# Patient Record
Sex: Male | Born: 1950 | Race: White | Hispanic: No | Marital: Married | Smoking: Former smoker
Health system: Southern US, Community
[De-identification: ages and names within clinical notes are randomized; demographics above are authoritative.]

## PROBLEM LIST (undated history)

## (undated) DIAGNOSIS — R51 Headache: Secondary | ICD-10-CM

## (undated) DIAGNOSIS — M51369 Other intervertebral disc degeneration, lumbar region without mention of lumbar back pain or lower extremity pain: Secondary | ICD-10-CM

## (undated) DIAGNOSIS — M5136 Other intervertebral disc degeneration, lumbar region: Secondary | ICD-10-CM

## (undated) DIAGNOSIS — Z8619 Personal history of other infectious and parasitic diseases: Secondary | ICD-10-CM

## (undated) DIAGNOSIS — R350 Frequency of micturition: Secondary | ICD-10-CM

## (undated) DIAGNOSIS — M5030 Other cervical disc degeneration, unspecified cervical region: Secondary | ICD-10-CM

## (undated) DIAGNOSIS — J45909 Unspecified asthma, uncomplicated: Secondary | ICD-10-CM

## (undated) DIAGNOSIS — G4733 Obstructive sleep apnea (adult) (pediatric): Secondary | ICD-10-CM

## (undated) DIAGNOSIS — I10 Essential (primary) hypertension: Secondary | ICD-10-CM

## (undated) DIAGNOSIS — M1990 Unspecified osteoarthritis, unspecified site: Secondary | ICD-10-CM

## (undated) DIAGNOSIS — E162 Hypoglycemia, unspecified: Secondary | ICD-10-CM

## (undated) DIAGNOSIS — H9193 Unspecified hearing loss, bilateral: Secondary | ICD-10-CM

## (undated) DIAGNOSIS — T884XXA Failed or difficult intubation, initial encounter: Secondary | ICD-10-CM

## (undated) DIAGNOSIS — M48061 Spinal stenosis, lumbar region without neurogenic claudication: Secondary | ICD-10-CM

## (undated) DIAGNOSIS — E039 Hypothyroidism, unspecified: Secondary | ICD-10-CM

## (undated) HISTORY — PX: CERVICAL FUSION: SHX112

## (undated) HISTORY — PX: CYSTECTOMY: SUR359

## (undated) HISTORY — PX: VASECTOMY: SHX75

## (undated) HISTORY — PX: PENILE DEBRIDEMENT: SHX2200

## (undated) HISTORY — PX: BACK SURGERY: SHX140

## (undated) HISTORY — PX: SHOULDER ARTHROSCOPY: SHX128

## (undated) HISTORY — PX: LUMBAR FUSION: SHX111

## (undated) HISTORY — PX: KNEE ARTHROSCOPY: SHX127

---

## 1989-05-29 HISTORY — PX: UVULOPALATOPHARYNGOPLASTY (UPPP)/TONSILLECTOMY/SEPTOPLASTY: SHX6164

## 1989-05-29 HISTORY — PX: TONSILLECTOMY: SUR1361

## 1995-09-29 HISTORY — PX: INGUINAL HERNIA REPAIR: SUR1180

## 1998-02-04 ENCOUNTER — Other Ambulatory Visit: Admission: RE | Admit: 1998-02-04 | Discharge: 1998-02-04 | Payer: Self-pay | Admitting: Otolaryngology

## 1999-08-15 ENCOUNTER — Encounter: Payer: Self-pay | Admitting: Endocrinology

## 1999-08-15 ENCOUNTER — Ambulatory Visit (HOSPITAL_COMMUNITY): Admission: RE | Admit: 1999-08-15 | Discharge: 1999-08-15 | Payer: Self-pay | Admitting: Endocrinology

## 1999-09-05 ENCOUNTER — Ambulatory Visit (HOSPITAL_COMMUNITY): Admission: RE | Admit: 1999-09-05 | Discharge: 1999-09-05 | Payer: Self-pay | Admitting: Endocrinology

## 1999-09-05 ENCOUNTER — Encounter: Payer: Self-pay | Admitting: Endocrinology

## 2001-04-15 ENCOUNTER — Encounter: Payer: Self-pay | Admitting: Orthopaedic Surgery

## 2001-04-15 ENCOUNTER — Encounter: Admission: RE | Admit: 2001-04-15 | Discharge: 2001-04-15 | Payer: Self-pay | Admitting: Orthopaedic Surgery

## 2001-05-20 ENCOUNTER — Encounter: Payer: Self-pay | Admitting: Neurological Surgery

## 2001-05-24 ENCOUNTER — Encounter: Payer: Self-pay | Admitting: Neurological Surgery

## 2001-05-24 ENCOUNTER — Inpatient Hospital Stay (HOSPITAL_COMMUNITY): Admission: RE | Admit: 2001-05-24 | Discharge: 2001-05-25 | Payer: Self-pay | Admitting: Neurological Surgery

## 2001-06-08 ENCOUNTER — Encounter: Payer: Self-pay | Admitting: Neurological Surgery

## 2001-06-08 ENCOUNTER — Encounter: Admission: RE | Admit: 2001-06-08 | Discharge: 2001-06-08 | Payer: Self-pay | Admitting: Neurological Surgery

## 2005-02-14 ENCOUNTER — Emergency Department (HOSPITAL_COMMUNITY): Admission: EM | Admit: 2005-02-14 | Discharge: 2005-02-14 | Payer: Self-pay | Admitting: Family Medicine

## 2006-02-08 ENCOUNTER — Encounter: Admission: RE | Admit: 2006-02-08 | Discharge: 2006-02-08 | Payer: Self-pay | Admitting: Neurological Surgery

## 2006-02-09 ENCOUNTER — Encounter: Admission: RE | Admit: 2006-02-09 | Discharge: 2006-02-09 | Payer: Self-pay | Admitting: Neurological Surgery

## 2006-04-05 ENCOUNTER — Inpatient Hospital Stay (HOSPITAL_COMMUNITY): Admission: RE | Admit: 2006-04-05 | Discharge: 2006-04-06 | Payer: Self-pay | Admitting: Neurological Surgery

## 2006-08-30 ENCOUNTER — Encounter (INDEPENDENT_AMBULATORY_CARE_PROVIDER_SITE_OTHER): Payer: Self-pay | Admitting: Specialist

## 2006-08-30 ENCOUNTER — Ambulatory Visit (HOSPITAL_COMMUNITY): Admission: RE | Admit: 2006-08-30 | Discharge: 2006-08-30 | Payer: Self-pay | Admitting: *Deleted

## 2007-07-02 ENCOUNTER — Encounter: Admission: RE | Admit: 2007-07-02 | Discharge: 2007-07-02 | Payer: Self-pay | Admitting: Orthopaedic Surgery

## 2007-07-08 ENCOUNTER — Ambulatory Visit (HOSPITAL_BASED_OUTPATIENT_CLINIC_OR_DEPARTMENT_OTHER): Admission: RE | Admit: 2007-07-08 | Discharge: 2007-07-08 | Payer: Self-pay | Admitting: Orthopaedic Surgery

## 2009-06-05 ENCOUNTER — Encounter: Admission: RE | Admit: 2009-06-05 | Discharge: 2009-06-05 | Payer: Self-pay | Admitting: Neurological Surgery

## 2010-03-31 ENCOUNTER — Emergency Department (HOSPITAL_COMMUNITY): Admission: EM | Admit: 2010-03-31 | Discharge: 2010-03-31 | Payer: Self-pay | Admitting: Emergency Medicine

## 2010-10-09 ENCOUNTER — Emergency Department (HOSPITAL_COMMUNITY)
Admission: EM | Admit: 2010-10-09 | Discharge: 2010-10-09 | Payer: Self-pay | Source: Home / Self Care | Admitting: Family Medicine

## 2010-10-10 ENCOUNTER — Inpatient Hospital Stay (HOSPITAL_COMMUNITY)
Admission: AD | Admit: 2010-10-10 | Discharge: 2010-10-21 | Payer: Self-pay | Source: Home / Self Care | Attending: Urology | Admitting: Urology

## 2010-10-13 LAB — BASIC METABOLIC PANEL
BUN: 17 mg/dL (ref 6–23)
BUN: 15 mg/dL (ref 6–23)
CO2: 28 mEq/L (ref 19–32)
CO2: 28 mEq/L (ref 19–32)
Calcium: 8.6 mg/dL (ref 8.4–10.5)
Calcium: 8.1 mg/dL — ABNORMAL LOW (ref 8.4–10.5)
Chloride: 97 mEq/L (ref 96–112)
Chloride: 100 mEq/L (ref 96–112)
Creatinine, Ser: 1.55 mg/dL — ABNORMAL HIGH (ref 0.4–1.5)
Creatinine, Ser: 1.23 mg/dL (ref 0.4–1.5)
GFR calc Af Amer: 56 mL/min — ABNORMAL LOW (ref >60)
GFR calc Af Amer: >60 mL/min (ref >60)
GFR calc non Af Amer: 46 mL/min — ABNORMAL LOW (ref >60)
GFR calc non Af Amer: >60 mL/min (ref >60)
Glucose, Bld: 107 mg/dL — ABNORMAL HIGH (ref 70–99)
Glucose, Bld: 119 mg/dL — ABNORMAL HIGH (ref 70–99)
Potassium: 4.6 mEq/L (ref 3.5–5.1)
Potassium: 4.4 mEq/L (ref 3.5–5.1)
Sodium: 133 mEq/L — ABNORMAL LOW (ref 135–145)
Sodium: 135 mEq/L (ref 135–145)

## 2010-10-13 LAB — DIFFERENTIAL
Basophils Absolute: 0 10*3/uL (ref 0.0–0.1)
Basophils Absolute: 0 10*3/uL (ref 0.0–0.1)
Basophils Absolute: 0 10*3/uL (ref 0.0–0.1)
Basophils Relative: 0 % (ref 0–1)
Basophils Relative: 0 % (ref 0–1)
Basophils Relative: 0 % (ref 0–1)
Eosinophils Absolute: 0 10*3/uL (ref 0.0–0.7)
Eosinophils Absolute: 0 10*3/uL (ref 0.0–0.7)
Eosinophils Absolute: 0.1 10*3/uL (ref 0.0–0.7)
Eosinophils Relative: 0 % (ref 0–5)
Eosinophils Relative: 0 % (ref 0–5)
Eosinophils Relative: 1 % (ref 0–5)
Lymphocytes Relative: 4 % — ABNORMAL LOW (ref 12–46)
Lymphocytes Relative: 6 % — ABNORMAL LOW (ref 12–46)
Lymphocytes Relative: 5 % — ABNORMAL LOW (ref 12–46)
Lymphs Abs: 0.8 10*3/uL (ref 0.7–4.0)
Lymphs Abs: 1.1 10*3/uL (ref 0.7–4.0)
Lymphs Abs: 0.8 10*3/uL (ref 0.7–4.0)
Monocytes Absolute: 2.1 10*3/uL — ABNORMAL HIGH (ref 0.1–1.0)
Monocytes Absolute: 1.3 10*3/uL — ABNORMAL HIGH (ref 0.1–1.0)
Monocytes Absolute: 1.2 10*3/uL — ABNORMAL HIGH (ref 0.1–1.0)
Monocytes Relative: 10 % (ref 3–12)
Monocytes Relative: 8 % (ref 3–12)
Monocytes Relative: 7 % (ref 3–12)
Neutro Abs: 17.8 10*3/uL — ABNORMAL HIGH (ref 1.7–7.7)
Neutro Abs: 14.8 10*3/uL — ABNORMAL HIGH (ref 1.7–7.7)
Neutro Abs: 14.3 10*3/uL — ABNORMAL HIGH (ref 1.7–7.7)
Neutrophils Relative %: 86 % — ABNORMAL HIGH (ref 43–77)
Neutrophils Relative %: 86 % — ABNORMAL HIGH (ref 43–77)
Neutrophils Relative %: 87 % — ABNORMAL HIGH (ref 43–77)

## 2010-10-13 LAB — CBC
HCT: 44.4 % (ref 39.0–52.0)
HCT: 44 % (ref 39.0–52.0)
HCT: 41.7 % (ref 39.0–52.0)
Hemoglobin: 15.8 g/dL (ref 13.0–17.0)
Hemoglobin: 15.4 g/dL (ref 13.0–17.0)
Hemoglobin: 14.4 g/dL (ref 13.0–17.0)
MCH: 32.8 pg (ref 26.0–34.0)
MCH: 32.4 pg (ref 26.0–34.0)
MCH: 32.1 pg (ref 26.0–34.0)
MCHC: 35.6 g/dL (ref 30.0–36.0)
MCHC: 35 g/dL (ref 30.0–36.0)
MCHC: 34.5 g/dL (ref 30.0–36.0)
MCV: 92.1 fL (ref 78.0–100.0)
MCV: 92.4 fL (ref 78.0–100.0)
MCV: 93.1 fL (ref 78.0–100.0)
Platelets: 120 10*3/uL — ABNORMAL LOW (ref 150–400)
Platelets: 105 10*3/uL — ABNORMAL LOW (ref 150–400)
Platelets: 125 10*3/uL — ABNORMAL LOW (ref 150–400)
RBC: 4.82 MIL/uL (ref 4.22–5.81)
RBC: 4.76 MIL/uL (ref 4.22–5.81)
RBC: 4.48 MIL/uL (ref 4.22–5.81)
RDW: 13.5 % (ref 11.5–15.5)
RDW: 13.4 % (ref 11.5–15.5)
RDW: 13.4 % (ref 11.5–15.5)
WBC: 20.7 10*3/uL — ABNORMAL HIGH (ref 4.0–10.5)
WBC: 17.1 10*3/uL — ABNORMAL HIGH (ref 4.0–10.5)
WBC: 16.4 10*3/uL — ABNORMAL HIGH (ref 4.0–10.5)

## 2010-10-15 LAB — CBC
HCT: 42.1 % (ref 39.0–52.0)
HCT: 39 % (ref 39.0–52.0)
Hemoglobin: 14.7 g/dL (ref 13.0–17.0)
Hemoglobin: 13.6 g/dL (ref 13.0–17.0)
MCH: 32.2 pg (ref 26.0–34.0)
MCH: 32.3 pg (ref 26.0–34.0)
MCHC: 34.9 g/dL (ref 30.0–36.0)
MCHC: 34.9 g/dL (ref 30.0–36.0)
MCV: 92.1 fL (ref 78.0–100.0)
MCV: 92.6 fL (ref 78.0–100.0)
Platelets: 130 10*3/uL — ABNORMAL LOW (ref 150–400)
Platelets: 178 10*3/uL (ref 150–400)
RBC: 4.57 MIL/uL (ref 4.22–5.81)
RBC: 4.21 MIL/uL — ABNORMAL LOW (ref 4.22–5.81)
RDW: 13.4 % (ref 11.5–15.5)
RDW: 13.7 % (ref 11.5–15.5)
WBC: 14.2 10*3/uL — ABNORMAL HIGH (ref 4.0–10.5)
WBC: 10.2 10*3/uL (ref 4.0–10.5)

## 2010-10-15 LAB — DIFFERENTIAL
Basophils Absolute: 0 10*3/uL (ref 0.0–0.1)
Basophils Absolute: 0.1 10*3/uL (ref 0.0–0.1)
Basophils Relative: 0 % (ref 0–1)
Basophils Relative: 1 % (ref 0–1)
Eosinophils Absolute: 0.3 10*3/uL (ref 0.0–0.7)
Eosinophils Absolute: 0.2 10*3/uL (ref 0.0–0.7)
Eosinophils Relative: 2 % (ref 0–5)
Eosinophils Relative: 2 % (ref 0–5)
Lymphocytes Relative: 8 % — ABNORMAL LOW (ref 12–46)
Lymphocytes Relative: 12 % (ref 12–46)
Lymphs Abs: 1.1 10*3/uL (ref 0.7–4.0)
Lymphs Abs: 1.2 10*3/uL (ref 0.7–4.0)
Monocytes Absolute: 1.1 10*3/uL — ABNORMAL HIGH (ref 0.1–1.0)
Monocytes Absolute: 1 10*3/uL (ref 0.1–1.0)
Monocytes Relative: 8 % (ref 3–12)
Monocytes Relative: 10 % (ref 3–12)
Neutro Abs: 11.7 10*3/uL — ABNORMAL HIGH (ref 1.7–7.7)
Neutro Abs: 7.7 10*3/uL (ref 1.7–7.7)
Neutrophils Relative %: 82 % — ABNORMAL HIGH (ref 43–77)
Neutrophils Relative %: 75 % (ref 43–77)

## 2010-10-15 LAB — GRAM STAIN

## 2010-10-15 LAB — GLUCOSE, CAPILLARY: Glucose-Capillary: 91 mg/dL (ref 70–99)

## 2010-10-15 LAB — VANCOMYCIN, TROUGH: Vancomycin Tr: 6.8 ug/mL — ABNORMAL LOW (ref 10.0–20.0)

## 2010-10-19 NOTE — Op Note (Signed)
NAMENASEER, HEARN NO.:  [id number]  MEDICAL RECORD NO.:  [id number]          PATIENT TYPE:  INP  LOCATION:  1420                         FACILITY:  [facility]  PHYSICIAN:  Heloise Purpura, MD      DATE OF BIRTH:  1950/12/07  DATE OF PROCEDURE:  10/13/2010 DATE OF DISCHARGE:                              OPERATIVE REPORT   PREOPERATIVE DIAGNOSIS:  Penile abscess.  POSTOPERATIVE DIAGNOSIS:  Penile abscess.  PROCEDURE:  Incision and drainage of penile abscess.  SURGEON:  Heloise Purpura, MD  ANESTHESIA:  General.  COMPLICATIONS:  None.  ESTIMATED BLOOD LOSS:  Minimal.  FINDINGS:  The patient was noted to have a large amount of gross purulence around the base of the penis and extending along the left penile shaft.  SPECIMENS:  Anaerobic and aerobic wound cultures were obtained and sent for Gram stain and culture.  INDICATION:  Mr. Hofman is a 60 year old gentleman who presented with penile and scrotal cellulitis along with an area along the right base of the penis, which suggested the initial source of infection, possibly related to an ingrown hair and shaving.  No obvious abscess or fluctuant areas were initially identified and he was admitted to the hospital for IV antibiotic therapy and observation.  His exam did not seem to significantly improve over 48 hours and a CT scan of the pelvis and genitalia was performed, which demonstrated no obvious abscess.  The following day, he was examined and purulent material was able to be expressed from the right base penile wound and it was, therefore, recommended that he proceed to the operating room for incision and drainage.  The potential risks, complications, and alternative treatment options were discussed in detail and informed consent was obtained.  DESCRIPTION OF PROCEDURE:  The patient was taken to the operating room and a general anesthetic was administered.  He was given preoperative antibiotics,  placed in the supine position, and his genitalia was prepped and draped in usual sterile fashion.  He was noted to have severe penile edema with slight breakdown of the distal penile skin. The glans penis appeared normal.  He did have a small pustule at the right base of the penis and purulent material was able to be expressed from this area.  An incision was made over the fluctuant area across the base of penis with a total incision length of approximately 1-1/2 inches.  A copious amount of gross purulent material was then returned and a wound culture was obtained for aerobic and anaerobic bacteria. Double antibiotic irrigation solution was then used to wash out the wound and there was noted be a tract that extended somewhat along the left shaft of the penis and slightly up to the suprapubic region.  Once the wound was opened and up to the point where it was felt that adequate drainage had been performed, again additional antibiotic irrigation was utilized.  A quarter-inch Penrose drain was then placed into this wound and brought out through a separate stab incision in the right groin.  3- 0 chromic sutures were then used to place loose interrupted sutures to reapproximate the skin edges of  the wound and an iodoform dressing was placed on the wound and brought out through the main incision between a couple of the interrupted sutures.  A fluff scrotal dressing was placed. The patient tolerated the procedure well without complications.  He was able to be awakened and transferred to recovery unit in satisfactory condition.     Heloise Purpura, MD     LB/MEDQ  D:  10/13/2010  T:  10/13/2010  Job:  [id number]  Electronically Signed by Heloise Purpura MD on 10/19/2010 04:54:09 PM

## 2010-10-20 LAB — CBC
HCT: 43.5 % (ref 39.0–52.0)
HCT: 46.4 % (ref 39.0–52.0)
HCT: 43.4 % (ref 39.0–52.0)
Hemoglobin: 14.5 g/dL (ref 13.0–17.0)
Hemoglobin: 15.8 g/dL (ref 13.0–17.0)
Hemoglobin: 15.6 g/dL (ref 13.0–17.0)
MCH: 31.2 pg (ref 26.0–34.0)
MCH: 31.7 pg (ref 26.0–34.0)
MCH: 32.5 pg (ref 26.0–34.0)
MCHC: 33.3 g/dL (ref 30.0–36.0)
MCHC: 34.1 g/dL (ref 30.0–36.0)
MCHC: 35.9 g/dL (ref 30.0–36.0)
MCV: 93.5 fL (ref 78.0–100.0)
MCV: 93.2 fL (ref 78.0–100.0)
MCV: 90.4 fL (ref 78.0–100.0)
Platelets: 171 10*3/uL (ref 150–400)
Platelets: 204 10*3/uL (ref 150–400)
Platelets: 201 10*3/uL (ref 150–400)
RBC: 4.65 MIL/uL (ref 4.22–5.81)
RBC: 4.98 MIL/uL (ref 4.22–5.81)
RBC: 4.8 MIL/uL (ref 4.22–5.81)
RDW: 13.7 % (ref 11.5–15.5)
RDW: 13.6 % (ref 11.5–15.5)
RDW: 13.4 % (ref 11.5–15.5)
WBC: 11.4 10*3/uL — ABNORMAL HIGH (ref 4.0–10.5)
WBC: 9.2 10*3/uL (ref 4.0–10.5)
WBC: 8.9 10*3/uL (ref 4.0–10.5)

## 2010-10-20 LAB — DIFFERENTIAL
Basophils Absolute: 0.2 10*3/uL — ABNORMAL HIGH (ref 0.0–0.1)
Basophils Absolute: 0.2 10*3/uL — ABNORMAL HIGH (ref 0.0–0.1)
Basophils Absolute: 0.2 10*3/uL — ABNORMAL HIGH (ref 0.0–0.1)
Basophils Relative: 2 % — ABNORMAL HIGH (ref 0–1)
Basophils Relative: 2 % — ABNORMAL HIGH (ref 0–1)
Basophils Relative: 2 % — ABNORMAL HIGH (ref 0–1)
Eosinophils Absolute: 0.3 10*3/uL (ref 0.0–0.7)
Eosinophils Absolute: 0.3 10*3/uL (ref 0.0–0.7)
Eosinophils Absolute: 0.3 10*3/uL (ref 0.0–0.7)
Eosinophils Relative: 3 % (ref 0–5)
Eosinophils Relative: 3 % (ref 0–5)
Eosinophils Relative: 3 % (ref 0–5)
Lymphocytes Relative: 17 % (ref 12–46)
Lymphocytes Relative: 24 % (ref 12–46)
Lymphocytes Relative: 27 % (ref 12–46)
Lymphs Abs: 1.9 10*3/uL (ref 0.7–4.0)
Lymphs Abs: 2.2 10*3/uL (ref 0.7–4.0)
Lymphs Abs: 2.4 10*3/uL (ref 0.7–4.0)
Monocytes Absolute: 1.3 10*3/uL — ABNORMAL HIGH (ref 0.1–1.0)
Monocytes Absolute: 1 10*3/uL (ref 0.1–1.0)
Monocytes Absolute: 0.8 10*3/uL (ref 0.1–1.0)
Monocytes Relative: 11 % (ref 3–12)
Monocytes Relative: 11 % (ref 3–12)
Monocytes Relative: 9 % (ref 3–12)
Neutro Abs: 7.7 10*3/uL (ref 1.7–7.7)
Neutro Abs: 5.5 10*3/uL (ref 1.7–7.7)
Neutro Abs: 5.2 10*3/uL (ref 1.7–7.7)
Neutrophils Relative %: 67 % (ref 43–77)
Neutrophils Relative %: 60 % (ref 43–77)
Neutrophils Relative %: 59 % (ref 43–77)

## 2010-10-20 LAB — CREATININE, SERUM
Creatinine, Ser: 0.87 mg/dL (ref 0.4–1.5)
GFR calc Af Amer: >60 mL/min (ref >60)
GFR calc non Af Amer: >60 mL/min (ref >60)

## 2010-10-20 LAB — CULTURE, ROUTINE-ABSCESS

## 2010-10-20 LAB — VANCOMYCIN, TROUGH: Vancomycin Tr: 14.1 ug/mL (ref 10.0–20.0)

## 2010-10-20 LAB — GLUCOSE, CAPILLARY: Glucose-Capillary: 80 mg/dL (ref 70–99)

## 2010-10-20 LAB — PATHOLOGIST SMEAR REVIEW

## 2010-10-21 LAB — DIFFERENTIAL
Basophils Absolute: 0.1 10*3/uL (ref 0.0–0.1)
Basophils Relative: 1 % (ref 0–1)
Eosinophils Absolute: 0.2 10*3/uL (ref 0.0–0.7)
Eosinophils Relative: 2 % (ref 0–5)
Lymphocytes Relative: 26 % (ref 12–46)
Lymphs Abs: 2.1 10*3/uL (ref 0.7–4.0)
Monocytes Absolute: 0.8 10*3/uL (ref 0.1–1.0)
Monocytes Relative: 10 % (ref 3–12)
Neutro Abs: 4.8 10*3/uL (ref 1.7–7.7)
Neutrophils Relative %: 61 % (ref 43–77)

## 2010-10-21 LAB — CBC
HCT: 44.2 % (ref 39.0–52.0)
Hemoglobin: 15.1 g/dL (ref 13.0–17.0)
MCH: 31 pg (ref 26.0–34.0)
MCHC: 34.2 g/dL (ref 30.0–36.0)
MCV: 90.8 fL (ref 78.0–100.0)
Platelets: 202 10*3/uL (ref 150–400)
RBC: 4.87 MIL/uL (ref 4.22–5.81)
RDW: 13.3 % (ref 11.5–15.5)
WBC: 8 10*3/uL (ref 4.0–10.5)

## 2010-10-21 LAB — ANAEROBIC CULTURE

## 2010-10-21 LAB — CREATININE, SERUM
Creatinine, Ser: 0.85 mg/dL (ref 0.4–1.5)
GFR calc Af Amer: >60 mL/min (ref >60)
GFR calc non Af Amer: >60 mL/min (ref >60)

## 2010-10-21 LAB — GLUCOSE, CAPILLARY: Glucose-Capillary: 82 mg/dL (ref 70–99)

## 2010-11-01 NOTE — Op Note (Signed)
Larry [name], ACKLIN NO.:  [id number]  MEDICAL RECORD NO.:  [id number]          PATIENT TYPE:  INP  LOCATION:  1420                         FACILITY:  [facility]  PHYSICIAN:  Heloise Purpura, MD      DATE OF BIRTH:  03/20/1951  DATE OF PROCEDURE:  10/17/2010 DATE OF DISCHARGE:                              OPERATIVE REPORT   PREOPERATIVE DIAGNOSIS:  Penile abscess.  POSTOPERATIVE DIAGNOSIS:  Penile abscess.  PROCEDURE:  Irrigation and debridement of penile abscess.  SURGEON:  Heloise Purpura, MD  ANESTHESIA:  General.  COMPLICATIONS:  None.  ESTIMATED BLOOD LOSS:  Minimal.  INDICATIONS:  [name] is a gentleman who had been admitted to the hospital and found to have a penile abscess.  He underwent an initial incision and drainage procedure earlier this week and his wound culture subsequently returned positive for methicillin-resistant Staphylococcus aureus.  He has been on appropriate antibiotic therapy with vancomycin and has been making gradual progress, although it was felt that he would benefit from further irrigation and debridement of his abscess and wound.  The potential risks, complications, and alternative treatment options were discussed in detail and informed consent was obtained.  DESCRIPTION OF PROCEDURE:  The patient was taken to the operating room and a general anesthetic was administered.  He was given preoperative antibiotics, placed in the dorsal lithotomy position, and prepped and draped in the usual sterile fashion.  Preoperative time-out was performed.  The patient's wound located on the dorsal aspect toward the base of the penis was examined.  It was noted that he was fairly indurated over his right suprapubic region just above this area and also had significant edema around the distal penile shaft circumferentially with areas of eschar formation and underlying fibrinous tissue.  The original wound was therefore extended up  toward the right suprapubic region an additional inch and a half and no further pockets of purulent material were able to be identified.  There was noted to be extension of the initial abscess cavity down the left shaft of the penis and appeared that this did communicate with the fibrinous areas along the distal shaft.  The area toward the left distal shaft of the penis with eschar formation was examined and this eschar was carefully removed.  The underlying fibrinous material was excised down to healthy bleeding tissue.  Hemostasis was achieved and was noted this area again did communicate with the abscess cavity.  Other fibrinous areas along the distal shaft were debrided, although did not require nearly as extensive debridement as the left distal aspect of the penis. To facilitate cosmetic healing, this area of the distal shaft was reapproximated with loosely placed interrupted 3-0 chromic sutures.  An iodoform dressing was placed in between the sutures into this aspect of the wound and this was all performed following double antibiotic irrigation of this area.  The original wound toward the dorsal base of the penis was also heavily irrigated with double antibiotic irrigation with any fibrinous material removed.  This wound was then packed with a 4x4 gauze normal saline dressing.  The patient tolerated the procedure well and without complications.  He was able to be extubated and transferred to the recovery unit in satisfactory condition.     Heloise Purpura, MD     LB/MEDQ  D:  10/19/2010  T:  10/20/2010  Job:  [id number]  Electronically Signed by Heloise Purpura MD on 11/01/2010 06:26:12 AM

## 2010-11-01 NOTE — Discharge Summary (Signed)
NAMEMARCELUS, [name] NO.:  [id number]  MEDICAL RECORD NO.:  [id number]          PATIENT TYPE:  INP  LOCATION:  1420                         FACILITY:  [facility]  PHYSICIAN:  Heloise Purpura, MD      DATE OF BIRTH:  October 15, 1950  DATE OF ADMISSION:  10/10/2010 DATE OF DISCHARGE:  10/21/2010                              DISCHARGE SUMMARY   REASON FOR ADMISSION:  Penile cellulitis and abscess.  DISCHARGE DIAGNOSIS:  Penile cellulitis and abscess.  HISTORY:  Mr. Larry [name] is a 60 year old gentleman who presented to the office on October 10, 2010, with an edematous, erythematous, and painful penile shaft and suprapubic region.  He did have a wound on the dorsal right base of the penis which apparently occurred after shaving his genitalia.  No obvious fluctuant areas were identified that appeared to indicate an abscess or required drainage.  It was felt that he had a cellulitis at that time and was admitted on broad-spectrum IV antibiotic therapy and for observation.  His white blood count gradually improved, although he continued to have concerning physical exam findings. Approximately 2 days later, he did undergo a CT scan of the pelvis which did not demonstrate any clear abscess formation requiring drainage. However, on Monday, it was felt that his exam was mildly worse and it was recommended that he proceed to the operating room for incision, drainage and debridement of his penile wound.  At that time, he was found to have a clear abscess with purulent drainage from dorsal base of the penis and a small area was drained.  This area was initially reapproximated with a Penrose drain placed.  No additional abscess areas were clearly visible.  Over the course of the next few days, the patient's exam did not change markedly, although his clinical parameters including his white blood count and his fever curve continued to improve.  His stitches were therefore removed,  allowing the prior incision site to be opened and it was packed.  His exam mildly improved over the course of the next 48 hours and he was taken back to the operating room on October 17, 2010, for further irrigation and debridement.  In addition, the distal aspect of the penis appeared to be more edematous with some eschar formation.  No additional abscess pockets were identified.  However, clear communication was noted between the initial dorsal wound and the distal penile area.  Eschar and fibrinous material was excised with reapproximation of this area and drainage of this area as well.  The initial dorsal incision over the base of the penis was extended slightly up in the suprapubic region where he continued to have induration and erythema on the right side. No additional purulent material was identified but this did allow for improved packing of the wound.  He was continued on wet-to-dry dressing changes.  His culture result subsequently returned positive for MRSA sensitive to vancomycin which he had been on since hospital admission. Over the next few days, he continued to necessitate IV pain control.  He was subsequently transitioned from IV vancomycin oral trimethoprim/sulfamethoxazole.  He continued top undergo wet-to-dry dressing changes and  his clinical exam continued to improve.  He remained afebrile and by October 21, 2010, he was able to tolerate dressing changes with oral pain medication.  It was felt that he could proceed with outpatient therapy at this point.  I gave him the option of home health care versus having his wife perform dressing changes.  He preferred that his wife perform his dressing changes and therefore she underwent teaching and he was able to be discharged home.  DISPOSITION:  Home.  DISCHARGE MEDICATIONS:  He was instructed to resume his regular home medications.  In addition, he was given a prescription to take Percocet as needed for pain and also  instructed to take Bactrim DS 1 tablet p.o. b.i.d. for 1 week.  DISCHARGE INSTRUCTIONS:  He and his wife were instructed on local wound care and instructed to perform normal saline wet-to-dry dressing changes b.i.d..  He was instructed to call should he develop fever or signs of worsening infection which were explained to him in detail.  He was instructed to resume his regular diet and activity as tolerated, although recommended to refrain from any driving or strenuous activity/exercise.  FOLLOWUP:  He will follow up with me in approximately 1 week for further clinical evaluation and to assess his progress.     Heloise Purpura, MD     LB/MEDQ  D:  10/21/2010  T:  10/21/2010  Job:  [id number]  Electronically Signed by Heloise Purpura MD on 11/01/2010 06:26:06 AM

## 2010-11-26 ENCOUNTER — Other Ambulatory Visit: Payer: Self-pay | Admitting: Anesthesiology

## 2010-11-26 ENCOUNTER — Ambulatory Visit (HOSPITAL_COMMUNITY)
Admission: RE | Admit: 2010-11-26 | Discharge: 2010-11-26 | Disposition: A | Discharge status: Home / Self Care | Payer: BC Managed Care – PPO | Source: Ambulatory Visit | Attending: Urology | Admitting: Urology

## 2010-11-26 ENCOUNTER — Other Ambulatory Visit (HOSPITAL_COMMUNITY): Payer: Self-pay | Admitting: Urology

## 2010-11-26 ENCOUNTER — Encounter (HOSPITAL_COMMUNITY): Payer: BC Managed Care – PPO

## 2010-11-26 DIAGNOSIS — R05 Cough: Secondary | ICD-10-CM | POA: Insufficient documentation

## 2010-11-26 DIAGNOSIS — N4829 Other inflammatory disorders of penis: Secondary | ICD-10-CM | POA: Insufficient documentation

## 2010-11-26 DIAGNOSIS — R0989 Other specified symptoms and signs involving the circulatory and respiratory systems: Secondary | ICD-10-CM | POA: Insufficient documentation

## 2010-11-26 DIAGNOSIS — Z01818 Encounter for other preprocedural examination: Secondary | ICD-10-CM

## 2010-11-26 DIAGNOSIS — R059 Cough, unspecified: Secondary | ICD-10-CM | POA: Insufficient documentation

## 2010-11-26 DIAGNOSIS — Z01812 Encounter for preprocedural laboratory examination: Secondary | ICD-10-CM | POA: Insufficient documentation

## 2010-11-26 LAB — BASIC METABOLIC PANEL
BUN: 16 mg/dL (ref 6–23)
CO2: 29 mEq/L (ref 19–32)
Calcium: 9.1 mg/dL (ref 8.4–10.5)
Chloride: 103 mEq/L (ref 96–112)
Creatinine, Ser: 0.78 mg/dL (ref 0.4–1.5)
GFR calc Af Amer: >60 mL/min (ref >60)
GFR calc non Af Amer: >60 mL/min (ref >60)
Glucose, Bld: 88 mg/dL (ref 70–99)
Potassium: 3.6 mEq/L (ref 3.5–5.1)
Sodium: 140 mEq/L (ref 135–145)

## 2010-11-26 LAB — SURGICAL PCR SCREEN
MRSA, PCR: NEGATIVE
Staphylococcus aureus: NEGATIVE

## 2010-11-28 ENCOUNTER — Ambulatory Visit (HOSPITAL_COMMUNITY)
Admission: RE | Admit: 2010-11-28 | Discharge: 2010-11-28 | Disposition: A | Discharge status: Home / Self Care | Payer: BC Managed Care – PPO | Source: Ambulatory Visit | Attending: Urology | Admitting: Urology

## 2010-11-28 DIAGNOSIS — N4829 Other inflammatory disorders of penis: Secondary | ICD-10-CM | POA: Insufficient documentation

## 2010-11-28 DIAGNOSIS — E119 Type 2 diabetes mellitus without complications: Secondary | ICD-10-CM | POA: Insufficient documentation

## 2010-11-28 DIAGNOSIS — Z8614 Personal history of Methicillin resistant Staphylococcus aureus infection: Secondary | ICD-10-CM | POA: Insufficient documentation

## 2010-11-29 NOTE — Op Note (Signed)
  [name], Larry [name] NO.:  [id number]  MEDICAL RECORD NO.:  [id number]           PATIENT TYPE:  O  LOCATION:  DAYL                         FACILITY:  [facility]  PHYSICIAN:  Heloise Purpura, MD      DATE OF BIRTH:  Aug 21, 1951  DATE OF PROCEDURE:  11/28/2010 DATE OF DISCHARGE:                              OPERATIVE REPORT   PREOPERATIVE DIAGNOSIS:  Penile abscess.  POSTOPERATIVE DIAGNOSIS:  Penile abscess.  PROCEDURES: 1. Irrigation and debridement of penile wound and abscess. 2. Closure of penile wound.  SURGEON:  Heloise Purpura, M.D.  ANESTHESIA:  General.  COMPLICATIONS:  None.  INDICATION:  [name] is a 60 year old gentleman who was admitted to the hospital in January 2012 for a large penile abscess requiring incision and drainage.  This was subsequently positive for MRSA, and he underwent treatment with vancomycin and subsequently Bactrim.  He had undergone multiple irrigation and debridement procedures while hospitalized and follows up today after having received local wound care and antibiotic therapy for further debridement and the possibility for wound closure.  We discussed the potential risks and complications of this procedure and he gave his informed consent.  DESCRIPTION OF PROCEDURE:  The patient was taken to the operating room, and a general anesthetic was administered.  He was given preoperative antibiotics, placed in the supine position and prepped and draped in the usual sterile fashion.  Next, his wound was examined.  His right dorsal penile wound toward the base has completely healed at this time.  He does have a moderate amount of scar tissue in this particular area.  In the distal aspect of the penis, his other wound appears to be healing well.  He does have significant fibrinous tissue over the ventral right portion of the wound and the left lateral wound appeared clean with healthy granulation tissue.  The fibrinous tissue was  sharply debrided down to bleeding skin edges.  Hemostasis was achieved with electrocautery.  There was a skin bridge at the ventral aspect of the penis at approximately 6 o'clock.  This was incised and opened releasing some scar tissue.  The remainder of the wound was then irrigated with antibiotic irrigation.  Hemostasis was again ensured, and the skin edges were reapproximated with interrupted 3-0 chromic sutures.  A dressing was then applied and the procedure ended. He tolerated the procedure well without complications.  He was able to be awakened and transferred to recovery unit in satisfactory condition.     Heloise Purpura, MD     LB/MEDQ  D:  11/28/2010  T:  11/28/2010  Job:  [id number]  Electronically Signed by Heloise Purpura MD on 11/29/2010 04:54:09 AM

## 2011-02-10 NOTE — Op Note (Signed)
Larry [name], Larry [name] NO.:  [id number]   MEDICAL RECORD NO.:  [id number]          PATIENT TYPE:  AMB   LOCATION:  DSC                          FACILITY:  [facility]   PHYSICIAN:  Claude Manges. Whitfield, M.D.DATE OF BIRTH:  March 29, 1951   DATE OF PROCEDURE:  07/08/2007  DATE OF DISCHARGE:                               OPERATIVE REPORT   PREOPERATIVE DIAGNOSES:  1. Impingement, right shoulder.  2. 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  3. Partial rotator cuff tear.   POSTOPERATIVE DIAGNOSES:  1. Impingement, right shoulder.  2. 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  3. Partial rotator cuff tear.   PROCEDURES:  1. Arthroscopic debridement of rotator cuff tear.  2. Arthroscopic subacromial decompression.  3. Arthroscopic distal clavicle resection.   SURGEON:  Claude Manges. Cleophas Dunker, M.D.   ASSISTANT:  Richardean Canal, P.A.   ANESTHESIA:  General with interscalene block.   COMPLICATIONS:  None.   HISTORY:  A 60 year old gentleman who has been followed over a period of  months for problems referable to his right shoulder.  He has had a  course of excises and antiinflammatory medicines, but continues to have  pain to the point of compromise.  He has had an MRI scan that was  consistent with a high grade partial versus possible small full  thickness nonretracted insertional tear of the distal supraspinatus  tendon associated with moderate AC joint hypertrophy.  There was no  evidence of labral or biceps tear.  He wishes to proceed with  arthroscopic evaluation.   PROCEDURE:  With the patient comfortable on the operating room table and  under general orotracheal anesthesia, the patient was placed in a  semisitting position with a shoulder frame.  He did receive a  preoperative interscalene nerve block with excellent anesthesia to the  right upper extremity.   Examination under the anesthesia revealed no evidence of adhesive  capsulitis or  instability.   The right shoulder was then prepped with DuraPrep in the base of the  neck circumferentially above the elbow, sterile draping was performed.  Marking pen was used to outline the [facility] joint, the coracoid and the  acromion.  At a point a fingerbreadth inferior and medial to the  posterior angle of the acromion, a small stab wound was made and the  arthroscope easily placed into the shoulder joint.  There was no  effusion.  Diagnostic arthroscopy revealed some fraying of the anterior  glenoid labrum superior.  There was some mild chondromalacia of the  glenoid but not the humeral head.  There were no loose bodies.  There  was maybe a very minimal partial tear of the subscapularis tendon.  The  biceps tendon was intact.  There was a fairly extensive partial tearing  of the rotator cuff at the supraspinatus insertion on the humeral head.  A second portal was established anteriorly with a cannula and shaving of  the partial rotator cuff tear was performed.  I also debrided the  anterior and superior labrum.   The arthroscope was then placed to subacromial space posteriorly, the  cannula subacromial space anteriorly and  a third portal established in a  lateral subacromial space.  An arthroscopic subacromial decompression  was performed, removing moderate bursal tissue.  There was obvious  anterior overhang of the acromion and anterior, inferior and lateral  acromioplasty was performed with a 6-mm bur with a very nice  decompression.  There was obvious degenerative change and irregularity  of the distal clavicle with some synovitis.  Soft tissue was debrided  with the ArthroCare wand and distal clavicle resection was performed  with the 6-mm bur with a very nice resection.   The anterior and lateral wounds were then closed with interrupted 4-0  Ethilon.  A sterile bulky dressing was applied followed by a sling.   PLAN:  Vicodin for pain, office 1 week.      Claude Manges.  Cleophas Dunker, M.D.  Electronically Signed     PWW/MEDQ  D:  07/08/2007  T:  07/08/2007  Job:  [id number]

## 2011-02-13 NOTE — Op Note (Signed)
[place]  Patient:    ISAIC, SYLER Visit Number: [id number] MRN: [id number]          Service Type: SUR Location: [id number] Attending Physician:  Jonne Ply Proc. Date: 05/24/01 Adm. Date:  05/24/2001 Disc. 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  OPERATION PERFORMED:  Anterior cervical diskectomy and arthrodesis C5-6 and C6-7.  SURGEON:  Stefani Dama, M.D.  ASSISTANT:  Cristi Loron, M.D.  ANESTHESIA:  General endotracheal.  INDICATIONS FOR PROCEDURE:  The patient is a 60 year old individual who has had significant neck, shoulder and arm pain with weakness in the left arm.  He was found to have significant spondylitic disease on top of what appears to be an acute disk herniation at C5-6 on the left side.  He was found to be weak in the left arm and because his symptoms have been consistent with limited range of motion in the neck and weakness in the arm, he was advised regarding surgical decompression and stabilization via a 2-level anterior diskectomy and arthrodesis.  DESCRIPTION OF PROCEDURE:  The patient was brought to the operating room and placed on the table in supine position.  After smooth induction of general endotracheal anesthesia, the neck was shaved and prepped with DuraPrep and draped in a sterile fashion.  5 pounds of halter traction were used. Transverse incision was made on the left side of the neck.  This was carried down to through the platysma.  The plane between the sternocleidomastoid and the strap muscles was dissected bluntly until the prevertebral space was reached.  The first identifiable disk space was noted to be that of C5-6 on a localizing radiograph.  Dissection  was then carried into the longus colli muscle cephalad and inferiorly to allow placement of some Caspar self-retaining retractors.  A diskectomy was then performed at C5-6 by removing large ventral osteophyte and then opening the disk space and removing a marked amount of significantly degenerated disk material from within the C5-6 disk space.  As the disk space was opened further, the posterior longitudinal ligament region was identified.  Osteophytes were found from the superior margin of body of C6 and inferior margin of the body of C7.  Large uncinate spurs were also encountered.  A 2.3 mm dissecting tool on a high speed bur was used to drill down the osteophytes and opened up the lateral recesses first on the right side, then on the left.  Large uncinate spurs were removed separately. The common dural tube and the take off of the C6 nerve root on the right and then on the left were well decompressed.  Hemostasis from the epidural veins was obtained with bipolar cautery and some small pledgets of Gelfoam soaked in thrombin later irrigated away.  A 7 mm tricortical graft was placed with the tricortical surface facing dorsally. Attention was then turned to C6-7 where a similar procedure was carried out again doing a complete diskectomy and removing the large uncinate spurs both centrally and in the uncinate process region.  A 7 mm graft was then placed into this space in the same fashion.  Care was taken to maintain hemostasis in the epidural spaces  and this was obtained meticulously.  Then a 37 mm curved plate was secured to the anterior portions of C5, C6 and C7 with locking 4 x 14 mm screws.  A localizing radiograph identified good position of the hardware and the grafts.  The area was then copiously irrigated with antibiotic irrigating solution and then the platysma was closed with 3-0 Vicryl in interrupted fashion.  3-0 Vicryl was used in the subcuticular skin. The patient tolerated  the surgery well and was taken to the recovery room in stable condition. Attending Physician:  Jonne Ply DD:  05/24/01 TD:  05/25/01 Job: [id number]

## 2011-02-13 NOTE — Op Note (Signed)
NAMEZAYDRIAN, BATTA NO.:  [id number]   MEDICAL RECORD NO.:  [id number]          PATIENT TYPE:  AMB   LOCATION:  ENDO                         FACILITY:  [facility]   PHYSICIAN:  Georgiana Spinner, M.D.    DATE OF BIRTH:  1950-12-30   DATE OF PROCEDURE:  08/30/2006  DATE OF DISCHARGE:                               OPERATIVE REPORT   SURGEON:  Georgiana Spinner, M.D.   PROCEDURE:  Colonoscopy.   INDICATIONS:  Colon polyp.   ANESTHESIA:  Fentanyl 75 mcg, Versed 7.5 mg.   DESCRIPTION OF PROCEDURE:  With the patient mildly sedated in the left  lateral decubitus position, a rectal examination was performed, which  was unremarkable.  Subsequently, the Olympus videoscopic colonoscope was  inserted into the rectum and passed under direct vision to the cecum,  identified by the ileocecal valve and appendiceal orifice, both of which  were photographed.  From this point, the colonoscope was slowly  withdrawn, taking circumferential views of colonic mucosa, stopping at  approximately 20 cm from the anal verge, at which point a polyp was  seen, photographed and removed using hot biopsy forceps at a setting of  20/200 blended current.  We next pulled back all the way to the rectum,  which appeared normal on direct, and showed hemorrhoids on retroflex  view.  The endoscope was then straightened and withdrawn.  The patient's  vital signs and pulse oximetry remained stable.  The patient tolerated  the procedure well and without apparent complications.   FINDINGS:  Polyp at 20 cm; otherwise, an unremarkable examination, other  than internal hemorrhoids.   PLAN:  Await biopsy report.  The patient will call me for results and  follow up with me as an outpatient.           ______________________________  Georgiana Spinner, M.D.     GMO/MEDQ  D:  08/30/2006  T:  08/30/2006  Job:  [id number]

## 2011-02-13 NOTE — Op Note (Signed)
NAMEKELLIE, CHISOLM NO.:  [id number]   MEDICAL RECORD NO.:  [id number]          PATIENT TYPE:  INP   LOCATION:  3172                         FACILITY:  [facility]   PHYSICIAN:  Stefani Dama, M.D.  DATE OF BIRTH:  08-25-1951   DATE OF PROCEDURE:  04/05/2006  DATE OF DISCHARGE:                                 OPERATIVE REPORT   PREOPERATIVE DIAGNOSIS:  Herniated nucleus pulposus, L4-L5 with left lumbar  radiculopathy, chronic back pain and spondylosis.   POSTOPERATIVE DIAGNOSIS:  Herniated nucleus pulposus, L4-L5 with left lumbar  radiculopathy, chronic back pain and spondylosis.   PROCEDURES PERFORMED:  Bilateral diskectomy, posterior interbody arthrodesis  with PEEK bone spacer, local autograft and allograft, posterolateral  arthrodesis with local autograft and allograft, pedicle screw fixation L4-  L5.   SURGEON:  Dr. Barnett Abu   FIRST ASSISTANT:  Dr. Jeral Fruit.   ANESTHESIA:  General endotracheal.   INDICATIONS:  Humzah Harty is a 60 year old individual who has had  significant back and left lower extremity pain.  His chronic back pain has  been treated conservatively for about 2 years period of time.  Over the past  6 months he has been evaluated in our office and has undergone various  conservative measures note which have been successful.  After careful  consideration he was advised regarding surgical decompression arthrodesis at  the L4-L5 level.   PROCEDURE:  The patient was brought to the operating room supine on the  stretcher.  After smooth induction of general endotracheal anesthesia he was  turned prone.  The back was prepped with DuraPrep and draped in sterile  fashion.  Midline incision was created, carried down to the lumbodorsal  fascia.  The first identifiable spinous process was noted to be that of L4  on the radiograph.  Dissection was then carried down the interlaminar spaces  in a subperiosteal fashion at L4-L5.  Self-retaining  retractor was then  placed in the wound to expose the interlaminar space at L4 and L5 out to the  facette joint capsules.  The capsules were then opened and a laminotomy was  created removing inferior margin lamina of L4 out to and including the  entirety of the facette at L4-L5.  This inferior articular process being  identified, the yellow ligament was taken up and then a 2 to 3 mm Kerrison  punch were then used to remove the remnants of the yellow ligament and  expose common dural tube and what was noted to be rather bowed and tense L5  nerve root.  The L5 nerve root was then dissected free and retracted  medially and diskectomy was undertaken on the right side at the L4-L5 level.  Once this was accomplished a similar procedure was carried out on the left  side, thus allowing for complete diskectomy obtained both laterally and  medially in the disk space and across the midline.  Once diskectomy was  completed then the endplates were curettaged until bony bleeding was  encountered in the endplates.  The interspace was sized and a series of disk  shapers were used to enlarge this  opening into a 10 meter size.  An 11 mm  trial PLIF PEEK spacer was then placed first on the right side.  This fit  snugly and it was decided to use bilateral 25 mm long, 11 mm tall PEEK  spacers.  These were filled with combination of the autograft that had been  removed, some Osteocel material that was placed in a mixture and then packed  into the cage itself.  The cage on the right side was first placed and then  the interspace was packed and ultimately the cage on the left side was  placed.  Once the cages were placed, the fluoroscopy unit was brought into  the field and localization of the PEEK spacers was obtained and pedicle  entry sites were then chosen L4 and L5 superiorly.  The L4 holes were tapped  for 6.5 mm tap with a 45 mm screw being placed at L4, 45 mm x 6.5 mm screws  were placed in L5.  These  were then checked radiographically.  Each of the  holes were sounded and found to be intact without any cutout superiorly,  medially or inferiorly.  The fluoroscopy unit was then removed and 40 mm  precontoured rods were then placed between the pedicle screw heads and the  system was tightened down in a neutral construct.  The remainder of bone  graft was then packed into the lateral gutters underneath the pedicle screw  sites.  Then the wound was copiously irrigated with antibiotic irrigating  solution.  Hemostasis in the soft tissues was checked carefully and the  lumbodorsal fascia was closed with #1 Vicryl in interrupted fashion, 2-0  Vicryl was used in subcutaneous tissues, 3-0 Vicryl subcuticularly.  Dermabond was placed on the skin.  The patient tolerated procedure well adn  was returned to recovery room in stable condition.      Stefani Dama, M.D.  Electronically Signed     HJE/MEDQ  D:  04/05/2006  T:  04/05/2006  Job:  [id number]

## 2011-05-12 ENCOUNTER — Other Ambulatory Visit: Payer: Self-pay | Admitting: Neurological Surgery

## 2011-05-12 DIAGNOSIS — M542 Cervicalgia: Secondary | ICD-10-CM

## 2011-05-12 DIAGNOSIS — M5412 Radiculopathy, cervical region: Secondary | ICD-10-CM

## 2011-05-13 ENCOUNTER — Ambulatory Visit
Admission: RE | Admit: 2011-05-13 | Discharge: 2011-05-13 | Disposition: A | Discharge status: Home / Self Care | Payer: BC Managed Care – PPO | Source: Ambulatory Visit | Attending: Neurological Surgery | Admitting: Neurological Surgery

## 2011-05-13 DIAGNOSIS — M5412 Radiculopathy, cervical region: Secondary | ICD-10-CM

## 2011-05-13 DIAGNOSIS — M542 Cervicalgia: Secondary | ICD-10-CM

## 2011-07-09 LAB — BASIC METABOLIC PANEL
BUN: 9
CO2: 30
Calcium: 9.5
Chloride: 101
Creatinine, Ser: 0.72
GFR calc Af Amer: >60
GFR calc non Af Amer: >60
Glucose, Bld: 124 — ABNORMAL HIGH
Potassium: 4.3
Sodium: 139

## 2011-07-09 LAB — POCT HEMOGLOBIN-HEMACUE
Hemoglobin: 17.9 — ABNORMAL HIGH
Operator id: 123881

## 2013-03-08 ENCOUNTER — Other Ambulatory Visit: Payer: Self-pay | Admitting: Neurological Surgery

## 2013-03-08 DIAGNOSIS — M5416 Radiculopathy, lumbar region: Secondary | ICD-10-CM

## 2013-03-14 ENCOUNTER — Ambulatory Visit
Admission: RE | Admit: 2013-03-14 | Discharge: 2013-03-14 | Disposition: A | Discharge status: Home / Self Care | Payer: BC Managed Care – PPO | Source: Ambulatory Visit | Attending: Neurological Surgery | Admitting: Neurological Surgery

## 2013-03-14 DIAGNOSIS — M5416 Radiculopathy, lumbar region: Secondary | ICD-10-CM

## 2013-03-14 MED ORDER — GADOBENATE DIMEGLUMINE 529 MG/ML IV SOLN
16.0000 mL | Freq: Once | INTRAVENOUS | Status: AC | PRN
  Administered 2013-03-14: 16 mL via INTRAVENOUS

## 2013-03-17 ENCOUNTER — Other Ambulatory Visit: Payer: Self-pay | Admitting: Neurological Surgery

## 2013-03-18 ENCOUNTER — Other Ambulatory Visit: Payer: BC Managed Care – PPO

## 2013-03-29 ENCOUNTER — Encounter (HOSPITAL_COMMUNITY): Payer: Self-pay | Admitting: Pharmacy Technician

## 2013-04-03 ENCOUNTER — Encounter (HOSPITAL_COMMUNITY): Payer: Self-pay

## 2013-04-03 ENCOUNTER — Encounter (HOSPITAL_COMMUNITY)
Admission: RE | Admit: 2013-04-03 | Discharge: 2013-04-03 | Disposition: A | Discharge status: Home / Self Care | Payer: BC Managed Care – PPO | Source: Ambulatory Visit | Attending: Neurological Surgery | Admitting: Neurological Surgery

## 2013-04-03 DIAGNOSIS — Z01812 Encounter for preprocedural laboratory examination: Secondary | ICD-10-CM | POA: Insufficient documentation

## 2013-04-03 HISTORY — DX: Failed or difficult intubation, initial encounter: T88.4XXA

## 2013-04-03 HISTORY — DX: Unspecified osteoarthritis, unspecified site: M19.90

## 2013-04-03 HISTORY — DX: Hypothyroidism, unspecified: E03.9

## 2013-04-03 HISTORY — DX: Headache: R51

## 2013-04-03 LAB — BASIC METABOLIC PANEL
BUN: 14 mg/dL (ref 6–23)
CO2: 28 mEq/L (ref 19–32)
Calcium: 9.4 mg/dL (ref 8.4–10.5)
Chloride: 102 mEq/L (ref 96–112)
Creatinine, Ser: 0.75 mg/dL (ref 0.50–1.35)
GFR calc Af Amer: >90 mL/min (ref >90)
GFR calc non Af Amer: >90 mL/min (ref >90)
Glucose, Bld: 87 mg/dL (ref 70–99)
Potassium: 3.9 mEq/L (ref 3.5–5.1)
Sodium: 140 mEq/L (ref 135–145)

## 2013-04-03 LAB — CBC
HCT: 47.5 % (ref 39.0–52.0)
Hemoglobin: 17.4 g/dL — ABNORMAL HIGH (ref 13.0–17.0)
MCH: 32.5 pg (ref 26.0–34.0)
MCHC: 36.6 g/dL — ABNORMAL HIGH (ref 30.0–36.0)
MCV: 88.8 fL (ref 78.0–100.0)
Platelets: 169 10*3/uL (ref 150–400)
RBC: 5.35 MIL/uL (ref 4.22–5.81)
RDW: 13 % (ref 11.5–15.5)
WBC: 7.7 10*3/uL (ref 4.0–10.5)

## 2013-04-03 LAB — SURGICAL PCR SCREEN
MRSA, PCR: NEGATIVE
Staphylococcus aureus: NEGATIVE

## 2013-04-03 LAB — TYPE AND SCREEN
ABO/RH(D): A POS
Antibody Screen: NEGATIVE

## 2013-04-03 NOTE — Pre-Procedure Instructions (Signed)
Larry [name]  04/03/2013   Your procedure is scheduled on:   Tuesday  04/11/13  Report to [facility] at 945 AM.  Call this number if you have problems the morning of surgery: [phone]   Remember:   Do not eat food or drink liquids after midnight.   Take these medicines the morning of surgery with A SIP OF WATER:  NEURONTIN(GABAPENTIN), HYDROCODONE IF NEEDED FOR PAIN, SYNTHROID, LORAZEPAM   (STOP ASPIRIN, COUMADIN, PLAVIX, EFFIENT, HERBAL MEDICINES, RELAFIN , MOBIC)   Do not wear jewelry, make-up or nail polish.  Do not wear lotions, powders, or perfumes. You may wear deodorant.  Do not shave 48 hours prior to surgery. Men may shave face and neck.  Do not bring valuables to the hospital.  [facility] is not responsible                   for any belongings or valuables.  Contacts, dentures or bridgework may not be worn into surgery.  Leave suitcase in the car. After surgery it may be brought to your room.  For patients admitted to the hospital, checkout time is 11:00 AM the day of  discharge.   Patients discharged the day of surgery will not be allowed to drive  home.  Name and phone number of your driver:  Special Instructions: Shower using CHG 2 nights before surgery and the night before surgery.  If you shower the day of surgery use CHG.  Use special wash - you have one bottle of CHG for all showers.  You should use approximately 1/3 of the bottle for each shower.   Please read over the following fact sheets that you were given: Pain Booklet, Coughing and Deep Breathing, Blood Transfusion Information, MRSA Information and Surgical Site Infection Prevention

## 2013-04-03 NOTE — Progress Notes (Signed)
Revonda Standard , PA to review chart in addition to abnormal hemoglobin level ( 17.4).

## 2013-04-04 NOTE — Progress Notes (Signed)
Anesthesia Note:  Patient is a 62 year old male scheduled for L3-4 PLIF on 04/11/13 by Dr. Danielle Dess.  History includes DIFFICULT INTUBATION (felt related to limited neck movement and anterior larynx), former smoker, arthritis, headaches (on prophylactic verapamil). He is s/p multiple I&D for penile abscess '12, L4-5 posterior interbody arthrodesis '07, right shoulder arthroscopy '08, C4-5 ACDF 06/18/11. PCP is Dr. Juleen China.  He denied known history of HTN, DM, CAD/MI, CHF, chest pain, SOB.  Patient reports he was told he was a difficult airway following his 2007 lumbar fusion.  He has had several surgeries since, and denies history of an awake intubation.  On 04/05/06 [facility]) he was noted to be an unexpected difficult airway due to anterior larynx and glidescope was utilized.  On 07/08/2007 [facility]) a glidescope with Eschmann stylet were used. At [facility] in 2012 bag mask ventilation was difficult. His airway was ultimately secured asleep, using a Miller 3 blade (Bougie passed then 7.5 ETT). A LMA was used in his two subsequent surgeries. On 06/18/11 [facility]) a Lorri Frederick was utilized.  Exam shows a pleasant Caucasian male in NAD.  Heart RRR, no murmur noted.  Lungs clear.  No significant edema noted.  Limited neck movement.  Good mouth opening.    Preoperative labs noted.  He will be evaluated by his assigned anesthesiologist on the day of surgery to discuss the definitive anesthesia plan.  Velna Ochs [facility]/Anesthesiology Phone [phone] 04/04/2013 12:14 PM

## 2013-04-10 MED ORDER — CEFAZOLIN SODIUM-DEXTROSE 2-3 GM-% IV SOLR
2.0000 g | INTRAVENOUS | Status: AC
  Administered 2013-04-11: 2 g via INTRAVENOUS
  Filled 2013-04-10: qty 50

## 2013-04-11 ENCOUNTER — Inpatient Hospital Stay (HOSPITAL_COMMUNITY)
Admission: RE | Admit: 2013-04-11 | Discharge: 2013-04-13 | DRG: 756 | Disposition: A | Discharge status: Home / Self Care | Payer: BC Managed Care – PPO | Source: Ambulatory Visit | Attending: Neurological Surgery | Admitting: Neurological Surgery

## 2013-04-11 ENCOUNTER — Inpatient Hospital Stay (HOSPITAL_COMMUNITY): Payer: BC Managed Care – PPO | Admitting: Anesthesiology

## 2013-04-11 ENCOUNTER — Encounter (HOSPITAL_COMMUNITY): Payer: Self-pay | Admitting: *Deleted

## 2013-04-11 ENCOUNTER — Encounter (HOSPITAL_COMMUNITY)
Admission: RE | Disposition: A | Discharge status: Home / Self Care | Payer: Self-pay | Source: Ambulatory Visit | Attending: Neurological Surgery

## 2013-04-11 ENCOUNTER — Encounter (HOSPITAL_COMMUNITY): Payer: Self-pay | Admitting: Vascular Surgery

## 2013-04-11 ENCOUNTER — Inpatient Hospital Stay (HOSPITAL_COMMUNITY): Payer: BC Managed Care – PPO

## 2013-04-11 DIAGNOSIS — E039 Hypothyroidism, unspecified: Secondary | ICD-10-CM | POA: Diagnosis present

## 2013-04-11 DIAGNOSIS — M48061 Spinal stenosis, lumbar region without neurogenic claudication: Principal | ICD-10-CM | POA: Diagnosis present

## 2013-04-11 DIAGNOSIS — Q762 Congenital spondylolisthesis: Secondary | ICD-10-CM

## 2013-04-11 DIAGNOSIS — R51 Headache: Secondary | ICD-10-CM | POA: Diagnosis present

## 2013-04-11 DIAGNOSIS — F411 Generalized anxiety disorder: Secondary | ICD-10-CM | POA: Diagnosis present

## 2013-04-11 DIAGNOSIS — Z981 Arthrodesis status: Secondary | ICD-10-CM

## 2013-04-11 DIAGNOSIS — M4716 Other spondylosis with myelopathy, lumbar region: Secondary | ICD-10-CM

## 2013-04-11 DIAGNOSIS — G4700 Insomnia, unspecified: Secondary | ICD-10-CM | POA: Diagnosis present

## 2013-04-11 HISTORY — DX: Unspecified asthma, uncomplicated: J45.909

## 2013-04-11 HISTORY — DX: Other cervical disc degeneration, unspecified cervical region: M50.30

## 2013-04-11 HISTORY — DX: Hemochromatosis, unspecified: E83.119

## 2013-04-11 HISTORY — DX: Other intervertebral disc degeneration, lumbar region: M51.36

## 2013-04-11 HISTORY — DX: Obstructive sleep apnea (adult) (pediatric): G47.33

## 2013-04-11 HISTORY — DX: Other intervertebral disc degeneration, lumbar region without mention of lumbar back pain or lower extremity pain: M51.369

## 2013-04-11 SURGERY — POSTERIOR LUMBAR FUSION 1 LEVEL
Anesthesia: General | Site: Back | Wound class: Clean

## 2013-04-11 MED ORDER — GLYCOPYRROLATE 0.2 MG/ML IJ SOLN
INTRAMUSCULAR | Status: DC | PRN
  Administered 2013-04-11: 0.6 mg via INTRAVENOUS

## 2013-04-11 MED ORDER — SODIUM CHLORIDE 0.9 % IV SOLN
INTRAVENOUS | Status: AC
  Filled 2013-04-11: qty 500

## 2013-04-11 MED ORDER — HYDROMORPHONE HCL PF 1 MG/ML IJ SOLN
0.2500 mg | INTRAMUSCULAR | Status: DC | PRN
  Administered 2013-04-11 (×4): 0.5 mg via INTRAVENOUS

## 2013-04-11 MED ORDER — MENTHOL 3 MG MT LOZG: 1.0000 | LOZENGE | OROMUCOSAL | Status: DC | PRN

## 2013-04-11 MED ORDER — SODIUM CHLORIDE 0.9 % IJ SOLN: 3.0000 mL | INTRAMUSCULAR | Status: DC | PRN

## 2013-04-11 MED ORDER — VERAPAMIL HCL ER 180 MG PO TBCR
180.0000 mg | EXTENDED_RELEASE_TABLET | Freq: Every day | ORAL | Status: DC
  Administered 2013-04-12: 180 mg via ORAL
  Filled 2013-04-11 (×3): qty 1

## 2013-04-11 MED ORDER — LACTATED RINGERS IV SOLN
INTRAVENOUS | Status: DC
  Administered 2013-04-11: 10:00:00 via INTRAVENOUS

## 2013-04-11 MED ORDER — PHENOL 1.4 % MT LIQD: 1.0000 | OROMUCOSAL | Status: DC | PRN

## 2013-04-11 MED ORDER — METHOCARBAMOL 500 MG PO TABS
500.0000 mg | ORAL_TABLET | Freq: Four times a day (QID) | ORAL | Status: DC | PRN
  Administered 2013-04-11 – 2013-04-12 (×4): 500 mg via ORAL
  Filled 2013-04-11 (×3): qty 1

## 2013-04-11 MED ORDER — SENNA 8.6 MG PO TABS
1.0000 | ORAL_TABLET | Freq: Two times a day (BID) | ORAL | Status: DC
  Administered 2013-04-11 – 2013-04-12 (×2): 8.6 mg via ORAL
  Filled 2013-04-11 (×5): qty 1

## 2013-04-11 MED ORDER — ACETAMINOPHEN 650 MG RE SUPP: 650.0000 mg | RECTAL | Status: DC | PRN

## 2013-04-11 MED ORDER — CEFAZOLIN SODIUM 1-5 GM-% IV SOLN
1.0000 g | Freq: Three times a day (TID) | INTRAVENOUS | Status: AC
  Administered 2013-04-11 – 2013-04-12 (×2): 1 g via INTRAVENOUS
  Filled 2013-04-11 (×3): qty 50

## 2013-04-11 MED ORDER — ONDANSETRON HCL 4 MG/2ML IJ SOLN: 4.0000 mg | Freq: Once | INTRAMUSCULAR | Status: DC | PRN

## 2013-04-11 MED ORDER — FENTANYL CITRATE 0.05 MG/ML IJ SOLN
INTRAMUSCULAR | Status: DC | PRN
  Administered 2013-04-11 (×8): 50 ug via INTRAVENOUS

## 2013-04-11 MED ORDER — BACITRACIN 50000 UNITS IM SOLR
INTRAMUSCULAR | Status: AC
  Filled 2013-04-11: qty 1

## 2013-04-11 MED ORDER — FLEET ENEMA 7-19 GM/118ML RE ENEM: 1.0000 | ENEMA | Freq: Once | RECTAL | Status: AC | PRN

## 2013-04-11 MED ORDER — BUPIVACAINE HCL (PF) 0.5 % IJ SOLN
INTRAMUSCULAR | Status: DC | PRN
  Administered 2013-04-11: 4 mL
  Administered 2013-04-11: 10 mL

## 2013-04-11 MED ORDER — GABAPENTIN 300 MG PO CAPS
300.0000 mg | ORAL_CAPSULE | Freq: Every day | ORAL | Status: DC
  Administered 2013-04-11 – 2013-04-12 (×2): 300 mg via ORAL
  Filled 2013-04-11 (×5): qty 1

## 2013-04-11 MED ORDER — POLYETHYLENE GLYCOL 3350 17 G PO PACK
17.0000 g | PACK | Freq: Every day | ORAL | Status: DC | PRN
  Filled 2013-04-11: qty 1

## 2013-04-11 MED ORDER — ROCURONIUM BROMIDE 100 MG/10ML IV SOLN
INTRAVENOUS | Status: DC | PRN
  Administered 2013-04-11: 50 mg via INTRAVENOUS

## 2013-04-11 MED ORDER — SODIUM CHLORIDE 0.9 % IR SOLN
Status: DC | PRN
  Administered 2013-04-11: 14:00:00

## 2013-04-11 MED ORDER — HYDROMORPHONE HCL PF 1 MG/ML IJ SOLN
INTRAMUSCULAR | Status: AC
  Filled 2013-04-11: qty 1

## 2013-04-11 MED ORDER — PHENYLEPHRINE HCL 10 MG/ML IJ SOLN
INTRAMUSCULAR | Status: DC | PRN
  Administered 2013-04-11: 80 ug via INTRAVENOUS

## 2013-04-11 MED ORDER — ACETAMINOPHEN 325 MG PO TABS: 650.0000 mg | ORAL_TABLET | ORAL | Status: DC | PRN

## 2013-04-11 MED ORDER — PROPOFOL 10 MG/ML IV BOLUS
INTRAVENOUS | Status: DC | PRN
  Administered 2013-04-11: 300 mg via INTRAVENOUS

## 2013-04-11 MED ORDER — OXYCODONE-ACETAMINOPHEN 5-325 MG PO TABS
1.0000 | ORAL_TABLET | ORAL | Status: DC | PRN
  Administered 2013-04-11 (×2): 2 via ORAL
  Administered 2013-04-12: 1 via ORAL
  Administered 2013-04-12 – 2013-04-13 (×5): 2 via ORAL
  Filled 2013-04-11 (×4): qty 2
  Filled 2013-04-11: qty 1
  Filled 2013-04-11 (×3): qty 2

## 2013-04-11 MED ORDER — SODIUM CHLORIDE 0.9 % IJ SOLN
3.0000 mL | Freq: Two times a day (BID) | INTRAMUSCULAR | Status: DC
  Administered 2013-04-12 (×2): 3 mL via INTRAVENOUS

## 2013-04-11 MED ORDER — ALUM & MAG HYDROXIDE-SIMETH 200-200-20 MG/5ML PO SUSP: 30.0000 mL | Freq: Four times a day (QID) | ORAL | Status: DC | PRN

## 2013-04-11 MED ORDER — ONDANSETRON HCL 4 MG/2ML IJ SOLN
INTRAMUSCULAR | Status: DC | PRN
  Administered 2013-04-11: 4 mg via INTRAVENOUS

## 2013-04-11 MED ORDER — LORAZEPAM 0.5 MG PO TABS
0.5000 mg | ORAL_TABLET | Freq: Every evening | ORAL | Status: DC | PRN
  Administered 2013-04-11 – 2013-04-12 (×2): 0.5 mg via ORAL
  Filled 2013-04-11 (×2): qty 1

## 2013-04-11 MED ORDER — LACTATED RINGERS IV SOLN
INTRAVENOUS | Status: DC | PRN
  Administered 2013-04-11 (×3): via INTRAVENOUS

## 2013-04-11 MED ORDER — MORPHINE SULFATE 2 MG/ML IJ SOLN
1.0000 mg | INTRAMUSCULAR | Status: DC | PRN
Start: 2013-04-11 — End: 2013-04-13
  Administered 2013-04-11 (×2): 2 mg via INTRAVENOUS
  Administered 2013-04-12: 4 mg via INTRAVENOUS
  Filled 2013-04-11 (×2): qty 1
  Filled 2013-04-11: qty 2
  Filled 2013-04-11: qty 1

## 2013-04-11 MED ORDER — LIDOCAINE-EPINEPHRINE 1 %-1:100000 IJ SOLN
INTRAMUSCULAR | Status: DC | PRN
  Administered 2013-04-11: 4 mL

## 2013-04-11 MED ORDER — BISACODYL 10 MG RE SUPP: 10.0000 mg | Freq: Every day | RECTAL | Status: DC | PRN

## 2013-04-11 MED ORDER — SODIUM CHLORIDE 0.9 % IV SOLN: 250.0000 mL | INTRAVENOUS | Status: DC

## 2013-04-11 MED ORDER — DOCUSATE SODIUM 100 MG PO CAPS
100.0000 mg | ORAL_CAPSULE | Freq: Two times a day (BID) | ORAL | Status: DC
  Administered 2013-04-11 – 2013-04-12 (×3): 100 mg via ORAL
  Filled 2013-04-11 (×3): qty 1

## 2013-04-11 MED ORDER — VERAPAMIL HCL ER 180 MG PO TBCR
180.0000 mg | EXTENDED_RELEASE_TABLET | Freq: Every day | ORAL | Status: DC
  Filled 2013-04-11: qty 1

## 2013-04-11 MED ORDER — SODIUM CHLORIDE 0.9 % IV SOLN
INTRAVENOUS | Status: DC
  Administered 2013-04-11: 18:00:00 via INTRAVENOUS

## 2013-04-11 MED ORDER — NEOSTIGMINE METHYLSULFATE 1 MG/ML IJ SOLN
INTRAMUSCULAR | Status: DC | PRN
  Administered 2013-04-11: 5 mg via INTRAVENOUS

## 2013-04-11 MED ORDER — FUROSEMIDE 40 MG PO TABS
40.0000 mg | ORAL_TABLET | Freq: Every day | ORAL | Status: DC
Start: 2013-04-11 — End: 2013-04-13
  Administered 2013-04-11 – 2013-04-12 (×2): 40 mg via ORAL
  Filled 2013-04-11 (×5): qty 1

## 2013-04-11 MED ORDER — LIDOCAINE HCL (CARDIAC) 20 MG/ML IV SOLN
INTRAVENOUS | Status: DC | PRN
  Administered 2013-04-11: 50 mg via INTRAVENOUS

## 2013-04-11 MED ORDER — ONDANSETRON HCL 4 MG/2ML IJ SOLN: 4.0000 mg | INTRAMUSCULAR | Status: DC | PRN

## 2013-04-11 MED ORDER — SUCCINYLCHOLINE CHLORIDE 20 MG/ML IJ SOLN
INTRAMUSCULAR | Status: DC | PRN
  Administered 2013-04-11: 100 mg via INTRAVENOUS

## 2013-04-11 MED ORDER — LEVOTHYROXINE SODIUM 150 MCG PO TABS
150.0000 ug | ORAL_TABLET | Freq: Every day | ORAL | Status: DC
  Administered 2013-04-12 – 2013-04-13 (×2): 150 ug via ORAL
  Filled 2013-04-11 (×3): qty 1

## 2013-04-11 MED ORDER — SODIUM CHLORIDE 0.9 % IR SOLN
Status: DC | PRN
  Administered 2013-04-11: 1000 mL

## 2013-04-11 MED ORDER — THROMBIN 20000 UNITS EX SOLR
CUTANEOUS | Status: DC | PRN
  Administered 2013-04-11: 14:00:00 via TOPICAL

## 2013-04-11 MED ORDER — MIDAZOLAM HCL 5 MG/5ML IJ SOLN
INTRAMUSCULAR | Status: DC | PRN
  Administered 2013-04-11: 2 mg via INTRAVENOUS

## 2013-04-11 MED ORDER — METHOCARBAMOL 500 MG PO TABS
ORAL_TABLET | ORAL | Status: AC
  Filled 2013-04-11: qty 1

## 2013-04-11 MED ORDER — METHOCARBAMOL 100 MG/ML IJ SOLN
500.0000 mg | Freq: Four times a day (QID) | INTRAVENOUS | Status: DC | PRN
  Administered 2013-04-11: 500 mg via INTRAVENOUS
  Filled 2013-04-11 (×3): qty 5

## 2013-04-11 MED ORDER — ARTIFICIAL TEARS OP OINT
TOPICAL_OINTMENT | OPHTHALMIC | Status: DC | PRN
  Administered 2013-04-11: 1 via OPHTHALMIC

## 2013-04-11 SURGICAL SUPPLY — 60 items
ADH SKN CLS APL DERMABOND .7 (GAUZE/BANDAGES/DRESSINGS) ×1
BAG DECANTER FOR FLEXI CONT (MISCELLANEOUS) ×2 IMPLANT
BLADE SURG ROTATE 9660 (MISCELLANEOUS) IMPLANT
BUR MATCHSTICK NEURO 3.0 LAGG (BURR) ×2 IMPLANT
CAGE PLIF 12MM (Cage) ×2 IMPLANT
CANISTER SUCTION 2500CC (MISCELLANEOUS) ×2 IMPLANT
CLOTH BEACON ORANGE TIMEOUT ST (SAFETY) ×2 IMPLANT
CONT SPEC 4OZ CLIKSEAL STRL BL (MISCELLANEOUS) ×4 IMPLANT
COVER BACK TABLE 24X17X13 BIG (DRAPES) IMPLANT
COVER TABLE BACK 60X90 (DRAPES) ×2 IMPLANT
DECANTER SPIKE VIAL GLASS SM (MISCELLANEOUS) ×2 IMPLANT
DERMABOND ADVANCED (GAUZE/BANDAGES/DRESSINGS) ×1
DERMABOND ADVANCED .7 DNX12 (GAUZE/BANDAGES/DRESSINGS) ×1 IMPLANT
DRAPE C-ARM 42X72 X-RAY (DRAPES) ×4 IMPLANT
DRAPE LAPAROTOMY 100X72X124 (DRAPES) ×2 IMPLANT
DRAPE POUCH INSTRU U-SHP 10X18 (DRAPES) ×2 IMPLANT
DRAPE PROXIMA HALF (DRAPES) IMPLANT
DURAPREP 26ML APPLICATOR (WOUND CARE) ×2 IMPLANT
ELECT REM PT RETURN 9FT ADLT (ELECTROSURGICAL) ×2
ELECTRODE REM PT RTRN 9FT ADLT (ELECTROSURGICAL) ×1 IMPLANT
GAUZE SPONGE 4X4 16PLY XRAY LF (GAUZE/BANDAGES/DRESSINGS) IMPLANT
GLOVE BIO SURGEON STRL SZ 6.5 (GLOVE) ×1 IMPLANT
GLOVE BIOGEL PI IND STRL 6.5 (GLOVE) IMPLANT
GLOVE BIOGEL PI IND STRL 8.5 (GLOVE) ×2 IMPLANT
GLOVE BIOGEL PI INDICATOR 6.5 (GLOVE) ×1
GLOVE BIOGEL PI INDICATOR 8.5 (GLOVE) ×2
GLOVE ECLIPSE 8.5 STRL (GLOVE) ×4 IMPLANT
GLOVE EXAM NITRILE LRG STRL (GLOVE) IMPLANT
GLOVE EXAM NITRILE MD LF STRL (GLOVE) IMPLANT
GLOVE EXAM NITRILE XL STR (GLOVE) IMPLANT
GLOVE EXAM NITRILE XS STR PU (GLOVE) IMPLANT
GOWN BRE IMP SLV AUR LG STRL (GOWN DISPOSABLE) ×3 IMPLANT
GOWN BRE IMP SLV AUR XL STRL (GOWN DISPOSABLE) IMPLANT
GOWN STRL REIN 2XL LVL4 (GOWN DISPOSABLE) ×4 IMPLANT
KIT BASIN OR (CUSTOM PROCEDURE TRAY) ×2 IMPLANT
KIT ROOM TURNOVER OR (KITS) ×2 IMPLANT
MILL MEDIUM DISP (BLADE) ×1 IMPLANT
NEEDLE HYPO 22GX1.5 SAFETY (NEEDLE) ×2 IMPLANT
NS IRRIG 1000ML POUR BTL (IV SOLUTION) ×2 IMPLANT
PACK FOAM VITOSS 10CC (Orthopedic Implant) ×1 IMPLANT
PACK LAMINECTOMY NEURO (CUSTOM PROCEDURE TRAY) ×2 IMPLANT
PAD ARMBOARD 7.5X6 YLW CONV (MISCELLANEOUS) ×6 IMPLANT
PATTIES SURGICAL .5 X1 (DISPOSABLE) ×2 IMPLANT
ROD TI 5.5MM 6CM (Rod) ×2 IMPLANT
SCREW 40MM (Screw) ×2 IMPLANT
SCREW SET (Screw) ×6 IMPLANT
SPONGE GAUZE 4X4 12PLY (GAUZE/BANDAGES/DRESSINGS) ×2 IMPLANT
SPONGE LAP 4X18 X RAY DECT (DISPOSABLE) IMPLANT
SPONGE SURGIFOAM ABS GEL 100 (HEMOSTASIS) ×2 IMPLANT
SUT VIC AB 1 CT1 18XBRD ANBCTR (SUTURE) ×1 IMPLANT
SUT VIC AB 1 CT1 8-18 (SUTURE) ×2
SUT VIC AB 2-0 CP2 18 (SUTURE) ×2 IMPLANT
SUT VIC AB 3-0 SH 8-18 (SUTURE) ×3 IMPLANT
SYR 20ML ECCENTRIC (SYRINGE) ×2 IMPLANT
SYR 3ML LL SCALE MARK (SYRINGE) ×2 IMPLANT
TOWEL OR 17X24 6PK STRL BLUE (TOWEL DISPOSABLE) ×2 IMPLANT
TOWEL OR 17X26 10 PK STRL BLUE (TOWEL DISPOSABLE) ×2 IMPLANT
TRAP SPECIMEN MUCOUS 40CC (MISCELLANEOUS) ×2 IMPLANT
TRAY FOLEY CATH 14FRSI W/METER (CATHETERS) ×2 IMPLANT
WATER STERILE IRR 1000ML POUR (IV SOLUTION) ×2 IMPLANT

## 2013-04-11 NOTE — Op Note (Signed)
Date of surgery: 04/11/2013 Preoperative diagnosis: Lumbar spinal stenosis L3-L4 status post arthrodesis L4-L5 Postoperative diagnosis: Lumbar spinal stenosis L3-L4, status post arthrodesis L4-L5, lumbar radiculopathy Procedure: Decompression L3-4 via laminectomy L3, decompression of L3 and L4 nerve roots, posterior lumbar interbody arthrodesis using peek spacers local allograft and autograft, posterior lateral arthrodesis with allograft and autograft L3-4 pedicle screw fixation L3-4 Surgeon: Barnett Abu Assistant: Sharyon Cable Anesthesia: Gen. endotracheal Indications Larry [name] is a 62 year old individual is had significant back and bilateral leg pain for last 6 months time a number of years ago he underwent decompression and fusion at L4-L5 secondary to degenerative spondylolisthesis and stenosis he did well after that surgery however more recently he has had significant increase in back pain and thigh pain with limitation in his ability to walk. 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 He's been advised regarding surgical decompression and stabilization.  Procedure: The patient was brought to the operating room supine on a stretcher. After the smooth induction of general endotracheal anesthesia, he was turned prone. The back was prepped with alcohol and DuraPrep and draped sterilely. A midline incision through his previous scar was carried down to the lumbar dorsal fascia. The dissection was carried cephalad. Lumbar dorsal fascia was opened on either side of the midline. The old hardware was identified. The screw caps isolated from the pedicle screws and then removed. The rod was mobilized between the screws. The the rod was removed. The pedicle screws at L4 were removed. The lamina arch of L3 was dissected in a subperiosteal fat fashion to isolate the transverse process and the facet complex of L2-L3. Interlaminar space at L 3 L4 was  isolated. A laminectomy was completed then of L3 by removing the laminar arch and complete facet of the L3-L4 complex. This allowed for visualization of significant thickened and redundant yellow ligament was in severe stenosis of the central canal in this region. The ligament was then taken up and removed. The L3 nerve root was severely stenotic in the foramen on both sides. This was decompressed with a 2 and 3 mm Kerrison punch. The decompression was performed bilaterally. The L4 nerve roots were then isolated and decompressed from severe stenosis from marked facet hypertrophy and laminar thickening. The L4 nerve roots were decompressed individually. A discectomy was then completed at L3-L4 removing the entirety of disc and placing spacers into the interspace to distract the interspace. Ultimately peek spacers were placed measuring 12 mm in height. These were packed with the cost in addition to autologous bone that was harvested from the laminectomy. Once the posterior interbody decompression and arthrodesis was performed, pedicle entry sites were chosen at L3. This was done with fluoroscopic guidance. 6.5 x 40 mm screws were placed in the pedicles of L3. This was done by first sounding the pedicle then tapping and then sounding again to make sure there was no cutout. 70 mm precontoured rods were then placed between the pedicle screws of L3-L4 and L5. These were torqued after placing a bit of compression on the L3-L4 construct. The lateral gutters which had been previously decorticated between L3 and L4 were packed with the remainder of autologous bone graft and the cost bone sponge. Thus completing a posterior lateral arthrodesis. Care was taken to make sure that the common dural tube and the L3 and the L4 nerve roots were well decompressed. When this was verified, the lumbar dorsal fascia was closed with #1  Vicryl in interrupted fashion, 2-0 Vicryl the subcutaneous tissues, 3-0 Vicryl subcuticularly. Dermabond  was placed on the skin. Blood loss was estimated at 200 cc. No Cell Saver blood was returned.

## 2013-04-11 NOTE — Anesthesia Procedure Notes (Signed)
Procedure Name: Intubation Date/Time: 04/11/2013 1:40 PM Performed by: Brien Mates DOBSON Pre-anesthesia Checklist: Patient identified, Emergency Drugs available, Suction available, Patient being monitored and Timeout performed Patient Re-evaluated:Patient Re-evaluated prior to inductionOxygen Delivery Method: Circle system utilized Preoxygenation: Pre-oxygenation with 100% oxygen Intubation Type: IV induction Ventilation: Mask ventilation without difficulty Laryngoscope Size: Miller and 3 Grade View: Grade III Tube type: Oral Tube size: 7.5 mm Number of attempts: 2 (DL x 1 by CRNA with miller 2. unable to pick up ep(iglottis.  No attempt at placement of ETT. DL x 1 by MDA with MAC 3. able to visualize VC. Blue stylet used to place ETT. Easy placement of ETT with ) Airway Equipment and Method: Stylet and Bougie stylet Placement Confirmation: ETT inserted through vocal cords under direct vision,  positive ETCO2 and breath sounds checked- equal and bilateral Secured at: 22 cm Tube secured with: Tape Dental Injury: Teeth and Oropharynx as per pre-operative assessment

## 2013-04-11 NOTE — Progress Notes (Signed)
Patient ID: Larry [name], male   DOB: 1950/11/20, 62 y.o.   MRN: [id number] Postop alert oriented patient feels well motor function is intact. Dressing is clean and dry. Stable postop

## 2013-04-11 NOTE — Transfer of Care (Signed)
Immediate Anesthesia Transfer of Care Note  Patient: Larry [name]  Procedure(s) Performed: Procedure(s) with comments: POSTERIOR LUMBAR FUSION 1 LEVEL (N/A) - Lumbar three-four Posterior lumbar interbody fusion/peek spacers/posterolateral arthrodesis  Patient Location: PACU  Anesthesia Type:General  Level of Consciousness: sedated  Airway & Oxygen Therapy: Patient Spontanous Breathing and Patient connected to face mask oxygen  Post-op Assessment: Report given to PACU RN and Post -op Vital signs reviewed and stable  Post vital signs: Reviewed and stable  Complications: No apparent anesthesia complications

## 2013-04-11 NOTE — Preoperative (Signed)
Beta Blockers   Reason not to administer Beta Blockers:Not Applicable 

## 2013-04-11 NOTE — H&P (Signed)
Larry [name] is an 62 y.o. male.   Chief Complaint: Back and bilateral leg pain secondary to L3-4 stenosis HPI: Patient is a 62 year old individual who was previously had lumbar spondylosis and spondylolisthesis with stenosis at the level of L4-L5. He underwent surgical intervention which helped substantially knees done well until the past 6 months where he developed progressively increasing pain in his back and his legs. Recent MRI confirms the presence of adjacent level disease at L3-L4. Having failed efforts at conservative management he's been advised regarding the need to decompress and stabilize L3-L4 which is he is done to L4-L5. Patient is admitted for this procedure now.  Past Medical History  Diagnosis Date  . Difficult intubation     2012   CERVICAL FUSION   . Hypothyroidism   . Arthritis     DDD  . Headache(784.0)     VERAPAMIL FOR PREVENTION     Past Surgical History  Procedure Laterality Date  . Penile debridement      X 3  FOR MRSA INFECTION    . Knee arthroscopy      X 2   . Shoulder arthroscopy    . Nasal septum surgery      2000  ??  . Cervical fusion      2002+2012  . Back surgery      L 4/5 2007  . Hernia repair      BIL IHR 1997     History reviewed. No pertinent family history. Social History:  reports that he has quit smoking. He does not have any smokeless tobacco history on file. He reports that he does not drink alcohol or use illicit drugs.  Allergies:  Allergies  Allergen Reactions  . Morphine And Related Other (See Comments)    Agitation requiring benadryl    Medications Prior to Admission  Medication Sig Dispense Refill  . dextromethorphan-guaiFENesin (MUCINEX DM) 30-600 MG per 12 hr tablet Take 1 tablet by mouth every 12 (twelve) hours.      . furosemide (LASIX) 40 MG tablet Take 40 mg by mouth daily.      Marland Kitchen gabapentin (NEURONTIN) 300 MG capsule Take 300 mg by mouth daily.      Marland Kitchen HYDROcodone-acetaminophen (NORCO/VICODIN) 5-325 MG per  tablet Take 1 tablet by mouth every 6 (six) hours as needed for pain.      Marland Kitchen levothyroxine (SYNTHROID, LEVOTHROID) 150 MCG tablet Take 150 mcg by mouth daily before breakfast.      . LORazepam (ATIVAN) 1 MG tablet Take 0.5 mg by mouth at bedtime as needed and may repeat dose one time if needed for anxiety (insomnia).      . meloxicam (MOBIC) 15 MG tablet Take 15 mg by mouth daily.      . nabumetone (RELAFEN) 500 MG tablet Take 500 mg by mouth 2 (two) times daily.      Marland Kitchen testosterone cypionate (DEPOTESTOTERONE CYPIONATE) 100 MG/ML injection Inject 200 mg into the muscle every 14 (fourteen) days. For IM use only      . verapamil (VERELAN PM) 180 MG 24 hr capsule Take 180 mg by mouth at bedtime. Uses for headaches        No results found for this or any previous visit (from the past 48 hour(s)). No results found.  Review of Systems  Constitutional: Negative.   HENT: Negative.   Eyes: Negative.   Respiratory: Negative.   Cardiovascular: Negative.   Gastrointestinal: Negative.   Genitourinary: Negative.   Musculoskeletal: Positive for  back pain.  Skin: Negative.   Neurological: Positive for sensory change and focal weakness.       Gluteal and buttock pain bilaterally secondary to L4 radiculopathy.  Endo/Heme/Allergies: Negative.   Psychiatric/Behavioral: Negative.     Blood pressure 124/72, pulse 54, temperature 97.3 F (36.3 C), temperature source Oral, resp. rate 20, SpO2 99.00%. Physical Exam  Constitutional: He is oriented to person, place, and time. He appears well-developed and well-nourished.  Eyes: Conjunctivae and EOM are normal. Pupils are equal, round, and reactive to light.  Neck: Normal range of motion. Neck supple.  Cardiovascular: Normal rate and regular rhythm.   Respiratory: Effort normal and breath sounds normal.  GI: Soft. Bowel sounds are normal.  Musculoskeletal:  Well-healed midline scar from previous surgery. Moderate paraspinous muscle spasm noted.   Neurological: He is alert and oriented to person, place, and time.  Mild weakness in the quadricep greater on the left than on right patellar reflex slightly decreased on the left compared to the right. Sensory examination intact in distal lower extremities. Station and gait is intact.  Skin: Skin is warm and dry.  Psychiatric: He has a normal mood and affect. His behavior is normal. Judgment and thought content normal.     Assessment/Plan Spondylolisthesis and stenosis L3-L4 status post arthrodesis L4-L5. Patient is to be admitted to undergo surgical decompression and stabilization. This will be at L3-L4.  Dorreen Valiente J 04/11/2013, 1:00 PM

## 2013-04-11 NOTE — Anesthesia Preprocedure Evaluation (Addendum)
Anesthesia Evaluation  Patient identified by MRN, date of birth, ID band Patient awake    Reviewed: Allergy & Precautions, H&P , NPO status , Patient's Chart, lab work & pertinent test results  History of Anesthesia Complications (+) DIFFICULT AIRWAY  Airway Mallampati: II TM Distance: >3 FB Neck ROM: limited  Mouth opening: Limited Mouth Opening  Dental  (+) Teeth Intact, Dental Advisory Given and Caps,    Pulmonary former smoker,          Cardiovascular negative cardio ROS  Rhythm:regular Rate:Normal     Neuro/Psych  Headaches, PSYCHIATRIC DISORDERS Anxiety    GI/Hepatic negative GI ROS, Neg liver ROS,   Endo/Other  Hypothyroidism   Renal/GU negative Renal ROS     Musculoskeletal negative musculoskeletal ROS (+)   Abdominal   Peds  Hematology   Anesthesia Other Findings   Reproductive/Obstetrics negative OB ROS                         Anesthesia Physical Anesthesia Plan  ASA: II  Anesthesia Plan: General   Post-op Pain Management:    Induction: Intravenous  Airway Management Planned: Oral ETT  Additional Equipment:   Intra-op Plan:   Post-operative Plan: Extubation in OR  Informed Consent: I have reviewed the patients History and Physical, chart, labs and discussed the procedure including the risks, benefits and alternatives for the proposed anesthesia with the patient or authorized representative who has indicated his/her understanding and acceptance.     Plan Discussed with: CRNA, Anesthesiologist and Surgeon  Anesthesia Plan Comments:         Anesthesia Quick Evaluation

## 2013-04-11 NOTE — Anesthesia Postprocedure Evaluation (Signed)
  Anesthesia Post-op Note  Patient: Larry [name]  Procedure(s) Performed: Procedure(s) with comments: POSTERIOR LUMBAR FUSION 1 LEVEL (N/A) - Lumbar three-four Posterior lumbar interbody fusion/peek spacers/posterolateral arthrodesis  Patient Location: PACU  Anesthesia Type:General  Level of Consciousness: awake, alert , oriented and patient cooperative  Airway and Oxygen Therapy: Patient Spontanous Breathing  Post-op Pain: mild  Post-op Assessment: Post-op Vital signs reviewed, Patient's Cardiovascular Status Stable, Respiratory Function Stable, Patent Airway, No signs of Nausea or vomiting and Pain level controlled  Post-op Vital Signs: stable  Complications: No apparent anesthesia complications

## 2013-04-12 ENCOUNTER — Encounter (HOSPITAL_COMMUNITY): Payer: Self-pay | Admitting: General Practice

## 2013-04-12 LAB — BASIC METABOLIC PANEL
BUN: 7 mg/dL (ref 6–23)
CO2: 32 mEq/L (ref 19–32)
Calcium: 8.3 mg/dL — ABNORMAL LOW (ref 8.4–10.5)
Chloride: 102 mEq/L (ref 96–112)
Creatinine, Ser: 0.74 mg/dL (ref 0.50–1.35)
GFR calc Af Amer: >90 mL/min (ref >90)
GFR calc non Af Amer: >90 mL/min (ref >90)
Glucose, Bld: 108 mg/dL — ABNORMAL HIGH (ref 70–99)
Potassium: 3.9 mEq/L (ref 3.5–5.1)
Sodium: 139 mEq/L (ref 135–145)

## 2013-04-12 LAB — CBC
HCT: 38 % — ABNORMAL LOW (ref 39.0–52.0)
Hemoglobin: 13.8 g/dL (ref 13.0–17.0)
MCH: 32.8 pg (ref 26.0–34.0)
MCHC: 36.3 g/dL — ABNORMAL HIGH (ref 30.0–36.0)
MCV: 90.3 fL (ref 78.0–100.0)
Platelets: 127 10*3/uL — ABNORMAL LOW (ref 150–400)
RBC: 4.21 MIL/uL — ABNORMAL LOW (ref 4.22–5.81)
RDW: 13.2 % (ref 11.5–15.5)
WBC: 7.3 10*3/uL (ref 4.0–10.5)

## 2013-04-12 MED FILL — Electrolyte-R (PH 7.4) Solution: INTRAVENOUS | Qty: 1000 | Status: AC

## 2013-04-12 MED FILL — Heparin Sodium (Porcine) Inj 1000 Unit/ML: INTRAMUSCULAR | Qty: 30 | Status: AC

## 2013-04-12 NOTE — Progress Notes (Signed)
   CARE MANAGEMENT NOTE 04/12/2013  Patient:  Larry [name], Larry [name]   Account Number:  [id number]  Date Initiated:  04/12/2013  Documentation initiated by:  Jiles Crocker  Subjective/Objective Assessment:   ADMITTED FOR SURGERY - Lumbar spinal stenosis L3-L4 status post arthrodesis L4-L5     Action/Plan:   LIVES AT HOME WITH SPOUSE; CM FOLLOWING FOR DCP   Anticipated DC Date:  04/19/2013   Anticipated DC Plan:  HOME/SELF CARE      DC Planning Services  CM consult         Status of service:  In process, will continue to follow Medicare Important Message given?  NA - LOS <3 / Initial given by admissions (If response is "NO", the following Medicare IM given date fields will be blank) Per UR Regulation:  Reviewed for med. necessity/level of care/duration of stay  Comments:  04/12/2013- B Silva Aamodt RN,BSN,MHA

## 2013-04-12 NOTE — Evaluation (Signed)
Occupational Therapy Evaluation Patient Details Name: Larry [name] MRN: [id number] DOB: May 01, 1951 Today's Date: 04/12/2013 Time: 4132-4401 OT Time Calculation (min): 33 min  OT Assessment / Plan / Recommendation History of present illness PLIF of L3-4, history of prior back surgeries   Clinical Impression   Pt able to recall 1/3 precautions. Pt educated on precautions and given BAT handout. Pt needed mod cueing to prevent twisting during session during sink level ADL. Pt able to bring feet to knees for LB dressing and bathing. Pt will benefit from skilled OT services while in the acute setting before d/c home with wife who is PTA at nursing home.     OT Assessment  Patient needs continued OT Services    Follow Up Recommendations  No OT follow up       Equipment Recommendations  None recommended by OT       Frequency  Min 2X/week    Precautions / Restrictions Precautions Precautions: Back Precaution Booklet Issued: Yes (comment) Precaution Comments: BAT handout issued and Pt educated Required Braces or Orthoses: Spinal Brace Spinal Brace: Lumbar corset;Applied in sitting position Restrictions Weight Bearing Restrictions: No   Pertinent Vitals/Pain Pt reported 5/10 pain. Adjusted brace.    ADL  Eating/Feeding: Independent Where Assessed - Eating/Feeding: Chair Grooming: Wash/dry hands;Supervision/safety Where Assessed - Grooming: Unsupported standing Upper Body Bathing: Supervision/safety Where Assessed - Upper Body Bathing: Unsupported standing Lower Body Bathing: Supervision/safety Where Assessed - Lower Body Bathing: Unsupported sitting Upper Body Dressing: Supervision/safety Where Assessed - Upper Body Dressing: Unsupported sitting Lower Body Dressing: Supervision/safety Where Assessed - Lower Body Dressing: Unsupported sitting Toilet Transfer: Supervision/safety Toilet Transfer Method: Sit to stand Toilet Transfer Equipment: Comfort height  toilet Toileting - Clothing Manipulation and Hygiene: Modified independent Where Assessed - Engineer, mining and Hygiene: Standing Tub/Shower Transfer: Modified independent Tub/Shower Transfer Method: Science writer: Walk in shower Equipment Used: Back brace;Gait belt Transfers/Ambulation Related to ADLs: Pt supervision for safety and maintenance of precautions. min vc's needed ADL Comments: Pt needed mod reminders during sink level ADL not to twist. Pt educated on keeping things to the right, cup method for brushing teeth, squatting for posterior peri care to maintain precations.    OT Diagnosis: Acute pain  OT Problem List: Decreased knowledge of precautions;Pain OT Treatment Interventions: Self-care/ADL training;Therapeutic activities;Patient/family education   OT Goals(Current goals can be found in the care plan section) Acute Rehab OT Goals Patient Stated Goal: home and eventually back to work OT Goal Formulation: With patient Potential to Achieve Goals: Good ADL Goals Pt Will Perform Upper Body Dressing: with modified independence;sitting Pt Will Perform Lower Body Dressing: with modified independence;sit to/from stand Additional ADL Goal #1: Pt will verbalize 3/3 back precautions and maintain them during ADL  Visit Information  Last OT Received On: 04/12/13 Assistance Needed: +1 History of Present Illness: PLIF of L3-4, history of prior back surgeries       Prior Functioning     Home Living Family/patient expects to be discharged to:: Private residence Living Arrangements: Spouse/significant other Available Help at Discharge: Family Type of Home: House Home Access: Stairs to enter Secretary/administrator of Steps: 4 Entrance Stairs-Rails: Right;Left Home Layout: One level Home Equipment: Information systems manager - built in Prior Function Level of Independence: Independent Comments: 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,  Ambulance person: No difficulties Dominant Hand: Right         Vision/Perception Vision - History Patient Visual Report: No change from baseline   [employer]  Arousal/Alertness: Awake/alert Behavior During Therapy: WFL for tasks assessed/performed Overall Cognitive Status: Within Functional Limits for tasks assessed    Extremity/Trunk Assessment Upper Extremity Assessment Upper Extremity Assessment: Overall WFL for tasks assessed Lower Extremity Assessment Lower Extremity Assessment: Overall WFL for tasks assessed Cervical / Trunk Assessment Cervical / Trunk Assessment: Normal     Mobility Bed Mobility Bed Mobility: Rolling Left;Left Sidelying to Sit;Sitting - Scoot to Edge of Bed Rolling Left: 5: Supervision;With rail Left Sidelying to Sit: 5: Supervision;With rails;HOB elevated Supine to Sit: 6: Modified independent (Device/Increase time) Sitting - Scoot to Edge of Bed: 5: Supervision Sit to Supine: 6: Modified independent (Device/Increase time) Details for Bed Mobility Assistance: Pt supervision with vc's for sequencing and precautions Transfers Transfers: Sit to Stand;Stand to Sit Sit to Stand: 5: Supervision;With upper extremity assist Stand to Sit: 5: Supervision;With upper extremity assist;To chair/3-in-1 Details for Transfer Assistance: Pt with vc's for safe hand placement on descent.        Balance Balance Balance Assessed: No   End of Session OT - End of Session Equipment Utilized During Treatment: Gait belt;Back brace Activity Tolerance: Patient tolerated treatment well Patient left: Other (comment) (ambulating with PT) Nurse Communication: Mobility status;Precautions  GO     Sherryl Manges 04/12/2013, 12:30 PM

## 2013-04-12 NOTE — Clinical Social Work Note (Signed)
Clinical Social Worker received referral for possible ST-SNF placement.  Chart reviewed.  PT/OT evaluations pending at this time.  Spoke with RN Case Manager who will follow up with patient to discuss home health needs if necessary at discharge.    CSW signing off - please re consult if social work needs arise.  Macario Golds, [place] [phone]

## 2013-04-12 NOTE — Evaluation (Signed)
Physical Therapy Evaluation Patient Details Name: Larry [name] MRN: [id number] DOB: 1950/11/20 Today's Date: 04/12/2013 Time: 4098-1191 PT Time Calculation (min): 23 min  PT Assessment / Plan / Recommendation History of Present Illness   Larry [name] is a 62 year old individual is had significant back and bilateral leg pain for last 6 months time a number of years ago he underwent decompression and fusion at L4-L5 secondary to degenerative spondylolisthesis and stenosis he did well after that surgery however more recently he has had significant increase in back pain and thigh pain with limitation in his ability to walk. 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  He is now s/p decomp and PLA at L3/L4.   Clinical Impression  Pt is s/p L34 decompression and fusion.  On eval, pt reinforced in all back care and education.  He is now at [facility] Independent level and will be safe to return home with his wife.  No further PT needs. No DME.    PT Assessment  Patient needs continued PT services    Follow Up Recommendations  No PT follow up    Does the patient have the potential to tolerate intense rehabilitation      Barriers to Discharge        Equipment Recommendations  None recommended by PT    Recommendations for Other Services     Frequency      Precautions / Restrictions Precautions Precautions: Back Precaution Booklet Issued: Yes (comment) Precaution Comments: BAT handout issued and Pt educated Required Braces or Orthoses: Spinal Brace Spinal Brace: Lumbar corset;Applied in sitting position Restrictions Weight Bearing Restrictions: No   Pertinent Vitals/Pain       Mobility  Bed Mobility Bed Mobility: Supine to Sit;Sitting - Scoot to Edge of Bed;Sit to Supine Rolling Left: 5: Supervision;With rail Left Sidelying to Sit: 5: Supervision;With rails;HOB elevated Supine to Sit: 6: Modified independent (Device/Increase  time) Sitting - Scoot to Edge of Bed: 6: Modified independent (Device/Increase time) Sit to Supine: 6: Modified independent (Device/Increase time) Details for Bed Mobility Assistance: pt showed good handle on bed mobility safety Transfers Transfers: Sit to Stand;Stand to Sit Sit to Stand: 6: Modified independent (Device/Increase time) Stand to Sit: 6: Modified independent (Device/Increase time) Details for Transfer Assistance: Pt with vc's for safe hand placement on descent. Ambulation/Gait Ambulation/Gait Assistance: 6: Modified independent (Device/Increase time);1: +2 Total assist Ambulation Distance (Feet): 900 Feet Assistive device: None Ambulation/Gait Assistance Details: steady, but mildly guarded Gait Pattern: Within Functional Limits Gait velocity: slower Stairs: Yes Stair Management Technique: One rail Right;Alternating pattern;Forwards Number of Stairs: 3    Exercises     PT Diagnosis:    PT Problem List:   PT Treatment Interventions:       PT Goals(Current goals can be found in the care plan section) Acute Rehab PT Goals Patient Stated Goal: home and eventually back to work Potential to Achieve Goals: Good  Visit Information  Last PT Received On: 04/12/13 Assistance Needed: +1 History of Present Illness: PLIF of L3-4, history of prior back surgeries       Prior Functioning  Home Living Family/patient expects to be discharged to:: Private residence Living Arrangements: Spouse/significant other Available Help at Discharge: Family Type of Home: House Home Access: Stairs to enter Secretary/administrator of Steps: 4 Entrance Stairs-Rails: Right;Left Home Layout: One level Home Equipment: Shower seat - built in Prior Function Level of Independence: Independent 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[employer], driving Communication Communication: No difficulties Dominant Hand: Right    Cognition  Cognition Arousal/Alertness: Awake/alert Behavior During Therapy: WFL  for tasks assessed/performed Overall Cognitive Status: Within Functional Limits for tasks assessed    Extremity/Trunk Assessment Upper Extremity Assessment Upper Extremity Assessment: Overall WFL for tasks assessed Lower Extremity Assessment Lower Extremity Assessment: Overall WFL for tasks assessed Cervical / Trunk Assessment Cervical / Trunk Assessment: Normal   Balance Balance Balance Assessed: No  End of Session PT - End of Session Equipment Utilized During Treatment: Gait belt Activity Tolerance: Patient tolerated treatment well Patient left: Other (comment);with family/visitor present (sitting EOB) Nurse Communication: Mobility status  GP     Larry [name], Eliseo Gum 04/12/2013, 12:16 PM 04/12/2013  Larry [name], PT [phone]  (pager)

## 2013-04-12 NOTE — Progress Notes (Signed)
Subjective: Patient reports offers no complaints. Back pain typical  Objective: Vital signs in last 24 hours: Temp:  [97.3 F (36.3 C)-98.7 F (37.1 C)] 97.9 F (36.6 C) (07/16 4098) Pulse Rate:  [54-88] 76 (07/16 0633) Resp:  [10-26] 18 (07/16 0633) BP: (106-151)/(57-85) 115/57 mmHg (07/16 0633) SpO2:  [96 %-100 %] 100 % (07/16 1191)  Intake/Output from previous day: 07/15 0701 - 07/16 0700 In: 2700 [I.V.:2700] Out: 3375 [Urine:3225; Blood:150] Intake/Output this shift:    dressing clean and dry, motor function ok no numbness.  Lab Results:  Recent Labs  04/12/13 0415  WBC 7.3  HGB 13.8  HCT 38.0*  PLT 127*   BMET  Recent Labs  04/12/13 0415  NA 139  K 3.9  CL 102  CO2 32  GLUCOSE 108*  BUN 7  CREATININE 0.74  CALCIUM 8.3*    Studies/Results: Dg Lumbar Spine 2-3 Views  04/11/2013   *RADIOLOGY REPORT*  Clinical Data: 62 year old male undergoing lumbar surgery.  DG C-ARM 1-60 MIN, LUMBAR SPINE - 2-3 VIEW  Comparison: Lumbar MRI 03/14/2013.  Findings: Two intraoperative fluoroscopic views of the lower lumbar spine.  L4-L5 fusion depicted on the comparison.  These images show cephalad extension of posterior and interbody fusion hardware to the L3-L4 level.  Fluoroscopy time:  0 minutes 28 seconds.  IMPRESSION: Cephalad extension of lumbar posterior and interbody fusions L3-L4.   Original Report Authenticated By: Erskine Speed, M.D.   Dg C-arm 1-60 Min  04/11/2013   *RADIOLOGY REPORT*  Clinical Data: 62 year old male undergoing lumbar surgery.  DG C-ARM 1-60 MIN, LUMBAR SPINE - 2-3 VIEW  Comparison: Lumbar MRI 03/14/2013.  Findings: Two intraoperative fluoroscopic views of the lower lumbar spine.  L4-L5 fusion depicted on the comparison.  These images show cephalad extension of posterior and interbody fusion hardware to the L3-L4 level.  Fluoroscopy time:  0 minutes 28 seconds.  IMPRESSION: Cephalad extension of lumbar posterior and interbody fusions L3-L4.   Original  Report Authenticated By: Erskine Speed, M.D.    Assessment/Plan: Stable labs po day one. Needs brace.   Has been fitted/   LOS: 1 day  mobilize. brace   Corona Popovich J 04/12/2013, 8:58 AM

## 2013-04-12 NOTE — Progress Notes (Signed)
Patient denied morphine allergy. Changed in the system. Morphine given without complications.

## 2013-04-12 NOTE — Evaluation (Signed)
I agree with the following treatment note after reviewing documentation.   Johnston, Shaun Zuccaro Brynn   OTR/L Pager: [phone] Office: [phone] .   

## 2013-04-12 NOTE — Progress Notes (Signed)
Orthopedic Tech Progress Note Patient Details:  Larry [name] 01-Apr-1951 [id number]  Patient ID: Larry [name], male   DOB: Apr 29, 1951, 62 y.o.   MRN: [id number]   Larry [name] 04/12/2013, 8:17 AM Called [employer] for aspen lumbar corset brace.

## 2013-04-13 MED ORDER — METHOCARBAMOL 500 MG PO TABS: 500.0000 mg | ORAL_TABLET | Freq: Four times a day (QID) | ORAL | Status: DC | PRN

## 2013-04-13 MED ORDER — OXYCODONE-ACETAMINOPHEN 5-325 MG PO TABS: 1.0000 | ORAL_TABLET | ORAL | Status: DC | PRN

## 2013-04-13 NOTE — Discharge Summary (Signed)
Physician Discharge Summary  Patient ID: Larry [name] MRN: [id number] DOB/AGE: 61-13-1952 61 y.o.  Admit date: 04/11/2013 Discharge date: 04/13/2013  Admission Diagnoses: Lumbar stenosis with radiculopathy L3-L4, status post arthrodesis L4-L5 for spondylolisthesis  Discharge Diagnoses: Lumbar stenosis with radiculopathy L3-L4, status post arthrodesis L4-L5 for spondylolisthesis Active Problems:   * No active hospital problems. *   Discharged Condition: good  Hospital Course: Patient was admitted to undergo surgical decompression and stabilization at L3-L4. A previous fusion at L4-L5. He tolerated procedure well. He is met all his goals and is walking independently, pain is well controlled with oral Percocet.  Consults: None  Significant Diagnostic Studies: None  Treatments: Decompression L3-L4 posterior interbody arthrodesis with peek spacers local autograft and allograft segmental fixation L3-L5.  Discharge Exam: Blood pressure 110/64, pulse 73, temperature 97.8 F (36.6 C), temperature source Oral, resp. rate 18, SpO2 98.00%. Station and gait are normal. Incision is clean and dry. Other neurologic examination is completely within the limits of normal.  Disposition: 01-Home or Self Care  Discharge Orders   Future Orders Complete By Expires     Call MD for:  redness, tenderness, or signs of infection (pain, swelling, redness, odor or green/yellow discharge around incision site)  As directed     Call MD for:  severe uncontrolled pain  As directed     Call MD for:  temperature >100.4  As directed     Diet - low sodium heart healthy  As directed     Discharge instructions  As directed     Comments:      Okay to shower. Do not apply salves or appointments to incision. No heavy lifting with the upper extremities greater than 15 pounds. May resume driving when not requiring pain medication and patient feels comfortable with doing so.    Increase activity slowly  As directed         Medication List    STOP taking these medications       meloxicam 15 MG tablet  Commonly known as:  MOBIC     nabumetone 500 MG tablet  Commonly known as:  RELAFEN      TAKE these medications       dextromethorphan-guaiFENesin 30-600 MG per 12 hr tablet  Commonly known as:  MUCINEX DM  Take 1 tablet by mouth every 12 (twelve) hours.     furosemide 40 MG tablet  Commonly known as:  LASIX  Take 40 mg by mouth daily.     gabapentin 300 MG capsule  Commonly known as:  NEURONTIN  Take 300 mg by mouth daily.     HYDROcodone-acetaminophen 5-325 MG per tablet  Commonly known as:  NORCO/VICODIN  Take 1 tablet by mouth every 6 (six) hours as needed for pain.     levothyroxine 150 MCG tablet  Commonly known as:  SYNTHROID, LEVOTHROID  Take 150 mcg by mouth daily before breakfast.     LORazepam 1 MG tablet  Commonly known as:  ATIVAN  Take 0.5 mg by mouth at bedtime as needed and may repeat dose one time if needed for anxiety (insomnia).     methocarbamol 500 MG tablet  Commonly known as:  ROBAXIN  Take 1 tablet (500 mg total) by mouth every 6 (six) hours as needed.     oxyCODONE-acetaminophen 5-325 MG per tablet  Commonly known as:  PERCOCET/ROXICET  Take 1-2 tablets by mouth every 4 (four) hours as needed for pain.     testosterone cypionate 100 MG/ML  injection  Commonly known as:  DEPOTESTOTERONE CYPIONATE  Inject 200 mg into the muscle every 14 (fourteen) days. For IM use only     verapamil 180 MG 24 hr capsule  Commonly known as:  VERELAN PM  Take 180 mg by mouth at bedtime. Uses for headaches         Signed: Stefani Dama 04/13/2013, 8:45 AM

## 2013-06-18 ENCOUNTER — Emergency Department (INDEPENDENT_AMBULATORY_CARE_PROVIDER_SITE_OTHER)
Admission: EM | Admit: 2013-06-18 | Discharge: 2013-06-18 | Disposition: A | Discharge status: Home / Self Care | Payer: BC Managed Care – PPO | Source: Home / Self Care

## 2013-06-18 ENCOUNTER — Emergency Department (INDEPENDENT_AMBULATORY_CARE_PROVIDER_SITE_OTHER): Payer: BC Managed Care – PPO

## 2013-06-18 ENCOUNTER — Encounter (HOSPITAL_COMMUNITY): Payer: Self-pay | Admitting: Emergency Medicine

## 2013-06-18 DIAGNOSIS — L0291 Cutaneous abscess, unspecified: Secondary | ICD-10-CM

## 2013-06-18 DIAGNOSIS — L0390 Cellulitis, unspecified: Secondary | ICD-10-CM

## 2013-06-18 MED ORDER — DEXAMETHASONE SODIUM PHOSPHATE 10 MG/ML IJ SOLN
10.0000 mg | Freq: Once | INTRAMUSCULAR | Status: AC
  Administered 2013-06-18: 10 mg via INTRAMUSCULAR

## 2013-06-18 MED ORDER — DEXAMETHASONE SODIUM PHOSPHATE 10 MG/ML IJ SOLN
INTRAMUSCULAR | Status: AC
  Filled 2013-06-18: qty 1

## 2013-06-18 MED ORDER — CEPHALEXIN 250 MG PO CAPS: 250.0000 mg | ORAL_CAPSULE | Freq: Four times a day (QID) | ORAL | Status: DC

## 2013-06-18 NOTE — ED Provider Notes (Signed)
CSN: [id number]     Arrival date & time 06/18/13  4098 History   None    Chief Complaint  Patient presents with  . Foreign Body   (Consider location/radiation/quality/duration/timing/severity/associated sxs/prior Treatment) HPI Comments: 62 yo male felt pop when giving himself a testosterone injection in the right lateral thigh this a.m. Syringe was removed and the needle was not attached. Patient has noticed mild swelling and tenderness at the site of the injection.  Patient is a 62 y.o. male presenting with foreign body.  Foreign Body   Past Medical History  Diagnosis Date  . Difficult intubation     2012   CERVICAL FUSION   . Hypothyroidism   . Headache(784.0)     VERAPAMIL FOR PREVENTION   . Asthma     "as a child" (04/12/2013)  . OSA (obstructive sleep apnea)     "haven't wore mask in years; had OR; still snore" (04/12/2013)  . Hemochromatosis     "defective gene" (04/12/2013)  . DDD (degenerative disc disease), cervical   . DDD (degenerative disc disease), lumbar   . Arthritis     "knees; left shoulder" (04/12/2013)   Past Surgical History  Procedure Laterality Date  . Penile debridement  ~ 2011    X 3  FOR MRSA INFECTION    . Knee arthroscopy Left ~ 1997; ~ 2005  . Shoulder arthroscopy Left ~ 2003  . Uvulopalatopharyngoplasty (uppp)/tonsillectomy/septoplasty  1990's  . Cervical fusion  2002; 2012  . Lumbar fusion  2007; 04/11/2013  . Tonsillectomy  1990's  . Inguinal hernia repair Bilateral 1997  . Back surgery     No family history on file. History  Substance Use Topics  . Smoking status: Former Smoker -- 0.50 packs/day for 15 years    Types: Cigarettes    Quit date: 10/14/1986  . Smokeless tobacco: Never Used  . Alcohol Use: No    Review of Systems  Constitutional: Negative.   Respiratory: Negative.   Cardiovascular: Negative.   Musculoskeletal: Negative.   Skin: Positive for wound.  Neurological: Negative.   Psychiatric/Behavioral: Negative.   All  other systems reviewed and are negative.    Allergies  Review of patient's allergies indicates no known allergies.  Home Medications   Current Outpatient Rx  Name  Route  Sig  Dispense  Refill  . dextromethorphan-guaiFENesin (MUCINEX DM) 30-600 MG per 12 hr tablet   Oral   Take 1 tablet by mouth every 12 (twelve) hours.         . furosemide (LASIX) 40 MG tablet   Oral   Take 40 mg by mouth daily.         Marland Kitchen gabapentin (NEURONTIN) 300 MG capsule   Oral   Take 300 mg by mouth daily.         Marland Kitchen HYDROcodone-acetaminophen (NORCO/VICODIN) 5-325 MG per tablet   Oral   Take 1 tablet by mouth every 6 (six) hours as needed for pain.         Marland Kitchen levothyroxine (SYNTHROID, LEVOTHROID) 150 MCG tablet   Oral   Take 150 mcg by mouth daily before breakfast.         . LORazepam (ATIVAN) 1 MG tablet   Oral   Take 0.5 mg by mouth at bedtime as needed and may repeat dose one time if needed for anxiety (insomnia).         . methocarbamol (ROBAXIN) 500 MG tablet   Oral   Take 1 tablet (500 mg total) by  mouth every 6 (six) hours as needed.   60 tablet   3   . oxyCODONE-acetaminophen (PERCOCET/ROXICET) 5-325 MG per tablet   Oral   Take 1-2 tablets by mouth every 4 (four) hours as needed for pain.   60 tablet   0   . testosterone cypionate (DEPOTESTOTERONE CYPIONATE) 100 MG/ML injection   Intramuscular   Inject 200 mg into the muscle every 14 (fourteen) days. For IM use only         . verapamil (VERELAN PM) 180 MG 24 hr capsule   Oral   Take 180 mg by mouth at bedtime. Uses for headaches          BP 151/86  Pulse 68  Temp(Src) 98.5 F (36.9 C) (Oral)  Resp 18  SpO2 100% Physical Exam  Nursing note and vitals reviewed. Constitutional: He appears well-developed and well-nourished.  Neck: Normal range of motion.  Cardiovascular: Normal rate, regular rhythm, normal heart sounds and intact distal pulses.   Pulmonary/Chest: Effort normal and breath sounds normal.   Skin: Skin is warm and dry. No rash noted. There is erythema.  Mild erythema at site of injection with area of edema approximately 1 1/2 inches x 1 inch.    ED Course  Procedures (including critical care time) Labs Review Labs Reviewed - No data to display Imaging Review Dg Femur Right  06/18/2013   *RADIOLOGY REPORT*  Clinical Data: Assess for foreign body.  The patient gave himself an injection in the right thigh and heard a pop but could not find the needle.  RIGHT FEMUR - 4  VIEW  Comparison: None.  Findings: No radiopaque needle is identified in the soft tissues of the right thigh. There are radiopaque clips projected over the pelvis.  There are degenerative joint changes of the right knee. No acute fracture or dislocation is noted.  IMPRESSION: No radiopaque needle is identified within the soft tissues of the right thigh. No acute bony abnormality.   Original Report Authenticated By: Sherian Rein, M.D.   After negative xray and ultrasound of the soft tissue of femur obtained, xray of syringe brought by patient was performed with Location of the needle in the syringe body. The needle was part of a retractable needle/ syringe device system and the pop noise patient mentioned was the activation of the retraction device. Patient reassured. MDM  HTN Recheck in 1 week if >130/80 after 1 week needs re-evaluation at PCP Dexamethasone 10mg  IM Kefelex 250mg  TID-(QID) x 10 days Advised of wound hygiene. F/U at PCP in one week for retraining of testosterone injections     Berenice Primas, PA-C 06/18/13 1500  Berenice Primas, [place] 06/18/13 1547

## 2013-06-18 NOTE — ED Notes (Signed)
Pyxis out of decadron-ordered from [employer] has gone to get medicien

## 2013-06-18 NOTE — ED Notes (Signed)
Patient reports he gives self testosterone injections.  He gave himself injection this am in right thigh, heard pop and saw liquid running down leg, but could not find needle.  Patient reports visible puncture site.  No pain.  Patient instructed to remove jeans and put on gown for further exam

## 2013-06-18 NOTE — ED Provider Notes (Signed)
Limited musculoskeletal ultrasound of the right thigh; 1 cm long linear hypoechoic change present in the muscle tissue consistent with fascial layer.  Doubtful for foreign body  [name], MD  Rodolph Bong, MD 06/18/13 1224

## 2013-06-19 NOTE — ED Provider Notes (Signed)
Problem:  Thigh Pain.   Medical screening examination/treatment/procedure(s) were performed by a resident physician or non-physician practitioner and as the supervising physician I was immediately available for consultation/collaboration.  [name], MD    Rodolph Bong, MD 06/19/13 [phone]

## 2013-08-03 ENCOUNTER — Other Ambulatory Visit: Payer: Self-pay

## 2015-01-30 ENCOUNTER — Other Ambulatory Visit (HOSPITAL_COMMUNITY): Payer: Self-pay | Admitting: Gastroenterology

## 2015-07-10 ENCOUNTER — Encounter (HOSPITAL_COMMUNITY)
Admission: RE | Admit: 2015-07-10 | Discharge: 2015-07-10 | Disposition: A | Discharge status: Home / Self Care | Payer: BLUE CROSS/BLUE SHIELD | Source: Ambulatory Visit | Attending: Orthopaedic Surgery | Admitting: Orthopaedic Surgery

## 2015-07-10 ENCOUNTER — Encounter (HOSPITAL_COMMUNITY): Payer: Self-pay

## 2015-07-10 ENCOUNTER — Ambulatory Visit (HOSPITAL_COMMUNITY)
Admission: RE | Admit: 2015-07-10 | Discharge: 2015-07-10 | Disposition: A | Discharge status: Home / Self Care | Payer: BLUE CROSS/BLUE SHIELD | Source: Ambulatory Visit | Attending: Orthopedic Surgery | Admitting: Orthopedic Surgery

## 2015-07-10 DIAGNOSIS — R001 Bradycardia, unspecified: Secondary | ICD-10-CM | POA: Diagnosis not present

## 2015-07-10 DIAGNOSIS — Z7982 Long term (current) use of aspirin: Secondary | ICD-10-CM | POA: Insufficient documentation

## 2015-07-10 DIAGNOSIS — E039 Hypothyroidism, unspecified: Secondary | ICD-10-CM | POA: Diagnosis not present

## 2015-07-10 DIAGNOSIS — Z01818 Encounter for other preprocedural examination: Secondary | ICD-10-CM | POA: Insufficient documentation

## 2015-07-10 DIAGNOSIS — J45909 Unspecified asthma, uncomplicated: Secondary | ICD-10-CM | POA: Diagnosis not present

## 2015-07-10 DIAGNOSIS — Z981 Arthrodesis status: Secondary | ICD-10-CM | POA: Insufficient documentation

## 2015-07-10 DIAGNOSIS — Z87891 Personal history of nicotine dependence: Secondary | ICD-10-CM | POA: Insufficient documentation

## 2015-07-10 DIAGNOSIS — Z0183 Encounter for blood typing: Secondary | ICD-10-CM | POA: Insufficient documentation

## 2015-07-10 DIAGNOSIS — G4733 Obstructive sleep apnea (adult) (pediatric): Secondary | ICD-10-CM | POA: Diagnosis not present

## 2015-07-10 DIAGNOSIS — Z01812 Encounter for preprocedural laboratory examination: Secondary | ICD-10-CM | POA: Diagnosis not present

## 2015-07-10 DIAGNOSIS — Z79899 Other long term (current) drug therapy: Secondary | ICD-10-CM | POA: Diagnosis not present

## 2015-07-10 DIAGNOSIS — M179 Osteoarthritis of knee, unspecified: Secondary | ICD-10-CM | POA: Insufficient documentation

## 2015-07-10 HISTORY — DX: Personal history of other infectious and parasitic diseases: Z86.19

## 2015-07-10 HISTORY — DX: Unspecified hearing loss, bilateral: H91.93

## 2015-07-10 HISTORY — DX: Frequency of micturition: R35.0

## 2015-07-10 LAB — PROTIME-INR
INR: 1.15 (ref 0.00–1.49)
Prothrombin Time: 14.8 seconds (ref 11.6–15.2)

## 2015-07-10 LAB — CBC WITH DIFFERENTIAL/PLATELET
Basophils Absolute: 0 10*3/uL (ref 0.0–0.1)
Basophils Relative: 0 %
Eosinophils Absolute: 0.2 10*3/uL (ref 0.0–0.7)
Eosinophils Relative: 2 %
HCT: 48.3 % (ref 39.0–52.0)
Hemoglobin: 17.4 g/dL — ABNORMAL HIGH (ref 13.0–17.0)
Lymphocytes Relative: 26 %
Lymphs Abs: 1.8 10*3/uL (ref 0.7–4.0)
MCH: 32.6 pg (ref 26.0–34.0)
MCHC: 36 g/dL (ref 30.0–36.0)
MCV: 90.4 fL (ref 78.0–100.0)
Monocytes Absolute: 0.5 10*3/uL (ref 0.1–1.0)
Monocytes Relative: 7 %
Neutro Abs: 4.2 10*3/uL (ref 1.7–7.7)
Neutrophils Relative %: 65 %
Platelets: 157 10*3/uL (ref 150–400)
RBC: 5.34 MIL/uL (ref 4.22–5.81)
RDW: 13.2 % (ref 11.5–15.5)
WBC: 6.7 10*3/uL (ref 4.0–10.5)

## 2015-07-10 LAB — COMPREHENSIVE METABOLIC PANEL
ALT: 50 U/L (ref 17–63)
AST: 37 U/L (ref 15–41)
Albumin: 3.9 g/dL (ref 3.5–5.0)
Alkaline Phosphatase: 60 U/L (ref 38–126)
Anion gap: 11 (ref 5–15)
BUN: 17 mg/dL (ref 6–20)
CO2: 25 mmol/L (ref 22–32)
Calcium: 9.2 mg/dL (ref 8.9–10.3)
Chloride: 104 mmol/L (ref 101–111)
Creatinine, Ser: 0.87 mg/dL (ref 0.61–1.24)
GFR calc Af Amer: >60 mL/min (ref >60)
GFR calc non Af Amer: >60 mL/min (ref >60)
Glucose, Bld: 103 mg/dL — ABNORMAL HIGH (ref 65–99)
Potassium: 3.9 mmol/L (ref 3.5–5.1)
Sodium: 140 mmol/L (ref 135–145)
Total Bilirubin: 1.1 mg/dL (ref 0.3–1.2)
Total Protein: 6.4 g/dL — ABNORMAL LOW (ref 6.5–8.1)

## 2015-07-10 LAB — APTT: aPTT: 29 seconds (ref 24–37)

## 2015-07-10 LAB — URINALYSIS, ROUTINE W REFLEX MICROSCOPIC
Glucose, UA: NEGATIVE mg/dL
Hgb urine dipstick: NEGATIVE
Ketones, ur: NEGATIVE mg/dL
Leukocytes, UA: NEGATIVE
Nitrite: NEGATIVE
Protein, ur: NEGATIVE mg/dL
Specific Gravity, Urine: 1.026 (ref 1.005–1.030)
Urobilinogen, UA: 1 mg/dL (ref 0.0–1.0)
pH: 6 (ref 5.0–8.0)

## 2015-07-10 LAB — SURGICAL PCR SCREEN
MRSA, PCR: NEGATIVE
Staphylococcus aureus: NEGATIVE

## 2015-07-10 LAB — TYPE AND SCREEN
ABO/RH(D): A POS
Antibody Screen: NEGATIVE

## 2015-07-10 NOTE — Pre-Procedure Instructions (Signed)
    Larry [name]  07/10/2015      [facility] - [place], [place] - [address] [place] [address] Phone: [phone] Fax: [place] [address] - [place], [place] [address] Phone: [phone] Fax: [phone]    Your procedure is scheduled on Tuesday, October 25th, 2016.  Report to [facility] Admitting at 8:00 A.M.  Call this number if you have problems the morning of surgery:  [phone]   Remember:  Do not eat food or drink liquids after midnight.   Take these medicines the morning of surgery with A SIP OF WATER:   Hydrocodone-acetaminophen (Norco/Vicodin) if needed, Levothyroxine (Synthroid), Lorazepam (Ativan) if needed, Methocarbamol (Robaxin) if needed, Oxycodone-acetaminophen (Percocet) if needed.  7 days prior to surgery, stop taking the following: Aspirin, NSAIDS, BC's, Goody's, Ibuprofen, Motrin, Advil, Naproxen, Aleve, fish oil, all herbal medications, and all vitamins.     Do not wear jewelry.  Do not wear lotions, powders, or colognes.  You may wear deodorant.  Men may shave face and neck.  Do not bring valuables to the hospital.  [facility] is not responsible for any belongings or valuables.  Contacts, dentures or bridgework may not be worn into surgery.  Leave your suitcase in the car.  After surgery it may be brought to your room.  For patients admitted to the hospital, discharge time will be determined by your treatment team.  Patients discharged the day of surgery will not be allowed to drive home.   Special instructions:  See attached.   Please read over the following fact sheets that you were given. Pain Booklet, Coughing and Deep Breathing, Blood Transfusion Information, MRSA Information and Surgical Site Infection Prevention

## 2015-07-10 NOTE — Progress Notes (Signed)
PCP - Dr. Anda Kraft Cardiologist - Denies  EKG- 07/10/2015 - Epic CXR - 07/10/2015 - Epic  Echo/Stress test/Cardiac Cath - denies  Patient denies shortness of breath and chest pain at PAT appointment.

## 2015-07-11 NOTE — Progress Notes (Signed)
Anesthesia Chart Review:  Pt is 64 year old male scheduled for R total knee arthroplasty on 07/23/2015 with Dr. Durward Fortes.   PMH includes: OSA (not on CPAP), asthma, hypothyroidism, hemochromatosis. Former smoker. BMI 30. S/p 1 level posterior lumbar fusion 04/11/13.   Anesthesia history includes difficult intubation. Patient reports he was told he was a difficult airway following his 2007 lumbar fusion. He has had several surgeries since, and denies history of an awake intubation. Most recently on 04/11/13 [facility]), intubated after 2 attempts: DL x 1 by CRNA with miller 2. unable to pick up epiglottis. No attempt at placement of ETT. DL x 1 by MDA with MAC 3. able to visualize VC. Blue stylet used to place ETT. Easy placement of ETT.    On 04/05/06 [facility]) he was noted to be an unexpected difficult airway due to anterior larynx and glidescope was utilized. On 07/08/2007 [facility]) a glidescope with Eschmann stylet were used. At [facility] in 2012 bag mask ventilation was difficult. His airway was ultimately secured asleep, using a Miller 3 blade (Bougie passed then 7.5 ETT). A LMA was used in his two subsequent surgeries. On 06/18/11 ([facility]) a Meredith Staggers was utilized.   Medications include: ASA, lasix, levothyroxine, verapamil.   Preoperative labs reviewed.    Chest x-ray 07/10/2015 reviewed. There is no active cardiopulmonary disease. Mild interstitial prominence likely reflects the patient's smoking history.  EKG 07/10/2015: Sinus bradycardia (57 bpm). Possible Left atrial enlargement.  Pt will be evaluated by his assigned anesthesiologist on the day of surgery to discuss the definitive anesthesia plan.  Willeen Cass, FNP-BC [facility]/Anesthesiology Phone: [phone] 07/11/2015 2:17 PM

## 2015-07-12 LAB — URINE CULTURE: Culture: 9000

## 2015-07-16 NOTE — H&P (Signed)
CHIEF COMPLAINT:  Painful right knee.    HISTORY OF PRESENT ILLNESS:  Larry [name] is a very pleasant 64 year old white male who is seen today for evaluation of his right knee.  He has had problems with the right knee for some time.  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  He has had problems with both of his knees, but most recently the right knee over the past 3-1/2 months has become extremely uncomfortable.  He is having popping and clicking and difficulty with walking.  He complains of some effusions.  He has used some Relafen, which is somewhat beneficial.  He has even used narcotics to help his pain.  These were from a previous back surgery, which he is not having any problems.  He has tried using a brace, but now is to the point where he is having pain with every step as well as pain with activities of daily living.  He has difficulty with sleeping at nighttime secondary to this pain.  He has been tried with conservative treatment, but so far nothing is really helping him.  It is certainly limiting his ability to be gainfully employed also.  Seen today for evaluation.     PAST MEDICAL HISTORY:  In general, his health is good.      PAST SURGICAL HISTORY:  Hospitalizations and surgeries: 1.    Left shoulder scope. 2.    Left knee scope x2. 3.    Cervical fusion 2 levels.  4.    Lumbar spine fusion.  5.    Sinus and tonsillectomy for sleep apnea.     6.    Apparently had an abscess MRSA in the groin and lower abdominal area that was taken care of by laryngology.    MEDICATIONS:   1.    Prevagen 10 mg daily.  2.    Multivitamin daily. 3.    Verapamil ER 180 mg daily. 4.    Tamsulosin 0.4 mg daily. 5.    Lorazepam 0.5 mg at bedtime. 6.    Testosterone 0.5 mL every 2 weeks. 7.    Lasix 40 mg daily. 8.    Synthroid 15 mcg daily.  9.    Meloxicam 15 mg daily. 10.  Hydrocodone 5/325 p.r.n.  11.   Aspirin 81 mg daily.    ALLERGIES:  None known.     REVIEW OF SYSTEMS:  A 14-point review of systems positive for glasses and decreased hearing.  He has had asthma in the teens.  He is hypothyroid, on Synthroid.  He has had sleep apnea in the past, but this is improved with surgery.    FAMILY HISTORY:  Positive for a mother who died at 28 from congestive heart failure and COPD.  Father who died at 40 from lung cancer.  He only has step brothers and sisters.     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  He denies the use of alcohol.  He had smoked and quit in 1988.  Prior to that he had a 10-year pack history of 1 pack per 10 days.     PHYSICAL EXAMINATION:  Height 5 feet 6 inches, weight 188 pounds, BMI 30.3.  Vital signs:  Temperature 97.8, pulse 64, respirations 16, blood pressure 138/88. Head:  Normocephalic.  Eyes:  Pupils equal, round and reactive to light and accommodation with extraocular  movements intact.  Ears, nose and throat:  Benign. Neck:  Supple.  No carotid bruits.  Chest:  Good expansion.   Lungs:  Essentially clear. Cardiac:   Regular rhythm and rate.  Normal S1, S2.  No discrete murmurs, rubs or gallops appreciated.     Pulses:  2+ bilateral and symmetric in the lower extremities. Abdomen:  Scaphoid, soft, nontender.  No masses palpable.  Normal bowel sounds present.   CNS:  He is oriented x3.  Cranial nerves II-XII grossly intact.   Genitorectal/breast:  Not indicated for an orthopedic evaluation.   Musculoskeletal:  He has a small effusion today.  Very painful medially.  He lacks about 10 degrees of full extension.  He can flex to about 105-110 degrees.  He does have a little bit of pseudolaxity with varus and valgus stressing, but there is a good endpoint.  Really cannot tell if there is an anterior drawer or not.  He has good motion of his hip.      CLINICAL IMPRESSION:   1.  End-stage OA of the right knee.  2.  Hypothyroidism.      RECOMMENDATIONS:   At this time, I have reviewed a documentation from Dr. Wilson Singer, who feels that he is medically cleared for total knee arthroplasty.  Therefore, our plan is to proceed with a right total knee arthroplasty.  Procedure risks and benefits were fully explained to him in detail using appropriate models.  All questions were answered.  We are going to proceed with this in the very near future.     Mike Craze [place], [place] [phone]  07/16/2015 2:15 PM

## 2015-07-22 MED ORDER — SODIUM CHLORIDE 0.9 % IV SOLN: INTRAVENOUS | Status: DC

## 2015-07-22 MED ORDER — CHLORHEXIDINE GLUCONATE 4 % EX LIQD: 60.0000 mL | Freq: Once | CUTANEOUS | Status: DC

## 2015-07-22 MED ORDER — CEFAZOLIN SODIUM-DEXTROSE 2-3 GM-% IV SOLR
2.0000 g | INTRAVENOUS | Status: AC
  Administered 2015-07-23: 2 g via INTRAVENOUS
  Filled 2015-07-22: qty 50

## 2015-07-22 MED ORDER — ACETAMINOPHEN 10 MG/ML IV SOLN
1000.0000 mg | INTRAVENOUS | Status: AC
  Administered 2015-07-23: 1000 mg via INTRAVENOUS
  Filled 2015-07-22: qty 100

## 2015-07-23 ENCOUNTER — Inpatient Hospital Stay (HOSPITAL_COMMUNITY)
Admission: RE | Admit: 2015-07-23 | Discharge: 2015-07-25 | DRG: 470 | Disposition: A | Discharge status: Home Health | Payer: BLUE CROSS/BLUE SHIELD | Source: Ambulatory Visit | Attending: Orthopaedic Surgery | Admitting: Orthopaedic Surgery

## 2015-07-23 ENCOUNTER — Encounter (HOSPITAL_COMMUNITY): Payer: Self-pay | Admitting: *Deleted

## 2015-07-23 ENCOUNTER — Encounter (HOSPITAL_COMMUNITY)
Admission: RE | Disposition: A | Discharge status: Home Health | Payer: Self-pay | Source: Ambulatory Visit | Attending: Orthopaedic Surgery

## 2015-07-23 ENCOUNTER — Inpatient Hospital Stay (HOSPITAL_COMMUNITY): Payer: BLUE CROSS/BLUE SHIELD | Admitting: Anesthesiology

## 2015-07-23 ENCOUNTER — Inpatient Hospital Stay (HOSPITAL_COMMUNITY): Payer: BLUE CROSS/BLUE SHIELD | Admitting: Vascular Surgery

## 2015-07-23 DIAGNOSIS — E039 Hypothyroidism, unspecified: Secondary | ICD-10-CM | POA: Diagnosis present

## 2015-07-23 DIAGNOSIS — M25561 Pain in right knee: Secondary | ICD-10-CM | POA: Diagnosis present

## 2015-07-23 DIAGNOSIS — Z6830 Body mass index (BMI) 30.0-30.9, adult: Secondary | ICD-10-CM | POA: Diagnosis not present

## 2015-07-23 DIAGNOSIS — M1710 Unilateral primary osteoarthritis, unspecified knee: Secondary | ICD-10-CM | POA: Diagnosis present

## 2015-07-23 DIAGNOSIS — Z8249 Family history of ischemic heart disease and other diseases of the circulatory system: Secondary | ICD-10-CM | POA: Diagnosis not present

## 2015-07-23 DIAGNOSIS — Z79899 Other long term (current) drug therapy: Secondary | ICD-10-CM | POA: Diagnosis not present

## 2015-07-23 DIAGNOSIS — Z7982 Long term (current) use of aspirin: Secondary | ICD-10-CM

## 2015-07-23 DIAGNOSIS — D62 Acute posthemorrhagic anemia: Secondary | ICD-10-CM | POA: Diagnosis not present

## 2015-07-23 DIAGNOSIS — G4733 Obstructive sleep apnea (adult) (pediatric): Secondary | ICD-10-CM | POA: Diagnosis present

## 2015-07-23 DIAGNOSIS — E871 Hypo-osmolality and hyponatremia: Secondary | ICD-10-CM | POA: Diagnosis not present

## 2015-07-23 DIAGNOSIS — Z87891 Personal history of nicotine dependence: Secondary | ICD-10-CM | POA: Diagnosis not present

## 2015-07-23 DIAGNOSIS — M1711 Unilateral primary osteoarthritis, right knee: Secondary | ICD-10-CM | POA: Diagnosis present

## 2015-07-23 DIAGNOSIS — Z825 Family history of asthma and other chronic lower respiratory diseases: Secondary | ICD-10-CM

## 2015-07-23 HISTORY — PX: TOTAL KNEE ARTHROPLASTY: SHX125

## 2015-07-23 SURGERY — ARTHROPLASTY, KNEE, TOTAL
Anesthesia: Monitor Anesthesia Care | Site: Knee | Laterality: Right

## 2015-07-23 MED ORDER — METHOCARBAMOL 500 MG PO TABS
500.0000 mg | ORAL_TABLET | Freq: Four times a day (QID) | ORAL | Status: DC | PRN
  Administered 2015-07-23 – 2015-07-25 (×4): 500 mg via ORAL
  Filled 2015-07-23 (×4): qty 1

## 2015-07-23 MED ORDER — LIDOCAINE HCL (CARDIAC) 20 MG/ML IV SOLN
INTRAVENOUS | Status: AC
  Filled 2015-07-23: qty 5

## 2015-07-23 MED ORDER — ONDANSETRON HCL 4 MG/2ML IJ SOLN: 4.0000 mg | Freq: Once | INTRAMUSCULAR | Status: DC | PRN

## 2015-07-23 MED ORDER — PNEUMOCOCCAL VAC POLYVALENT 25 MCG/0.5ML IJ INJ
0.5000 mL | INJECTION | INTRAMUSCULAR | Status: AC
  Administered 2015-07-24: 0.5 mL via INTRAMUSCULAR
  Filled 2015-07-23: qty 0.5

## 2015-07-23 MED ORDER — FENTANYL CITRATE (PF) 250 MCG/5ML IJ SOLN
INTRAMUSCULAR | Status: AC
  Filled 2015-07-23: qty 5

## 2015-07-23 MED ORDER — SODIUM CHLORIDE 0.9 % IR SOLN
Status: DC | PRN
  Administered 2015-07-23: 1000 mL

## 2015-07-23 MED ORDER — ONDANSETRON HCL 4 MG PO TABS: 4.0000 mg | ORAL_TABLET | Freq: Four times a day (QID) | ORAL | Status: DC | PRN

## 2015-07-23 MED ORDER — TRANEXAMIC ACID 1000 MG/10ML IV SOLN
2000.0000 mg | INTRAVENOUS | Status: AC
  Administered 2015-07-23: 2000 mg via TOPICAL
  Filled 2015-07-23: qty 20

## 2015-07-23 MED ORDER — TAMSULOSIN HCL 0.4 MG PO CAPS
0.4000 mg | ORAL_CAPSULE | Freq: Every day | ORAL | Status: DC
  Administered 2015-07-23 – 2015-07-25 (×3): 0.4 mg via ORAL
  Filled 2015-07-23 (×3): qty 1

## 2015-07-23 MED ORDER — ONDANSETRON HCL 4 MG/2ML IJ SOLN: 4.0000 mg | Freq: Four times a day (QID) | INTRAMUSCULAR | Status: DC | PRN

## 2015-07-23 MED ORDER — DIPHENHYDRAMINE HCL 12.5 MG/5ML PO ELIX
12.5000 mg | ORAL_SOLUTION | ORAL | Status: DC | PRN
  Administered 2015-07-25: 25 mg via ORAL
  Filled 2015-07-23: qty 10

## 2015-07-23 MED ORDER — METOCLOPRAMIDE HCL 5 MG/ML IJ SOLN: 5.0000 mg | Freq: Three times a day (TID) | INTRAMUSCULAR | Status: DC | PRN

## 2015-07-23 MED ORDER — POLYETHYLENE GLYCOL 3350 17 G PO PACK: 17.0000 g | PACK | Freq: Every day | ORAL | Status: DC | PRN

## 2015-07-23 MED ORDER — OXYCODONE HCL 5 MG PO TABS
5.0000 mg | ORAL_TABLET | ORAL | Status: DC | PRN
  Administered 2015-07-23 – 2015-07-24 (×4): 10 mg via ORAL
  Filled 2015-07-23 (×4): qty 2

## 2015-07-23 MED ORDER — PHENOL 1.4 % MT LIQD: 1.0000 | OROMUCOSAL | Status: DC | PRN

## 2015-07-23 MED ORDER — ARTIFICIAL TEARS OP OINT
TOPICAL_OINTMENT | OPHTHALMIC | Status: AC
  Filled 2015-07-23: qty 3.5

## 2015-07-23 MED ORDER — HYDROMORPHONE HCL 1 MG/ML IJ SOLN
0.5000 mg | Freq: Once | INTRAMUSCULAR | Status: AC
  Administered 2015-07-23: 0.5 mg via INTRAVENOUS

## 2015-07-23 MED ORDER — MIDAZOLAM HCL 5 MG/5ML IJ SOLN
INTRAMUSCULAR | Status: DC | PRN
  Administered 2015-07-23: 1 mg via INTRAVENOUS

## 2015-07-23 MED ORDER — HYDRALAZINE HCL 20 MG/ML IJ SOLN
5.0000 mg | Freq: Once | INTRAMUSCULAR | Status: AC
  Administered 2015-07-23: 5 mg via INTRAVENOUS

## 2015-07-23 MED ORDER — HYDROMORPHONE HCL 1 MG/ML IJ SOLN
0.5000 mg | INTRAMUSCULAR | Status: DC | PRN
  Administered 2015-07-23 – 2015-07-25 (×10): 1 mg via INTRAVENOUS
  Filled 2015-07-23 (×10): qty 1

## 2015-07-23 MED ORDER — FENTANYL CITRATE (PF) 100 MCG/2ML IJ SOLN
INTRAMUSCULAR | Status: DC | PRN
  Administered 2015-07-23: 100 ug via INTRAVENOUS
  Administered 2015-07-23 (×2): 50 ug via INTRAVENOUS

## 2015-07-23 MED ORDER — BISACODYL 10 MG RE SUPP: 10.0000 mg | Freq: Every day | RECTAL | Status: DC | PRN

## 2015-07-23 MED ORDER — CEFAZOLIN SODIUM-DEXTROSE 2-3 GM-% IV SOLR
2.0000 g | Freq: Four times a day (QID) | INTRAVENOUS | Status: AC
  Administered 2015-07-23 (×2): 2 g via INTRAVENOUS
  Filled 2015-07-23 (×2): qty 50

## 2015-07-23 MED ORDER — LORAZEPAM 0.5 MG PO TABS
0.5000 mg | ORAL_TABLET | Freq: Four times a day (QID) | ORAL | Status: DC
  Administered 2015-07-23 – 2015-07-24 (×2): 0.5 mg via ORAL
  Filled 2015-07-23 (×4): qty 1

## 2015-07-23 MED ORDER — LEVOTHYROXINE SODIUM 150 MCG PO TABS
150.0000 ug | ORAL_TABLET | Freq: Every day | ORAL | Status: DC
  Administered 2015-07-24 – 2015-07-25 (×2): 150 ug via ORAL
  Filled 2015-07-23 (×2): qty 1

## 2015-07-23 MED ORDER — OXYCODONE HCL 5 MG/5ML PO SOLN: 5.0000 mg | Freq: Once | ORAL | Status: AC | PRN

## 2015-07-23 MED ORDER — PROPOFOL 10 MG/ML IV BOLUS
INTRAVENOUS | Status: DC | PRN
  Administered 2015-07-23: 150 mg via INTRAVENOUS
  Administered 2015-07-23: 30 mg via INTRAVENOUS

## 2015-07-23 MED ORDER — KETOROLAC TROMETHAMINE 15 MG/ML IJ SOLN
INTRAMUSCULAR | Status: AC
  Filled 2015-07-23: qty 1

## 2015-07-23 MED ORDER — SODIUM CHLORIDE 0.9 % IV SOLN
INTRAVENOUS | Status: DC
  Administered 2015-07-23: 17:00:00 via INTRAVENOUS

## 2015-07-23 MED ORDER — MIDAZOLAM HCL 2 MG/2ML IJ SOLN
INTRAMUSCULAR | Status: AC
  Administered 2015-07-23: 1 mg
  Filled 2015-07-23: qty 2

## 2015-07-23 MED ORDER — MIDAZOLAM HCL 2 MG/2ML IJ SOLN
INTRAMUSCULAR | Status: AC
  Filled 2015-07-23: qty 4

## 2015-07-23 MED ORDER — HYDROMORPHONE HCL 1 MG/ML IJ SOLN
0.2500 mg | INTRAMUSCULAR | Status: DC | PRN
  Administered 2015-07-23 (×4): 0.5 mg via INTRAVENOUS

## 2015-07-23 MED ORDER — PROPOFOL 500 MG/50ML IV EMUL
INTRAVENOUS | Status: DC | PRN
  Administered 2015-07-23: 50 ug/kg/min via INTRAVENOUS

## 2015-07-23 MED ORDER — ACETAMINOPHEN 10 MG/ML IV SOLN
1000.0000 mg | Freq: Four times a day (QID) | INTRAVENOUS | Status: AC
  Administered 2015-07-23 – 2015-07-24 (×4): 1000 mg via INTRAVENOUS
  Filled 2015-07-23 (×4): qty 100

## 2015-07-23 MED ORDER — MENTHOL 3 MG MT LOZG: 1.0000 | LOZENGE | OROMUCOSAL | Status: DC | PRN

## 2015-07-23 MED ORDER — LIDOCAINE HCL (CARDIAC) 20 MG/ML IV SOLN
INTRAVENOUS | Status: DC | PRN
  Administered 2015-07-23: 50 mg via INTRAVENOUS

## 2015-07-23 MED ORDER — DOCUSATE SODIUM 100 MG PO CAPS
100.0000 mg | ORAL_CAPSULE | Freq: Two times a day (BID) | ORAL | Status: DC
  Administered 2015-07-23 – 2015-07-25 (×4): 100 mg via ORAL
  Filled 2015-07-23 (×4): qty 1

## 2015-07-23 MED ORDER — BUPIVACAINE-EPINEPHRINE 0.25% -1:200000 IJ SOLN
INTRAMUSCULAR | Status: DC | PRN
  Administered 2015-07-23: 30 mL

## 2015-07-23 MED ORDER — INFLUENZA VAC SPLIT QUAD 0.5 ML IM SUSY
0.5000 mL | PREFILLED_SYRINGE | INTRAMUSCULAR | Status: AC
  Administered 2015-07-24: 0.5 mL via INTRAMUSCULAR
  Filled 2015-07-23: qty 0.5

## 2015-07-23 MED ORDER — KETOROLAC TROMETHAMINE 15 MG/ML IJ SOLN
15.0000 mg | Freq: Four times a day (QID) | INTRAMUSCULAR | Status: AC
  Administered 2015-07-23 – 2015-07-24 (×4): 15 mg via INTRAVENOUS
  Filled 2015-07-23 (×3): qty 1

## 2015-07-23 MED ORDER — LACTATED RINGERS IV SOLN
INTRAVENOUS | Status: DC
  Administered 2015-07-23 (×3): via INTRAVENOUS

## 2015-07-23 MED ORDER — HYDRALAZINE HCL 20 MG/ML IJ SOLN
INTRAMUSCULAR | Status: AC
  Filled 2015-07-23: qty 1

## 2015-07-23 MED ORDER — MAGNESIUM CITRATE PO SOLN: 1.0000 | Freq: Once | ORAL | Status: DC | PRN

## 2015-07-23 MED ORDER — METHOCARBAMOL 1000 MG/10ML IJ SOLN: 500.0000 mg | Freq: Four times a day (QID) | INTRAVENOUS | Status: DC | PRN

## 2015-07-23 MED ORDER — APOAEQUORIN 10 MG PO CAPS: 1.0000 | ORAL_CAPSULE | Freq: Every day | ORAL | Status: DC

## 2015-07-23 MED ORDER — HYDROMORPHONE HCL 1 MG/ML IJ SOLN
INTRAMUSCULAR | Status: AC
  Filled 2015-07-23: qty 1

## 2015-07-23 MED ORDER — OXYCODONE HCL 5 MG PO TABS
5.0000 mg | ORAL_TABLET | Freq: Once | ORAL | Status: AC | PRN
  Administered 2015-07-23: 5 mg via ORAL

## 2015-07-23 MED ORDER — HYDRALAZINE HCL 20 MG/ML IJ SOLN
5.0000 mg | Freq: Once | INTRAMUSCULAR | Status: AC
  Administered 2015-07-23: 15:00:00 via INTRAVENOUS

## 2015-07-23 MED ORDER — BUPIVACAINE-EPINEPHRINE (PF) 0.25% -1:200000 IJ SOLN
INTRAMUSCULAR | Status: AC
  Filled 2015-07-23: qty 30

## 2015-07-23 MED ORDER — FUROSEMIDE 40 MG PO TABS
40.0000 mg | ORAL_TABLET | Freq: Every day | ORAL | Status: DC
  Administered 2015-07-23 – 2015-07-25 (×3): 40 mg via ORAL
  Filled 2015-07-23 (×3): qty 1

## 2015-07-23 MED ORDER — ALUM & MAG HYDROXIDE-SIMETH 200-200-20 MG/5ML PO SUSP: 30.0000 mL | ORAL | Status: DC | PRN

## 2015-07-23 MED ORDER — VERAPAMIL HCL ER 180 MG PO TBCR
180.0000 mg | EXTENDED_RELEASE_TABLET | Freq: Every day | ORAL | Status: DC
  Administered 2015-07-23 – 2015-07-24 (×2): 180 mg via ORAL
  Filled 2015-07-23 (×4): qty 1

## 2015-07-23 MED ORDER — VERAPAMIL HCL ER 180 MG PO CP24: 180.0000 mg | ORAL_CAPSULE | Freq: Every day | ORAL | Status: DC

## 2015-07-23 MED ORDER — FENTANYL CITRATE (PF) 100 MCG/2ML IJ SOLN
INTRAMUSCULAR | Status: AC
  Administered 2015-07-23: 50 ug
  Filled 2015-07-23: qty 2

## 2015-07-23 MED ORDER — OXYCODONE HCL 5 MG PO TABS
ORAL_TABLET | ORAL | Status: AC
  Filled 2015-07-23: qty 1

## 2015-07-23 MED ORDER — PROPOFOL 10 MG/ML IV BOLUS
INTRAVENOUS | Status: AC
Start: 2015-07-23 — End: 2015-07-23
  Filled 2015-07-23: qty 20

## 2015-07-23 MED ORDER — RIVAROXABAN 10 MG PO TABS
10.0000 mg | ORAL_TABLET | Freq: Every day | ORAL | Status: DC
  Administered 2015-07-24 – 2015-07-25 (×2): 10 mg via ORAL
  Filled 2015-07-23 (×2): qty 1

## 2015-07-23 MED ORDER — ONDANSETRON HCL 4 MG/2ML IJ SOLN
INTRAMUSCULAR | Status: AC
  Filled 2015-07-23: qty 2

## 2015-07-23 MED ORDER — METHOCARBAMOL 1000 MG/10ML IJ SOLN
500.0000 mg | INTRAVENOUS | Status: AC
  Administered 2015-07-23: 500 mg via INTRAVENOUS
  Filled 2015-07-23: qty 5

## 2015-07-23 MED ORDER — METOCLOPRAMIDE HCL 5 MG PO TABS: 5.0000 mg | ORAL_TABLET | Freq: Three times a day (TID) | ORAL | Status: DC | PRN

## 2015-07-23 SURGICAL SUPPLY — 59 items
BANDAGE ESMARK 6X9 LF (GAUZE/BANDAGES/DRESSINGS) ×1 IMPLANT
BLADE SAGITTAL 25.0X1.19X90 (BLADE) ×2 IMPLANT
BNDG CMPR 9X6 STRL LF SNTH (GAUZE/BANDAGES/DRESSINGS) ×1
BNDG ESMARK 6X9 LF (GAUZE/BANDAGES/DRESSINGS) ×2
BOWL SMART MIX CTS (DISPOSABLE) ×2 IMPLANT
CAP KNEE TOTAL 3 SIGMA ×1 IMPLANT
CEMENT HV SMART SET (Cement) ×4 IMPLANT
COVER SURGICAL LIGHT HANDLE (MISCELLANEOUS) ×2 IMPLANT
CUFF TOURNIQUET SINGLE 34IN LL (TOURNIQUET CUFF) ×1 IMPLANT
CUFF TOURNIQUET SINGLE 44IN (TOURNIQUET CUFF) IMPLANT
DRAPE EXTREMITY T 121X128X90 (DRAPE) ×2 IMPLANT
DRAPE PROXIMA HALF (DRAPES) ×2 IMPLANT
DRSG ADAPTIC 3X8 NADH LF (GAUZE/BANDAGES/DRESSINGS) ×2 IMPLANT
DRSG PAD ABDOMINAL 8X10 ST (GAUZE/BANDAGES/DRESSINGS) ×3 IMPLANT
DURAPREP 26ML APPLICATOR (WOUND CARE) ×4 IMPLANT
ELECT CAUTERY BLADE 6.4 (BLADE) ×2 IMPLANT
ELECT REM PT RETURN 9FT ADLT (ELECTROSURGICAL) ×2
ELECTRODE REM PT RTRN 9FT ADLT (ELECTROSURGICAL) ×1 IMPLANT
EVACUATOR 1/8 PVC DRAIN (DRAIN) IMPLANT
FACESHIELD WRAPAROUND (MASK) ×4 IMPLANT
FACESHIELD WRAPAROUND OR TEAM (MASK) ×2 IMPLANT
GAUZE SPONGE 4X4 12PLY STRL (GAUZE/BANDAGES/DRESSINGS) ×2 IMPLANT
GLOVE BIOGEL PI IND STRL 8 (GLOVE) ×1 IMPLANT
GLOVE BIOGEL PI IND STRL 8.5 (GLOVE) ×1 IMPLANT
GLOVE BIOGEL PI INDICATOR 8 (GLOVE) ×1
GLOVE BIOGEL PI INDICATOR 8.5 (GLOVE) ×1
GLOVE ECLIPSE 8.0 STRL XLNG CF (GLOVE) ×4 IMPLANT
GLOVE SURG ORTHO 8.5 STRL (GLOVE) ×4 IMPLANT
GOWN STRL REUS W/ TWL LRG LVL3 (GOWN DISPOSABLE) ×2 IMPLANT
GOWN STRL REUS W/TWL 2XL LVL3 (GOWN DISPOSABLE) ×2 IMPLANT
GOWN STRL REUS W/TWL LRG LVL3 (GOWN DISPOSABLE) ×4
HANDPIECE INTERPULSE COAX TIP (DISPOSABLE) ×2
KIT BASIN OR (CUSTOM PROCEDURE TRAY) ×2 IMPLANT
KIT ROOM TURNOVER OR (KITS) ×2 IMPLANT
MANIFOLD NEPTUNE II (INSTRUMENTS) ×2 IMPLANT
NEEDLE 22X1 1/2 (OR ONLY) (NEEDLE) ×2 IMPLANT
NS IRRIG 1000ML POUR BTL (IV SOLUTION) ×2 IMPLANT
PACK TOTAL JOINT (CUSTOM PROCEDURE TRAY) ×2 IMPLANT
PACK UNIVERSAL I (CUSTOM PROCEDURE TRAY) ×2 IMPLANT
PAD ARMBOARD 7.5X6 YLW CONV (MISCELLANEOUS) ×4 IMPLANT
PAD CAST 4YDX4 CTTN HI CHSV (CAST SUPPLIES) ×1 IMPLANT
PADDING CAST COTTON 4X4 STRL (CAST SUPPLIES) ×2
PADDING CAST COTTON 6X4 STRL (CAST SUPPLIES) ×2 IMPLANT
SET HNDPC FAN SPRY TIP SCT (DISPOSABLE) ×1 IMPLANT
STAPLER VISISTAT 35W (STAPLE) ×2 IMPLANT
SUCTION FRAZIER TIP 10 FR DISP (SUCTIONS) ×2 IMPLANT
SURGIFLO W/THROMBIN 8M KIT (HEMOSTASIS) IMPLANT
SUT BONE WAX W31G (SUTURE) ×2 IMPLANT
SUT ETHIBOND NAB CT1 #1 30IN (SUTURE) ×4 IMPLANT
SUT MNCRL AB 3-0 PS2 18 (SUTURE) ×2 IMPLANT
SUT VIC AB 0 CT1 27 (SUTURE) ×2
SUT VIC AB 0 CT1 27XBRD ANBCTR (SUTURE) ×1 IMPLANT
SYR CONTROL 10ML LL (SYRINGE) ×1 IMPLANT
TOWEL OR 17X24 6PK STRL BLUE (TOWEL DISPOSABLE) ×2 IMPLANT
TOWEL OR 17X26 10 PK STRL BLUE (TOWEL DISPOSABLE) ×2 IMPLANT
TRAY FOLEY CATH 16FRSI W/METER (SET/KITS/TRAYS/PACK) IMPLANT
WATER STERILE IRR 1000ML POUR (IV SOLUTION) ×2 IMPLANT
WRAP KNEE MAXI GEL POST OP (GAUZE/BANDAGES/DRESSINGS) ×2 IMPLANT
YANKAUER SUCT BULB TIP NO VENT (SUCTIONS) ×1 IMPLANT

## 2015-07-23 NOTE — Progress Notes (Signed)
Utilization review completed.  

## 2015-07-23 NOTE — Anesthesia Postprocedure Evaluation (Signed)
  Anesthesia Post-op Note  Patient: Larry [name]  Procedure(s) Performed: Procedure(s): TOTAL KNEE ARTHROPLASTY (Right)  Patient Location: PACU  Anesthesia Type:General  Level of Consciousness: awake, alert  and oriented  Airway and Oxygen Therapy: Patient Spontanous Breathing  Post-op Pain: mild  Post-op Assessment: Post-op Vital signs reviewed, Patient's Cardiovascular Status Stable, Respiratory Function Stable, Patent Airway and Pain level controlled     RLE Motor Response: Responds to commands RLE Sensation: Full sensation      Post-op Vital Signs: stable  Last Vitals:  Filed Vitals:   07/23/15 1600  BP:   Pulse: 69  Temp: 37 C  Resp: 9    Complications: No apparent anesthesia complications

## 2015-07-23 NOTE — Progress Notes (Signed)
Orthopedic Tech Progress Note Patient Details:  Larry [name] [birth year] [id number]  CPM Right Knee CPM Right Knee: On Right Knee Flexion (Degrees): 90 Right Knee Extension (Degrees): 0 Additional Comments: trapeze bar patient helper Viewed order from doctor's order list  Hildred Priest 07/23/2015, 2:34 PM

## 2015-07-23 NOTE — Transfer of Care (Signed)
Immediate Anesthesia Transfer of Care Note  Patient: Larry [name] Apfel  Procedure(s) Performed: Procedure(s): TOTAL KNEE ARTHROPLASTY (Right)  Patient Location: PACU  Anesthesia Type:General  Level of Consciousness: awake  Airway & Oxygen Therapy: Patient Spontanous Breathing and Patient connected to face mask oxygen  Post-op Assessment: Report given to RN, Post -op Vital signs reviewed and stable and Patient moving all extremities  Post vital signs: Reviewed and stable  Last Vitals:  Filed Vitals:   07/23/15 0952  BP: 150/90  Pulse: 67  Temp:   Resp: 12    Complications: No apparent anesthesia complications

## 2015-07-23 NOTE — Anesthesia Preprocedure Evaluation (Addendum)
Anesthesia Evaluation  Patient identified by MRN, date of birth, ID band Patient awake    Reviewed: Allergy & Precautions, NPO status , Patient's Chart, lab work & pertinent test results  Airway Mallampati: II  TM Distance: >3 FB Neck ROM: Full    Dental  (+) Teeth Intact, Dental Advisory Given   Pulmonary former smoker,    breath sounds clear to auscultation       Cardiovascular  Rhythm:Regular Rate:Normal     Neuro/Psych    GI/Hepatic   Endo/Other    Renal/GU      Musculoskeletal   Abdominal   Peds  Hematology   Anesthesia Other Findings   Reproductive/Obstetrics                            Anesthesia Physical Anesthesia Plan  ASA: II  Anesthesia Plan: MAC and Spinal   Post-op Pain Management: MAC Combined w/ Regional for Post-op pain   Induction: Intravenous  Airway Management Planned: Natural Airway and Simple Face Mask  Additional Equipment:   Intra-op Plan:   Post-operative Plan:   Informed Consent: I have reviewed the patients History and Physical, chart, labs and discussed the procedure including the risks, benefits and alternatives for the proposed anesthesia with the patient or authorized representative who has indicated his/her understanding and acceptance.     Plan Discussed with: CRNA and Anesthesiologist  Anesthesia Plan Comments:         Anesthesia Quick Evaluation

## 2015-07-23 NOTE — Op Note (Signed)
PATIENT ID:      TERYN GUST  MRN:     [id number] DOB/AGE:    1951-05-30 / 64 y.o.       OPERATIVE REPORT    DATE OF PROCEDURE:  07/23/2015       PREOPERATIVE DIAGNOSIS: PRIMARY, END STAGE RIGHT KNEE OSTEOARTHRITIS                                                       Estimated body mass index is 30.36 kg/(m^2) as calculated from the following:   Height as of this encounter: 5\' 6"  (1.676 m).   Weight as of this encounter: 85.276 kg (188 lb).     POSTOPERATIVE DIAGNOSIS:   RIGHT KNEE OSTEOARTHRITIS-SAME                                                                     Estimated body mass index is 30.36 kg/(m^2) as calculated from the following:   Height as of this encounter: 5\' 6"  (1.676 m).   Weight as of this encounter: 85.276 kg (188 lb).     PROCEDURE:  Procedure(s):RIGHT TOTAL KNEE ARTHROPLASTY      SURGEON:  Joni Fears, MD    ASSISTANT:   Biagio Borg, PA-C   (Present and scrubbed throughout the case, critical for assistance with exposure, retraction, instrumentation, and closure.)          ANESTHESIA: regional and general     DRAINS: (RIGHT KNEE) Hemovact drain(s) in the CLAMPED with  Suction Clamped :      TOURNIQUET TIME:  Total Tourniquet Time Documented: Thigh (Right) - 77 minutes Total: Thigh (Right) - 77 minutes     COMPLICATIONS:  None   CONDITION:  stable  PROCEDURE IN DETAIL: [id number]   WHITFIELD, PETER W 07/23/2015, 12:28 PM

## 2015-07-23 NOTE — H&P (Signed)
  The recent History & Physical has been reviewed. I have personally examined the patient today. There is no interval change to the documented History & Physical. The patient would like to proceed with the procedure.  Joni Fears W 07/23/2015,  9:52 AM

## 2015-07-23 NOTE — Plan of Care (Signed)
Problem: Consults Goal: Diagnosis- Total Joint Replacement Outcome: Completed/Met Date Met:  07/23/15 Primary Total Knee right

## 2015-07-23 NOTE — Evaluation (Addendum)
Physical Therapy Evaluation Patient Details Name: Larry [name] MRN: [id number] DOB: 20-Jan-1951 Today's Date: 07/23/2015   History of Present Illness  64 y.o. male admitted to [facility] on 07/23/15 for elective R TKA.  Pt with significant PMHx of HA, asthma, DDD of cervical and lumbar spine, urinary frequency, multiple cervical and lumbar surgeries, L knee and left shoulder arthroscopy.    Clinical Impression  Pt is POD #0 and is mobilizing well with very little assist from PT.  I anticipate he will progress well enough to go home with his wife's assistance at d/c.  PT to follow acutely for deficits listed below.       Follow Up Recommendations Home health PT;Supervision for mobility/OOB    Equipment Recommendations  None recommended by PT    Recommendations for Other Services       Precautions / Restrictions Precautions Precautions: Knee Precaution Booklet Issued: Yes (comment) Precaution Comments: exercise handout given  Restrictions Weight Bearing Restrictions: Yes RLE Weight Bearing: Partial weight bearing RLE Partial Weight Bearing Percentage or Pounds: 50      Mobility  Bed Mobility Overal bed mobility: Needs Assistance Bed Mobility: Supine to Sit     Supine to sit: Min guard     General bed mobility comments: min guard assist of right leg to get to EOB.   Transfers Overall transfer level: Needs assistance Equipment used: Rolling walker (2 wheeled) Transfers: Sit to/from Stand Sit to Stand: Min guard         General transfer comment: min guard assist for safety during transitions, verbal cues for safe hand placement.   Ambulation/Gait Ambulation/Gait assistance: Min guard Ambulation Distance (Feet): 10 Feet Assistive device: Rolling walker (2 wheeled) Gait Pattern/deviations: Step-to pattern;Antalgic     General Gait Details: verbal cues for 50% WB through right leg and LE sequencing as well as safe use of RW.  Min guard assist for safety and balance.                     Balance Overall balance assessment: Needs assistance Sitting-balance support: Feet supported;No upper extremity supported Sitting balance-Leahy Scale: Good     Standing balance support: Bilateral upper extremity supported Standing balance-Leahy Scale: Poor                               Pertinent Vitals/Pain Pain Assessment: 0-10 Pain Score: 7  Pain Location: right knee Pain Descriptors / Indicators: Aching;Burning Pain Intervention(s): Limited activity within patient's tolerance;Monitored during session;Repositioned    Home Living Family/patient expects to be discharged to:: Private residence Living Arrangements: Spouse/significant other Available Help at Discharge: Family;Available 24 hours/day Type of Home: House Home Access: Stairs to enter Entrance Stairs-Rails: Psychiatric nurse of Steps: 3 Home Layout: One level Home Equipment: Walker - 2 wheels;Bedside commode Additional Comments: wife is a PTA    Prior Function Level of Independence: Independent         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        Extremity/Trunk Assessment   Upper Extremity Assessment: Defer to OT evaluation           Lower Extremity Assessment: RLE deficits/detail RLE Deficits / Details: right leg with normal post op pain and weakness, ankle at least 3/5, knee, 2+/5, hip flexion 3-/5    Cervical / Trunk Assessment: Other exceptions  Communication   Communication: No difficulties  Cognition Arousal/Alertness: Awake/alert Behavior During Therapy: [facility] for  tasks assessed/performed Overall Cognitive Status: Within Functional Limits for tasks assessed                         Exercises Total Joint Exercises Ankle Circles/Pumps: AROM;Both;20 reps;Supine      Assessment/Plan    PT Assessment Patient needs continued PT services  PT Diagnosis Difficulty walking;Abnormality of gait;Generalized weakness;Acute pain    PT Problem List Decreased strength;Decreased range of motion;Decreased balance;Decreased activity tolerance;Decreased mobility;Decreased knowledge of use of DME;Decreased knowledge of precautions;Pain  PT Treatment Interventions DME instruction;Gait training;Stair training;Functional mobility training;Therapeutic activities;Therapeutic exercise;Balance training;Neuromuscular re-education;Patient/family education;Manual techniques;Modalities   PT Goals (Current goals can be found in the Care Plan section) Acute Rehab PT Goals Patient Stated Goal: to go home PT Goal Formulation: With patient/family Time For Goal Achievement: 07/30/15 Potential to Achieve Goals: Good    Frequency 7X/week           End of Session   Activity Tolerance: Patient limited by pain Patient left: in chair;with call bell/phone within reach;with family/visitor present Nurse Communication: Mobility status;Other (comment) (ok for wife to mobilize pt in room)         Time: 1815-1840 PT Time Calculation (min) (ACUTE ONLY): 25 min   Charges:   PT Evaluation $Initial PT Evaluation Tier I: 1 Procedure          Earlean Fidalgo B. Trenda Corliss, PT, DPT [phone]   07/23/2015, 10:50 PM

## 2015-07-23 NOTE — Anesthesia Procedure Notes (Addendum)
Anesthesia Regional Block:  Adductor canal block  Pre-Anesthetic Checklist: ,, timeout performed, Correct Patient, Correct Site, Correct Laterality, Correct Procedure, Correct Position, site marked, Risks and benefits discussed,  Surgical consent,  Pre-op evaluation,  At surgeon's request and post-op pain management  Laterality: Right  Prep: chloraprep       Needles:   Needle Type: Echogenic Stimulator Needle     Needle Length: 9cm 9 cm Needle Gauge: 21 and 21 G    Additional Needles:  Procedures: ultrasound guided (picture in chart) Adductor canal block Narrative:  Start time: 07/23/2015 9:25 AM End time: 07/23/2015 9:30 AM Injection made incrementally with aspirations every 5 mL.  Performed by: Personally   Additional Notes: 30 cc 0.5% bupivacaine 1:200 epi injected easily   Procedure Name: LMA Insertion Date/Time: 07/23/2015 10:46 AM Performed by: Williemae Area B Pre-anesthesia Checklist: Emergency Drugs available, Patient identified, Suction available and Patient being monitored Patient Re-evaluated:Patient Re-evaluated prior to inductionOxygen Delivery Method: Circle system utilized Preoxygenation: Pre-oxygenation with 100% oxygen Intubation Type: IV induction Ventilation: Mask ventilation without difficulty LMA: LMA inserted LMA Size: 5.0 Number of attempts: 1 Placement Confirmation: breath sounds checked- equal and bilateral and positive ETCO2 Tube secured with: Tape (taped across cheeks; gauze roll b/t teeth) Dental Injury: Teeth and Oropharynx as per pre-operative assessment

## 2015-07-24 ENCOUNTER — Encounter (HOSPITAL_COMMUNITY): Payer: Self-pay | Admitting: Orthopaedic Surgery

## 2015-07-24 LAB — BASIC METABOLIC PANEL
Anion gap: 10 (ref 5–15)
BUN: 7 mg/dL (ref 6–20)
CO2: 29 mmol/L (ref 22–32)
Calcium: 8.2 mg/dL — ABNORMAL LOW (ref 8.9–10.3)
Chloride: 97 mmol/L — ABNORMAL LOW (ref 101–111)
Creatinine, Ser: 0.84 mg/dL (ref 0.61–1.24)
GFR calc Af Amer: >60 mL/min (ref >60)
GFR calc non Af Amer: >60 mL/min (ref >60)
Glucose, Bld: 123 mg/dL — ABNORMAL HIGH (ref 65–99)
Potassium: 4.1 mmol/L (ref 3.5–5.1)
Sodium: 136 mmol/L (ref 135–145)

## 2015-07-24 LAB — CBC
HCT: 42.8 % (ref 39.0–52.0)
Hemoglobin: 14.4 g/dL (ref 13.0–17.0)
MCH: 31.6 pg (ref 26.0–34.0)
MCHC: 33.6 g/dL (ref 30.0–36.0)
MCV: 94.1 fL (ref 78.0–100.0)
Platelets: 132 10*3/uL — ABNORMAL LOW (ref 150–400)
RBC: 4.55 MIL/uL (ref 4.22–5.81)
RDW: 13.7 % (ref 11.5–15.5)
WBC: 9 10*3/uL (ref 4.0–10.5)

## 2015-07-24 MED ORDER — HYDROMORPHONE HCL 2 MG PO TABS
2.0000 mg | ORAL_TABLET | ORAL | Status: DC | PRN
  Administered 2015-07-24 – 2015-07-25 (×4): 2 mg via ORAL
  Filled 2015-07-24 (×4): qty 1

## 2015-07-24 NOTE — Progress Notes (Signed)
Physical Therapy Treatment Patient Details Name: Larry [name] MRN: [id number] DOB: 03/05/1951 Today's Date: 07/24/2015    History of Present Illness 64 y.o. male admitted to [facility] on 07/23/15 for elective R TKA.  Pt with significant PMHx of HA, asthma, DDD of cervical and lumbar spine, urinary frequency, multiple cervical and lumbar surgeries, L knee and left shoulder arthroscopy.      PT Comments    The pt. was in a greater amount of pain compared to this morning's treatment.  He was still able to complete a few seated exercises and ambulate for 100 ft. During treatment.  He has met the goals regarding bed mobility and sit <-> stand transfers.  He was not able to complete stair training due to his pain level but should be able to begin stair training tomorrow.  Wife was not present this treatment.     Follow Up Recommendations  Supervision for mobility/OOB;Other (comment) (wife will most likely perform HHPT)     Equipment Recommendations  None recommended by PT       Precautions / Restrictions Precautions Precautions: Knee Precaution Booklet Issued: Yes (comment) Precaution Comments: exercise handout reviewed Restrictions Weight Bearing Restrictions: Yes RLE Weight Bearing: Partial weight bearing RLE Partial Weight Bearing Percentage or Pounds: 50    Mobility  Bed Mobility Overal bed mobility: Modified Independent Bed Mobility: Supine to Sit;Sit to Supine     Supine to sit: Modified independent (Device/Increase time);HOB elevated Sit to supine: Modified independent (Device/Increase time)   General bed mobility comments: Modified independent as pt. had to use bed rail to pull himself up  Transfers Overall transfer level: Needs assistance Equipment used: Rolling walker (2 wheeled) Transfers: Sit to/from Stand Sit to Stand: Supervision         General transfer comment: Supervision for safety due to pain during treatment; verbal cues for slow speed with sitting,  pt. still "plopped" down on to the bed at end of treatment   Ambulation/Gait Ambulation/Gait assistance: Supervision Ambulation Distance (Feet): 100 Feet Assistive device: Rolling walker (2 wheeled) Gait Pattern/deviations: Step-to pattern;Antalgic;Decreased stance time - right     General Gait Details: supervision for safety; verbal cues to increase right stance time while maintaining WB status      Balance Overall balance assessment: Needs assistance Sitting-balance support: Feet supported Sitting balance-Leahy Scale: Good     Standing balance support: Bilateral upper extremity supported;During functional activity Standing balance-Leahy Scale: Fair Standing balance comment: RW for support                    Cognition Arousal/Alertness: Awake/alert Behavior During Therapy: WFL for tasks assessed/performed Overall Cognitive Status: Within Functional Limits for tasks assessed                      Exercises Total Joint Exercises Heel Slides: AROM;Right;10 reps;Seated Long Arc Quad: Strengthening;Right;10 reps;Seated;Other (comment) (5 sec hold)    General Comments        Pertinent Vitals/Pain Pain Assessment: 0-10 Pain Score: 7  Pain Location: right knee Pain Descriptors / Indicators: Aching;Burning;Constant;Grimacing;Tender Pain Intervention(s): Limited activity within patient's tolerance;Monitored during session;Repositioned;Premedicated before session;Ice applied           PT Goals (current goals can now be found in the care plan section) Acute Rehab PT Goals Patient Stated Goal: to go home Progress towards PT goals: Progressing toward goals    Frequency  Min 6X/week    PT Plan Current plan remains appropriate  End of Session Equipment Utilized During Treatment: Gait belt Activity Tolerance: Patient tolerated treatment well;Patient limited by pain Patient left: in bed;with call bell/phone within reach     Time: 3868-5488 PT  Time Calculation (min) (ACUTE ONLY): 27 min  Charges: 1 Gait, 1 TE                   G CodesDamaris Hippo  [phone] - Office  07/24/2015, 5:27 PM

## 2015-07-24 NOTE — Op Note (Signed)
NAMEDEBORAH, Larry [name] NO.:  [id number]  MEDICAL RECORD NO.:  [id number]  LOCATION:  5N29C                        FACILITY:  [facility]  PHYSICIAN:  Vonna Kotyk. Whitfield, M.D.DATE OF BIRTH:  10-30-1950  DATE OF PROCEDURE:  07/23/2015 DATE OF DISCHARGE:                              OPERATIVE REPORT   PREOPERATIVE DIAGNOSIS:  Primary, end-stage osteoarthritis, right knee.  POSTOPERATIVE DIAGNOSIS:  Primary, end-stage osteoarthritis, right knee.  PROCEDURE:  Right total knee arthroplasty.  SURGEON:  Vonna Kotyk. Durward Fortes, M.D.  ASSISTANT:  Aaron Edelman D. Petrarca, PA-C.  ANESTHESIA:  General with supplemental femoral nerve block.  COMPLICATIONS:  None.  COMPONENTS:  DePuy LCS standard femoral component #4 rotating keeled tibial tray with a 10-mm polyethylene bridging bearing, metal back rotating 3-pegged patella.  Components were secured with polymethyl methacrylate.  DESCRIPTION OF PROCEDURE:  Mr. Larry [name] was met in the holding area.  I identified the right knee as the appropriate operative site and marked it accordingly.  He did receive a preoperative femoral nerve block per Anesthesia.  He was then transported to room #7 and placed under general anesthesia after failed to the spinal attempt per Anesthesia.  The right lower extremity was then placed in a thigh tourniquet.  The leg was prepped with chlorhexidine scrub and DuraPrep x2.  Sterile draping was performed.  Time-out was called.  The extremity was elevated, was Esmarch exsanguinated with a proximal tourniquet at 350 mmHg.  A midline longitudinal incision was made centered about the patella extending from the superior pouch to tibial tubercle.  Via sharp dissection, incision was carried down to subcutaneous tissue.  Any small bleeders were Bovie coagulated.  First layer of capsule was incised in the midline.  A medial parapatellar incision was then made with the Bovie.  The joint was entered.  There was a  minimal clear yellow joint effusion.  Patella was everted at 180 degrees laterally, knee flexed to 90 degrees.  There was a preoperative fixed flexure contracture with probably 5 to 7 degrees, and there was a varus position to the anchor just about correct to neutral.  I did perform a medial release, so I could place the knee neutral without difficulty.  There were large osteophytes along the medial and lateral femoral condyles.  Complete absence of articular cartilage in the medial femoral condyle and medial tibial plateau.  Small osteophytes along the medial tibial plateau. Osteophytes were removed so I could template a standard femoral component.  First bony cut was made transversely in the proximal tibia with a 7- degree angle of declination.  I checked each bony cut on the tibia and the femur with my external guide.  The standard femoral jig was then applied.  Made my anterior and posterior cuts.  Lamina spreader was placed along the medial and lateral femoral condyle, removed the medial and lateral menisci as well as ACL and PCL.  Osteophytes were removed from the posterior femoral condyles with a curved 3/4 inch osteotome.  Distal femoral cutting guide was then applied at 4-degree of valgus. Osteotomy was made along the distal femur with flexion/extension gaps perfectly symmetrical at 10 mm.  The finishing guide was applied to the femur, obtained the  tapering cuts in the center hole.  Retractors were then placed about the tibia and measured a #4 tibial tray.  This was pinned in place.  Center hole was then made followed by the keeled cut.  With the tibial tray in place, the 10 mm polyethylene bridging bearing was applied followed by the trial femoral component. The knee was reduced through a full range of motion, had full extension, no opening of varus or valgus stress, had nice alignment of the knee. There was no malrotation of the tibial component.  MCL and LCL  remained intact.  The patella was prepared by removing 10 mm of bone leaving 15 mm patellar thickness.  Three holes were made for the trial patella, this was inserted, reduced, and through full range of motion was stable.  The trial components were then removed.  Joint was copiously irrigated with saline solution.  The final components were then impacted with polymethyl methacrylate.  We initially applied the #4 rotating keeled tibial tray followed by the 10-mm polyethylene bridging bearing and then the standard femoral component.  This was impacted in place.  The knee was extended.  Components appeared to be in good position.  Extraneous methacrylate was removed from the components.  The patella was applied with methacrylate and a patellar clamp.  At approximately 16 minutes the methacrylate had matured during which time we injected the joint with 0.25% Marcaine with epinephrine.  At approximately 77 minutes, the tourniquet was deflated.  We did apply tranexamic acid topically to the joint for about 4 minutes.  Any gross bleeders were Bovie coagulated.  We had a nice dry field.  At closure, had nice capillary refill.  A medium-size Hemovac was placed in the lateral compartment.  The deep capsule was closed with #1 Ethibond, superficial capsule with 0- Vicryl, subcu with 3-0 Monocryl, skin closed with skin clips.  Sterile bulky dressing was applied followed by the patient's support stocking.  The patient was awoken, returned to the postanesthesia recovery room without complications.     Vonna Kotyk. Durward Fortes, M.D.     PWW/MEDQ  D:  07/23/2015  T:  07/24/2015  Job:  [id number]

## 2015-07-24 NOTE — Discharge Instructions (Signed)
Information on my medicine - XARELTO® (Rivaroxaban) ° °This medication education was reviewed with me or my healthcare representative as part of my discharge preparation.  The pharmacist that spoke with me during my hospital stay was:  Jenan Ellegood Kay, RPH ° °Why was Xarelto® prescribed for you? °Xarelto® was prescribed for you to reduce the risk of blood clots forming after orthopedic surgery. The medical term for these abnormal blood clots is venous thromboembolism (VTE). ° °What do you need to know about xarelto® ? °Take your Xarelto® ONCE DAILY at the same time every day. °You may take it either with or without food. ° °If you have difficulty swallowing the tablet whole, you may crush it and mix in applesauce just prior to taking your dose. ° °Take Xarelto® exactly as prescribed by your doctor and DO NOT stop taking Xarelto® without talking to the doctor who prescribed the medication.  Stopping without other VTE prevention medication to take the place of Xarelto® may increase your risk of developing a clot. ° °After discharge, you should have regular check-up appointments with your healthcare provider that is prescribing your Xarelto®.   ° °What do you do if you miss a dose? °If you miss a dose, take it as soon as you remember on the same day then continue your regularly scheduled once daily regimen the next day. Do not take two doses of Xarelto® on the same day.  ° °Important Safety Information °A possible side effect of Xarelto® is bleeding. You should call your healthcare provider right away if you experience any of the following: °? Bleeding from an injury or your nose that does not stop. °? Unusual colored urine (red or dark brown) or unusual colored stools (red or black). °? Unusual bruising for unknown reasons. °? A serious fall or if you hit your head (even if there is no bleeding). ° °Some medicines may interact with Xarelto® and might increase your risk of bleeding while on Xarelto®. To help avoid  this, consult your healthcare provider or pharmacist prior to using any new prescription or non-prescription medications, including herbals, vitamins, non-steroidal anti-inflammatory drugs (NSAIDs) and supplements. ° °This website has more information on Xarelto®: www.xarelto.com. ° ° °

## 2015-07-24 NOTE — Progress Notes (Addendum)
Patient ID: Larry [name], male   DOB: 1950/12/10, 64 y.o.   MRN: [id number] PATIENT ID: Larry [name]        MRN:  [id number]          DOB/AGE: 07/14/1951 / 64 y.o.    Joni Fears, MD   Biagio Borg, PA-C [address] [place], [place]  [address]                             [phone]   PROGRESS NOTE  Subjective:  negative for Chest Pain  negative for Shortness of Breath  positive for Nausea/Vomiting yesterday, done since  negative for Calf Pain    Tolerating Diet: yes         Patient reports pain as mild and moderate.     Oxycodone according to patient not as effective.  Tolerated IV Dilaudid  Objective: Vital signs in last 24 hours:   Patient Vitals for the past 24 hrs:  BP Temp Temp src Pulse Resp SpO2 Height Weight  07/24/15 0638 (!) 161/75 mmHg 97.9 F (36.6 C) Oral 81 16 97 % - -  07/24/15 0049 (!) 172/85 mmHg 97.9 F (36.6 C) Oral 88 18 98 % - -  07/23/15 2017 (!) 176/90 mmHg 98.1 F (36.7 C) Oral 84 18 96 % - -  07/23/15 1624 (!) 160/93 mmHg 97.9 F (36.6 C) - 79 14 98 % - -  07/23/15 1600 - 98.6 F (37 C) - 69 (!) 9 97 % - -  07/23/15 1557 (!) 156/85 mmHg - - 75 (!) 9 99 % - -  07/23/15 1549 (!) 177/78 mmHg - - 65 13 96 % - -  07/23/15 1547 (!) 188/95 mmHg - - 80 14 96 % - -  07/23/15 1545 - - - 74 15 97 % - -  07/23/15 1542 (!) 171/94 mmHg - - - - - - -  07/23/15 1532 (!) 171/94 mmHg - - (!) 55 12 95 % - -  07/23/15 1530 - - - 69 18 93 % - -  07/23/15 1522 (!) 179/96 mmHg - - - - - - -  07/23/15 1517 (!) 179/96 mmHg - - 69 16 99 % - -  07/23/15 1515 - - - (!) 53 12 96 % - -  07/23/15 1510 (!) 168/95 mmHg - - 63 12 99 % - -  07/23/15 1502 (!) 186/83 mmHg - - 67 17 100 % - -  07/23/15 1500 - - - (!) 56 14 100 % - -  07/23/15 1459 (!) 164/96 mmHg - - 70 15 100 % - -  07/23/15 1449 (!) 179/86 mmHg - - 66 15 100 % - -  07/23/15 1447 (!) 178/91 mmHg - - 69 12 99 % - -  07/23/15 1445 - - - 64 (!) 9 100 % - -  07/23/15 1432 (!) 162/99 mmHg - -  66 16 100 % - -  07/23/15 1430 - - - 68 15 100 % - -  07/23/15 1417 (!) 175/90 mmHg - - (!) 59 10 100 % - -  07/23/15 1415 - - - (!) 52 15 100 % - -  07/23/15 1402 (!) 152/84 mmHg - - 73 18 100 % - -  07/23/15 1400 - - - 63 14 100 % - -  07/23/15 1351 (!) 173/86 mmHg - - (!) 56 11 100 % - -  07/23/15 1347 Marland Kitchen)  167/89 mmHg - - (!) 55 12 100 % - -  07/23/15 1345 - - - (!) 52 13 100 % - -  07/23/15 1332 (!) 179/88 mmHg - - 73 17 100 % - -  07/23/15 1330 - - - (!) 58 15 99 % - -  07/23/15 1317 (!) 167/92 mmHg - - 63 12 100 % - -  07/23/15 1315 - - - 72 (!) 23 100 % - -  07/23/15 1314 (!) 182/82 mmHg - - 71 14 100 % - -  07/23/15 1312 (!) 191/85 mmHg - - 66 15 100 % - -  07/23/15 1300 (!) 182/82 mmHg 97.9 F (36.6 C) - 85 16 97 % - -  07/23/15 0952 (!) 150/90 mmHg - - 67 12 99 % - -  07/23/15 0951 - - - 66 18 99 % - -  07/23/15 0950 - - - 76 20 100 % - -  07/23/15 0948 - - - 70 19 100 % - -  07/23/15 0947 (!) 148/77 mmHg - - 62 18 98 % - -  07/23/15 0945 - - - 62 13 98 % - -  07/23/15 0942 (!) 163/74 mmHg - - 61 13 98 % - -  07/23/15 0940 (!) 173/74 mmHg - - 67 14 99 % - -  07/23/15 0937 - - - 67 16 100 % - -  07/23/15 0935 (!) 173/74 mmHg - - 71 16 99 % - -  07/23/15 0932 (!) 169/77 mmHg - - 72 18 98 % - -  07/23/15 0930 (!) 169/77 mmHg - - 75 16 100 % - -  07/23/15 0927 - - - 71 (!) 25 98 % - -  07/23/15 0925 (!) 158/90 mmHg - - 73 (!) 22 99 % - -  07/23/15 0924 - - - 70 20 100 % - -  07/23/15 0922 (!) 160/92 mmHg - - 62 16 99 % - -  07/23/15 0920 - - - 62 15 97 % - -  07/23/15 0917 (!) 191/94 mmHg - - 62 (!) 22 99 % - -  07/23/15 0915 - - - 60 12 100 % - -  07/23/15 0912 (!) 182/106 mmHg - - 60 17 100 % - -  07/23/15 0910 - - - (!) 55 14 100 % - -  07/23/15 0908 (!) 178/93 mmHg - - (!) 55 18 100 % - -  07/23/15 0907 - - - (!) 53 12 100 % - -  07/23/15 0905 - - - (!) 58 18 100 % - -  07/23/15 0839 - - - - - - 5\' 6"  (1.676 m) -  07/23/15 0811 (!) 161/88 mmHg 97 F (36.1 C) Oral  (!) 58 20 97 % - 85.276 kg (188 lb)      Intake/Output from previous day:   10/25 0701 - 10/26 0700 In: 3367.5 [I.V.:3067.5] Out: 2025 [Urine:1450; Drains:500]   Intake/Output this shift:       Intake/Output      10/25 0701 - 10/26 0700 10/26 0701 - 10/27 0700   I.V. (mL/kg) 3067.5 (36)    IV Piggyback 300    Total Intake(mL/kg) 3367.5 (39.5)    Urine (mL/kg/hr) 1450    Drains 500    Blood 75    Total Output 2025     Net +1342.5             LABORATORY DATA:  Recent Labs  07/24/15 0320  WBC 9.0  HGB  14.4  HCT 42.8  PLT 132*    Recent Labs  07/24/15 0320  NA 136  K 4.1  CL 97*  CO2 29  BUN 7  CREATININE 0.84  GLUCOSE 123*  CALCIUM 8.2*   Lab Results  Component Value Date   INR 1.15 07/10/2015    Recent Radiographic Studies :  Dg Chest 2 View  07/10/2015  CLINICAL DATA:  Preoperative examination prior to right total knee joint replacement, history of smoking in the past. EXAM: CHEST  2 VIEW COMPARISON:  PA and lateral chest x-ray dated November 26, 2010 FINDINGS: The lungs are adequately inflated. The interstitial markings are mildly prominent though stable. The heart is top-normal in size. The pulmonary vascularity is normal. The mediastinum is normal in width. There is tortuosity of the ascending and descending thoracic aorta. There is no pleural effusion. There is mild multilevel degenerative disc disease of the thoracic spine. IMPRESSION: There is no active cardiopulmonary disease. Mild interstitial prominence likely reflects the patient's smoking history. Electronically Signed   By: David  Martinique M.D.   On: 07/10/2015 16:05     Examination:  General appearance: alert, cooperative, mild distress and moderate distress Resp: clear to auscultation bilaterally Cardio: regular rate and rhythm GI: normal findings: bowel sounds normal  Wound Exam: clean, dry, intact dressing  Drainage:  None: wound tissue dry  Motor Exam: EHL, FHL, Anterior Tibial and  Posterior Tibial Intact  Sensory Exam: Superficial Peroneal, Deep Peroneal and Tibial normal  Vascular Exam: Right posterior tibial artery has 1+ (weak) pulse  Assessment:    1 Day Post-Op  Procedure(s) (LRB): TOTAL KNEE ARTHROPLASTY (Right)  ADDITIONAL DIAGNOSIS:  Principal Problem:   Primary osteoarthritis of right knee Active Problems:   Primary osteoarthritis of knee     Plan: Physical Therapy as ordered Partial Weight Bearing @ 50% (PWB)  DVT Prophylaxis:  Xarelto, Foot Pumps and TED hose  DISCHARGE PLAN: Home  DISCHARGE NEEDS: HHPT, CPM, Walker and 3-in-1 comode seat   Will try PO Dilaudid instead of Oxycodone.        Gianny Killman  07/24/2015 8:06 AM

## 2015-07-24 NOTE — Progress Notes (Signed)
Physical Therapy Treatment Patient Details Name: Larry [name] MRN: [id number] DOB: 03-Aug-1951 Today's Date: 07/24/2015    History of Present Illness 64 y.o. male admitted to [facility] on 07/23/15 for elective R TKA.  Pt with significant PMHx of HA, asthma, DDD of cervical and lumbar spine, urinary frequency, multiple cervical and lumbar surgeries, L knee and left shoulder arthroscopy.      PT Comments    The pt. was able to ambulate for a longer distance without increase in pain and was able to maintain WB status.  He performed all lying exercises and appeared to understand them well. Will most likely begin working on stairs this afternoon.  Wife was present during treatment and stated that she will be performing his HHPT, as she is a home health PTA.  Case manager was notified of this change.    Follow Up Recommendations  Supervision for mobility/OOB;Other (comment) (Wife (PTA) will perform HHPT)     Equipment Recommendations  None recommended by PT       Precautions / Restrictions Precautions Precautions: Knee Precaution Booklet Issued: Yes (comment) Precaution Comments: exercise handout reviewed Restrictions Weight Bearing Restrictions: Yes RLE Weight Bearing: Partial weight bearing RLE Partial Weight Bearing Percentage or Pounds: 50    Mobility   Transfers Overall transfer level: Needs assistance Equipment used: Rolling walker (2 wheeled) Transfers: Sit to/from Stand Sit to Stand: Min guard         General transfer comment: min guard assist for safety during transitions, verbal cues to slowly sit down.   Ambulation/Gait Ambulation/Gait assistance: Min guard Ambulation Distance (Feet): 100 Feet Assistive device: Rolling walker (2 wheeled) Gait Pattern/deviations: Step-to pattern;Antalgic     General Gait Details: min guard assist for safety and balance; verbal cues to maintain WB precautions       Balance Overall balance assessment: Needs  assistance Sitting-balance support: Feet supported;Bilateral upper extremity supported Sitting balance-Leahy Scale: Good     Standing balance support: Bilateral upper extremity supported Standing balance-Leahy Scale: Poor                      Cognition Arousal/Alertness: Awake/alert Behavior During Therapy: WFL for tasks assessed/performed Overall Cognitive Status: Within Functional Limits for tasks assessed                      Exercises Total Joint Exercises Ankle Circles/Pumps: AROM;Both;20 reps;Seated Quad Sets: Strengthening;Right;10 reps;Seated;Other (comment) (5 sec hold) Towel Squeeze: Strengthening;Both;10 reps;Other (comment) (5 sec hold) Short Arc Quad: Strengthening;Right;10 reps;Seated;Other (comment) (5 sec hold) Heel Slides: AROM;Right;10 reps;Seated (cues to try not to use hands) Hip ABduction/ADduction: Strengthening;AROM;Right;10 reps;Seated Straight Leg Raises: Strengthening;Right;10 reps;Seated;Other (comment) (5 sec hold) Goniometric ROM: 11-81 (long sitting in recliner - AROM)    General Comments General comments (skin integrity, edema, etc.): BP reading at beginning of treatment was lower than it had been since surgery      Pertinent Vitals/Pain Pain Assessment: 0-10 Pain Score: 5  Pain Location: right knee Pain Descriptors / Indicators: Aching;Burning Pain Intervention(s): Limited activity within patient's tolerance;Monitored during session;Repositioned           PT Goals (current goals can now be found in the care plan section) Acute Rehab PT Goals Patient Stated Goal: to go home Progress towards PT goals: Progressing toward goals    Frequency  7X/week    PT Plan Current plan remains appropriate       End of Session Equipment Utilized During Treatment: Gait belt Activity Tolerance:  Patient tolerated treatment well;No increased pain Patient left: in chair;with call bell/phone within reach;with family/visitor present      Time: 9147-8295 PT Time Calculation (min) (ACUTE ONLY): 29 min  Charges:  1 gait, 1 TE                   G CodesDamaris Hippo  [phone] - Office   07/24/2015, 10:52 AM

## 2015-07-24 NOTE — Care Management Note (Signed)
Case Management Note  Patient Details  Name: Larry [name] MRN: [id number] Date of Birth: 1950-10-20  Subjective/Objective:     S/p right total knee arthroplasty               Action/Plan: Set up with [facility] for HHPT by MD office. Spoke with patient, he thinks that his wife, who is a Materials engineer, will be able to perform his home PT. Contacted Ronalda Walpole with [facility], who contacted Dr. Durward Fortes, the patient's wife will be giving patient's home PT. Patient stated that [employer] has delivered CPM, rolling walker and 3N1 to his home.      Expected Discharge Date:                  Expected Discharge Plan:  [place]  In-House Referral:  NA  Discharge planning Services  CM Consult  Post Acute Care Choice:  Durable Medical Equipment, Home Health Choice offered to:  Patient  DME Arranged:  3-N-1, CPM, Walker rolling DME Agency:  [employer]  HH Arranged:  PT HH Agency:  [facility]  Status of Service:  In process, will continue to follow  Medicare Important Message Given:    Date Medicare IM Given:    Medicare IM give by:    Date Additional Medicare IM Given:    Additional Medicare Important Message give by:     If discussed at [place] of Stay Meetings, dates discussed:    Additional Comments:  Nila Nephew, RN 07/24/2015, 11:21 AM

## 2015-07-24 NOTE — Evaluation (Signed)
Occupational Therapy Evaluation Patient Details Name: Larry [name] MRN: [id number] DOB: Jul 26, 1951 Today's Date: 07/24/2015    History of Present Illness 64 y.o. male admitted to [facility] on 07/23/15 for elective R TKA.  Pt with significant PMHx of HA, asthma, DDD of cervical and lumbar spine, urinary frequency, multiple cervical and lumbar surgeries, L knee and left shoulder arthroscopy.     Clinical Impression   Pt reports he was independent with ADLs and mobility PTA. Currently pt is min guard overall for safety during ADLs and functional mobility. All education complete at this time; pt with no further questions or concerns for OT. Pt plan to d/c home with 24/7 supervision from his wife. Pt ready to d/c from an OT standpoint, signing off at this time. Thank you for this referral.     Follow Up Recommendations  No OT follow up;Supervision - Intermittent    Equipment Recommendations  None recommended by OT (pt reports 3 in 1 has been delivered to home already)    Recommendations for Other Services       Precautions / Restrictions Precautions Precautions: Knee Precaution Booklet Issued: Yes (comment) Precaution Comments: exercise handout reviewed Restrictions Weight Bearing Restrictions: Yes RLE Weight Bearing: Partial weight bearing RLE Partial Weight Bearing Percentage or Pounds: 50      Mobility Bed Mobility               General bed mobility comments: Pt in recliner, returned to recliner at end of session  Transfers Overall transfer level: Needs assistance Equipment used: Rolling walker (2 wheeled) Transfers: Sit to/from Stand Sit to Stand: Min guard         General transfer comment: Min guard for safety. Good hand placement, VC for lowering self down when sitting. Sit <> stand from chair x 1. Able to verbalize WB precautions and adhere to them during functional activity.    Balance Overall balance assessment: Needs assistance Sitting-balance support:  Feet supported Sitting balance-Leahy Scale: Good     Standing balance support: Bilateral upper extremity supported Standing balance-Leahy Scale: Poor Standing balance comment: RW for support                            ADL Overall ADL's : Needs assistance/impaired Eating/Feeding: Set up;Sitting   Grooming: Min guard;Standing               Lower Body Dressing: Min guard;Sit to/from stand Lower Body Dressing Details (indicate cue type and reason): Min guard for safety with sit <> stand Toilet Transfer: Min guard;Ambulation;BSC;RW (BSC over toilet)       Tub/ Shower Transfer: Min guard;Ambulation;Rolling walker;Walk-in shower   Functional mobility during ADLs: Min guard;Rolling walker General ADL Comments: No family present during OT eval. Educated pt on compensatory strategies for LB ADLs, edema management techniques, home safety, need for close supervision during ADLs and functional mobility; pt verbalized understanding. Educated and demonstrated walk in shower transfer technique; pt return demonstrated understanding.     Vision     Perception     Praxis      Pertinent Vitals/Pain Pain Assessment: 0-10 Pain Score: 7  Pain Location: R knee with movement Pain Descriptors / Indicators: Aching;Grimacing;Sore Pain Intervention(s): Limited activity within patient's tolerance;Monitored during session;Repositioned;Patient requesting pain meds-RN notified;Ice applied     Hand Dominance     Extremity/Trunk Assessment Upper Extremity Assessment Upper Extremity Assessment: Overall WFL for tasks assessed   Lower Extremity Assessment Lower Extremity Assessment:  Defer to PT evaluation   Cervical / Trunk Assessment Cervical / Trunk Assessment: Other exceptions Cervical / Trunk Exceptions: h/o multiple cervical and lumbar surgeries   Communication Communication Communication: No difficulties   Cognition Arousal/Alertness: Awake/alert Behavior During Therapy:  WFL for tasks assessed/performed Overall Cognitive Status: Within Functional Limits for tasks assessed                     General Comments       Exercises Exercises: Total Joint     Shoulder Instructions      Home Living Family/patient expects to be discharged to:: Private residence Living Arrangements: Spouse/significant other Available Help at Discharge: Family;Available 24 hours/day Type of Home: House Home Access: Stairs to enter [employer] of Steps: 3 Entrance Stairs-Rails: Right;Left Home Layout: One level     Bathroom Shower/Tub: Occupational psychologist: Handicapped height Bathroom Accessibility: Yes How Accessible: Accessible via walker Home Equipment: [employer] - 2 wheels;Bedside commode   Additional Comments: wife is a PTA      Prior Functioning/Environment Level of Independence: Independent        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    OT Diagnosis: Acute pain   OT Problem List:     OT Treatment/Interventions:      OT Goals(Current goals can be found in the care plan section) Acute Rehab OT Goals Patient Stated Goal: to go home OT Goal Formulation: With patient  OT Frequency:     Barriers to D/C:            Co-evaluation              End of Session Equipment Utilized During Treatment: Gait belt;Rolling walker CPM Right Knee CPM Right Knee: Off Nurse Communication: Patient requests pain meds  Activity Tolerance: Patient tolerated treatment well Patient left: in chair;with call bell/phone within reach   Time: 9509-3267 OT Time Calculation (min): 17 min Charges:  OT General Charges $OT Visit: 1 Procedure OT Evaluation $Initial OT Evaluation Tier I: 1 Procedure G-Codes:     Binnie Kand M.S., OTR/L Pager: [phone]  07/24/2015, 12:18 PM

## 2015-07-25 DIAGNOSIS — E039 Hypothyroidism, unspecified: Secondary | ICD-10-CM

## 2015-07-25 LAB — BASIC METABOLIC PANEL
Anion gap: 6 (ref 5–15)
BUN: 7 mg/dL (ref 6–20)
CO2: 30 mmol/L (ref 22–32)
Calcium: 7.8 mg/dL — ABNORMAL LOW (ref 8.9–10.3)
Chloride: 95 mmol/L — ABNORMAL LOW (ref 101–111)
Creatinine, Ser: 0.75 mg/dL (ref 0.61–1.24)
GFR calc Af Amer: >60 mL/min (ref >60)
GFR calc non Af Amer: >60 mL/min (ref >60)
Glucose, Bld: 138 mg/dL — ABNORMAL HIGH (ref 65–99)
Potassium: 3.6 mmol/L (ref 3.5–5.1)
Sodium: 131 mmol/L — ABNORMAL LOW (ref 135–145)

## 2015-07-25 LAB — CBC
HCT: 37.4 % — ABNORMAL LOW (ref 39.0–52.0)
Hemoglobin: 12.6 g/dL — ABNORMAL LOW (ref 13.0–17.0)
MCH: 31.3 pg (ref 26.0–34.0)
MCHC: 33.7 g/dL (ref 30.0–36.0)
MCV: 93 fL (ref 78.0–100.0)
Platelets: 127 10*3/uL — ABNORMAL LOW (ref 150–400)
RBC: 4.02 MIL/uL — ABNORMAL LOW (ref 4.22–5.81)
RDW: 13.4 % (ref 11.5–15.5)
WBC: 8 10*3/uL (ref 4.0–10.5)

## 2015-07-25 MED ORDER — RIVAROXABAN 10 MG PO TABS: 10.0000 mg | ORAL_TABLET | Freq: Every day | ORAL | Status: DC

## 2015-07-25 MED ORDER — HYDROMORPHONE HCL 2 MG PO TABS: 2.0000 mg | ORAL_TABLET | ORAL | Status: DC | PRN

## 2015-07-25 MED ORDER — METHOCARBAMOL 500 MG PO TABS: 500.0000 mg | ORAL_TABLET | Freq: Three times a day (TID) | ORAL | Status: DC | PRN

## 2015-07-25 NOTE — Progress Notes (Signed)
Physical Therapy Treatment Patient Details Name: Larry [name] MRN: [id number] DOB: 1951-01-03 Today's Date: 07/25/2015    History of Present Illness 64 y.o. male admitted to [facility] on 07/23/15 for elective R TKA.  Pt with significant PMHx of HA, asthma, DDD of cervical and lumbar spine, urinary frequency, multiple cervical and lumbar surgeries, L knee and left shoulder arthroscopy.      PT Comments    Pt. Was able to complete stair training and reviewed all sitting exercises.  He is doing well with his WB status and will ascend and descend stairs sideways at this time.  Plan D/C this afternoon.  Follow Up Recommendations  Supervision for mobility/OOB;Other (comment) (wife will perform HHPT)     Equipment Recommendations  None recommended by PT       Precautions / Restrictions Precautions Precautions: Knee Precaution Booklet Issued: Yes (comment) Precaution Comments: exercise handout reviewed Restrictions Weight Bearing Restrictions: Yes RLE Weight Bearing: Partial weight bearing RLE Partial Weight Bearing Percentage or Pounds: 50    Mobility  Bed Mobility Overal bed mobility: Modified Independent Bed Mobility: Supine to Sit     Supine to sit: Min assist     General bed mobility comments: Pt. required therapists help to lower leg off of bed  Transfers Overall transfer level: Modified independent Equipment used: Rolling walker (2 wheeled) Transfers: Sit to/from Stand Sit to Stand: Modified independent (Device/Increase time)         General transfer comment: modified independent as patient had to slowly sit and stand and needed either hands to push up or arm rests to let himself down; verbal cues to keep right leg out when sitting to minimize R knee pain  Ambulation/Gait Ambulation/Gait assistance: Supervision Ambulation Distance (Feet): 100 Feet Assistive device: Rolling walker (2 wheeled) Gait Pattern/deviations: Step-to pattern;Decreased stance time -  right;Antalgic     General Gait Details: supervision for safety; verbal cues for posture and maintaining WB status   Stairs Stairs: Yes Stairs assistance: Min guard Stair Management: One rail Right;One rail Left;Step to pattern;Backwards;Forwards (rail on R ascending; rail on L descending) Number of Stairs: 6 General stair comments: Pt. ascended and descended sideways with both hands on rail; min guard for safety and control       Balance Overall balance assessment: Needs assistance Sitting-balance support: Bilateral upper extremity supported;Feet supported Sitting balance-Leahy Scale: Good     Standing balance support: Bilateral upper extremity supported;During functional activity Standing balance-Leahy Scale: Fair Standing balance comment: RW for support                    Cognition Arousal/Alertness: Awake/alert Behavior During Therapy: WFL for tasks assessed/performed Overall Cognitive Status: Within Functional Limits for tasks assessed                      Exercises Total Joint Exercises Heel Slides: AROM;Right;10 reps;Seated Long Arc Quad: Strengthening;Right;10 reps;Seated;Other (comment) (5 second hold) Knee Flexion: AROM;Left;10 reps;AAROM;Seated    General Comments General comments (skin integrity, edema, etc.): Pt. was very limited by pain and performed all activities and exercises very slowly and was always guarding his R knee.        Pertinent Vitals/Pain Pain Assessment: 0-10 Pain Score: 6  Pain Location: right knee Pain Descriptors / Indicators: Aching;Constant;Grimacing Pain Intervention(s): Limited activity within patient's tolerance;Monitored during session;Premedicated before session;Repositioned;RN gave pain meds during session;Ice applied           PT Goals (current goals can now be found  in the care plan section) Acute Rehab PT Goals Patient Stated Goal: to go home Progress towards PT goals: Progressing toward goals     Frequency  Min 6X/week    PT Plan Current plan remains appropriate       End of Session Equipment Utilized During Treatment: Gait belt Activity Tolerance: Patient tolerated treatment well;Patient limited by pain Patient left: in chair;with call bell/phone within reach     Time: 1215-1300 PT Time Calculation (min) (ACUTE ONLY): 45 min  Charges: 2 Gait, [place], [place] - Office   07/25/2015, 1:49 PM

## 2015-07-25 NOTE — Progress Notes (Signed)
Patient ID: Larry [name], male   DOB: 12/15/1950, 64 y.o.   MRN: [id number] PATIENT ID: Larry [name]        MRN:  [id number]          DOB/AGE: 1950/10/02 / 64 y.o.    Joni Fears, MD   Biagio Borg, PA-C [address] [place], [place]  [address]                             [phone]   PROGRESS NOTE  Subjective:  negative for Chest Pain  negative for Shortness of Breath  negative for Nausea/Vomiting   negative for Calf Pain    Tolerating Diet: yes         Patient reports pain as moderate.     Better night-dilaudid best analgesic  Objective: Vital signs in last 24 hours:   Patient Vitals for the past 24 hrs:  BP Temp Temp src Pulse Resp SpO2  07/25/15 0548 (!) 141/67 mmHg 97.9 F (36.6 C) Oral 97 17 93 %  07/24/15 2029 (!) 157/80 mmHg 98.8 F (37.1 C) - 95 - 94 %  07/24/15 1628 (!) 154/67 mmHg - - 90 - 97 %  07/24/15 1300 134/64 mmHg 98.9 F (37.2 C) - 97 16 97 %  07/24/15 0949 108/62 mmHg - - 69 - 97 %      Intake/Output from previous day:   10/26 0701 - 10/27 0700 In: 480 [P.O.:480] Out: 475 [Urine:125; Drains:350]   Intake/Output this shift:       Intake/Output      10/26 0701 - 10/27 0700 10/27 0701 - 10/28 0700   P.O. 480    I.V. (mL/kg)     IV Piggyback     Total Intake(mL/kg) 480 (5.6)    Urine (mL/kg/hr) 125 (0.1)    Drains 350 (0.2)    Blood     Total Output 475     Net +5             LABORATORY DATA:  Recent Labs  07/24/15 0320 07/25/15 0500  WBC 9.0 8.0  HGB 14.4 12.6*  HCT 42.8 37.4*  PLT 132* 127*    Recent Labs  07/24/15 0320 07/25/15 0500  NA 136 131*  K 4.1 3.6  CL 97* 95*  CO2 29 30  BUN 7 7  CREATININE 0.84 0.75  GLUCOSE 123* 138*  CALCIUM 8.2* 7.8*   Lab Results  Component Value Date   INR 1.15 07/10/2015    Recent Radiographic Studies :  Dg Chest 2 View  07/10/2015  CLINICAL DATA:  Preoperative examination prior to right total knee joint replacement, history of smoking in the past. EXAM:  CHEST  2 VIEW COMPARISON:  PA and lateral chest x-ray dated November 26, 2010 FINDINGS: The lungs are adequately inflated. The interstitial markings are mildly prominent though stable. The heart is top-normal in size. The pulmonary vascularity is normal. The mediastinum is normal in width. There is tortuosity of the ascending and descending thoracic aorta. There is no pleural effusion. There is mild multilevel degenerative disc disease of the thoracic spine. IMPRESSION: There is no active cardiopulmonary disease. Mild interstitial prominence likely reflects the patient's smoking history. Electronically Signed   By: David  Martinique M.D.   On: 07/10/2015 16:05     Examination:  General appearance: alert, cooperative and no distress  Wound Exam: clean, dry, intact   Drainage:  Scant/small amount Serosanguinous exudate in  hemovac  Motor Exam: EHL, FHL, Anterior Tibial and Posterior Tibial Intact  Sensory Exam:  normal  Vascular Exam: Normal  Assessment:    2 Days Post-Op  Procedure(s) (LRB): TOTAL KNEE ARTHROPLASTY (Right)  ADDITIONAL DIAGNOSIS:  Principal Problem:   Primary osteoarthritis of right knee Active Problems:   Primary osteoarthritis of knee  Acute Blood Loss Anemia-asymptomatic Hyponatremia-asymptomatic  Plan: Physical Therapy as ordered Partial Weight Bearing @ 50% (PWB)  DVT Prophylaxis:  Xarelto, Foot Pumps and TED hose  DISCHARGE PLAN: Home  DISCHARGE NEEDS: HHPT, CPM, Walker and 3-in-1 comode seat   hemovac removed and dressing changed, voiding without difficulty, moderate pain controlled with dilaudid- will plan on D/C today     Jeromy Borcherding W  07/25/2015 9:27 AM

## 2015-07-25 NOTE — Discharge Summary (Signed)
Larry Fears, MD   Biagio Borg, PA-C [address], [place], [place]  [address]                             [phone]  PATIENT ID: Larry [name]        MRN:  [id number]          DOB/AGE: 12/28/1950 / 64 y.o.    DISCHARGE SUMMARY  ADMISSION DATE:    07/23/2015 DISCHARGE DATE:   07/25/2015   ADMISSION DIAGNOSIS: RIGHT KNEE OSTEOARTHRITIS    DISCHARGE DIAGNOSIS:  RIGHT KNEE OSTEOARTHRITIS    ADDITIONAL DIAGNOSIS: Principal Problem:   Primary osteoarthritis of right knee Active Problems:   Primary osteoarthritis of knee   Hypothyroidism  Past Medical History  Diagnosis Date  . Difficult intubation     2012   CERVICAL FUSION   . Hypothyroidism   . Headache(784.0)     VERAPAMIL FOR PREVENTION   . Asthma     "as a child" (04/12/2013)  . 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 DDD (degenerative disc disease), cervical   . DDD (degenerative disc disease), lumbar   . Arthritis     "knees; left shoulder" (04/12/2013)  . OSA (obstructive sleep apnea)     "haven't wore mask in years; had OR; still snore" (04/12/2013)  . History of chicken pox     as an adult  . Urinary frequency   . 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 He has had problems with the right knee for some time. 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 He has had problems with both of his knees, but most recently the right knee over the past 3-1/2 months has become extremely uncomfortable. He is having popping and clicking and difficulty with walking. He complains of some effusions. He has used some Relafen, which is somewhat beneficial. He has  even used narcotics to help his pain. These were from a previous back surgery, which he is not having any problems. He has tried using a brace, but now is to the point where he is having pain with every step as well as pain with activities of daily living. He has difficulty with sleeping at nighttime secondary to this pain. He has been tried with conservative treatment, but so far nothing is really helping him. It is certainly limiting his ability to be gainfully employed also  HOSPITAL COURSE:  Larry [name] is a 64 y.o. admitted on 07/23/2015 and found to have a diagnosis of [place].  After appropriate laboratory studies were obtained  they were taken to the operating room on 07/23/2015 and underwent  Procedure(s): TOTAL KNEE ARTHROPLASTY  Right.   They were given perioperative antibiotics:  Anti-infectives    Start     Dose/Rate Route Frequency Ordered Stop   07/23/15 1700  ceFAZolin (ANCEF) IVPB 2 g/50 mL premix     2 g 100 mL/hr over 30 Minutes Intravenous Every 6 hours 07/23/15 1622 07/23/15 2346   07/23/15 0900  ceFAZolin (ANCEF) IVPB 2 g/50 mL premix  2 g 100 mL/hr over 30 Minutes Intravenous To [facility]Stay [facility] 07/22/15 1332 07/23/15 1105    .  Tolerated the procedure well.  Toradol was given post op.  POD #1, allowed out of bed to a chair.  PT for ambulation and exercise program.  Foley D/C'd in morning.  IV saline locked.  O2 discontionued.  POD #2, continued PT and ambulation.   Hemovac pulled. .  The remainder of the hospital course was dedicated to ambulation and strengthening.   The patient was discharged on 2 Days Post-Op in  Stable condition.  Blood products given:none  DIAGNOSTIC STUDIES: Recent vital signs: Patient Vitals for the past 24 hrs:  BP Temp Temp src Pulse Resp SpO2  07/25/15 0548 (!) 141/67 mmHg 97.9 F (36.6 C) Oral 97 17 93 %  07/24/15 2029 (!) 157/80 mmHg 98.8 F (37.1 C) - 95 - 94 %  07/24/15 1628 (!) 154/67 mmHg - -  90 - 97 %  07/24/15 1300 134/64 mmHg 98.9 F (37.2 C) - 97 16 97 %  07/24/15 0949 108/62 mmHg - - 69 - 97 %       Recent laboratory studies:  Recent Labs  07/24/15 0320 07/25/15 0500  WBC 9.0 8.0  HGB 14.4 12.6*  HCT 42.8 37.4*  PLT 132* 127*    Recent Labs  07/24/15 0320 07/25/15 0500  NA 136 131*  K 4.1 3.6  CL 97* 95*  CO2 29 30  BUN 7 7  CREATININE 0.84 0.75  GLUCOSE 123* 138*  CALCIUM 8.2* 7.8*   Lab Results  Component Value Date   INR 1.15 07/10/2015     Recent Radiographic Studies :  Dg Chest 2 View  07/10/2015  CLINICAL DATA:  Preoperative examination prior to right total knee joint replacement, history of smoking in the past. EXAM: CHEST  2 VIEW COMPARISON:  PA and lateral chest x-ray dated November 26, 2010 FINDINGS: The lungs are adequately inflated. The interstitial markings are mildly prominent though stable. The heart is top-normal in size. The pulmonary vascularity is normal. The mediastinum is normal in width. There is tortuosity of the ascending and descending thoracic aorta. There is no pleural effusion. There is mild multilevel degenerative disc disease of the thoracic spine. IMPRESSION: There is no active cardiopulmonary disease. Mild interstitial prominence likely reflects the patient's smoking history. Electronically Signed   By: David  Martinique M.D.   On: 07/10/2015 16:05    DISCHARGE INSTRUCTIONS: Discharge Instructions    CPM    Complete by:  As directed   Continuous passive motion machine (CPM):      Use the CPM from 0 to 60 for 6-8 hours per day.      You may increase by 5-10 degrees per day.  You may break it up into 2 or 3 sessions per day.      Use CPM for 3-4  weeks or until you are told to stop.     Call MD / Call 911    Complete by:  As directed   If you experience chest pain or shortness of breath, CALL 911 and be transported to the hospital emergency room.  If you develope a fever above 101 F, pus (white drainage) or increased  drainage or redness at the wound, or calf pain, call your surgeon's office.     Change dressing    Complete by:  As directed   DO NOT CHANGE YOUR DRESSING     Constipation Prevention    Complete  by:  As directed   Drink plenty of fluids.  Prune juice may be helpful.  You may use a stool softener, such as Colace (over the counter) 100 mg twice a day.  Use MiraLax (over the counter) for constipation as needed.     Diet general    Complete by:  As directed      Discharge instructions    Complete by:  As directed   Hart items at home which could result in a fall. This includes throw rugs or furniture in walking pathways ICE to the affected joint every three hours while awake for 30 minutes at a time, for at least the first 3-5 days, and then as needed for pain and swelling.  Continue to use ice for pain and swelling. You may notice swelling that will progress down to the foot and ankle.  This is normal after surgery.  Elevate your leg when you are not up walking on it.   Continue to use the breathing machine you got in the hospital (incentive spirometer) which will help keep your temperature down.  It is common for your temperature to cycle up and down following surgery, especially at night when you are not up moving around and exerting yourself.  The breathing machine keeps your lungs expanded and your temperature down.   DIET:  As you were doing prior to hospitalization, we recommend a well-balanced diet.  DRESSING / WOUND CARE / SHOWERING  Keep the surgical dressing until follow up.  The dressing is water proof, so you can shower without any extra covering.  IF THE DRESSING FALLS OFF or the wound gets wet inside, change the dressing with sterile gauze.  Please use good hand washing techniques before changing the dressing.  Do not use any lotions or creams on the incision until instructed by your surgeon.    ACTIVITY  Increase activity slowly as  tolerated, but follow the weight bearing instructions below.   No driving for 6 weeks or until further direction given by your physician.  You cannot drive while taking narcotics.  No lifting or carrying greater than 10 lbs. until further directed by your surgeon. Avoid periods of inactivity such as sitting longer than an hour when not asleep. This helps prevent blood clots.  You may return to work once you are authorized by your doctor.     WEIGHT BEARING   Partial weight bearing with assist device as directed.  50% weight bearing as taught in PT   EXERCISES  Results after joint replacement surgery are often greatly improved when you follow the exercise, range of motion and muscle strengthening exercises prescribed by your doctor. Safety measures are also important to protect the joint from further injury. Any time any of these exercises cause you to have increased pain or swelling, decrease what you are doing until you are comfortable again and then slowly increase them. If you have problems or questions, call your caregiver or physical therapist for advice.   Rehabilitation is important following a joint replacement. After just a few days of immobilization, the muscles of the leg can become weakened and shrink (atrophy).  These exercises are designed to build up the tone and strength of the thigh and leg muscles and to improve motion. Often times heat used for twenty to thirty minutes before working out will loosen up your tissues and help with improving the range of motion but do not use heat for the first two  weeks following surgery (sometimes heat can increase post-operative swelling).   These exercises can be done on a training (exercise) mat, on the floor, on a table or on a bed. Use whatever works the best and is most comfortable for you.    Use music or television while you are exercising so that the exercises are a pleasant break in your day. This will make your life better with the  exercises acting as a break in your routine that you can look forward to.   Perform all exercises about fifteen times, three times per day or as directed.  You should exercise both the operative leg and the other leg as well.   Exercises include:   Quad Sets - Tighten up the muscle on the front of the thigh (Quad) and hold for 5-10 seconds.   Straight Leg Raises - With your knee straight (if you were given a brace, keep it on), lift the leg to 60 degrees, hold for 3 seconds, and slowly lower the leg.  Perform this exercise against resistance later as your leg gets stronger.  Leg Slides: Lying on your back, slowly slide your foot toward your buttocks, bending your knee up off the floor (only go as far as is comfortable). Then slowly slide your foot back down until your leg is flat on the floor again.  Angel Wings: Lying on your back spread your legs to the side as far apart as you can without causing discomfort.  Hamstring Strength:  Lying on your back, push your heel against the floor with your leg straight by tightening up the muscles of your buttocks.  Repeat, but this time bend your knee to a comfortable angle, and push your heel against the floor.  You may put a pillow under the heel to make it more comfortable if necessary.   A rehabilitation program following joint replacement surgery can speed recovery and prevent re-injury in the future due to weakened muscles. Contact your doctor or a physical therapist for more information on knee rehabilitation.    CONSTIPATION  Constipation is defined medically as fewer than three stools per week and severe constipation as less than one stool per week.  Even if you have a regular bowel pattern at home, your normal regimen is likely to be disrupted due to multiple reasons following surgery.  Combination of anesthesia, postoperative narcotics, change in appetite and fluid intake all can affect your bowels.   YOU MUST use at least one of the following  options; they are listed in order of increasing strength to get the job done.  They are all available over the counter, and you may need to use some, POSSIBLY even all of these options:    Drink plenty of fluids (prune juice may be helpful) and high fiber foods Colace 100 mg by mouth twice a day  Senokot for constipation as directed and as needed Dulcolax (bisacodyl), take with full glass of water  Miralax (polyethylene glycol) once or twice a day as needed.  If you have tried all these things and are unable to have a bowel movement in the first 3-4 days after surgery call either your surgeon or your primary doctor.    If you experience loose stools or diarrhea, hold the medications until you stool forms back up.  If your symptoms do not get better within 1 week or if they get worse, check with your doctor.  If you experience "the worst abdominal pain ever" or develop nausea or vomiting,  please contact the office immediately for further recommendations for treatment.   ITCHING:  If you experience itching with your medications, try taking only a single pain pill, or even half a pain pill at a time.  You can also use Benadryl over the counter for itching or also to help with sleep.   TED HOSE STOCKINGS:  Use stockings on both legs until for at least 2 weeks or as directed by physician office. They may be removed at night for sleeping.  MEDICATIONS:  See your medication summary on the "After Visit Summary" that nursing will review with you.  You may have some home medications which will be placed on hold until you complete the course of blood thinner medication.  It is important for you to complete the blood thinner medication as prescribed.  PRECAUTIONS:  If you experience chest pain or shortness of breath - call 911 immediately for transfer to the hospital emergency department.   If you develop a fever greater that 101 F, purulent drainage from wound, increased redness or drainage from wound, foul  odor from the wound/dressing, or calf pain - CONTACT YOUR SURGEON.                                                   FOLLOW-UP APPOINTMENTS:  If you do not already have a post-op appointment, please call the office for an appointment to be seen by your surgeon.  Guidelines for how soon to be seen are listed in your "After Visit Summary", but are typically between 1-4 weeks after surgery.  OTHER INSTRUCTIONS:   Knee Replacement:  Do not place pillow under knee, focus on keeping the knee straight while resting. CPM instructions: 0-90 degrees, 2 hours in the morning, 2 hours in the afternoon, and 2 hours in the evening. Place foam block, curve side up under heel at all times except when in CPM or when walking.  DO NOT modify, tear, cut, or change the foam block in any way.  MAKE SURE YOU:  Understand these instructions.  Get help right away if you are not doing well or get worse.    Thank you for letting us be a part of your medical care team.  It is a privilege we respect greatly.  We hope these instructions will help you stay on track for a fast and full recovery!     Do not put a pillow under the knee. Place it under the heel.    Complete by:  As directed      Driving restrictions    Complete by:  As directed   No driving for 6 weeks     Increase activity slowly as tolerated    Complete by:  As directed      Lifting restrictions    Complete by:  As directed   No lifting for 6 weeks     Partial weight bearing    Complete by:  As directed   % Body Weight:  50%  Laterality:  right  Extremity:  Lower     Patient may shower    Complete by:  As directed   You may shower over the brown dressing     TED hose    Complete by:  As directed   Use stockings (TED hose) for 2-3 weeks on right leg.  You may  remove them at night for sleeping.           DISCHARGE MEDICATIONS:     Medication List    STOP taking these medications        aspirin EC 81 MG tablet      HYDROcodone-acetaminophen 5-325 MG tablet  Commonly known as:  NORCO/VICODIN     meloxicam 15 MG tablet  Commonly known as:  MOBIC      TAKE these medications        furosemide 40 MG tablet  Commonly known as:  LASIX  Take 40 mg by mouth daily.     HYDROmorphone 2 MG tablet  Commonly known as:  DILAUDID  Take 1-2 tablets (2-4 mg total) by mouth every 4 (four) hours as needed for severe pain (1 - 2 TABLETS Q 4H PRN PAIN).     levothyroxine 150 MCG tablet  Commonly known as:  SYNTHROID, LEVOTHROID  Take 150 mcg by mouth daily before breakfast.     LORazepam 1 MG tablet  Commonly known as:  ATIVAN  Take 0.5 mg by mouth 4 (four) times daily.     methocarbamol 500 MG tablet  Commonly known as:  ROBAXIN  Take 1 tablet (500 mg total) by mouth every 8 (eight) hours as needed for muscle spasms.     MULTIVITAMIN PO  Take 1 tablet by mouth daily.     PREVAGEN PO  Take 1 tablet by mouth daily.     rivaroxaban 10 MG Tabs tablet  Commonly known as:  XARELTO  Take 1 tablet (10 mg total) by mouth daily with breakfast.     tamsulosin 0.4 MG Caps capsule  Commonly known as:  FLOMAX  Take 0.4 mg by mouth daily.     testosterone cypionate 100 MG/ML injection  Commonly known as:  DEPOTESTOTERONE CYPIONATE  Inject 100 mg into the muscle every 14 (fourteen) days. For IM use only     verapamil 180 MG 24 hr capsule  Commonly known as:  VERELAN PM  Take 180 mg by mouth at bedtime. Uses for headaches        FOLLOW UP VISIT:       Follow-up Information    Follow up with [facility], Vonna Kotyk, MD. Schedule an appointment as soon as possible for a visit on 08/05/2015.   Specialty:  Orthopedic Surgery   Contact information:   [address] [place] [address] [phone]       DISPOSITION:   Home  CONDITION:  Stable   Mike Craze. [place], [place] [phone]  07/25/2015 9:47 AM

## 2016-08-28 ENCOUNTER — Telehealth (INDEPENDENT_AMBULATORY_CARE_PROVIDER_SITE_OTHER): Payer: Self-pay

## 2016-08-28 ENCOUNTER — Telehealth (INDEPENDENT_AMBULATORY_CARE_PROVIDER_SITE_OTHER): Payer: Self-pay | Admitting: Orthopaedic Surgery

## 2016-08-28 ENCOUNTER — Encounter (INDEPENDENT_AMBULATORY_CARE_PROVIDER_SITE_OTHER): Payer: Self-pay

## 2016-08-28 ENCOUNTER — Other Ambulatory Visit (INDEPENDENT_AMBULATORY_CARE_PROVIDER_SITE_OTHER): Payer: Self-pay

## 2016-08-28 DIAGNOSIS — Z89511 Acquired absence of right leg below knee: Secondary | ICD-10-CM

## 2016-08-28 DIAGNOSIS — K031 Abrasion of teeth: Secondary | ICD-10-CM

## 2016-08-28 MED ORDER — CEPHALEXIN 500 MG PO CAPS: ORAL_CAPSULE | ORAL | 0 refills | Status: DC

## 2016-08-28 NOTE — Telephone Encounter (Signed)
Dentist office calling wanting to question pre-meds. Please call back ASAP

## 2016-08-28 NOTE — Telephone Encounter (Signed)
Sent into Pharmacy provided

## 2016-08-28 NOTE — Telephone Encounter (Signed)
Patient states he had a knee replacement a year ago and would like to know how long to keep taking antibiotics before he goes to the dentist? Please send a prescription to [facility] on [address]

## 2016-08-28 NOTE — Telephone Encounter (Signed)
Sent in rx. Pts call did not indicate he was having dental work done today.

## 2017-02-12 ENCOUNTER — Other Ambulatory Visit: Payer: Self-pay | Admitting: Neurological Surgery

## 2017-02-12 DIAGNOSIS — M5416 Radiculopathy, lumbar region: Secondary | ICD-10-CM

## 2017-02-20 ENCOUNTER — Ambulatory Visit
Admission: RE | Admit: 2017-02-20 | Discharge: 2017-02-20 | Disposition: A | Discharge status: Home / Self Care | Payer: BLUE CROSS/BLUE SHIELD | Source: Ambulatory Visit | Attending: Neurological Surgery | Admitting: Neurological Surgery

## 2017-02-20 DIAGNOSIS — M5416 Radiculopathy, lumbar region: Secondary | ICD-10-CM

## 2017-03-04 ENCOUNTER — Other Ambulatory Visit: Payer: Self-pay | Admitting: Neurological Surgery

## 2017-03-18 NOTE — Pre-Procedure Instructions (Addendum)
Larry [name]  03/18/2017      [place], [place] - [address] [place] [address] Phone: [phone] Fax: [phone]  [facility] [place], [place] - [address] AT [place] [address] Phone: [phone] Fax: [phone]  [facility], [place] [address] Phone: [phone] Fax: [phone]  [id number] - [place], [place] - [place] [place] [address] Phone: [phone] Fax: [phone]    Your procedure is scheduled on Tues. June 26  Report to [facility] Admitting at 11:00 A.M.  Call this number if you have problems the morning of surgery:  [phone]   Remember:  Do not eat food or drink liquids after midnight .   Take these medicines the morning of surgery with A SIP OF WATER : hydrocodone if needed, levothyroxine(synthroid), afrin nasal spray if needed, tamsulosin (flomax), verapamil (calan-SR)            Stop advil, motrin, ibuprofen, mobic, aleve, BC Powders, Goody's, vitamins and herbal medicines.            Stop aspirin per Dr. Ellene Route   Do not wear jewelry.  Do not wear lotions, powders, or perfumes, or deoderant.  Do not shave 48 hours prior to surgery.  Men may shave face and neck.  Do not bring valuables to the hospital.  [facility] is not responsible for any belongings or valuables.  Contacts, dentures or bridgework may not be worn into surgery.  Leave your suitcase in the car.  After surgery it may be brought to your room.  For patients admitted to the hospital, discharge time will be determined by your treatment team.  Patients discharged the day of surgery will not be allowed to drive home.    Special instructions:    [facility]- Preparing For Surgery  Before surgery, you can play an important role. Because skin is not sterile, your skin needs to be as free of germs as possible. You can reduce the number of germs on your skin by washing with CHG (chlorahexidine gluconate) Soap before surgery.  CHG is an antiseptic cleaner which kills germs and bonds with the skin to continue killing germs even after washing.  Please do not use if you have an allergy to CHG or antibacterial soaps. If your skin becomes reddened/irritated stop using the CHG.  Do not shave (including legs and underarms) for at least 48 hours prior to first CHG shower. It is OK to shave your face.  Please follow these instructions carefully.   1. Shower the NIGHT BEFORE SURGERY and the MORNING OF SURGERY with CHG.   2. If you chose to wash your hair, wash your hair first as usual with your normal shampoo.  3. After you shampoo, rinse your hair and body thoroughly to remove the shampoo.  4. Use CHG as you would any other liquid soap. You can apply CHG directly to the skin and wash gently with a scrungie or a clean washcloth.   5. Apply the CHG Soap to your body ONLY FROM THE NECK DOWN.  Do not use on open wounds or open sores. Avoid contact with your eyes, ears, mouth and genitals (private parts). Wash genitals (private parts) with your  normal soap.  6. Wash thoroughly, paying special attention to the area where your surgery will be performed.  7. Thoroughly rinse your body with warm water from the neck down.  8. DO NOT shower/wash with your normal soap after using and rinsing off the CHG Soap.  9. Pat yourself dry with a CLEAN TOWEL.   10. Wear CLEAN PAJAMAS   11. Place CLEAN SHEETS on your bed the night of your first shower and DO NOT SLEEP WITH PETS.    Day of Surgery: Do not apply any deodorants/lotions. Please wear clean clothes to the hospital/surgery center.      Please read over the following fact sheets that you were  given. Coughing and Deep Breathing, MRSA Information and Surgical Site Infection Prevention

## 2017-03-19 ENCOUNTER — Encounter (HOSPITAL_COMMUNITY)
Admission: RE | Admit: 2017-03-19 | Discharge: 2017-03-19 | Disposition: A | Discharge status: Home / Self Care | Payer: BLUE CROSS/BLUE SHIELD | Source: Ambulatory Visit | Attending: Neurological Surgery | Admitting: Neurological Surgery

## 2017-03-19 ENCOUNTER — Encounter (HOSPITAL_COMMUNITY): Payer: Self-pay

## 2017-03-19 DIAGNOSIS — Z01818 Encounter for other preprocedural examination: Secondary | ICD-10-CM | POA: Diagnosis present

## 2017-03-19 DIAGNOSIS — R001 Bradycardia, unspecified: Secondary | ICD-10-CM | POA: Insufficient documentation

## 2017-03-19 DIAGNOSIS — Z01810 Encounter for preprocedural cardiovascular examination: Secondary | ICD-10-CM | POA: Insufficient documentation

## 2017-03-19 DIAGNOSIS — M5416 Radiculopathy, lumbar region: Secondary | ICD-10-CM | POA: Insufficient documentation

## 2017-03-19 HISTORY — DX: Hypoglycemia, unspecified: E16.2

## 2017-03-19 LAB — TYPE AND SCREEN
ABO/RH(D): A POS
Antibody Screen: NEGATIVE

## 2017-03-19 LAB — CBC
HCT: 49.2 % (ref 39.0–52.0)
Hemoglobin: 17.1 g/dL — ABNORMAL HIGH (ref 13.0–17.0)
MCH: 32.5 pg (ref 26.0–34.0)
MCHC: 34.8 g/dL (ref 30.0–36.0)
MCV: 93.5 fL (ref 78.0–100.0)
Platelets: 139 10*3/uL — ABNORMAL LOW (ref 150–400)
RBC: 5.26 MIL/uL (ref 4.22–5.81)
RDW: 13.2 % (ref 11.5–15.5)
WBC: 6.2 10*3/uL (ref 4.0–10.5)

## 2017-03-19 LAB — SURGICAL PCR SCREEN
MRSA, PCR: NEGATIVE
Staphylococcus aureus: NEGATIVE

## 2017-03-19 LAB — BASIC METABOLIC PANEL
Anion gap: 4 — ABNORMAL LOW (ref 5–15)
BUN: 19 mg/dL (ref 6–20)
CO2: 31 mmol/L (ref 22–32)
Calcium: 9.2 mg/dL (ref 8.9–10.3)
Chloride: 106 mmol/L (ref 101–111)
Creatinine, Ser: 0.88 mg/dL (ref 0.61–1.24)
GFR calc Af Amer: >60 mL/min (ref >60)
GFR calc non Af Amer: >60 mL/min (ref >60)
Glucose, Bld: 109 mg/dL — ABNORMAL HIGH (ref 65–99)
Potassium: 4.5 mmol/L (ref 3.5–5.1)
Sodium: 141 mmol/L (ref 135–145)

## 2017-03-19 NOTE — Progress Notes (Signed)
Anesthesia Chart Review: Patient is a 66 year old male scheduled for L1-2, L2-3 PLIF on 03/23/17 by Dr. Ellene Route.  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   Patient reports he was told he was a DIFFICULT AIRWAY with his 2007 lumbar fusion.  He has had several surgeries since, and denies history of an awake intubation.  - 04/05/06 [facility]): He was noted to be an unexpected difficult airway due to anterior larynx and glidescope was utilized.   - 07/08/2007 [facility]): Glidescope with Eschmann stylet were used.  - 2012 [facility]): Bag mask ventilation was difficult. His airway was ultimately secured asleep, using a Miller 3 blade (Bougie passed then 7.5 ETT). A LMA was used in his two subsequent surgeries.  - 06/18/11 ([place]): Meredith Staggers was utilized. - 04/11/13 [facility]): 04/11/13 [facility]), intubated after 2 attempts: Grade III. Mask ventilation without difficulty. DL x 1 by CRNA with Sabra Heck 2. Unable to pick up epiglottis. No attempt at placement of ETT. DL x 1 by MDA with MAC 3. Able to visualize VC. Blue stylet used to place 7.5 mm ETT. Easy placement of ETT.  - 07/23/15 [facility]): Failed SAB. LMA and adductor canal block used.  PCP is Dr. Gareth Eagle.   Meds include Prevagen, ASA 81 mg, Lasix, Norco, Xalatan ophthalmic solution, levothyroxine, Ativan, Afrin nasal spray, Flomax, testosterone injections (Q 14 days), verapamil.  BP (!) 146/77   Pulse (!) 59   Temp 36.6 C   Resp 20   Ht 5' 6"  (1.676 m)   Wt 183 lb 1.6 oz (83.1 kg)   SpO2 96%   BMI 29.55 kg/m   EKG 03/19/17: SB at 57 bpm.   Preoperative labs noted. Cr 0.88. Glucose  109. H/H 17.1/49.2. T&S done.   If no acute changes then I anticipate that he can proceed as planned. He will require GETA anesthesia for this procedure. Previous anesthesia airway records outlined above.  George Hugh [facility]Anesthesiology Phone [phone] 03/19/2017 4:10 PM

## 2017-03-19 NOTE — Progress Notes (Signed)
PCP:Dr. Gareth Eagle  Last aspirin 03/18/17.

## 2017-03-23 ENCOUNTER — Inpatient Hospital Stay (HOSPITAL_COMMUNITY)
Admission: RE | Disposition: A | Discharge status: Home / Self Care | Payer: Self-pay | Source: Ambulatory Visit | Attending: Neurological Surgery

## 2017-03-23 ENCOUNTER — Encounter (HOSPITAL_COMMUNITY): Payer: Self-pay | Admitting: *Deleted

## 2017-03-23 ENCOUNTER — Inpatient Hospital Stay (HOSPITAL_COMMUNITY): Payer: BLUE CROSS/BLUE SHIELD

## 2017-03-23 ENCOUNTER — Inpatient Hospital Stay (HOSPITAL_COMMUNITY): Payer: BLUE CROSS/BLUE SHIELD | Admitting: Vascular Surgery

## 2017-03-23 ENCOUNTER — Inpatient Hospital Stay (HOSPITAL_COMMUNITY): Payer: BLUE CROSS/BLUE SHIELD | Admitting: Certified Registered"

## 2017-03-23 ENCOUNTER — Inpatient Hospital Stay (HOSPITAL_COMMUNITY)
Admission: RE | Admit: 2017-03-23 | Discharge: 2017-03-25 | DRG: 454 | Disposition: A | Discharge status: Home / Self Care | Payer: BLUE CROSS/BLUE SHIELD | Source: Ambulatory Visit | Attending: Neurological Surgery | Admitting: Neurological Surgery

## 2017-03-23 DIAGNOSIS — Y839 Surgical procedure, unspecified as the cause of abnormal reaction of the patient, or of later complication, without mention of misadventure at the time of the procedure: Secondary | ICD-10-CM | POA: Diagnosis present

## 2017-03-23 DIAGNOSIS — Z791 Long term (current) use of non-steroidal anti-inflammatories (NSAID): Secondary | ICD-10-CM

## 2017-03-23 DIAGNOSIS — Z79899 Other long term (current) drug therapy: Secondary | ICD-10-CM

## 2017-03-23 DIAGNOSIS — M48062 Spinal stenosis, lumbar region with neurogenic claudication: Principal | ICD-10-CM | POA: Diagnosis present

## 2017-03-23 DIAGNOSIS — Z7982 Long term (current) use of aspirin: Secondary | ICD-10-CM | POA: Diagnosis not present

## 2017-03-23 DIAGNOSIS — Z981 Arthrodesis status: Secondary | ICD-10-CM

## 2017-03-23 DIAGNOSIS — M4726 Other spondylosis with radiculopathy, lumbar region: Secondary | ICD-10-CM | POA: Diagnosis present

## 2017-03-23 DIAGNOSIS — Z87891 Personal history of nicotine dependence: Secondary | ICD-10-CM | POA: Diagnosis not present

## 2017-03-23 DIAGNOSIS — G9741 Accidental puncture or laceration of dura during a procedure: Secondary | ICD-10-CM | POA: Diagnosis not present

## 2017-03-23 DIAGNOSIS — Y92234 Operating room of hospital as the place of occurrence of the external cause: Secondary | ICD-10-CM | POA: Diagnosis present

## 2017-03-23 DIAGNOSIS — Z419 Encounter for procedure for purposes other than remedying health state, unspecified: Secondary | ICD-10-CM

## 2017-03-23 DIAGNOSIS — E039 Hypothyroidism, unspecified: Secondary | ICD-10-CM | POA: Diagnosis present

## 2017-03-23 SURGERY — POSTERIOR LUMBAR FUSION 2 LEVEL
Anesthesia: General | Site: Back

## 2017-03-23 MED ORDER — OXYMETAZOLINE HCL 0.05 % NA SOLN: 1.0000 | Freq: Two times a day (BID) | NASAL | Status: DC | PRN

## 2017-03-23 MED ORDER — PROPOFOL 10 MG/ML IV BOLUS
INTRAVENOUS | Status: AC
  Filled 2017-03-23: qty 20

## 2017-03-23 MED ORDER — LACTATED RINGERS IV SOLN
INTRAVENOUS | Status: DC | PRN
  Administered 2017-03-23 (×2): via INTRAVENOUS

## 2017-03-23 MED ORDER — DEXAMETHASONE SODIUM PHOSPHATE 10 MG/ML IJ SOLN
INTRAMUSCULAR | Status: AC
  Filled 2017-03-23: qty 1

## 2017-03-23 MED ORDER — METHOCARBAMOL 500 MG PO TABS
500.0000 mg | ORAL_TABLET | Freq: Four times a day (QID) | ORAL | Status: DC | PRN
  Administered 2017-03-23 – 2017-03-25 (×6): 500 mg via ORAL
  Filled 2017-03-23 (×7): qty 1

## 2017-03-23 MED ORDER — PROMETHAZINE HCL 25 MG/ML IJ SOLN: 6.2500 mg | INTRAMUSCULAR | Status: DC | PRN

## 2017-03-23 MED ORDER — CHLORHEXIDINE GLUCONATE CLOTH 2 % EX PADS: 6.0000 | MEDICATED_PAD | Freq: Once | CUTANEOUS | Status: DC

## 2017-03-23 MED ORDER — MIDAZOLAM HCL 5 MG/5ML IJ SOLN
INTRAMUSCULAR | Status: DC | PRN
  Administered 2017-03-23: 2 mg via INTRAVENOUS

## 2017-03-23 MED ORDER — LORAZEPAM 0.5 MG PO TABS
0.5000 mg | ORAL_TABLET | Freq: Every day | ORAL | Status: DC
  Administered 2017-03-23 – 2017-03-24 (×2): 0.5 mg via ORAL
  Filled 2017-03-23 (×3): qty 1

## 2017-03-23 MED ORDER — FENTANYL CITRATE (PF) 250 MCG/5ML IJ SOLN
INTRAMUSCULAR | Status: AC
  Filled 2017-03-23: qty 5

## 2017-03-23 MED ORDER — HYDROMORPHONE HCL 1 MG/ML IJ SOLN
1.0000 mg | INTRAMUSCULAR | Status: DC | PRN
  Administered 2017-03-23 – 2017-03-24 (×2): 1 mg via INTRAVENOUS
  Filled 2017-03-23 (×2): qty 1

## 2017-03-23 MED ORDER — LIDOCAINE 2% (20 MG/ML) 5 ML SYRINGE
INTRAMUSCULAR | Status: DC | PRN
  Administered 2017-03-23: 60 mg via INTRAVENOUS

## 2017-03-23 MED ORDER — SODIUM CHLORIDE 0.9% FLUSH
3.0000 mL | Freq: Two times a day (BID) | INTRAVENOUS | Status: DC
  Administered 2017-03-23: 3 mL via INTRAVENOUS

## 2017-03-23 MED ORDER — DEXAMETHASONE SODIUM PHOSPHATE 10 MG/ML IJ SOLN
INTRAMUSCULAR | Status: DC | PRN
  Administered 2017-03-23: 10 mg via INTRAVENOUS

## 2017-03-23 MED ORDER — 0.9 % SODIUM CHLORIDE (POUR BTL) OPTIME
TOPICAL | Status: DC | PRN
  Administered 2017-03-23: 1000 mL

## 2017-03-23 MED ORDER — PROPOFOL 10 MG/ML IV BOLUS
INTRAVENOUS | Status: DC | PRN
Start: 2017-03-23 — End: 2017-03-23
  Administered 2017-03-23: 150 mg via INTRAVENOUS

## 2017-03-23 MED ORDER — CEFAZOLIN SODIUM-DEXTROSE 2-4 GM/100ML-% IV SOLN
2.0000 g | Freq: Three times a day (TID) | INTRAVENOUS | Status: AC
  Administered 2017-03-24: 2 g via INTRAVENOUS
  Filled 2017-03-23: qty 100

## 2017-03-23 MED ORDER — BUPIVACAINE HCL (PF) 0.5 % IJ SOLN
INTRAMUSCULAR | Status: DC | PRN
  Administered 2017-03-23: 5 mL

## 2017-03-23 MED ORDER — ROCURONIUM BROMIDE 10 MG/ML (PF) SYRINGE
PREFILLED_SYRINGE | INTRAVENOUS | Status: AC
  Filled 2017-03-23: qty 5

## 2017-03-23 MED ORDER — THROMBIN 20000 UNITS EX SOLR
CUTANEOUS | Status: AC
  Filled 2017-03-23: qty 20000

## 2017-03-23 MED ORDER — TAMSULOSIN HCL 0.4 MG PO CAPS
0.4000 mg | ORAL_CAPSULE | Freq: Every day | ORAL | Status: DC
  Administered 2017-03-24 – 2017-03-25 (×2): 0.4 mg via ORAL
  Filled 2017-03-23 (×3): qty 1

## 2017-03-23 MED ORDER — MULTIVITAMIN PO LIQD
Freq: Every day | ORAL | Status: DC
Start: 2017-03-23 — End: 2017-03-23

## 2017-03-23 MED ORDER — KETOROLAC TROMETHAMINE 30 MG/ML IJ SOLN
15.0000 mg | Freq: Four times a day (QID) | INTRAMUSCULAR | Status: DC
  Administered 2017-03-24 (×5): 15 mg via INTRAVENOUS
  Filled 2017-03-23 (×5): qty 1

## 2017-03-23 MED ORDER — ONDANSETRON HCL 4 MG/2ML IJ SOLN
INTRAMUSCULAR | Status: DC | PRN
  Administered 2017-03-23: 4 mg via INTRAVENOUS

## 2017-03-23 MED ORDER — ROCURONIUM BROMIDE 100 MG/10ML IV SOLN
INTRAVENOUS | Status: DC | PRN
  Administered 2017-03-23 (×3): 10 mg via INTRAVENOUS
  Administered 2017-03-23: 20 mg via INTRAVENOUS
  Administered 2017-03-23: 10 mg via INTRAVENOUS
  Administered 2017-03-23: 50 mg via INTRAVENOUS

## 2017-03-23 MED ORDER — FUROSEMIDE 40 MG PO TABS
40.0000 mg | ORAL_TABLET | Freq: Every day | ORAL | Status: DC
Start: 2017-03-24 — End: 2017-03-26
  Administered 2017-03-24 – 2017-03-25 (×2): 40 mg via ORAL
  Filled 2017-03-23 (×2): qty 1

## 2017-03-23 MED ORDER — OXYCODONE HCL 5 MG PO TABS
5.0000 mg | ORAL_TABLET | ORAL | Status: DC | PRN
  Administered 2017-03-23 – 2017-03-25 (×10): 10 mg via ORAL
  Filled 2017-03-23 (×11): qty 2

## 2017-03-23 MED ORDER — LACTATED RINGERS IV SOLN: INTRAVENOUS | Status: DC

## 2017-03-23 MED ORDER — ONDANSETRON HCL 4 MG/2ML IJ SOLN: 4.0000 mg | Freq: Four times a day (QID) | INTRAMUSCULAR | Status: DC | PRN

## 2017-03-23 MED ORDER — FENTANYL CITRATE (PF) 100 MCG/2ML IJ SOLN
INTRAMUSCULAR | Status: DC | PRN
  Administered 2017-03-23 (×5): 50 ug via INTRAVENOUS

## 2017-03-23 MED ORDER — SODIUM CHLORIDE 0.9% FLUSH
3.0000 mL | INTRAVENOUS | Status: DC | PRN
Start: 2017-03-23 — End: 2017-03-24

## 2017-03-23 MED ORDER — HYDROMORPHONE HCL 1 MG/ML IJ SOLN
0.2500 mg | INTRAMUSCULAR | Status: DC | PRN
  Administered 2017-03-23 (×4): 0.25 mg via INTRAVENOUS

## 2017-03-23 MED ORDER — BISACODYL 10 MG RE SUPP: 10.0000 mg | Freq: Every day | RECTAL | Status: DC | PRN

## 2017-03-23 MED ORDER — SENNA 8.6 MG PO TABS
1.0000 | ORAL_TABLET | Freq: Two times a day (BID) | ORAL | Status: DC
  Administered 2017-03-23 – 2017-03-24 (×2): 8.6 mg via ORAL
  Filled 2017-03-23 (×4): qty 1

## 2017-03-23 MED ORDER — EPHEDRINE 5 MG/ML INJ
INTRAVENOUS | Status: AC
  Filled 2017-03-23: qty 10

## 2017-03-23 MED ORDER — SODIUM CHLORIDE 0.9 % IV SOLN
INTRAVENOUS | Status: DC | PRN
  Administered 2017-03-23: 17:00:00 via INTRAVENOUS

## 2017-03-23 MED ORDER — DOCUSATE SODIUM 100 MG PO CAPS
100.0000 mg | ORAL_CAPSULE | Freq: Two times a day (BID) | ORAL | Status: DC
  Administered 2017-03-23 – 2017-03-25 (×4): 100 mg via ORAL
  Filled 2017-03-23 (×5): qty 1

## 2017-03-23 MED ORDER — APOAEQUORIN 10 MG PO CAPS: ORAL_CAPSULE | Freq: Every day | ORAL | Status: DC

## 2017-03-23 MED ORDER — ALBUMIN HUMAN 5 % IV SOLN
INTRAVENOUS | Status: DC | PRN
  Administered 2017-03-23: 18:00:00 via INTRAVENOUS

## 2017-03-23 MED ORDER — DEXTROSE 5 % IV SOLN
500.0000 mg | Freq: Four times a day (QID) | INTRAVENOUS | Status: DC | PRN
  Filled 2017-03-23: qty 5

## 2017-03-23 MED ORDER — LACTATED RINGERS IV SOLN
INTRAVENOUS | Status: DC
  Administered 2017-03-23: 12:00:00 via INTRAVENOUS

## 2017-03-23 MED ORDER — GLYCOPYRROLATE 0.2 MG/ML IV SOSY
PREFILLED_SYRINGE | INTRAVENOUS | Status: DC | PRN
  Administered 2017-03-23: .1 mg via INTRAVENOUS

## 2017-03-23 MED ORDER — PHENYLEPHRINE 40 MCG/ML (10ML) SYRINGE FOR IV PUSH (FOR BLOOD PRESSURE SUPPORT)
PREFILLED_SYRINGE | INTRAVENOUS | Status: DC | PRN
  Administered 2017-03-23: 80 ug via INTRAVENOUS

## 2017-03-23 MED ORDER — LIDOCAINE-EPINEPHRINE 1 %-1:100000 IJ SOLN
INTRAMUSCULAR | Status: AC
  Filled 2017-03-23: qty 1

## 2017-03-23 MED ORDER — PHENOL 1.4 % MT LIQD: 1.0000 | OROMUCOSAL | Status: DC | PRN

## 2017-03-23 MED ORDER — MIDAZOLAM HCL 2 MG/2ML IJ SOLN
INTRAMUSCULAR | Status: AC
  Filled 2017-03-23: qty 2

## 2017-03-23 MED ORDER — CEFAZOLIN SODIUM-DEXTROSE 2-4 GM/100ML-% IV SOLN
INTRAVENOUS | Status: AC
  Filled 2017-03-23: qty 100

## 2017-03-23 MED ORDER — VERAPAMIL HCL ER 180 MG PO TBCR
180.0000 mg | EXTENDED_RELEASE_TABLET | Freq: Every day | ORAL | Status: DC
  Administered 2017-03-24 – 2017-03-25 (×2): 180 mg via ORAL
  Filled 2017-03-23 (×2): qty 1

## 2017-03-23 MED ORDER — ACETAMINOPHEN 650 MG RE SUPP: 650.0000 mg | RECTAL | Status: DC | PRN

## 2017-03-23 MED ORDER — HYDROCODONE-ACETAMINOPHEN 5-325 MG PO TABS: 1.0000 | ORAL_TABLET | ORAL | Status: DC | PRN

## 2017-03-23 MED ORDER — THROMBIN 5000 UNITS EX SOLR
OROMUCOSAL | Status: DC | PRN
  Administered 2017-03-23: 17:00:00 via TOPICAL
  Administered 2017-03-23: 5 mL via TOPICAL

## 2017-03-23 MED ORDER — THROMBIN 5000 UNITS EX SOLR
CUTANEOUS | Status: AC
  Filled 2017-03-23: qty 5000

## 2017-03-23 MED ORDER — LEVOTHYROXINE SODIUM 100 MCG PO TABS
150.0000 ug | ORAL_TABLET | Freq: Every day | ORAL | Status: DC
  Administered 2017-03-24 – 2017-03-25 (×2): 150 ug via ORAL
  Filled 2017-03-23 (×2): qty 2

## 2017-03-23 MED ORDER — MEPERIDINE HCL 25 MG/ML IJ SOLN: 6.2500 mg | INTRAMUSCULAR | Status: DC | PRN

## 2017-03-23 MED ORDER — ACETAMINOPHEN 325 MG PO TABS
650.0000 mg | ORAL_TABLET | ORAL | Status: DC | PRN
  Administered 2017-03-23 – 2017-03-24 (×2): 650 mg via ORAL
  Filled 2017-03-23 (×2): qty 2

## 2017-03-23 MED ORDER — SODIUM CHLORIDE 0.9 % IV SOLN: 250.0000 mL | INTRAVENOUS | Status: DC

## 2017-03-23 MED ORDER — ADULT MULTIVITAMIN W/MINERALS CH
1.0000 | ORAL_TABLET | Freq: Every day | ORAL | Status: DC
  Administered 2017-03-24 – 2017-03-25 (×2): 1 via ORAL
  Filled 2017-03-23 (×2): qty 1

## 2017-03-23 MED ORDER — PHENYLEPHRINE HCL 10 MG/ML IJ SOLN
INTRAVENOUS | Status: DC | PRN
  Administered 2017-03-23: 20 ug/min via INTRAVENOUS

## 2017-03-23 MED ORDER — BUPIVACAINE HCL (PF) 0.5 % IJ SOLN
INTRAMUSCULAR | Status: AC
  Filled 2017-03-23: qty 30

## 2017-03-23 MED ORDER — SODIUM CHLORIDE 0.9 % IV SOLN: INTRAVENOUS | Status: DC

## 2017-03-23 MED ORDER — LIDOCAINE-EPINEPHRINE 1 %-1:100000 IJ SOLN
INTRAMUSCULAR | Status: DC | PRN
  Administered 2017-03-23: 5 mL

## 2017-03-23 MED ORDER — ARTIFICIAL TEARS OPHTHALMIC OINT
TOPICAL_OINTMENT | OPHTHALMIC | Status: DC | PRN
  Administered 2017-03-23: 1 via OPHTHALMIC

## 2017-03-23 MED ORDER — SODIUM CHLORIDE 0.9 % IR SOLN
Status: DC | PRN
  Administered 2017-03-23: 500 mL

## 2017-03-23 MED ORDER — EPHEDRINE SULFATE-NACL 50-0.9 MG/10ML-% IV SOSY
PREFILLED_SYRINGE | INTRAVENOUS | Status: DC | PRN
  Administered 2017-03-23 (×4): 5 mg via INTRAVENOUS
  Administered 2017-03-23: 10 mg via INTRAVENOUS
  Administered 2017-03-23: 5 mg via INTRAVENOUS

## 2017-03-23 MED ORDER — POLYETHYLENE GLYCOL 3350 17 G PO PACK: 17.0000 g | PACK | Freq: Every day | ORAL | Status: DC | PRN

## 2017-03-23 MED ORDER — MENTHOL 3 MG MT LOZG: 1.0000 | LOZENGE | OROMUCOSAL | Status: DC | PRN

## 2017-03-23 MED ORDER — ONDANSETRON HCL 4 MG PO TABS
4.0000 mg | ORAL_TABLET | Freq: Four times a day (QID) | ORAL | Status: DC | PRN
Start: 2017-03-23 — End: 2017-03-26

## 2017-03-23 MED ORDER — LIDOCAINE 2% (20 MG/ML) 5 ML SYRINGE
INTRAMUSCULAR | Status: AC
  Filled 2017-03-23: qty 5

## 2017-03-23 MED ORDER — CEFAZOLIN SODIUM-DEXTROSE 2-4 GM/100ML-% IV SOLN
2.0000 g | INTRAVENOUS | Status: AC
  Administered 2017-03-23: 1 g via INTRAVENOUS
  Administered 2017-03-23: 2 g via INTRAVENOUS

## 2017-03-23 MED ORDER — CEFAZOLIN SODIUM 1 G IJ SOLR
INTRAMUSCULAR | Status: AC
  Filled 2017-03-23: qty 10

## 2017-03-23 MED ORDER — ALUM & MAG HYDROXIDE-SIMETH 200-200-20 MG/5ML PO SUSP: 30.0000 mL | Freq: Four times a day (QID) | ORAL | Status: DC | PRN

## 2017-03-23 MED ORDER — SUGAMMADEX SODIUM 200 MG/2ML IV SOLN
INTRAVENOUS | Status: DC | PRN
  Administered 2017-03-23: 200 mg via INTRAVENOUS

## 2017-03-23 MED ORDER — FLEET ENEMA 7-19 GM/118ML RE ENEM: 1.0000 | ENEMA | Freq: Once | RECTAL | Status: DC | PRN

## 2017-03-23 MED ORDER — THROMBIN 20000 UNITS EX SOLR
CUTANEOUS | Status: DC | PRN
  Administered 2017-03-23: 20 mL via TOPICAL

## 2017-03-23 MED ORDER — ARTIFICIAL TEARS OPHTHALMIC OINT
TOPICAL_OINTMENT | OPHTHALMIC | Status: AC
  Filled 2017-03-23: qty 3.5

## 2017-03-23 MED ORDER — LACTATED RINGERS IV SOLN
INTRAVENOUS | Status: DC | PRN
  Administered 2017-03-23 (×3): via INTRAVENOUS

## 2017-03-23 MED ORDER — SUCCINYLCHOLINE CHLORIDE 200 MG/10ML IV SOSY
PREFILLED_SYRINGE | INTRAVENOUS | Status: AC
  Filled 2017-03-23: qty 10

## 2017-03-23 MED ORDER — HYDROMORPHONE HCL 1 MG/ML IJ SOLN
INTRAMUSCULAR | Status: AC
  Filled 2017-03-23: qty 0.5

## 2017-03-23 MED ORDER — LATANOPROST 0.005 % OP SOLN
1.0000 [drp] | Freq: Every day | OPHTHALMIC | Status: DC
  Administered 2017-03-24: 1 [drp] via OPHTHALMIC
  Filled 2017-03-23: qty 2.5

## 2017-03-23 MED ORDER — ONDANSETRON HCL 4 MG/2ML IJ SOLN
INTRAMUSCULAR | Status: AC
  Filled 2017-03-23: qty 2

## 2017-03-23 SURGICAL SUPPLY — 74 items
ADH SKN CLS APL DERMABOND .7 (GAUZE/BANDAGES/DRESSINGS) ×1
APL SRG 60D 8 XTD TIP BNDBL (TIP)
BAG DECANTER FOR FLEXI CONT (MISCELLANEOUS) ×2 IMPLANT
BASKET BONE COLLECTION (BASKET) ×2 IMPLANT
BUR MATCHSTICK NEURO 3.0 LAGG (BURR) ×2 IMPLANT
CAGE COROENT MP 8X23 (Cage) ×4 IMPLANT
CANISTER SUCT 3000ML PPV (MISCELLANEOUS) ×2 IMPLANT
CARTRIDGE OIL MAESTRO DRILL (MISCELLANEOUS) ×1 IMPLANT
CONNECTOR RELINE ROTATING 5-6 (Connector) ×2 IMPLANT
CONT SPEC 4OZ CLIKSEAL STRL BL (MISCELLANEOUS) ×2 IMPLANT
COVER BACK TABLE 60X90IN (DRAPES) ×2 IMPLANT
DECANTER SPIKE VIAL GLASS SM (MISCELLANEOUS) ×2 IMPLANT
DERMABOND ADVANCED (GAUZE/BANDAGES/DRESSINGS) ×1
DERMABOND ADVANCED .7 DNX12 (GAUZE/BANDAGES/DRESSINGS) ×1 IMPLANT
DEVICE DISSECT PLASMABLAD 3.0S (MISCELLANEOUS) ×1 IMPLANT
DIFFUSER DRILL AIR PNEUMATIC (MISCELLANEOUS) ×2 IMPLANT
DRAPE C-ARM 42X72 X-RAY (DRAPES) ×4 IMPLANT
DRAPE HALF SHEET 40X57 (DRAPES) IMPLANT
DRAPE LAPAROTOMY 100X72X124 (DRAPES) ×2 IMPLANT
DRAPE POUCH INSTRU U-SHP 10X18 (DRAPES) ×2 IMPLANT
DRSG OPSITE POSTOP 4X6 (GAUZE/BANDAGES/DRESSINGS) ×1 IMPLANT
DURAPREP 26ML APPLICATOR (WOUND CARE) ×2 IMPLANT
DURASEAL APPLICATOR TIP (TIP) IMPLANT
DURASEAL SPINE SEALANT 3ML (MISCELLANEOUS) IMPLANT
ELECT REM PT RETURN 9FT ADLT (ELECTROSURGICAL) ×2
ELECTRODE REM PT RTRN 9FT ADLT (ELECTROSURGICAL) ×1 IMPLANT
GAUZE SPONGE 4X4 12PLY STRL (GAUZE/BANDAGES/DRESSINGS) ×2 IMPLANT
GAUZE SPONGE 4X4 12PLY STRL LF (GAUZE/BANDAGES/DRESSINGS) ×1 IMPLANT
GAUZE SPONGE 4X4 16PLY XRAY LF (GAUZE/BANDAGES/DRESSINGS) IMPLANT
GLOVE BIO SURGEON STRL SZ8.5 (GLOVE) ×1 IMPLANT
GLOVE BIOGEL PI IND STRL 8.5 (GLOVE) ×2 IMPLANT
GLOVE BIOGEL PI INDICATOR 8.5 (GLOVE) ×4
GLOVE ECLIPSE 7.0 STRL STRAW (GLOVE) ×2 IMPLANT
GLOVE ECLIPSE 8.5 STRL (GLOVE) ×5 IMPLANT
GLOVE SURG SS PI 7.0 STRL IVOR (GLOVE) ×1 IMPLANT
GOWN STRL REUS W/ TWL LRG LVL3 (GOWN DISPOSABLE) IMPLANT
GOWN STRL REUS W/ TWL XL LVL3 (GOWN DISPOSABLE) IMPLANT
GOWN STRL REUS W/TWL 2XL LVL3 (GOWN DISPOSABLE) ×5 IMPLANT
GOWN STRL REUS W/TWL LRG LVL3 (GOWN DISPOSABLE) ×2
GOWN STRL REUS W/TWL XL LVL3 (GOWN DISPOSABLE) ×4
GRAFT BN 10X1XDBM MAGNIFUSE (Bone Implant) IMPLANT
GRAFT BONE MAGNIFUSE 1X10CM (Bone Implant) ×2 IMPLANT
HEMOSTAT POWDER KIT SURGIFOAM (HEMOSTASIS) ×2 IMPLANT
KIT BASIN OR (CUSTOM PROCEDURE TRAY) ×2 IMPLANT
KIT ROOM TURNOVER OR (KITS) ×2 IMPLANT
MILL MEDIUM DISP (BLADE) ×1 IMPLANT
MODULE POWER NUVASIVE (MISCELLANEOUS) IMPLANT
NDL SPNL 18GX3.5 QUINCKE PK (NEEDLE) IMPLANT
NEEDLE HYPO 22GX1.5 SAFETY (NEEDLE) ×2 IMPLANT
NEEDLE SPNL 18GX3.5 QUINCKE PK (NEEDLE) IMPLANT
NS IRRIG 1000ML POUR BTL (IV SOLUTION) ×2 IMPLANT
OIL CARTRIDGE MAESTRO DRILL (MISCELLANEOUS) ×2
PACK LAMINECTOMY NEURO (CUSTOM PROCEDURE TRAY) ×2 IMPLANT
PAD ARMBOARD 7.5X6 YLW CONV (MISCELLANEOUS) ×6 IMPLANT
PATTIES SURGICAL .5 X1 (DISPOSABLE) ×3 IMPLANT
PATTIES SURGICAL 1X1 (DISPOSABLE) ×1 IMPLANT
PLASMABLADE 3.0S (MISCELLANEOUS) ×2
POWER MODULE NUVASIVE (MISCELLANEOUS) ×2
ROD RELINE TI LATERAL MED OFF (Rod) ×2 IMPLANT
SCREW LOCK RELINE 5.5 TULIP (Screw) ×9 IMPLANT
SCREW RELINE-O POLY 6.5X45 (Screw) ×4 IMPLANT
SPONGE LAP 4X18 X RAY DECT (DISPOSABLE) IMPLANT
SPONGE SURGIFOAM ABS GEL 100 (HEMOSTASIS) ×2 IMPLANT
SUT PROLENE 6 0 BV (SUTURE) IMPLANT
SUT VIC AB 1 CT1 18XBRD ANBCTR (SUTURE) ×1 IMPLANT
SUT VIC AB 1 CT1 8-18 (SUTURE) ×4
SUT VIC AB 2-0 CP2 18 (SUTURE) ×3 IMPLANT
SUT VIC AB 3-0 SH 8-18 (SUTURE) ×2 IMPLANT
SYR 3ML LL SCALE MARK (SYRINGE) ×8 IMPLANT
SYR 5ML LL (SYRINGE) IMPLANT
TOWEL GREEN STERILE (TOWEL DISPOSABLE) ×2 IMPLANT
TOWEL GREEN STERILE FF (TOWEL DISPOSABLE) ×2 IMPLANT
TRAY FOLEY W/METER SILVER 16FR (SET/KITS/TRAYS/PACK) ×2 IMPLANT
WATER STERILE IRR 1000ML POUR (IV SOLUTION) ×2 IMPLANT

## 2017-03-23 NOTE — Progress Notes (Signed)
Patient ID: Larry [name], male   DOB: 05/03/1951, 66 y.o.   MRN: [id number] Vital signs are stable Motor function is intact Patient feels comfortable His ambulated already Dressing is clean and dry Stable postop

## 2017-03-23 NOTE — Transfer of Care (Signed)
Immediate Anesthesia Transfer of Care Note  Patient: Larry [name]  Procedure(s) Performed: Procedure(s): Lumbar one-two, Lumbar two-three  Posterior lumbar interbody fusion (N/A)  Patient Location: PACU  Anesthesia Type:General  Level of Consciousness: drowsy  Airway & Oxygen Therapy: Patient Spontanous Breathing and Patient connected to face mask oxygen  Post-op Assessment: Report given to RN and Post -op Vital signs reviewed and stable  Post vital signs: Reviewed and stable  Last Vitals:  Vitals:   03/23/17 1123 03/23/17 1847  BP: (!) 153/84   Pulse: 60   Resp: 18   Temp: 36.7 C 36.5 C    Last Pain:  Vitals:   03/23/17 1847  TempSrc:   PainSc: Asleep         Complications: No apparent anesthesia complications

## 2017-03-23 NOTE — Anesthesia Preprocedure Evaluation (Signed)
Anesthesia Evaluation  Patient identified by MRN, date of birth, ID band Patient awake    History of Anesthesia Complications (+) DIFFICULT AIRWAY  Airway Mallampati: IV  TM Distance: <3 FB Neck ROM: Limited  Mouth opening: Limited Mouth Opening  Dental  (+) Caps, Teeth Intact, Dental Advisory Given   Pulmonary neg pulmonary ROS, former smoker,    breath sounds clear to auscultation       Cardiovascular negative cardio ROS   Rhythm:Regular Rate:Normal     Neuro/Psych  Headaches, Anxiety    GI/Hepatic negative GI ROS, Neg liver ROS,   Endo/Other  Hypothyroidism   Renal/GU negative Renal ROS  negative genitourinary   Musculoskeletal  (+) Arthritis , Osteoarthritis,    Abdominal Normal abdominal exam  (+)   Bowel sounds: normal.  Peds negative pediatric ROS (+)  Hematology negative hematology ROS (+)   Anesthesia Other Findings   Reproductive/Obstetrics negative OB ROS                             Anesthesia Physical Anesthesia Plan  ASA: II  Anesthesia Plan: General   Post-op Pain Management:    Induction: Intravenous  PONV Risk Score and Plan: 1 and Ondansetron and Dexamethasone  Airway Management Planned: Video Laryngoscope Planned and Oral ETT  Additional Equipment:   Intra-op Plan:   Post-operative Plan: Extubation in OR  Informed Consent: I have reviewed the patients History and Physical, chart, labs and discussed the procedure including the risks, benefits and alternatives for the proposed anesthesia with the patient or authorized representative who has indicated his/her understanding and acceptance.   Dental advisory given and Consent reviewed with POA  Plan Discussed with: Surgeon, Anesthesiologist and CRNA  Anesthesia Plan Comments:         Anesthesia Quick Evaluation

## 2017-03-23 NOTE — Anesthesia Procedure Notes (Signed)
Procedure Name: Intubation Date/Time: 03/23/2017 1:52 PM Performed by: Orlie Dakin Pre-anesthesia Checklist: Patient identified, Emergency Drugs available, Suction available, Patient being monitored and Timeout performed Patient Re-evaluated:Patient Re-evaluated prior to inductionOxygen Delivery Method: Circle system utilized Preoxygenation: Pre-oxygenation with 100% oxygen Intubation Type: IV induction Ventilation: Oral airway inserted - appropriate to patient size and Mask ventilation without difficulty Laryngoscope Size: Glidescope and 4 Grade View: Grade II Tube type: Oral Tube size: 7.5 mm Number of attempts: 1 Airway Equipment and Method: Stylet and Video-laryngoscopy Placement Confirmation: ETT inserted through vocal cords under direct vision,  positive ETCO2 and breath sounds checked- equal and bilateral Secured at: 23 cm Tube secured with: Tape Dental Injury: Teeth and Oropharynx as per pre-operative assessment  Comments: Noted upper front crowns, crowns on molars present, none loose per patient report.  Glidescope used due to limited ROM of neck and previous H/O difficult intubation,  4x4s bite block used.

## 2017-03-23 NOTE — Progress Notes (Signed)
Spoke to Dr. Ellene Route.  He would like pt to go to [facility] or [facility].  Pt was given a bed on 5N.  Will create a new bed request.  Also pt is able to have HOB elevated.  Dural tear has been stitched.  Will continue to monitor and notify for further changes.

## 2017-03-23 NOTE — Anesthesia Preprocedure Evaluation (Addendum)
Anesthesia Evaluation  Patient identified by MRN, date of birth, ID band Patient awake    Reviewed: Allergy & Precautions, NPO status , Patient's Chart, lab work & pertinent test results  History of Anesthesia Complications (+) DIFFICULT AIRWAY and history of anesthetic complications  Airway Mallampati: IV  TM Distance: <3 FB Neck ROM: Limited    Dental  (+) Teeth Intact, Dental Advisory Given   Pulmonary asthma , sleep apnea , former smoker,    breath sounds clear to auscultation       Cardiovascular negative cardio ROS   Rhythm:Regular Rate:Normal     Neuro/Psych  Headaches, negative psych ROS   GI/Hepatic negative GI ROS, Neg liver ROS,   Endo/Other  Hypothyroidism   Renal/GU negative Renal ROS  negative genitourinary   Musculoskeletal  (+) Arthritis ,   Abdominal   Peds negative pediatric ROS (+)  Hematology   Anesthesia Other Findings   Reproductive/Obstetrics negative OB ROS                           Lab Results  Component Value Date   WBC 6.2 03/19/2017   HGB 17.1 (H) 03/19/2017   HCT 49.2 03/19/2017   MCV 93.5 03/19/2017   PLT 139 (L) 03/19/2017   Lab Results  Component Value Date   CREATININE 0.88 03/19/2017   BUN 19 03/19/2017   NA 141 03/19/2017   K 4.5 03/19/2017   CL 106 03/19/2017   CO2 31 03/19/2017   Lab Results  Component Value Date   INR 1.15 07/10/2015   EKG: sinus bradycardia.  Anesthesia Physical Anesthesia Plan  ASA: II  Anesthesia Plan: General   Post-op Pain Management:    Induction: Intravenous  PONV Risk Score and Plan: 3 and Ondansetron, Dexamethasone, Propofol and Midazolam  Airway Management Planned: Oral ETT and Video Laryngoscope Planned  Additional Equipment:   Intra-op Plan:   Post-operative Plan: Extubation in OR  Informed Consent: I have reviewed the patients History and Physical, chart, labs and discussed the procedure  including the risks, benefits and alternatives for the proposed anesthesia with the patient or authorized representative who has indicated his/her understanding and acceptance.   Dental advisory given  Plan Discussed with: CRNA  Anesthesia Plan Comments:         Anesthesia Quick Evaluation

## 2017-03-23 NOTE — H&P (Signed)
Larry [name] is an 66 y.o. male.   Chief Complaint: Back and bilateral lower extremity pain and weakness. 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 Responded well to each of the surgical interventions and most recently started developing increasing pain and then weakness in the lower extremities. He is seen and evaluated a little over a month ago and was found to have bilateral foot drops that were mild. An MRI demonstrates presence of a high-grade stenosis at the level of L2-L3 and moderate degenerative changes with moderate stenosis at the level of L1-L2. After careful consideration of his options I advised surgical decompression and stabilization of L1-L3 with fixation to those levels. Is now taken to the operating room to undergo laminectomies decompression and fusion of L1-2 and L2-3 to the previous construct L3-L5.  Past Medical History:  Diagnosis Date  . Arthritis    "knees; left shoulder" (04/12/2013)  . Asthma    "as a child" (04/12/2013)  . DDD (degenerative disc disease), cervical   . DDD (degenerative disc disease), lumbar   . Difficult intubation    2012   CERVICAL FUSION   . Headache(784.0)    VERAPAMIL FOR PREVENTION   . 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 History of chicken pox    as an adult  . Hypoglycemia    last episode 1 yr. ago, just gets jittery  . Hypothyroidism   . OSA (obstructive sleep apnea)    "haven't wore mask in years; had OR; still snore" (04/12/2013)  . Urinary frequency     Past Surgical History:  Procedure Laterality Date  . BACK SURGERY    . CERVICAL FUSION  2002; 2012  . CYSTECTOMY    . INGUINAL HERNIA REPAIR Bilateral 1997  . KNEE ARTHROSCOPY Left ~ 1997; ~ 2005  . LUMBAR FUSION   2007; 04/11/2013  . PENILE DEBRIDEMENT  ~ 2011   X 3  FOR MRSA INFECTION    . SHOULDER ARTHROSCOPY Left ~ 2003  . TONSILLECTOMY  1990's  . TOTAL KNEE ARTHROPLASTY Right 07/23/2015  . TOTAL KNEE ARTHROPLASTY Right 07/23/2015   Procedure: TOTAL KNEE ARTHROPLASTY;  Surgeon: Garald Balding, MD;  Location: [place];  Service: Orthopedics;  Laterality: Right;  . UVULOPALATOPHARYNGOPLASTY (UPPP)/TONSILLECTOMY/SEPTOPLASTY  1990's  . VASECTOMY      History reviewed. No pertinent family history. Social History:  reports that he quit smoking about 30 years ago. His smoking use included Cigarettes. He has a 7.50 pack-year smoking history. He has never used smokeless tobacco. He reports that he does not drink alcohol or use drugs.  Allergies:  Allergies  Allergen Reactions  . No Known Allergies     Medications Prior to Admission  Medication Sig Dispense Refill  . Apoaequorin (PREVAGEN PO) Take 1 tablet by mouth daily.    Marland Kitchen aspirin EC 81 MG tablet Take 81 mg by mouth daily.    . cephALEXin (KEFLEX) 500 MG capsule Take one capsule one hour prior to dental procedure 4 capsule 0  . furosemide (LASIX) 40 MG tablet Take 40 mg by mouth daily.    Marland Kitchen HYDROcodone-acetaminophen (NORCO/VICODIN) 5-325 MG tablet Take 1 tablet by mouth every 4 (four) hours as needed for pain.  0  .  latanoprost (XALATAN) 0.005 % ophthalmic solution Place 1 drop into both eyes at bedtime.  2  . levothyroxine (SYNTHROID, LEVOTHROID) 150 MCG tablet Take 150 mcg by mouth daily before breakfast.    . LORazepam (ATIVAN) 0.5 MG tablet Take 0.5 mg by mouth at bedtime.  1  . meloxicam (MOBIC) 15 MG tablet Take 15 mg by mouth daily.  1  . Multiple Vitamins-Minerals (MULTIVITAMIN PO) Take 1 tablet by mouth daily.    . Oxymetazoline HCl (AFRIN NASAL SPRAY NA) Place 2 sprays into both nostrils 2 (two) times daily as needed (nasal congestion).    . tamsulosin (FLOMAX) 0.4 MG CAPS capsule Take 0.4 mg by mouth daily.    . Testosterone Cypionate  200 MG/ML KIT Inject 100 mg into the muscle every 14 (fourteen) days.    . verapamil (CALAN-SR) 180 MG CR tablet Take 180 mg by mouth daily.  15  . rivaroxaban (XARELTO) 10 MG TABS tablet Take 1 tablet (10 mg total) by mouth daily with breakfast. (Patient not taking: Reported on 03/16/2017) 12 tablet 0    No results found for this or any previous visit (from the past 48 hour(s)). No results found.  Review of Systems  HENT: Negative.   Respiratory: Negative.   Cardiovascular: Negative.   Gastrointestinal: Negative.   Genitourinary: Negative.   Musculoskeletal: Positive for back pain.  Skin: Negative.   Neurological: Positive for tingling, sensory change, focal weakness and weakness.  Endo/Heme/Allergies: Negative.   Psychiatric/Behavioral: Negative.     Blood pressure (!) 153/84, pulse 60, temperature 98.1 F (36.7 C), temperature source Oral, resp. rate 18, height _0  (1.676 m), weight 83.1 kg (183 lb 1.6 oz), SpO2 99 %. Physical Exam  Constitutional: He is oriented to person, place, and time. He appears well-developed and well-nourished.  HENT:  Head: Normocephalic and atraumatic.  Eyes: Conjunctivae and EOM are normal. Pupils are equal, round, and reactive to light.  Neck: Normal range of motion. Neck supple.  Cardiovascular: Normal rate and regular rhythm.   Respiratory: Effort normal and breath sounds normal.  GI: Soft. Bowel sounds are normal.  Musculoskeletal:  Positive straight leg raising at 30 in either lower extremity. Patrick's maneuver is negative. Palpation and percussion of lumbar spine reveal no significant tenderness.  Neurological: He is alert and oriented to person, place, and time.  Mild weakness in tibialis anterior 4 out of 5 bilaterally. Gastroc strength is normal. Tone and bulk are intact. Absent deep tendon reflexes in the patellae and the Achilles both. Station and gait are intact. Cranial nerve examination is normal.  Skin: Skin is warm and dry.   Psychiatric: He has a normal mood and affect. His behavior is normal. Judgment and thought content normal.     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 History of fusion L3-L5.  Plan decompression via laminectomy L1-2 and L2-3 posterior lumbar interbody arthrodesis with peek spacers local autograft allograft and pedicle screw fixation of L1-L3  Earleen Newport, MD 03/23/2017, 11:53 AM

## 2017-03-24 LAB — BASIC METABOLIC PANEL
Anion gap: 8 (ref 5–15)
BUN: 13 mg/dL (ref 6–20)
CO2: 29 mmol/L (ref 22–32)
Calcium: 8.6 mg/dL — ABNORMAL LOW (ref 8.9–10.3)
Chloride: 102 mmol/L (ref 101–111)
Creatinine, Ser: 0.85 mg/dL (ref 0.61–1.24)
GFR calc Af Amer: >60 mL/min (ref >60)
GFR calc non Af Amer: >60 mL/min (ref >60)
Glucose, Bld: 152 mg/dL — ABNORMAL HIGH (ref 65–99)
Potassium: 4.4 mmol/L (ref 3.5–5.1)
Sodium: 139 mmol/L (ref 135–145)

## 2017-03-24 LAB — CBC
HCT: 38.1 % — ABNORMAL LOW (ref 39.0–52.0)
Hemoglobin: 12.8 g/dL — ABNORMAL LOW (ref 13.0–17.0)
MCH: 32.1 pg (ref 26.0–34.0)
MCHC: 33.6 g/dL (ref 30.0–36.0)
MCV: 95.5 fL (ref 78.0–100.0)
Platelets: 146 10*3/uL — ABNORMAL LOW (ref 150–400)
RBC: 3.99 MIL/uL — ABNORMAL LOW (ref 4.22–5.81)
RDW: 14 % (ref 11.5–15.5)
WBC: 13.2 10*3/uL — ABNORMAL HIGH (ref 4.0–10.5)

## 2017-03-24 MED ORDER — MAGNESIUM CITRATE PO SOLN
1.0000 | Freq: Once | ORAL | Status: AC
  Administered 2017-03-24: 1 via ORAL
  Filled 2017-03-24: qty 296

## 2017-03-24 MED FILL — Thrombin For Soln 5000 Unit: CUTANEOUS | Qty: 5000 | Status: AC

## 2017-03-24 NOTE — Evaluation (Signed)
Physical Therapy Evaluation and Discharge Patient Details Name: Larry [name] MRN: [id number] DOB: [birth year] Today's Date: 03/24/2017   History of Present Illness  Pt is a 66 y.o. male s/p decompression via laminectomy L1-2 and L2-3 posterior lumbar interbody arthrodesis with peek spacers local autograft allograft and pedicle screw fixation of L1-L3. He has a PMH significant for arthritis, asthma, cervical and lumbar degenerative disc disease, headache, hemochromatosis, hypothyroidism, and obstructive sleep apnea.  Clinical Impression  Pt ambulating in hallway with wife when PT arrived. Prior to admission, pt was independent with all functional mobility and ADLs. Pt reported he works full-time in [employer] but does not have to perform any physical tasks. Pt ambulated in hallway with no AD, no instability or LOB noted. Pt successfully completed stair training this session as well with no concerns. No further acute PT needs identified at this time. PT signing off.    Follow Up Recommendations No PT follow up    Equipment Recommendations  3in1 (PT)    Recommendations for Other Services       Precautions / Restrictions Precautions Precautions: Back Precaution Booklet Issued: No (OT provided) Precaution Comments: PT reviewed 3/3 back precautions with pt Required Braces or Orthoses: Spinal Brace Spinal Brace: Lumbar corset;Applied in sitting position Restrictions Weight Bearing Restrictions: No      Mobility  Bed Mobility Overal bed mobility: Needs Assistance Bed Mobility: Rolling;Sidelying to Sit Rolling: Supervision Sidelying to sit: Supervision       General bed mobility comments: pt OOB ambulating in hallway when PT arrived  Transfers Overall transfer level: Needs assistance Equipment used: None Transfers: Sit to/from Stand Sit to Stand: Supervision         General transfer comment: pt ambulating in hallway at beginning and end of  session  Ambulation/Gait Ambulation/Gait assistance: Supervision Ambulation Distance (Feet): 200 Feet Assistive device: None Gait Pattern/deviations: Step-through pattern Gait velocity: decreased Gait velocity interpretation: Below normal speed for age/gender General Gait Details: no instability or LOB, supervision for safety  Stairs Stairs: Yes Stairs assistance: Supervision Stair Management: One rail Left;Alternating pattern;Forwards Number of Stairs: 10 General stair comments: no instability or LOB, supervision for safety  Wheelchair Mobility    Modified Rankin (Stroke Patients Only)       Balance Overall balance assessment: No apparent balance deficits (not formally assessed)                                           Pertinent Vitals/Pain Pain Assessment: Faces Faces Pain Scale: Hurts a little bit Pain Location: back at incision Pain Descriptors / Indicators: Operative site guarding;Sore Pain Intervention(s): Monitored during session;Repositioned    Home Living Family/patient expects to be discharged to:: Private residence Living Arrangements: Spouse/significant other Available Help at Discharge: Family;Available PRN/intermittently Type of Home: House Home Access: Stairs to enter Entrance Stairs-Rails: Right;Left;Can reach both Entrance Stairs-Number of Steps: 3 Home Layout: One level Home Equipment: Walker - 2 wheels;Shower seat (pt thinks he has 3-in-1 but wife is checking) Additional Comments: Wife is a PTA    Prior Function Level of Independence: Independent         Comments: 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, does not perform physical tasks)     Hand Dominance   Dominant Hand: Right    Extremity/Trunk Assessment   Upper Extremity Assessment Upper Extremity Assessment: Defer to OT evaluation    Lower Extremity Assessment Lower  Extremity Assessment: Overall WFL for tasks assessed    Cervical / Trunk Assessment Cervical /  Trunk Assessment: Other exceptions Cervical / Trunk Exceptions: s/p spinal surgery  Communication   Communication: No difficulties  Cognition Arousal/Alertness: Awake/alert Behavior During Therapy: WFL for tasks assessed/performed Overall Cognitive Status: Within Functional Limits for tasks assessed                                        General Comments      Exercises     Assessment/Plan    PT Assessment Patent does not need any further PT services  PT Problem List         PT Treatment Interventions      PT Goals (Current goals can be found in the Care Plan section)  Acute Rehab PT Goals Patient Stated Goal: to go home and get back to work    Frequency     Barriers to discharge        Co-evaluation               AM-PAC PT "6 Clicks" Daily Activity  Outcome Measure Difficulty turning over in bed (including adjusting bedclothes, sheets and blankets)?: None Difficulty moving from lying on back to sitting on the side of the bed? : None Difficulty sitting down on and standing up from a chair with arms (e.g., wheelchair, bedside commode, etc,.)?: None Help needed moving to and from a bed to chair (including a wheelchair)?: None Help needed walking in hospital room?: None Help needed climbing 3-5 steps with a railing? : None 6 Click Score: 24    End of Session Equipment Utilized During Treatment: Back brace Activity Tolerance: Patient tolerated treatment well Patient left: with family/visitor present;Other (comment) (ambulating in hallway with spouse) Nurse Communication: Mobility status PT Visit Diagnosis: Pain Pain - part of body:  (back)    Time: 0950-1000 PT Time Calculation (min) (ACUTE ONLY): 10 min   Charges:   PT Evaluation $PT Eval Low Complexity: 1 Procedure     PT G Codes:   PT G-Codes **NOT FOR INPATIENT CLASS** Functional Limitation: Mobility: Walking and moving around Mobility: Walking and Moving Around Current Status  (D6644): 0 percent impaired, limited or restricted Mobility: Walking and Moving Around Goal Status (I3474): 0 percent impaired, limited or restricted Mobility: Walking and Moving Around Discharge Status [phone]): 0 percent impaired, limited or restricted    [facility], PT, DPT [place] 03/24/2017, 10:20 AM

## 2017-03-24 NOTE — Evaluation (Addendum)
Occupational Therapy Evaluation/Discharge Patient Details Name: Larry [name] MRN: [id number] DOB: Apr 09, 1951 Today's Date: 03/24/2017    History of Present Illness Pt is a 66 y.o. male s/p decompression via laminectomy L1-2 and L2-3 posterior lumbar interbody arthrodesis with peek spacers local autograft allograft and pedicle screw fixation of L1-L3. He has a PMH significant for arthritis, asthma, cervical and lumbar degenerative disc disease, headache, hemochromatosis, hypothyroidism, and obstructive sleep apnea.   Clinical Impression   PTA, pt was independent with ADL and functional mobility and was working in Architect. He currently requires supervision for safety with standing and LB ADL tasks with verbal cues to adhere to back precautions. Back handout provided and reviewed adls in detail. Pt educated on: clothing between brace, never sleep in braceavoid sitting for long periods of time, correct bed positioning for sleeping, correct sequence for bed mobility, avoiding lifting more than 5 pounds and never wash directly over incision. [employer] arrived at end of session to fit brace and received MD clearance for mobility without brace prior to its arrival. Educated pt on compensatory strategies to adhere to back precautions during ADL tasks and home modifications to maximize safety and reduce risk of falling. All education is complete and patient indicates understanding. No further acute OT needs identified. OT will sign off.       Follow Up Recommendations  No OT follow up;Supervision - Intermittent    Equipment Recommendations  None recommended by OT    Recommendations for Other Services       Precautions / Restrictions Precautions Precautions: Back Precaution Booklet Issued: Yes (comment) Precaution Comments: Reviewed back precautions in detail.  Required Braces or Orthoses: Spinal Brace Spinal Brace: Lumbar corset;Applied in sitting position Restrictions Weight Bearing  Restrictions: No      Mobility Bed Mobility Overal bed mobility: Needs Assistance Bed Mobility: Rolling;Sidelying to Sit Rolling: Supervision Sidelying to sit: Supervision       General bed mobility comments: Supervision for safety during log roll technique.   Transfers Overall transfer level: Needs assistance Equipment used: None Transfers: Sit to/from Stand Sit to Stand: Supervision         General transfer comment: Supervision for safety.     Balance Overall balance assessment: No apparent balance deficits (not formally assessed)                                         ADL either performed or assessed with clinical judgement   ADL Overall ADL's : Needs assistance/impaired Eating/Feeding: Set up;Sitting   Grooming: Supervision/safety;Standing   Upper Body Bathing: Set up;Sitting   Lower Body Bathing: Supervison/ safety;Sit to/from stand   Upper Body Dressing : Set up;Sitting   Lower Body Dressing: Supervision/safety;Sit to/from stand   Toilet Transfer: Supervision/safety;Ambulation;BSC   Toileting- Water quality scientist and Hygiene: Supervision/safety;Sit to/from stand   Tub/ Shower Transfer: Supervision/safety;Ambulation;Shower seat   Functional mobility during ADLs: Supervision/safety General ADL Comments: Pt educated on compensatory dressing, bathing, grooming, and toileting strategies in order to maximize adherence to back precautions. He required VC's throughout session to adhere to back precautions.      Vision Baseline Vision/History: Wears glasses Patient Visual Report: No change from baseline Vision Assessment?: No apparent visual deficits     Perception     Praxis      Pertinent Vitals/Pain Pain Assessment: Faces Faces Pain Scale: Hurts a little bit Pain Location: back at incision Pain Descriptors /  Indicators: Operative site guarding;Sore Pain Intervention(s): Monitored during session;Repositioned     Hand  Dominance Right   Extremity/Trunk Assessment Upper Extremity Assessment Upper Extremity Assessment: Overall WFL for tasks assessed   Lower Extremity Assessment Lower Extremity Assessment: Overall WFL for tasks assessed   Cervical / Trunk Assessment Cervical / Trunk Assessment: Other exceptions Cervical / Trunk Exceptions: s/p spinal surgery   Communication Communication Communication: No difficulties   Cognition Arousal/Alertness: Awake/alert Behavior During Therapy: WFL for tasks assessed/performed Overall Cognitive Status: Within Functional Limits for tasks assessed                                     General Comments       Exercises     Shoulder Instructions      Home Living Family/patient expects to be discharged to:: Private residence Living Arrangements: Spouse/significant other Available Help at Discharge: Family;Available PRN/intermittently Type of Home: House Home Access: Stairs to enter [employer] of Steps: 3 Entrance Stairs-Rails: Right;Left;Can reach both Home Layout: One level     Bathroom Shower/Tub: Occupational psychologist: Handicapped height Bathroom Accessibility: Yes How Accessible: Accessible via walker Home Equipment: [place] - 2 wheels;Shower seat (pt thinks he has 3-in-1 but wife is checking)   Additional Comments: Wife is a PTA      Prior Functioning/Environment Level of Independence: Independent        Comments: Works in Film/video editor Problem List: Pain      OT Treatment/Interventions:      OT Goals(Current goals can be found in the care plan section) Acute Rehab OT Goals Patient Stated Goal: to go home and get back to work OT Goal Formulation: With patient Time For Goal Achievement: 04/07/17 Potential to Achieve Goals: Good  OT Frequency:     Barriers to D/C:            Co-evaluation              AM-PAC PT "6 Clicks" Daily Activity     Outcome Measure Help from  another person eating meals?: None Help from another person taking care of personal grooming?: A Little Help from another person toileting, which includes using toliet, bedpan, or urinal?: A Little Help from another person bathing (including washing, rinsing, drying)?: A Little Help from another person to put on and taking off regular upper body clothing?: None Help from another person to put on and taking off regular lower body clothing?: A Little 6 Click Score: 20   End of Session Nurse Communication: Mobility status  Activity Tolerance: Patient tolerated treatment well Patient left: in chair;with call bell/phone within reach  OT Visit Diagnosis: Other abnormalities of gait and mobility (R26.89);Pain Pain - Right/Left:  (back) Pain - part of body:  (back)                Time: 0240-9735 OT Time Calculation (min): 28 min Charges:  OT General Charges $OT Visit: 1 Procedure OT Evaluation $OT Eval Moderate Complexity: 1 Procedure OT Treatments $Self Care/Home Management : 8-22 mins G-Codes:     Norman Herrlich, MS OTR/L  Pager: [place] A Christeen Lai 03/24/2017, 9:13 AM

## 2017-03-24 NOTE — Op Note (Signed)
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 Postoperative diagnosis: Same Procedure: Laminectomy L1-2 and L2-3 decompression of L1-L2 and L3 nerve roots and the central spinal canal. Posterior lumbar interbody arthrodesis with peek spacers local autograft L12 and L2-3 posterior lateral arthrodesis with local autograft and allograft L1-2 and L2-3 segment of fixation L1 L3. Surgeon: Kristeen Miss First assistant: Erline Levine, M.D. Anesthesia: Gen. endotracheal Indications: Larry [name] did not O is a 66 year old individual who's previously undergone decompression and fusion at L4-5 and then L3-4 and now has develops adjacent level disease at L1-2 and L2-3 been advised regarding the need for surgical decompression arthrodesis at these levels.  Procedure: The patient was brought to the operating room supine on a stretcher. After the smooth induction of general endotracheal anesthesia, was turned prone. The back was prepped with alcohol and DuraPrep and draped in a sterile fashion. A midline incision was created at the superior portion of the previous incision and the dissection was carried down to the lumbar dorsal fascia. The fascia was opened on either side of midline to expose the superior most aspect of the old hardware. Then by dissecting the superior screws the rod inferior to this was exposed and isolated. A SideArm connector could be placed on each rod for later fixation to the L3 apparatus. Attention was then turned to the interlaminar space at L2-3 and L1-2. Laminectomies were then created removing the inferior margin lamina of L1-L2 and including the entirety of the facet at L1-L2. The entire laminar arch of L2 was then resected removing the facet joint at the L2-3 space. This bone was cleaned and use for grafting. The dissection then continued by removing the thickened redundant yellow ligament out to the  lateral recesses and exposing and decompressing the L1 nerve root superiorly the L2 nerve root in the midportion and the L3 nerve root inferiorly. This allowed for isolation of the disc spaces. Once the nerve roots were well decompressed the disc was entered at L2-3 and a significant total discectomy was performed on each side at L2-3. Interspace was then distracted and sized for appropriate size spacer. Is felt that an 8 mm tall 4 lordotic spacer would fit best into this interspace. This was then packed with autograft and a total of 6 mL of autograft was placed into the interspace at L2-3. A similar dissection was then performed at L1-2 and again 8 mm tall 4 lordotic spacers measuring 25 mm in length were placed into the interspace. 6 mL of bone graft was also placed here. Lateral gutters were decorticated between the transverse processes of L1-L2 and L3 and allograft bone was laid into these lateral gutters on each side. Autograft bone was also placed into this region. Pedicle entry sites were chosen at L1-L2 and 6.5 x 45 mm pedicle screws were placed. Then 0 was cut to the appropriate length and connected to the screws at L1-L2 and the L2-3 SideArm connectors. He is were torqued in a neutral construct. Final radiographs were obtained in AP and lateral projection. The area was inspected to make sure that the L1-L2 and L3 nerve roots and the common dural tube were well decompressed. A small dural tear occurred dorsally and this was oversewn with a singular 6-0 Prolene suture. The wound was then irrigated copiously and closed with #1 Vicryl in the lumbar dorsal fascia 2-0 Vicryl subcutaneous anus tissue and 3-0 Vicryl subcuticularly. Blood loss was estimated 700 mL, 300 mL  of Cell Saver blood was returned to the patient. He tolerated procedure well.

## 2017-03-24 NOTE — Progress Notes (Signed)
Patient ID: Larry [name], male   DOB: May 23, 1951, 66 y.o.   MRN: [id number] Vital signs stable Patient has some moderate back soreness but is otherwise ambulatory Postoperative hemoglobin and hematocrit is 12 and 38 Motor function appears good Incision is clean and dry Continue to mobilize today

## 2017-03-25 MED ORDER — DIAZEPAM 5 MG PO TABS: 5.0000 mg | ORAL_TABLET | Freq: Four times a day (QID) | ORAL | 0 refills | Status: DC | PRN

## 2017-03-25 MED ORDER — OXYCODONE-ACETAMINOPHEN 5-325 MG PO TABS: 1.0000 | ORAL_TABLET | ORAL | 0 refills | Status: DC | PRN

## 2017-03-25 MED FILL — Sodium Chloride IV Soln 0.9%: INTRAVENOUS | Qty: 1000 | Status: AC

## 2017-03-25 MED FILL — Heparin Sodium (Porcine) Inj 1000 Unit/ML: INTRAMUSCULAR | Qty: 30 | Status: AC

## 2017-03-25 NOTE — Discharge Summary (Signed)
Physician Discharge Summary  Patient ID: Larry [name] MRN: [id number] DOB/AGE: [birth year] 66 y.o.  Admit date: 03/23/2017 Discharge date: 03/25/2017  Admission Diagnoses:Spondylosis and stenosis L1-2 and L2-3 status post decompression and fusion L3-L5.  Discharge Diagnoses: Spondylosis and stenosis L1-2 and L2-3 status post decompression and fusion L3-L5.  Active Problems:   Lumbar stenosis with neurogenic claudication   Discharged Condition: good  Hospital Course: Patient was admitted to undergo surgical decompression and stabilization from L1-L3. He tolerated surgery well.  Consults: None  Significant Diagnostic Studies: None  Treatments: surgery: Decompression and fusion L1-L3.  Discharge Exam: Blood pressure 107/63, pulse 76, temperature 98.3 F (36.8 C), temperature source Oral, resp. rate 20, height '5\' 6"'$  (1.676 m), weight 83.1 kg (183 lb 1.6 oz), SpO2 99 %. Station and gait are intact. Incision is clean and dry.  Disposition: Discharge home  Discharge Instructions    Call MD for:  redness, tenderness, or signs of infection (pain, swelling, redness, odor or green/yellow discharge around incision site)    Complete by:  As directed    Call MD for:  severe uncontrolled pain    Complete by:  As directed    Call MD for:  temperature >100.4    Complete by:  As directed    Diet - low sodium heart healthy    Complete by:  As directed    Discharge instructions    Complete by:  As directed    Okay to shower. Do not apply salves or appointments to incision. No heavy lifting with the upper extremities greater than 15 pounds. May resume driving when not requiring pain medication and patient feels comfortable with doing so.   Incentive spirometry RT    Complete by:  As directed    Increase activity slowly    Complete by:  As directed      Allergies as of 03/25/2017      Reactions   No Known Allergies       Medication List    TAKE these medications   AFRIN NASAL  SPRAY NA Place 2 sprays into both nostrils 2 (two) times daily as needed (nasal congestion).   aspirin EC 81 MG tablet Take 81 mg by mouth daily.   cephALEXin 500 MG capsule Commonly known as:  KEFLEX Take one capsule one hour prior to dental procedure   diazepam 5 MG tablet Commonly known as:  VALIUM Take 1 tablet (5 mg total) by mouth every 6 (six) hours as needed for muscle spasms.   furosemide 40 MG tablet Commonly known as:  LASIX Take 40 mg by mouth daily.   HYDROcodone-acetaminophen 5-325 MG tablet Commonly known as:  NORCO/VICODIN Take 1 tablet by mouth every 4 (four) hours as needed for pain.   latanoprost 0.005 % ophthalmic solution Commonly known as:  XALATAN Place 1 drop into both eyes at bedtime.   levothyroxine 150 MCG tablet Commonly known as:  SYNTHROID, LEVOTHROID Take 150 mcg by mouth daily before breakfast.   LORazepam 0.5 MG tablet Commonly known as:  ATIVAN Take 0.5 mg by mouth at bedtime.   meloxicam 15 MG tablet Commonly known as:  MOBIC Take 15 mg by mouth daily.   MULTIVITAMIN PO Take 1 tablet by mouth daily.   oxyCODONE-acetaminophen 5-325 MG tablet Commonly known as:  ROXICET Take 1-2 tablets by mouth every 4 (four) hours as needed for severe pain.   PREVAGEN PO Take 1 tablet by mouth daily.   rivaroxaban 10 MG Tabs tablet Commonly known as:  XARELTO Take 1 tablet (10 mg total) by mouth daily with breakfast.   tamsulosin 0.4 MG Caps capsule Commonly known as:  FLOMAX Take 0.4 mg by mouth daily.   Testosterone Cypionate 200 MG/ML Kit Inject 100 mg into the muscle every 14 (fourteen) days.   verapamil 180 MG CR tablet Commonly known as:  CALAN-SR Take 180 mg by mouth daily.        SignedEarleen Newport 03/25/2017, 9:09 AM

## 2017-03-25 NOTE — Anesthesia Postprocedure Evaluation (Signed)
Anesthesia Post Note  Patient: Larry [name]  Procedure(s) Performed: Procedure(s) (LRB): Lumbar one-two, Lumbar two-three  Posterior lumbar interbody fusion (N/A)     Patient location during evaluation: PACU Anesthesia Type: General Level of consciousness: awake and alert Pain management: pain level controlled Vital Signs Assessment: post-procedure vital signs reviewed and stable Respiratory status: spontaneous breathing, nonlabored ventilation, respiratory function stable and patient connected to nasal cannula oxygen Cardiovascular status: blood pressure returned to baseline and stable Postop Assessment: no signs of nausea or vomiting Anesthetic complications: no    Last Vitals:  Vitals:   03/25/17 0755 03/25/17 0801  BP: (!) 145/74 107/63  Pulse: (!) 50 76  Resp: 20 20  Temp: 36.5 C 36.8 C    Last Pain:  Vitals:   03/25/17 0801  TempSrc: Oral  PainSc:                  [name]

## 2017-03-25 NOTE — Progress Notes (Signed)
Patient alert and oriented, mae's well, voiding adequate amount of urine, swallowing without difficulty, no c/o pain at time of discharge. Patient discharged home with family. Script and discharged instructions given to patient. Patient and family stated understanding of instructions given. Patient has an appointment with Dr. Elsner  

## 2017-07-18 DIAGNOSIS — Z113 Encounter for screening for infections with a predominantly sexual mode of transmission: Secondary | ICD-10-CM | POA: Diagnosis not present

## 2017-07-18 DIAGNOSIS — R319 Hematuria, unspecified: Secondary | ICD-10-CM | POA: Diagnosis not present

## 2017-07-18 DIAGNOSIS — R300 Dysuria: Secondary | ICD-10-CM | POA: Diagnosis not present

## 2017-07-22 DIAGNOSIS — H1033 Unspecified acute conjunctivitis, bilateral: Secondary | ICD-10-CM | POA: Diagnosis not present

## 2017-07-27 DIAGNOSIS — N401 Enlarged prostate with lower urinary tract symptoms: Secondary | ICD-10-CM | POA: Diagnosis not present

## 2017-07-27 DIAGNOSIS — R350 Frequency of micturition: Secondary | ICD-10-CM | POA: Diagnosis not present

## 2017-07-27 DIAGNOSIS — N410 Acute prostatitis: Secondary | ICD-10-CM | POA: Diagnosis not present

## 2017-07-28 DIAGNOSIS — R030 Elevated blood-pressure reading, without diagnosis of hypertension: Secondary | ICD-10-CM | POA: Diagnosis not present

## 2017-07-28 DIAGNOSIS — M48061 Spinal stenosis, lumbar region without neurogenic claudication: Secondary | ICD-10-CM | POA: Diagnosis not present

## 2017-07-28 DIAGNOSIS — Z6829 Body mass index (BMI) 29.0-29.9, adult: Secondary | ICD-10-CM | POA: Diagnosis not present

## 2017-08-04 DIAGNOSIS — E291 Testicular hypofunction: Secondary | ICD-10-CM | POA: Diagnosis not present

## 2017-08-04 DIAGNOSIS — Z23 Encounter for immunization: Secondary | ICD-10-CM | POA: Diagnosis not present

## 2017-08-04 DIAGNOSIS — Z0000 Encounter for general adult medical examination without abnormal findings: Secondary | ICD-10-CM | POA: Diagnosis not present

## 2017-08-04 DIAGNOSIS — R413 Other amnesia: Secondary | ICD-10-CM | POA: Diagnosis not present

## 2017-08-06 DIAGNOSIS — I10 Essential (primary) hypertension: Secondary | ICD-10-CM | POA: Diagnosis not present

## 2017-08-06 DIAGNOSIS — E291 Testicular hypofunction: Secondary | ICD-10-CM | POA: Diagnosis not present

## 2017-08-06 DIAGNOSIS — R972 Elevated prostate specific antigen [PSA]: Secondary | ICD-10-CM | POA: Diagnosis not present

## 2017-08-06 DIAGNOSIS — E039 Hypothyroidism, unspecified: Secondary | ICD-10-CM | POA: Diagnosis not present

## 2017-08-12 DIAGNOSIS — N529 Male erectile dysfunction, unspecified: Secondary | ICD-10-CM | POA: Diagnosis not present

## 2017-08-12 DIAGNOSIS — R413 Other amnesia: Secondary | ICD-10-CM | POA: Diagnosis not present

## 2017-08-12 DIAGNOSIS — R972 Elevated prostate specific antigen [PSA]: Secondary | ICD-10-CM | POA: Diagnosis not present

## 2017-08-12 DIAGNOSIS — M150 Primary generalized (osteo)arthritis: Secondary | ICD-10-CM | POA: Diagnosis not present

## 2017-08-12 DIAGNOSIS — R002 Palpitations: Secondary | ICD-10-CM | POA: Diagnosis not present

## 2017-08-12 DIAGNOSIS — E291 Testicular hypofunction: Secondary | ICD-10-CM | POA: Diagnosis not present

## 2017-08-12 DIAGNOSIS — E039 Hypothyroidism, unspecified: Secondary | ICD-10-CM | POA: Diagnosis not present

## 2017-08-12 DIAGNOSIS — Z5181 Encounter for therapeutic drug level monitoring: Secondary | ICD-10-CM | POA: Diagnosis not present

## 2017-08-12 DIAGNOSIS — I10 Essential (primary) hypertension: Secondary | ICD-10-CM | POA: Diagnosis not present

## 2017-10-14 DIAGNOSIS — Z6830 Body mass index (BMI) 30.0-30.9, adult: Secondary | ICD-10-CM | POA: Diagnosis not present

## 2017-10-14 DIAGNOSIS — M48061 Spinal stenosis, lumbar region without neurogenic claudication: Secondary | ICD-10-CM | POA: Diagnosis not present

## 2017-10-14 DIAGNOSIS — R030 Elevated blood-pressure reading, without diagnosis of hypertension: Secondary | ICD-10-CM | POA: Diagnosis not present

## 2017-10-20 ENCOUNTER — Other Ambulatory Visit (HOSPITAL_COMMUNITY): Payer: Self-pay | Admitting: Neurological Surgery

## 2017-10-20 ENCOUNTER — Other Ambulatory Visit: Payer: Self-pay | Admitting: Neurological Surgery

## 2017-10-20 DIAGNOSIS — M48061 Spinal stenosis, lumbar region without neurogenic claudication: Secondary | ICD-10-CM

## 2017-10-28 DIAGNOSIS — J069 Acute upper respiratory infection, unspecified: Secondary | ICD-10-CM | POA: Diagnosis not present

## 2017-10-30 DIAGNOSIS — R05 Cough: Secondary | ICD-10-CM | POA: Diagnosis not present

## 2017-10-30 DIAGNOSIS — J019 Acute sinusitis, unspecified: Secondary | ICD-10-CM | POA: Diagnosis not present

## 2017-11-03 ENCOUNTER — Ambulatory Visit (HOSPITAL_COMMUNITY)
Admission: RE | Admit: 2017-11-03 | Discharge: 2017-11-03 | Disposition: A | Discharge status: Home / Self Care | Payer: Medicare Other | Source: Ambulatory Visit | Attending: Neurological Surgery | Admitting: Neurological Surgery

## 2017-11-03 DIAGNOSIS — M5137 Other intervertebral disc degeneration, lumbosacral region: Secondary | ICD-10-CM | POA: Insufficient documentation

## 2017-11-03 DIAGNOSIS — M5416 Radiculopathy, lumbar region: Secondary | ICD-10-CM | POA: Insufficient documentation

## 2017-11-03 DIAGNOSIS — Z981 Arthrodesis status: Secondary | ICD-10-CM | POA: Insufficient documentation

## 2017-11-03 DIAGNOSIS — M48061 Spinal stenosis, lumbar region without neurogenic claudication: Secondary | ICD-10-CM | POA: Diagnosis not present

## 2017-11-03 DIAGNOSIS — M5116 Intervertebral disc disorders with radiculopathy, lumbar region: Secondary | ICD-10-CM | POA: Diagnosis not present

## 2017-11-03 DIAGNOSIS — I700 Atherosclerosis of aorta: Secondary | ICD-10-CM | POA: Insufficient documentation

## 2017-11-03 MED ORDER — DIAZEPAM 5 MG PO TABS
10.0000 mg | ORAL_TABLET | Freq: Once | ORAL | Status: AC
  Administered 2017-11-03: 10 mg via ORAL
  Filled 2017-11-03: qty 2

## 2017-11-03 MED ORDER — ONDANSETRON HCL 4 MG/2ML IJ SOLN: 4.0000 mg | Freq: Four times a day (QID) | INTRAMUSCULAR | Status: DC | PRN

## 2017-11-03 MED ORDER — LIDOCAINE HCL (PF) 1 % IJ SOLN
5.0000 mL | Freq: Once | INTRAMUSCULAR | Status: AC
  Administered 2017-11-03: 2 mL via INTRADERMAL

## 2017-11-03 MED ORDER — LIDOCAINE HCL (PF) 1 % IJ SOLN
INTRAMUSCULAR | Status: AC
  Administered 2017-11-03: 2 mL via INTRADERMAL
  Filled 2017-11-03: qty 5

## 2017-11-03 MED ORDER — IOPAMIDOL (ISOVUE-M 200) INJECTION 41%
INTRAMUSCULAR | Status: AC
  Administered 2017-11-03: 12 mL via INTRATHECAL
  Filled 2017-11-03: qty 10

## 2017-11-03 MED ORDER — HYDROCODONE-ACETAMINOPHEN 5-325 MG PO TABS: 1.0000 | ORAL_TABLET | ORAL | Status: DC | PRN

## 2017-11-03 MED ORDER — DIAZEPAM 5 MG PO TABS
ORAL_TABLET | ORAL | Status: AC
  Administered 2017-11-03: 10 mg via ORAL
  Filled 2017-11-03: qty 2

## 2017-11-03 MED ORDER — IOPAMIDOL (ISOVUE-M 200) INJECTION 41%
20.0000 mL | Freq: Once | INTRAMUSCULAR | Status: AC
  Administered 2017-11-03: 12 mL via INTRATHECAL

## 2017-11-03 NOTE — Procedures (Signed)
Amir did not of is a 67 year old individual who has had significant back and left lower extremity pain. He did and oto-has had significant surgical intervention and he had a fusion from L3-L5. He had adjacent level disease at L2-3 and L1-2 and in the past years undergone surgical decompression and fusion from L1 to his previous fusion L3-L5. Is having persistent left lumbar radicular symptoms and further evaluation is now being performed with myelography to include flexion-extension films. His arthrodesis appears to be healing well from L1-L5. Because of the substantial amount of implanted metal a myelogram has been suggested to further evaluate his lumbar spine with motion films.  Pre op Dx: Lumbar radiculopathy. History of fusion L1-L5 Post op Dx: Lumbar radiculopathy history of fusion L1 L5 Procedure: Lumbar myelogram Surgeon: Craven Crean Puncture level: L2-3 Fluid color: Clear colorless Injection: Isovue-200, 12 mL Findings: Good decompression from L1-L5. Further evaluation with CT scanning to include up to T 10. No abnormal motion. Vacuum disc phenomenon at L5-S1.

## 2017-11-03 NOTE — Discharge Instructions (Signed)
Myelogram, Care After °Refer to this sheet in the next few weeks. These instructions provide you with information about caring for yourself after your procedure. Your health care provider may also give you more specific instructions. Your treatment has been planned according to current medical practices, but problems sometimes occur. Call your health care provider if you have any problems or questions after your procedure. °What can I expect after the procedure? °After the procedure, it is common to have: °· Soreness at your injection site. °· A mild headache. ° °Follow these instructions at home: °· Drink enough fluid to keep your urine clear or pale yellow. This will help flush out the dye (contrast material) from your spine. °· Rest as told by your health care provider. Lie flat with your head slightly raised (elevated) to reduce the risk of headache. °· Do not bend, lift, or do any strenuous activity for 24-48 hours or as told by your health care provider. °· Take over-the-counter and prescription medicines only as told by your health care provider. °· Take care of and remove your bandage (dressing) as told by your health care provider. °· Bathe or shower as told by your health care provider. °Contact a health care provider if: °· You have a fever. °· You have a headache that lasts longer than 24 hours. °· You feel nauseous or vomit. °· You have a stiff neck or numbness in your legs. °· You are unable to urinate or have a bowel movement. °· You develop a rash, itching, or sneezing. °Get help right away if: °· You have new symptoms or your symptoms get worse. °· You have a seizure. °· You have trouble breathing. °This information is not intended to replace advice given to you by your health care provider. Make sure you discuss any questions you have with your health care provider. °Document Released: 10/11/2015 Document Revised: 02/20/2016 Document Reviewed: 06/27/2015 °Elsevier Interactive Patient Education ©  2018 [employer] ° °

## 2017-11-04 DIAGNOSIS — M5416 Radiculopathy, lumbar region: Secondary | ICD-10-CM | POA: Diagnosis not present

## 2017-11-26 DIAGNOSIS — M4807 Spinal stenosis, lumbosacral region: Secondary | ICD-10-CM | POA: Diagnosis not present

## 2017-11-26 DIAGNOSIS — Z981 Arthrodesis status: Secondary | ICD-10-CM | POA: Diagnosis not present

## 2017-11-26 DIAGNOSIS — M48062 Spinal stenosis, lumbar region with neurogenic claudication: Secondary | ICD-10-CM | POA: Diagnosis not present

## 2017-11-26 DIAGNOSIS — M5117 Intervertebral disc disorders with radiculopathy, lumbosacral region: Secondary | ICD-10-CM | POA: Diagnosis not present

## 2017-11-26 DIAGNOSIS — M48061 Spinal stenosis, lumbar region without neurogenic claudication: Secondary | ICD-10-CM | POA: Diagnosis not present

## 2017-11-26 DIAGNOSIS — M5416 Radiculopathy, lumbar region: Secondary | ICD-10-CM | POA: Diagnosis not present

## 2017-12-01 ENCOUNTER — Other Ambulatory Visit (HOSPITAL_COMMUNITY): Payer: Self-pay | Admitting: Neurological Surgery

## 2017-12-01 DIAGNOSIS — M7989 Other specified soft tissue disorders: Secondary | ICD-10-CM

## 2017-12-01 DIAGNOSIS — M79604 Pain in right leg: Secondary | ICD-10-CM

## 2017-12-02 ENCOUNTER — Ambulatory Visit (INDEPENDENT_AMBULATORY_CARE_PROVIDER_SITE_OTHER): Payer: Medicare Other | Admitting: Orthopaedic Surgery

## 2017-12-02 ENCOUNTER — Encounter (INDEPENDENT_AMBULATORY_CARE_PROVIDER_SITE_OTHER): Payer: Self-pay | Admitting: Orthopaedic Surgery

## 2017-12-02 ENCOUNTER — Ambulatory Visit (INDEPENDENT_AMBULATORY_CARE_PROVIDER_SITE_OTHER): Payer: Medicare Other

## 2017-12-02 ENCOUNTER — Ambulatory Visit (HOSPITAL_COMMUNITY)
Admission: RE | Admit: 2017-12-02 | Discharge: 2017-12-02 | Disposition: A | Discharge status: Home / Self Care | Payer: Medicare Other | Source: Ambulatory Visit | Attending: Internal Medicine | Admitting: Internal Medicine

## 2017-12-02 ENCOUNTER — Other Ambulatory Visit (HOSPITAL_COMMUNITY): Payer: Self-pay | Admitting: Neurological Surgery

## 2017-12-02 VITALS — BP 131/68 | HR 76 | Ht 66.0 in | Wt 183.0 lb

## 2017-12-02 DIAGNOSIS — G8929 Other chronic pain: Secondary | ICD-10-CM | POA: Diagnosis not present

## 2017-12-02 DIAGNOSIS — M79605 Pain in left leg: Secondary | ICD-10-CM | POA: Insufficient documentation

## 2017-12-02 DIAGNOSIS — M25562 Pain in left knee: Secondary | ICD-10-CM

## 2017-12-02 DIAGNOSIS — M1712 Unilateral primary osteoarthritis, left knee: Secondary | ICD-10-CM | POA: Diagnosis not present

## 2017-12-02 DIAGNOSIS — M7989 Other specified soft tissue disorders: Secondary | ICD-10-CM | POA: Diagnosis not present

## 2017-12-02 DIAGNOSIS — M79604 Pain in right leg: Secondary | ICD-10-CM

## 2017-12-02 MED ORDER — BUPIVACAINE HCL 0.5 % IJ SOLN
2.0000 mL | INTRAMUSCULAR | Status: AC | PRN
  Administered 2017-12-02: 2 mL via INTRA_ARTICULAR

## 2017-12-02 MED ORDER — METHYLPREDNISOLONE ACETATE 40 MG/ML IJ SUSP
80.0000 mg | INTRAMUSCULAR | Status: AC | PRN
  Administered 2017-12-02: 80 mg

## 2017-12-02 MED ORDER — LIDOCAINE HCL 1 % IJ SOLN
2.0000 mL | INTRAMUSCULAR | Status: AC | PRN
  Administered 2017-12-02: 2 mL

## 2017-12-02 NOTE — Progress Notes (Signed)
VASCULAR LAB PRELIMINARY  PRELIMINARY  PRELIMINARY  PRELIMINARY  Left lower extremity venous duplex completed.    Preliminary report:  There is no DVT or SVT noted in the left lower extremity.   Julianne Chamberlin, RVT 12/02/2017, 9:09 AM

## 2017-12-02 NOTE — Progress Notes (Signed)
Office Visit Note   Patient: Larry [name]           Date of Birth: 1951/08/25           MRN: [id number] Visit Date: 12/02/2017              Requested by: Larry [name], [place] Larry [name], Larry [name] [id number] PCP: Larry Seashore, MD   Assessment & Plan: Visit Diagnoses:  1. Unilateral primary osteoarthritis, left knee   2. Chronic pain of left knee     Plan:  #1: Corticosteroid injection placed to the left knee.  Tolerated the procedure well. #2: If he persists with this in the near future and certainly total joint replacement may be indicated.  Follow-Up Instructions: Return if symptoms worsen or fail to improve.   Orders:  Orders Placed This Encounter  Procedures  . XR Knee Complete 4 Views Left   No orders of the defined types were placed in this encounter.     Procedures: Large Joint Inj: L knee on 12/02/2017 2:25 PM Indications: pain and diagnostic evaluation Details: 25 G 1.5 in needle, anteromedial approach  Arthrogram: No  Medications: 2 mL lidocaine 1 %; 2 mL bupivacaine 0.5 %; 80 mg methylPREDNISolone acetate 40 MG/ML Procedure, treatment alternatives, risks and benefits explained, specific risks discussed. Consent was given by the patient. Patient was prepped and draped in the usual sterile fashion.       Clinical Data: No additional findings.   Subjective: Chief Complaint  Patient presents with  . Left Knee - Pain    Larry [name] is 4 y o m here for left knee pain. He had pain for a few years but had surgery Friday and noticed pain in the left knee has worsened, no injury to knee.     HPI  Larry [name] is a very pleasant 67 year old white male who is seen today for evaluation of his left knee.  He has had a right total knee arthroplasty with good results.  He was at that time noted to have OA of the left knee.  He did well until most recently when he had a laminectomy in the past 2 days by Dr. Ellene [name].  He started  developing pain in his left knee.  The surgery was done on his right lumbar spine.  He denies any history of injury or trauma.  He was actually coping with his arthritis until this most recent episode postoperatively.  His biggest problem is having difficulty with the upstairs.  Does not remember any history of injury or trauma.  Seen for evaluation.  Review of Systems  Constitutional: Negative for fatigue and fever.  HENT: Negative for ear pain.   Eyes: Negative for pain.  Respiratory: Negative for cough and shortness of breath.   Cardiovascular: Negative for chest pain, palpitations and leg swelling.  Gastrointestinal: Negative for blood in stool, constipation and diarrhea.  Genitourinary: Negative for dysuria.  Musculoskeletal: Positive for back pain. Negative for neck pain.  Skin: Negative for rash and wound.  Allergic/Immunologic: Negative for food allergies.  Neurological: Positive for weakness and numbness.  Hematological: Bruises/bleeds easily.  Psychiatric/Behavioral: Negative for sleep disturbance.     Objective: Vital Signs: BP 131/68 (BP Location: Right Arm, Patient Position: Sitting, Cuff Size: Normal)   Pulse 76   Ht 5\' 6"  (1.676 m)   Wt 183 lb (83 kg)   BMI 29.54 kg/m   Physical Exam  Constitutional: He is oriented to person, place, and time.  He appears well-developed and well-nourished.  HENT:  Head: Normocephalic and atraumatic.  Eyes: EOM are normal. Pupils are equal, round, and reactive to light.  Pulmonary/Chest: Effort normal.  Neurological: He is alert and oriented to person, place, and time.  Skin: Skin is warm and dry.  Psychiatric: He has a normal mood and affect. His behavior is normal. Judgment and thought content normal.    Ortho Exam  Today exam reveals a trace effusion.  No warmth or erythema.  Patellofemoral crepitance with range of motion.  Does have some pseudolaxity with valgus stressing but has a good endpoint.  Range of motion from about 3  degrees to 105 degrees.  Calf is supple nontender.  Neurovascular intact distally.  Specialty Comments:  No specialty comments available.  Imaging: Xr Knee Complete 4 Views Left  Result Date: 12/02/2017 Three-view x-ray of the left knee reveals significant patellofemoral osteoarthritis with multiple spurs and marked joint space narrowing.  Medially does have near bone-on-bone OA.  Sclerotic borders on the medial tibial plateau.  He also has some medial femoral condyle spurring.    PMFS History: Patient Active Problem List   Diagnosis Date Noted  . Lumbar stenosis with neurogenic claudication 03/23/2017  . Hypothyroidism 07/25/2015  . Primary osteoarthritis of right knee 07/23/2015  . Primary osteoarthritis of knee 07/23/2015   Past Medical History:  Diagnosis Date  . Arthritis    "knees; left shoulder" (04/12/2013)  . Asthma    "as a child" (04/12/2013)  . DDD (degenerative disc disease), cervical   . DDD (degenerative disc disease), lumbar   . Difficult intubation    2012   CERVICAL FUSION   . Headache(784.0)    VERAPAMIL FOR PREVENTION   . 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 History of chicken pox    as an adult  . Hypoglycemia    last episode 1 yr. ago, just gets jittery  . Hypothyroidism   . OSA (obstructive sleep apnea)    "haven't wore mask in years; had OR; still snore" (04/12/2013)  . Urinary frequency     History reviewed. No pertinent family history.  Past Surgical History:  Procedure Laterality Date  . BACK SURGERY    . CERVICAL FUSION  2002; 2012  . CYSTECTOMY    . INGUINAL HERNIA REPAIR Bilateral 1997  . KNEE ARTHROSCOPY Left ~ 1997; ~ 2005  . LUMBAR FUSION  2007; 04/11/2013  . PENILE DEBRIDEMENT  ~ 2011   X 3  FOR MRSA INFECTION    . SHOULDER ARTHROSCOPY Left ~ 2003  . TONSILLECTOMY  1990's  . TOTAL KNEE ARTHROPLASTY Right 07/23/2015  . TOTAL KNEE  ARTHROPLASTY Right 07/23/2015   Procedure: TOTAL KNEE ARTHROPLASTY;  Surgeon: Larry Balding, MD;  Location: [facility];  Service: Orthopedics;  Laterality: Right;  . UVULOPALATOPHARYNGOPLASTY (UPPP)/TONSILLECTOMY/SEPTOPLASTY  1990's  . VASECTOMY     Social History   Occupational History  . Not on file  Tobacco Use  . Smoking status: Former Smoker    Packs/day: 0.50    Years: 15.00    Pack years: 7.50    Types: Cigarettes    Last attempt to quit: 10/14/1986    Years since quitting: 31.1  . Smokeless tobacco: Never Used  Substance and Sexual Activity  . Alcohol use: No  . Drug use: No  . Sexual activity: Yes

## 2017-12-07 DIAGNOSIS — K120 Recurrent oral aphthae: Secondary | ICD-10-CM | POA: Diagnosis not present

## 2017-12-13 DIAGNOSIS — K148 Other diseases of tongue: Secondary | ICD-10-CM | POA: Diagnosis not present

## 2017-12-17 ENCOUNTER — Other Ambulatory Visit: Payer: Self-pay | Admitting: Neurological Surgery

## 2017-12-17 DIAGNOSIS — M48061 Spinal stenosis, lumbar region without neurogenic claudication: Secondary | ICD-10-CM

## 2017-12-25 ENCOUNTER — Ambulatory Visit
Admission: RE | Admit: 2017-12-25 | Discharge: 2017-12-25 | Disposition: A | Discharge status: Home / Self Care | Payer: Medicare Other | Source: Ambulatory Visit | Attending: Neurological Surgery | Admitting: Neurological Surgery

## 2017-12-25 DIAGNOSIS — M48061 Spinal stenosis, lumbar region without neurogenic claudication: Secondary | ICD-10-CM

## 2017-12-25 DIAGNOSIS — M545 Low back pain: Secondary | ICD-10-CM | POA: Diagnosis not present

## 2018-01-01 DIAGNOSIS — L738 Other specified follicular disorders: Secondary | ICD-10-CM | POA: Diagnosis not present

## 2018-01-04 DIAGNOSIS — K148 Other diseases of tongue: Secondary | ICD-10-CM | POA: Diagnosis not present

## 2018-01-12 ENCOUNTER — Other Ambulatory Visit: Payer: Self-pay | Admitting: Neurological Surgery

## 2018-01-14 NOTE — Pre-Procedure Instructions (Signed)
Larry [name]  01/14/2018      [id number] [place], [place] [address] [place] [address] Phone: [phone] Fax: [phone]    Your procedure is scheduled on  Monday  01/24/18  Report to [facility] Admitting at 1045 A.M.  Call this number if you have problems the morning of surgery:  [phone]   Remember:  Do not eat food or drink liquids after midnight.  Take these medicines the morning of surgery with A SIP OF WATER - VALIUM IF NEEDED, HYDROCODONE IF NEEDED, EYE DROPS , LEVOTHYROXINE, TAMSULOSIN (FLOMAX), VERAPAMIL (CALAN)  7 days prior to surgery STOP taking any Aspirin(unless otherwise instructed by your surgeon), Aleve, Naproxen, MELOXICAM / MOBIC,  Ibuprofen, Motrin, Advil, Goody's, BC's, all herbal medications, fish oil, and all vitamins   Do not wear jewelry, make-up or nail polish.  Do not wear lotions, powders, or perfumes, or deodorant.  Do not shave 48 hours prior to surgery.  Men may shave face and neck.  Do not bring valuables to the hospital.  [facility] is not responsible for any belongings or valuables.  Contacts, dentures or bridgework may not be worn into surgery.  Leave your suitcase in the car.  After surgery it may be brought to your room.  For patients admitted to the hospital, discharge time will be determined by your treatment team.  Patients discharged the day of surgery will not be allowed to drive home.   Name and phone number of your driver:    Special instructions:  [facility] - Preparing for Surgery  Before surgery, you can play an important role.  Because skin is not sterile, your skin needs to be as free of germs as possible.  You can reduce the number of germs on you skin by washing with CHG (chlorahexidine gluconate) soap before surgery.  CHG is an antiseptic cleaner which kills germs and bonds with the skin to continue killing germs even  after washing.  Please DO NOT use if you have an allergy to CHG or antibacterial soaps.  If your skin becomes reddened/irritated stop using the CHG and inform your nurse when you arrive at Short Stay.  Do not shave (including legs and underarms) for at least 48 hours prior to the first CHG shower.  You may shave your face.  Please follow these instructions carefully:   1.  Shower with CHG Soap the night before surgery and the                                morning of Surgery.  2.  If you choose to wash your hair, wash your hair first as usual with your       normal shampoo.  3.  After you shampoo, rinse your hair and body thoroughly to remove the                      Shampoo.  4.  Use CHG as you would any other liquid soap.  You can apply chg directly       to the skin and wash gently with scrungie or a clean washcloth.  5.  Apply the CHG Soap to your body ONLY FROM THE NECK DOWN.        Do not use on open wounds or open sores.  Avoid contact  with your eyes,       ears, mouth and genitals (private parts).  Wash genitals (private parts)       with your normal soap.  6.  Wash thoroughly, paying special attention to the area where your surgery        will be performed.  7.  Thoroughly rinse your body with warm water from the neck down.  8.  DO NOT shower/wash with your normal soap after using and rinsing off       the CHG Soap.  9.  Pat yourself dry with a clean towel.            10.  Wear clean pajamas.            11.  Place clean sheets on your bed the night of your first shower and do not        sleep with pets.  Day of Surgery  Do not apply any lotions/deoderants the morning of surgery.  Please wear clean clothes to the hospital/surgery center.     Please read over the following fact sheets that you were given. MRSA Information and Surgical Site Infection Prevention

## 2018-01-17 ENCOUNTER — Encounter (HOSPITAL_COMMUNITY)
Admission: RE | Admit: 2018-01-17 | Discharge: 2018-01-17 | Disposition: A | Discharge status: Home / Self Care | Payer: Medicare Other | Source: Ambulatory Visit | Attending: Neurological Surgery | Admitting: Neurological Surgery

## 2018-01-17 ENCOUNTER — Encounter (HOSPITAL_COMMUNITY): Payer: Self-pay

## 2018-01-17 ENCOUNTER — Other Ambulatory Visit: Payer: Self-pay

## 2018-01-17 DIAGNOSIS — Z01818 Encounter for other preprocedural examination: Secondary | ICD-10-CM | POA: Diagnosis not present

## 2018-01-17 HISTORY — DX: Spinal stenosis, lumbar region without neurogenic claudication: M48.061

## 2018-01-17 LAB — BASIC METABOLIC PANEL
Anion gap: 9 (ref 5–15)
BUN: 14 mg/dL (ref 6–20)
CO2: 30 mmol/L (ref 22–32)
Calcium: 9.1 mg/dL (ref 8.9–10.3)
Chloride: 103 mmol/L (ref 101–111)
Creatinine, Ser: 0.99 mg/dL (ref 0.61–1.24)
GFR calc Af Amer: >60 mL/min (ref >60)
GFR calc non Af Amer: >60 mL/min (ref >60)
Glucose, Bld: 144 mg/dL — ABNORMAL HIGH (ref 65–99)
Potassium: 4 mmol/L (ref 3.5–5.1)
Sodium: 142 mmol/L (ref 135–145)

## 2018-01-17 LAB — CBC
HCT: 50.7 % (ref 39.0–52.0)
Hemoglobin: 17.6 g/dL — ABNORMAL HIGH (ref 13.0–17.0)
MCH: 33.5 pg (ref 26.0–34.0)
MCHC: 34.7 g/dL (ref 30.0–36.0)
MCV: 96.4 fL (ref 78.0–100.0)
Platelets: 151 10*3/uL (ref 150–400)
RBC: 5.26 MIL/uL (ref 4.22–5.81)
RDW: 14 % (ref 11.5–15.5)
WBC: 5.4 10*3/uL (ref 4.0–10.5)

## 2018-01-17 LAB — SURGICAL PCR SCREEN
MRSA, PCR: NEGATIVE
Staphylococcus aureus: NEGATIVE

## 2018-01-17 NOTE — Progress Notes (Signed)
PCP - Dr. Leretha Dykes  Cardiologist - Denies  Chest x-ray - Denies  EKG - 03/19/17 (E)  Stress Test - Denies  ECHO - Denies  Cardiac Cath - Denies  Sleep Study - Yes- Positive CPAP - None- Corrected with surgery  LABS- 01/17/18: CBC, BMP, T/S  Anesthesia- No  Pt denies having chest pain, sob, or fever at this time. All instructions explained to the pt, with a verbal understanding of the material. Pt agrees to go over the instructions while at home for a better understanding. The opportunity to ask questions was provided.

## 2018-01-17 NOTE — Progress Notes (Signed)
Larry [name]            01/14/2018                          [id number] [place],  - [place] [address] [place] [address] Phone: [phone] Fax: [phone]              Your procedure is scheduled on  Monday  01/24/18            Report to [facility] Admitting at 1045 A.M.            Call this number if you have problems the morning of surgery:            [phone]             Remember:            Do not eat food or drink liquids after midnight.            Take these medicines the morning of surgery with A SIP OF WATER - VALIUM IF NEEDED, HYDROCODONE IF NEEDED, EYE DROPS , LEVOTHYROXINE, TAMSULOSIN (FLOMAX), VERAPAMIL (CALAN)  Follow your doctor's instruction regarding Aspirin and Xarelto  7 days prior to surgery STOP taking any Aspirin(unless otherwise instructed by your surgeon), Aleve, Naproxen, MELOXICAM / MOBIC,  Ibuprofen, Motrin, Advil, Goody's, BC's, all herbal medications, fish oil, and all vitamins             Do not wear jewelry.            Do not wear lotions, powders, colognes, or deodorant.            Do not shave 48 hours prior to surgery.  Men may shave face.            Do not bring valuables to the hospital.            [facility] is not responsible for any belongings or valuables.  Contacts, dentures or bridgework may not be worn into surgery.  Leave your suitcase in the car.  After surgery it may be brought to your room.  For patients admitted to the hospital, discharge time will be determined by your treatment team.  Patients discharged the day of surgery will not be allowed to drive home.   Name and phone number of your driver:    Special instructions:  [facility] - Preparing for Surgery  Before surgery, you can play an important role.  Because skin is not sterile, your skin needs to be as free of germs as possible.  You can reduce the number of  germs on you skin by washing with CHG (chlorahexidine gluconate) soap before surgery.  CHG is an antiseptic cleaner which kills germs and bonds with the skin to continue killing germs even after washing.  Please DO NOT use if you have an allergy to CHG or antibacterial soaps.  If your skin becomes reddened/irritated stop using the CHG and inform your nurse when you arrive at Short Stay.  Do not shave (including legs and underarms) for at least 48 hours prior to the first CHG shower.  You may shave your face.  Please follow these instructions carefully:             1.  Shower with CHG Soap the night before surgery and the  morning of Surgery.            2.  If you choose to wash your hair, wash your hair first as usual with your                normal shampoo.            3.  After you shampoo, rinse your hair and body thoroughly to remove the                             Shampoo.            4.  Use CHG as you would any other liquid soap.  You can apply chg directly               to the skin and wash gently with scrungie or a clean washcloth.            5.  Apply the CHG Soap to your body ONLY FROM THE NECK DOWN.          Do not use on open wounds or open sores.  Avoid contact with your eyes,             ears, mouth and genitals (private parts).  Wash genitals (private parts)        with your normal soap.            6.  Wash thoroughly, paying special attention to the area where your surgery               will be performed.            7.  Thoroughly rinse your body with warm water from the neck down.            8.  DO NOT shower/wash with your normal soap after using and rinsing off                the CHG Soap.            9.  Pat yourself dry with a clean towel.            10.  Wear clean pajamas.            11.  Place clean sheets on your bed the night of your first shower and do not           sleep with pets.  Day of Surgery  Do not apply any  lotions/deoderants the morning of surgery.  Please wear clean clothes to the hospital/surgery center.  Please read over the following fact sheets that you were given. MRSA Information and Surgical Site Infection Prevention

## 2018-01-24 ENCOUNTER — Inpatient Hospital Stay (HOSPITAL_COMMUNITY): Payer: Medicare Other | Admitting: Critical Care Medicine

## 2018-01-24 ENCOUNTER — Encounter (HOSPITAL_COMMUNITY)
Admission: RE | Disposition: A | Discharge status: Home / Self Care | Payer: Self-pay | Source: Ambulatory Visit | Attending: Neurological Surgery

## 2018-01-24 ENCOUNTER — Inpatient Hospital Stay (HOSPITAL_COMMUNITY)
Admission: RE | Admit: 2018-01-24 | Discharge: 2018-01-25 | DRG: 460 | Disposition: A | Discharge status: Home / Self Care | Payer: Medicare Other | Source: Ambulatory Visit | Attending: Neurological Surgery | Admitting: Neurological Surgery

## 2018-01-24 ENCOUNTER — Inpatient Hospital Stay (HOSPITAL_COMMUNITY): Payer: Medicare Other

## 2018-01-24 ENCOUNTER — Encounter (HOSPITAL_COMMUNITY): Payer: Self-pay | Admitting: Critical Care Medicine

## 2018-01-24 ENCOUNTER — Other Ambulatory Visit: Payer: Self-pay

## 2018-01-24 DIAGNOSIS — Z79890 Hormone replacement therapy (postmenopausal): Secondary | ICD-10-CM | POA: Diagnosis not present

## 2018-01-24 DIAGNOSIS — M1711 Unilateral primary osteoarthritis, right knee: Secondary | ICD-10-CM | POA: Diagnosis not present

## 2018-01-24 DIAGNOSIS — Z981 Arthrodesis status: Secondary | ICD-10-CM | POA: Diagnosis not present

## 2018-01-24 DIAGNOSIS — E039 Hypothyroidism, unspecified: Secondary | ICD-10-CM | POA: Diagnosis not present

## 2018-01-24 DIAGNOSIS — Z7901 Long term (current) use of anticoagulants: Secondary | ICD-10-CM | POA: Diagnosis not present

## 2018-01-24 DIAGNOSIS — G4733 Obstructive sleep apnea (adult) (pediatric): Secondary | ICD-10-CM | POA: Diagnosis not present

## 2018-01-24 DIAGNOSIS — M4726 Other spondylosis with radiculopathy, lumbar region: Principal | ICD-10-CM | POA: Diagnosis present

## 2018-01-24 DIAGNOSIS — Z7982 Long term (current) use of aspirin: Secondary | ICD-10-CM | POA: Diagnosis not present

## 2018-01-24 DIAGNOSIS — H9193 Unspecified hearing loss, bilateral: Secondary | ICD-10-CM | POA: Diagnosis present

## 2018-01-24 DIAGNOSIS — Z96651 Presence of right artificial knee joint: Secondary | ICD-10-CM | POA: Diagnosis present

## 2018-01-24 DIAGNOSIS — F419 Anxiety disorder, unspecified: Secondary | ICD-10-CM | POA: Diagnosis present

## 2018-01-24 DIAGNOSIS — M5416 Radiculopathy, lumbar region: Secondary | ICD-10-CM | POA: Diagnosis present

## 2018-01-24 DIAGNOSIS — Z87891 Personal history of nicotine dependence: Secondary | ICD-10-CM

## 2018-01-24 DIAGNOSIS — D62 Acute posthemorrhagic anemia: Secondary | ICD-10-CM | POA: Diagnosis not present

## 2018-01-24 DIAGNOSIS — Z419 Encounter for procedure for purposes other than remedying health state, unspecified: Secondary | ICD-10-CM

## 2018-01-24 DIAGNOSIS — M4807 Spinal stenosis, lumbosacral region: Secondary | ICD-10-CM | POA: Diagnosis not present

## 2018-01-24 DIAGNOSIS — M4727 Other spondylosis with radiculopathy, lumbosacral region: Secondary | ICD-10-CM | POA: Diagnosis not present

## 2018-01-24 DIAGNOSIS — M79604 Pain in right leg: Secondary | ICD-10-CM | POA: Diagnosis present

## 2018-01-24 LAB — PREPARE RBC (CROSSMATCH)

## 2018-01-24 SURGERY — POSTERIOR LUMBAR FUSION 1 LEVEL
Anesthesia: General | Site: Back

## 2018-01-24 MED ORDER — MORPHINE SULFATE (PF) 4 MG/ML IV SOLN
4.0000 mg | INTRAVENOUS | Status: DC | PRN
  Administered 2018-01-24 (×2): 4 mg via INTRAVENOUS
  Filled 2018-01-24 (×2): qty 1

## 2018-01-24 MED ORDER — THROMBIN 5000 UNITS EX SOLR
CUTANEOUS | Status: AC
  Filled 2018-01-24: qty 5000

## 2018-01-24 MED ORDER — CEFAZOLIN SODIUM-DEXTROSE 2-4 GM/100ML-% IV SOLN
2.0000 g | Freq: Three times a day (TID) | INTRAVENOUS | Status: AC
  Administered 2018-01-24 – 2018-01-25 (×2): 2 g via INTRAVENOUS
  Filled 2018-01-24 (×2): qty 100

## 2018-01-24 MED ORDER — LEVOTHYROXINE SODIUM 75 MCG PO TABS
150.0000 ug | ORAL_TABLET | Freq: Every day | ORAL | Status: DC
  Administered 2018-01-25: 150 ug via ORAL
  Filled 2018-01-24: qty 2

## 2018-01-24 MED ORDER — PHENYLEPHRINE HCL 10 MG/ML IJ SOLN
INTRAVENOUS | Status: DC | PRN
  Administered 2018-01-24 (×2): via INTRAVENOUS
  Administered 2018-01-24: 50 ug/min via INTRAVENOUS
  Administered 2018-01-24: 12:00:00 via INTRAVENOUS

## 2018-01-24 MED ORDER — LATANOPROST 0.005 % OP SOLN
1.0000 [drp] | Freq: Every day | OPHTHALMIC | Status: DC
  Filled 2018-01-24: qty 2.5

## 2018-01-24 MED ORDER — EPHEDRINE 5 MG/ML INJ
INTRAVENOUS | Status: AC
  Filled 2018-01-24: qty 10

## 2018-01-24 MED ORDER — THROMBIN 20000 UNITS EX SOLR
CUTANEOUS | Status: AC
  Filled 2018-01-24: qty 20000

## 2018-01-24 MED ORDER — DEXAMETHASONE SODIUM PHOSPHATE 10 MG/ML IJ SOLN
INTRAMUSCULAR | Status: DC | PRN
  Administered 2018-01-24: 4 mg via INTRAVENOUS

## 2018-01-24 MED ORDER — 0.9 % SODIUM CHLORIDE (POUR BTL) OPTIME
TOPICAL | Status: DC | PRN
  Administered 2018-01-24: 1000 mL

## 2018-01-24 MED ORDER — LIDOCAINE-EPINEPHRINE 1 %-1:100000 IJ SOLN
INTRAMUSCULAR | Status: AC
  Filled 2018-01-24: qty 1

## 2018-01-24 MED ORDER — MIDAZOLAM HCL 5 MG/5ML IJ SOLN
INTRAMUSCULAR | Status: DC | PRN
  Administered 2018-01-24: 2 mg via INTRAVENOUS

## 2018-01-24 MED ORDER — PHENOL 1.4 % MT LIQD: 1.0000 | OROMUCOSAL | Status: DC | PRN

## 2018-01-24 MED ORDER — TAMSULOSIN HCL 0.4 MG PO CAPS
0.4000 mg | ORAL_CAPSULE | Freq: Every day | ORAL | Status: DC
  Administered 2018-01-25: 0.4 mg via ORAL
  Filled 2018-01-24: qty 1

## 2018-01-24 MED ORDER — LACTATED RINGERS IV SOLN
INTRAVENOUS | Status: DC
  Administered 2018-01-24 (×4): via INTRAVENOUS

## 2018-01-24 MED ORDER — FUROSEMIDE 40 MG PO TABS: 40.0000 mg | ORAL_TABLET | Freq: Every day | ORAL | Status: DC | PRN

## 2018-01-24 MED ORDER — PROPOFOL 10 MG/ML IV BOLUS
INTRAVENOUS | Status: AC
  Filled 2018-01-24: qty 20

## 2018-01-24 MED ORDER — SUGAMMADEX SODIUM 200 MG/2ML IV SOLN
INTRAVENOUS | Status: DC | PRN
  Administered 2018-01-24: 200 mg via INTRAVENOUS

## 2018-01-24 MED ORDER — ONDANSETRON HCL 4 MG/2ML IJ SOLN: 4.0000 mg | Freq: Once | INTRAMUSCULAR | Status: DC | PRN

## 2018-01-24 MED ORDER — LIDOCAINE-EPINEPHRINE 1 %-1:100000 IJ SOLN
INTRAMUSCULAR | Status: DC | PRN
  Administered 2018-01-24: 5 mL via INTRADERMAL
  Administered 2018-01-24: 10 mL via INTRADERMAL

## 2018-01-24 MED ORDER — FENTANYL CITRATE (PF) 250 MCG/5ML IJ SOLN
INTRAMUSCULAR | Status: AC
  Filled 2018-01-24: qty 5

## 2018-01-24 MED ORDER — ARTIFICIAL TEARS OPHTHALMIC OINT
TOPICAL_OINTMENT | OPHTHALMIC | Status: DC | PRN
  Administered 2018-01-24: 1 via OPHTHALMIC

## 2018-01-24 MED ORDER — SUGAMMADEX SODIUM 200 MG/2ML IV SOLN
INTRAVENOUS | Status: AC
  Filled 2018-01-24: qty 4

## 2018-01-24 MED ORDER — SODIUM CHLORIDE 0.9% FLUSH: 3.0000 mL | INTRAVENOUS | Status: DC | PRN

## 2018-01-24 MED ORDER — POLYETHYLENE GLYCOL 3350 17 G PO PACK: 17.0000 g | PACK | Freq: Every day | ORAL | Status: DC | PRN

## 2018-01-24 MED ORDER — ROCURONIUM BROMIDE 10 MG/ML (PF) SYRINGE
PREFILLED_SYRINGE | INTRAVENOUS | Status: DC | PRN
  Administered 2018-01-24: 10 mg via INTRAVENOUS
  Administered 2018-01-24: 50 mg via INTRAVENOUS
  Administered 2018-01-24: 20 mg via INTRAVENOUS
  Administered 2018-01-24: 10 mg via INTRAVENOUS
  Administered 2018-01-24: 20 mg via INTRAVENOUS

## 2018-01-24 MED ORDER — SODIUM CHLORIDE 0.9 % IJ SOLN
INTRAMUSCULAR | Status: AC
  Filled 2018-01-24: qty 10

## 2018-01-24 MED ORDER — CEFAZOLIN SODIUM-DEXTROSE 2-4 GM/100ML-% IV SOLN
2.0000 g | INTRAVENOUS | Status: AC
  Administered 2018-01-24 (×2): 2 g via INTRAVENOUS
  Filled 2018-01-24: qty 100

## 2018-01-24 MED ORDER — NALOXONE HCL 0.4 MG/ML IJ SOLN
INTRAMUSCULAR | Status: AC
  Filled 2018-01-24: qty 1

## 2018-01-24 MED ORDER — METHOCARBAMOL 500 MG PO TABS
500.0000 mg | ORAL_TABLET | Freq: Four times a day (QID) | ORAL | Status: DC | PRN
  Administered 2018-01-24 – 2018-01-25 (×2): 500 mg via ORAL
  Filled 2018-01-24 (×2): qty 1

## 2018-01-24 MED ORDER — SENNA 8.6 MG PO TABS
1.0000 | ORAL_TABLET | Freq: Two times a day (BID) | ORAL | Status: DC
  Administered 2018-01-24 – 2018-01-25 (×2): 8.6 mg via ORAL
  Filled 2018-01-24 (×2): qty 1

## 2018-01-24 MED ORDER — CEFAZOLIN SODIUM 1 G IJ SOLR
INTRAMUSCULAR | Status: AC
  Filled 2018-01-24: qty 40

## 2018-01-24 MED ORDER — FLEET ENEMA 7-19 GM/118ML RE ENEM: 1.0000 | ENEMA | Freq: Once | RECTAL | Status: DC | PRN

## 2018-01-24 MED ORDER — MIDAZOLAM HCL 2 MG/2ML IJ SOLN
INTRAMUSCULAR | Status: AC
  Filled 2018-01-24: qty 2

## 2018-01-24 MED ORDER — DOCUSATE SODIUM 100 MG PO CAPS
100.0000 mg | ORAL_CAPSULE | Freq: Two times a day (BID) | ORAL | Status: DC
  Administered 2018-01-24 – 2018-01-25 (×2): 100 mg via ORAL
  Filled 2018-01-24 (×2): qty 1

## 2018-01-24 MED ORDER — HYDROMORPHONE HCL 2 MG/ML IJ SOLN
0.3000 mg | INTRAMUSCULAR | Status: DC | PRN
  Administered 2018-01-24 (×2): 0.5 mg via INTRAVENOUS

## 2018-01-24 MED ORDER — LORAZEPAM 1 MG PO TABS
1.0000 mg | ORAL_TABLET | Freq: Every day | ORAL | Status: DC
  Administered 2018-01-24: 1 mg via ORAL
  Filled 2018-01-24: qty 1

## 2018-01-24 MED ORDER — PHENYLEPHRINE 40 MCG/ML (10ML) SYRINGE FOR IV PUSH (FOR BLOOD PRESSURE SUPPORT)
PREFILLED_SYRINGE | INTRAVENOUS | Status: DC | PRN
  Administered 2018-01-24: 80 ug via INTRAVENOUS
  Administered 2018-01-24: 120 ug via INTRAVENOUS
  Administered 2018-01-24 (×2): 200 ug via INTRAVENOUS

## 2018-01-24 MED ORDER — SODIUM CHLORIDE 0.9% FLUSH
3.0000 mL | Freq: Two times a day (BID) | INTRAVENOUS | Status: DC
  Administered 2018-01-25: 3 mL via INTRAVENOUS

## 2018-01-24 MED ORDER — BUPIVACAINE HCL (PF) 0.5 % IJ SOLN
INTRAMUSCULAR | Status: DC | PRN
  Administered 2018-01-24: 10 mL
  Administered 2018-01-24: 5 mL

## 2018-01-24 MED ORDER — THROMBIN (RECOMBINANT) 5000 UNITS EX SOLR
OROMUCOSAL | Status: DC | PRN
  Administered 2018-01-24 (×2): via TOPICAL

## 2018-01-24 MED ORDER — BUPIVACAINE HCL (PF) 0.5 % IJ SOLN
INTRAMUSCULAR | Status: AC
Start: 2018-01-24 — End: 2018-01-24
  Filled 2018-01-24: qty 30

## 2018-01-24 MED ORDER — SODIUM CHLORIDE 0.9 % IV SOLN: 250.0000 mL | INTRAVENOUS | Status: DC

## 2018-01-24 MED ORDER — PROPOFOL 10 MG/ML IV BOLUS
INTRAVENOUS | Status: DC | PRN
  Administered 2018-01-24 (×2): 10 mg via INTRAVENOUS
  Administered 2018-01-24: 150 mg via INTRAVENOUS
  Administered 2018-01-24 (×2): 10 mg via INTRAVENOUS

## 2018-01-24 MED ORDER — OXYMETAZOLINE HCL 0.05 % NA SOLN
1.0000 | Freq: Two times a day (BID) | NASAL | Status: DC | PRN
  Filled 2018-01-24: qty 15

## 2018-01-24 MED ORDER — ALUM & MAG HYDROXIDE-SIMETH 200-200-20 MG/5ML PO SUSP: 30.0000 mL | Freq: Four times a day (QID) | ORAL | Status: DC | PRN

## 2018-01-24 MED ORDER — CHLORHEXIDINE GLUCONATE CLOTH 2 % EX PADS: 6.0000 | MEDICATED_PAD | Freq: Once | CUTANEOUS | Status: DC

## 2018-01-24 MED ORDER — ONDANSETRON HCL 4 MG/2ML IJ SOLN
INTRAMUSCULAR | Status: DC | PRN
  Administered 2018-01-24: 4 mg via INTRAVENOUS

## 2018-01-24 MED ORDER — VERAPAMIL HCL ER 180 MG PO TBCR
180.0000 mg | EXTENDED_RELEASE_TABLET | Freq: Every day | ORAL | Status: DC
Start: 2018-01-25 — End: 2018-01-25
  Administered 2018-01-25: 180 mg via ORAL
  Filled 2018-01-24: qty 1

## 2018-01-24 MED ORDER — LIDOCAINE 2% (20 MG/ML) 5 ML SYRINGE
INTRAMUSCULAR | Status: AC
  Filled 2018-01-24: qty 10

## 2018-01-24 MED ORDER — DEXAMETHASONE SODIUM PHOSPHATE 10 MG/ML IJ SOLN
INTRAMUSCULAR | Status: AC
  Filled 2018-01-24: qty 2

## 2018-01-24 MED ORDER — ARTIFICIAL TEARS OPHTHALMIC OINT
TOPICAL_OINTMENT | OPHTHALMIC | Status: AC
  Filled 2018-01-24: qty 10.5

## 2018-01-24 MED ORDER — LIDOCAINE 2% (20 MG/ML) 5 ML SYRINGE
INTRAMUSCULAR | Status: DC | PRN
  Administered 2018-01-24: 80 mg via INTRAVENOUS

## 2018-01-24 MED ORDER — ACETAMINOPHEN 325 MG PO TABS: 650.0000 mg | ORAL_TABLET | ORAL | Status: DC | PRN

## 2018-01-24 MED ORDER — DIAZEPAM 5 MG PO TABS
ORAL_TABLET | ORAL | Status: AC
  Filled 2018-01-24: qty 1

## 2018-01-24 MED ORDER — HYDROCODONE-ACETAMINOPHEN 5-325 MG PO TABS
1.0000 | ORAL_TABLET | ORAL | Status: DC | PRN
  Administered 2018-01-25 (×2): 2 via ORAL
  Filled 2018-01-24 (×2): qty 2

## 2018-01-24 MED ORDER — BACITRACIN 50000 UNITS IM SOLR
INTRAMUSCULAR | Status: DC | PRN
  Administered 2018-01-24: 12:00:00

## 2018-01-24 MED ORDER — OXYCODONE-ACETAMINOPHEN 5-325 MG PO TABS
1.0000 | ORAL_TABLET | ORAL | Status: DC | PRN
  Administered 2018-01-24: 2 via ORAL
  Filled 2018-01-24: qty 2

## 2018-01-24 MED ORDER — OXYCODONE-ACETAMINOPHEN 5-325 MG PO TABS
ORAL_TABLET | ORAL | Status: AC
  Filled 2018-01-24: qty 2

## 2018-01-24 MED ORDER — FENTANYL CITRATE (PF) 250 MCG/5ML IJ SOLN
INTRAMUSCULAR | Status: DC | PRN
  Administered 2018-01-24: 50 ug via INTRAVENOUS
  Administered 2018-01-24: 25 ug via INTRAVENOUS
  Administered 2018-01-24: 50 ug via INTRAVENOUS
  Administered 2018-01-24: 25 ug via INTRAVENOUS
  Administered 2018-01-24: 50 ug via INTRAVENOUS
  Administered 2018-01-24 (×2): 25 ug via INTRAVENOUS
  Administered 2018-01-24: 50 ug via INTRAVENOUS
  Administered 2018-01-24: 25 ug via INTRAVENOUS

## 2018-01-24 MED ORDER — HYDROMORPHONE HCL 1 MG/ML IJ SOLN
1.0000 mg | INTRAMUSCULAR | Status: DC | PRN
Start: 2018-01-24 — End: 2018-01-25
  Administered 2018-01-25 (×3): 1 mg via INTRAVENOUS
  Filled 2018-01-24 (×3): qty 1

## 2018-01-24 MED ORDER — MENTHOL 3 MG MT LOZG: 1.0000 | LOZENGE | OROMUCOSAL | Status: DC | PRN

## 2018-01-24 MED ORDER — LACTATED RINGERS IV SOLN
INTRAVENOUS | Status: DC
  Administered 2018-01-24 – 2018-01-25 (×2): via INTRAVENOUS

## 2018-01-24 MED ORDER — HYDROMORPHONE HCL 2 MG/ML IJ SOLN
INTRAMUSCULAR | Status: AC
  Filled 2018-01-24: qty 1

## 2018-01-24 MED ORDER — ONDANSETRON HCL 4 MG PO TABS: 4.0000 mg | ORAL_TABLET | Freq: Four times a day (QID) | ORAL | Status: DC | PRN

## 2018-01-24 MED ORDER — ONDANSETRON HCL 4 MG/2ML IJ SOLN
INTRAMUSCULAR | Status: AC
  Filled 2018-01-24: qty 4

## 2018-01-24 MED ORDER — BISACODYL 10 MG RE SUPP: 10.0000 mg | Freq: Every day | RECTAL | Status: DC | PRN

## 2018-01-24 MED ORDER — DEXTROSE 5 % IV SOLN
500.0000 mg | Freq: Four times a day (QID) | INTRAVENOUS | Status: DC | PRN
  Filled 2018-01-24: qty 5

## 2018-01-24 MED ORDER — ONDANSETRON HCL 4 MG/2ML IJ SOLN: 4.0000 mg | Freq: Four times a day (QID) | INTRAMUSCULAR | Status: DC | PRN

## 2018-01-24 MED ORDER — ROCURONIUM BROMIDE 10 MG/ML (PF) SYRINGE
PREFILLED_SYRINGE | INTRAVENOUS | Status: AC
  Filled 2018-01-24: qty 10

## 2018-01-24 MED ORDER — EPHEDRINE SULFATE-NACL 50-0.9 MG/10ML-% IV SOSY
PREFILLED_SYRINGE | INTRAVENOUS | Status: DC | PRN
  Administered 2018-01-24 (×8): 5 mg via INTRAVENOUS

## 2018-01-24 MED ORDER — ALBUMIN HUMAN 5 % IV SOLN
INTRAVENOUS | Status: DC | PRN
  Administered 2018-01-24 (×2): via INTRAVENOUS

## 2018-01-24 MED ORDER — ACETAMINOPHEN 650 MG RE SUPP: 650.0000 mg | RECTAL | Status: DC | PRN

## 2018-01-24 MED ORDER — DIAZEPAM 5 MG PO TABS
5.0000 mg | ORAL_TABLET | Freq: Four times a day (QID) | ORAL | Status: DC | PRN
  Administered 2018-01-24 – 2018-01-25 (×2): 5 mg via ORAL
  Filled 2018-01-24: qty 1

## 2018-01-24 SURGICAL SUPPLY — 74 items
ADH SKN CLS APL DERMABOND .7 (GAUZE/BANDAGES/DRESSINGS) ×1
APL SRG 60D 8 XTD TIP BNDBL (TIP)
BAG DECANTER FOR FLEXI CONT (MISCELLANEOUS) ×2 IMPLANT
BASKET BONE COLLECTION (BASKET) ×2 IMPLANT
BLADE CLIPPER SURG (BLADE) ×1 IMPLANT
BONE CANC CHIPS 20CC PCAN1/4 (Bone Implant) ×2 IMPLANT
BUR MATCHSTICK NEURO 3.0 LAGG (BURR) ×2 IMPLANT
CAGE COROENT MP 8X23 (Cage) ×2 IMPLANT
CANISTER SUCT 3000ML PPV (MISCELLANEOUS) ×2 IMPLANT
CHIPS CANC BONE 20CC PCAN1/4 (Bone Implant) ×1 IMPLANT
CONNECTOR RELINE 20MM OPEN OFF (Connector) ×2 IMPLANT
CONNECTOR RELINE 30MM OPEN OFF (Connector) ×2 IMPLANT
CONNECTOR RELINE ROTATING 5-6 (Connector) ×2 IMPLANT
CONT SPEC 4OZ CLIKSEAL STRL BL (MISCELLANEOUS) ×2 IMPLANT
COVER BACK TABLE 60X90IN (DRAPES) ×2 IMPLANT
DECANTER SPIKE VIAL GLASS SM (MISCELLANEOUS) ×2 IMPLANT
DERMABOND ADVANCED (GAUZE/BANDAGES/DRESSINGS) ×1
DERMABOND ADVANCED .7 DNX12 (GAUZE/BANDAGES/DRESSINGS) ×1 IMPLANT
DEVICE DISSECT PLASMABLAD 3.0S (MISCELLANEOUS) ×1 IMPLANT
DRAPE C-ARM 42X72 X-RAY (DRAPES) ×4 IMPLANT
DRAPE HALF SHEET 40X57 (DRAPES) IMPLANT
DRAPE LAPAROTOMY 100X72X124 (DRAPES) ×2 IMPLANT
DRSG OPSITE POSTOP 4X8 (GAUZE/BANDAGES/DRESSINGS) ×2 IMPLANT
DURAPREP 26ML APPLICATOR (WOUND CARE) ×2 IMPLANT
DURASEAL APPLICATOR TIP (TIP) IMPLANT
DURASEAL SPINE SEALANT 3ML (MISCELLANEOUS) IMPLANT
ELECT REM PT RETURN 9FT ADLT (ELECTROSURGICAL) ×2
ELECTRODE REM PT RTRN 9FT ADLT (ELECTROSURGICAL) ×1 IMPLANT
GAUZE SPONGE 4X4 12PLY STRL (GAUZE/BANDAGES/DRESSINGS) ×2 IMPLANT
GAUZE SPONGE 4X4 16PLY XRAY LF (GAUZE/BANDAGES/DRESSINGS) ×1 IMPLANT
GLOVE BIOGEL PI IND STRL 7.0 (GLOVE) IMPLANT
GLOVE BIOGEL PI IND STRL 7.5 (GLOVE) IMPLANT
GLOVE BIOGEL PI IND STRL 8.5 (GLOVE) ×2 IMPLANT
GLOVE BIOGEL PI INDICATOR 7.0 (GLOVE) ×1
GLOVE BIOGEL PI INDICATOR 7.5 (GLOVE) ×2
GLOVE BIOGEL PI INDICATOR 8.5 (GLOVE) ×2
GLOVE ECLIPSE 8.5 STRL (GLOVE) ×4 IMPLANT
GLOVE SS BIOGEL STRL SZ 7 (GLOVE) IMPLANT
GLOVE SUPERSENSE BIOGEL SZ 7 (GLOVE) ×1
GOWN STRL REUS W/ TWL LRG LVL3 (GOWN DISPOSABLE) IMPLANT
GOWN STRL REUS W/ TWL XL LVL3 (GOWN DISPOSABLE) IMPLANT
GOWN STRL REUS W/TWL 2XL LVL3 (GOWN DISPOSABLE) ×4 IMPLANT
GOWN STRL REUS W/TWL LRG LVL3 (GOWN DISPOSABLE) ×8
GOWN STRL REUS W/TWL XL LVL3 (GOWN DISPOSABLE)
GRAFT BNE CANC CHIPS 1-8 20CC (Bone Implant) IMPLANT
HEMOSTAT POWDER KIT SURGIFOAM (HEMOSTASIS) ×1 IMPLANT
KIT BASIN OR (CUSTOM PROCEDURE TRAY) ×2 IMPLANT
KIT INFUSE SMALL (Orthopedic Implant) ×1 IMPLANT
KIT TURNOVER KIT B (KITS) ×2 IMPLANT
MILL MEDIUM DISP (BLADE) ×2 IMPLANT
NEEDLE HYPO 22GX1.5 SAFETY (NEEDLE) ×2 IMPLANT
NS IRRIG 1000ML POUR BTL (IV SOLUTION) ×2 IMPLANT
PACK LAMINECTOMY NEURO (CUSTOM PROCEDURE TRAY) ×2 IMPLANT
PAD ARMBOARD 7.5X6 YLW CONV (MISCELLANEOUS) ×6 IMPLANT
PATTIES SURGICAL .5 X1 (DISPOSABLE) ×2 IMPLANT
PLASMABLADE 3.0S (MISCELLANEOUS) ×2
ROD RELIN-O LORD 5.5X65MM (Rod) ×1 IMPLANT
ROD RELINE TI LORD 5.5X70 (Rod) ×1 IMPLANT
SCREW LOCK RELINE 5.5 TULIP (Screw) ×12 IMPLANT
SCREW RELINE-O POLY 6.5X45 (Screw) ×2 IMPLANT
SCREW RELINE-O POLY 8.5C70MM (Screw) ×2 IMPLANT
SPONGE LAP 18X18 X RAY DECT (DISPOSABLE) ×1 IMPLANT
SPONGE LAP 4X18 X RAY DECT (DISPOSABLE) ×2 IMPLANT
SPONGE SURGIFOAM ABS GEL 100 (HEMOSTASIS) ×2 IMPLANT
SUT PROLENE 6 0 BV (SUTURE) IMPLANT
SUT VIC AB 1 CT1 18XBRD ANBCTR (SUTURE) ×1 IMPLANT
SUT VIC AB 1 CT1 8-18 (SUTURE) ×2
SUT VIC AB 2-0 CP2 18 (SUTURE) ×2 IMPLANT
SUT VIC AB 3-0 SH 8-18 (SUTURE) ×3 IMPLANT
SYR 3ML LL SCALE MARK (SYRINGE) ×8 IMPLANT
TOWEL GREEN STERILE (TOWEL DISPOSABLE) ×2 IMPLANT
TOWEL GREEN STERILE FF (TOWEL DISPOSABLE) ×2 IMPLANT
TRAY FOLEY W/METER SILVER 16FR (SET/KITS/TRAYS/PACK) ×2 IMPLANT
WATER STERILE IRR 1000ML POUR (IV SOLUTION) ×2 IMPLANT

## 2018-01-24 NOTE — Anesthesia Procedure Notes (Signed)
Procedure Name: Intubation Date/Time: 01/24/2018 11:08 AM Performed by: Wilburn Cornelia, CRNA Pre-anesthesia Checklist: Patient identified, Emergency Drugs available, Suction available, Patient being monitored and Timeout performed Patient Re-evaluated:Patient Re-evaluated prior to induction Oxygen Delivery Method: Circle system utilized Preoxygenation: Pre-oxygenation with 100% oxygen Induction Type: IV induction Ventilation: Mask ventilation without difficulty and Oral airway inserted - appropriate to patient size Grade View: Grade I Tube type: Oral Tube size: 7.5 mm Number of attempts: 1 Airway Equipment and Method: Stylet and Video-laryngoscopy Placement Confirmation: ETT inserted through vocal cords under direct vision,  positive ETCO2,  CO2 detector and breath sounds checked- equal and bilateral Secured at: 22 cm Tube secured with: Tape Dental Injury: Teeth and Oropharynx as per pre-operative assessment

## 2018-01-24 NOTE — Progress Notes (Signed)
Patient ID: Larry [name], male   DOB: Mar 31, 1951, 67 y.o.   MRN: [id number] Vital signs are stable Patient is alert oriented moving all extremities well Tolerated surgery well Mobilization tomorrow

## 2018-01-24 NOTE — H&P (Signed)
Larry [name] is an 67 y.o. male.   Chief Complaint: Back and bilateral leg pain right worse than left HPI: Patient is a 67 year old individual who has had significant problems with back and right lower extremity pain.  He has had a previous decompression fusion from L3-L5 with posterior interbody fixation and pedicle screw fixation from L3-L5.  Now has degenerative changes at L5-S1 and has had a previous decompression.  He continues to have significant radicular pain he is been advised regarding the need for formal decompression with posterior interbody arthrodesis and fixation to the ileum.  He is now being admitted for that procedure.  Past Medical History:  Diagnosis Date  . Arthritis    "knees; left shoulder" (04/12/2013)  . Asthma    "as a child" (04/12/2013)  . DDD (degenerative disc disease), cervical   . DDD (degenerative disc disease), lumbar   . Difficult intubation    2012   CERVICAL FUSION   . Headache(784.0)    VERAPAMIL FOR PREVENTION   . 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 History of chicken pox    as an adult  . Hypoglycemia    last episode 1 yr. ago, just gets jittery  . Hypothyroidism   . Lumbar stenosis   . OSA (obstructive sleep apnea)    "haven't wore mask in years; had OR; still snore" (04/12/2013)  . Urinary frequency     Past Surgical History:  Procedure Laterality Date  . BACK SURGERY    . CERVICAL FUSION  2002; 2012  . CYSTECTOMY    . INGUINAL HERNIA REPAIR Bilateral 1997  . KNEE ARTHROSCOPY Left ~ 1997; ~ 2005  . LUMBAR FUSION  2007; 04/11/2013  . PENILE DEBRIDEMENT  ~ 2011   X 3  FOR MRSA INFECTION    . SHOULDER ARTHROSCOPY Left ~ 2003  . TONSILLECTOMY  1990's  . TOTAL KNEE ARTHROPLASTY Right 07/23/2015  . TOTAL KNEE ARTHROPLASTY Right 07/23/2015   Procedure: TOTAL KNEE ARTHROPLASTY;  Surgeon: Garald Balding, MD;  Location: [facility];  Service:  Orthopedics;  Laterality: Right;  . UVULOPALATOPHARYNGOPLASTY (UPPP)/TONSILLECTOMY/SEPTOPLASTY  1990's  . VASECTOMY      No family history on file. Social History:  reports that he quit smoking about 31 years ago. His smoking use included cigarettes. He has a 7.50 pack-year smoking history. He has never used smokeless tobacco. He reports that he does not drink alcohol or use drugs.  Allergies: No Known Allergies  No medications prior to admission.    No results found for this or any previous visit (from the past 48 hour(s)). No results found.  Review of Systems  Constitutional: Negative.   HENT: Negative.   Eyes: Negative.   Respiratory: Negative.   Cardiovascular: Negative.   Gastrointestinal: Negative.   Genitourinary: Negative.   Musculoskeletal: Positive for back pain.  Skin: Negative.   Neurological:       Bilateral radicular pain in the gluteal region with radiation in the posterior thigh  Endo/Heme/Allergies: Negative.   Psychiatric/Behavioral: Negative.     There were no vitals taken for this visit. Physical Exam  Constitutional: He is oriented to person, place, and time. He appears well-developed and well-nourished.  HENT:  Head: Normocephalic and atraumatic.  Eyes: Pupils are equal, round, and reactive to light. Conjunctivae and EOM are normal.  Neck: Normal range of motion. Neck supple.  Cardiovascular: Normal rate  and regular rhythm.  Respiratory: Effort normal and breath sounds normal.  GI: Soft. Bowel sounds are normal.  Musculoskeletal:  Positive straight leg raising at 45 degrees on the right side 60 degrees on the left.  Patrick's maneuver is negative bilaterally.  Midline scar is well-healed in the lumbar spine.  Neurological: He is alert and oriented to person, place, and time.  Mild weakness in the gluteal muscles bilaterally.  Right appears to be worse than the left.  Absent Achilles reflexes 1+ patellar reflexes.  Station and gait are intact though  wide-based.  Cranial nerve examination is intact.  Skin: Skin is warm and dry.  Psychiatric: He has a normal mood and affect. His behavior is normal. Judgment and thought content normal.     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  History of decompression and fusion L3-L5.  Plan decompression L5-S1 with posterior interbody arthrodesis fixation from L3 to the ileum.  Larry Newport, MD 01/24/2018, 7:23 AM

## 2018-01-24 NOTE — Op Note (Signed)
Date of surgery: 01/24/2018 Preoperative diagnosis: Spondylosis and stenosis with radiculopathy L5-S1.  History of lumbar fusion L2-L5. Postoperative diagnosis: Same Procedure: Decompression of L5-S1 with bilateral laminotomies, more work than required for simple interbody technique.  Posterior lumbar interbody arthrodesis with peek spacers local autograft and allograft and infuse.  Pedicle screw fixation to L5 and the ilium from previous hardware including fusion from L2-L5.  Posterior lateral arthrodesis with local autograft and allograft. Surgeon: Kristeen Miss First assistant: None Anesthesia: General endotracheal Indications: Larry [name] is a 67 year old individual has had significant back and bilateral lower extremity pain worse on the right.  She had a previous decompression at L5-S1 but has had extensive decompression fusion from L2-L5.  He is advised regarding the need for surgical decompression and stabilization at L5-S1 which would include placing hardware into the ileum.  Procedure: The patient was brought to the operating room supine on stretcher.  After the smooth induction of general tracheal anesthesia he was turned prone.  The back was prepped with alcohol DuraPrep and draped in sterile fashion.  Midline incision was created and carried down to the lumbodorsal fascia which was opened on either side of midline to expose the inferior portion of the old hardware.  Then L5-S1 was isolated.  Bilateral laminotomies were created and significant scar tissue was encountered on the left side where he has had a previous laminotomy decompression.  The dura was carefully identified and decompressed in the lateral recess to decompress both the L5 nerve root and the S1 nerve root inferiorly.  The disc space itself was noted to be very sclerotic.  It was gradually entered.  Then the opposite side was decompressed with a similar wide laminotomy care was taken to decompress the common dural tube and the L5  nerve root superiorly and the S1 nerve root inferiorly.  The disc space here was also identified and gradually discectomy was performed to remove the remainders of the degenerated disc at the L5-S1 space.  The endplates were then curettaged to allow good bony coaptation after the interspace was sized for an appropriate size spacer.  It was felt that an 8 mm 4 degree lordotic spacer measuring 23 mm in length was placed into the interspace was packed with autograft allograft and infuse.  Then a total of 6 cc of bone graft was placed into the interspace at L5-S1.  Lateral gutters were decorticated and additional 6 cc of bone graft was placed into each lateral gutter between the transverse process of L5 and the ala.  Then pedicle entry sites were chosen and S1.  6.5 x 45 mm screws were placed into S1.  The ileum was then identified and isolated laterally.  This was done through a separate subfascial incision.  The superior crest was taken down and a blunt probe was placed into the ileum ultimately an 8.5 x 70 mm screw was placed into the ilium first on the right side than on the left.  Radiographic confirmation of the placement of the screws was obtained.  Then by using side connectors a rod that was precontoured was placed between the ileum S1 and a side car connector that was placed between the L4 and L5 screws.  The rods were placed to conform into the seats of the connectors and ultimately a neutral construct was tightened into position.  Final radiographs were obtained in AP and lateral projection.  With this hemostasis in the soft tissues was obtained meticulously and then the lumbodorsal fascia was closed with #1  Vicryl in interrupted fashion 2-0 Vicryl was used in the subtenons tissues and 3-0 Vicryl subcuticularly.  Blood loss is estimated at 1300 mL.  Patient was returned to recovery room in stable condition.

## 2018-01-24 NOTE — Anesthesia Preprocedure Evaluation (Signed)
Anesthesia Evaluation  Patient identified by MRN, date of birth, ID band Patient awake    Reviewed: Allergy & Precautions, NPO status , Patient's Chart, lab work & pertinent test results  History of Anesthesia Complications (+) DIFFICULT AIRWAY  Airway Mallampati: IV  TM Distance: <3 FB Neck ROM: Limited  Mouth opening: Limited Mouth Opening  Dental  (+) Caps, Teeth Intact, Dental Advisory Given   Pulmonary neg pulmonary ROS, sleep apnea , former smoker,    breath sounds clear to auscultation       Cardiovascular negative cardio ROS   Rhythm:Regular Rate:Normal     Neuro/Psych  Headaches, Anxiety    GI/Hepatic negative GI ROS, Neg liver ROS,   Endo/Other  Hypothyroidism   Renal/GU negative Renal ROS  negative genitourinary   Musculoskeletal  (+) Arthritis , Osteoarthritis,    Abdominal Normal abdominal exam  (+)   Bowel sounds: normal.  Peds negative pediatric ROS (+)  Hematology negative hematology ROS (+)   Anesthesia Other Findings   Reproductive/Obstetrics negative OB ROS                             Lab Results  Component Value Date   WBC 5.4 01/17/2018   HGB 17.6 (H) 01/17/2018   HCT 50.7 01/17/2018   MCV 96.4 01/17/2018   PLT 151 01/17/2018   Lab Results  Component Value Date   CREATININE 0.99 01/17/2018   BUN 14 01/17/2018   NA 142 01/17/2018   K 4.0 01/17/2018   CL 103 01/17/2018   CO2 30 01/17/2018    Anesthesia Physical  Anesthesia Plan  ASA: II  Anesthesia Plan: General   Post-op Pain Management:    Induction: Intravenous  PONV Risk Score and Plan: 2 and Ondansetron, Dexamethasone and Treatment may vary due to age or medical condition  Airway Management Planned: Video Laryngoscope Planned and Oral ETT  Additional Equipment:   Intra-op Plan:   Post-operative Plan: Extubation in OR  Informed Consent: I have reviewed the patients History and  Physical, chart, labs and discussed the procedure including the risks, benefits and alternatives for the proposed anesthesia with the patient or authorized representative who has indicated his/her understanding and acceptance.   Dental advisory given and Consent reviewed with POA  Plan Discussed with: Surgeon, Anesthesiologist and CRNA  Anesthesia Plan Comments:         Anesthesia Quick Evaluation

## 2018-01-24 NOTE — Transfer of Care (Signed)
Immediate Anesthesia Transfer of Care Note  Patient: Larry [name]  Procedure(s) Performed: POSTERIOR LUMBAR INTERBODY FUSION LUMBAR FIVE-SACRAL ONE WITH REVISION OF HARDWARE LUMBAR THREE TO ILIUM (N/A Back)  Patient Location: PACU  Anesthesia Type:General  Level of Consciousness: awake, alert  and oriented  Airway & Oxygen Therapy: Patient Spontanous Breathing and Patient connected to nasal cannula oxygen  Post-op Assessment: Report given to RN, Post -op Vital signs reviewed and stable and Patient moving all extremities X 4  Post vital signs: Reviewed and stable  Last Vitals:  Vitals Value Taken Time  BP    Temp    Pulse 92 01/24/2018  4:25 PM  Resp 14 01/24/2018  4:25 PM  SpO2 99 % 01/24/2018  4:25 PM  Vitals shown include unvalidated device data.  Last Pain:  Vitals:   01/24/18 0936  TempSrc:   PainSc: 0-No pain      Patients Stated Pain Goal: 3 (23/34/35 6861)  Complications: No apparent anesthesia complications

## 2018-01-25 ENCOUNTER — Other Ambulatory Visit: Payer: Self-pay

## 2018-01-25 LAB — BASIC METABOLIC PANEL
Anion gap: 8 (ref 5–15)
BUN: 10 mg/dL (ref 6–20)
CO2: 29 mmol/L (ref 22–32)
Calcium: 7.8 mg/dL — ABNORMAL LOW (ref 8.9–10.3)
Chloride: 101 mmol/L (ref 101–111)
Creatinine, Ser: 0.92 mg/dL (ref 0.61–1.24)
GFR calc Af Amer: >60 mL/min (ref >60)
GFR calc non Af Amer: >60 mL/min (ref >60)
Glucose, Bld: 128 mg/dL — ABNORMAL HIGH (ref 65–99)
Potassium: 4.1 mmol/L (ref 3.5–5.1)
Sodium: 138 mmol/L (ref 135–145)

## 2018-01-25 LAB — CBC
HCT: 34.3 % — ABNORMAL LOW (ref 39.0–52.0)
Hemoglobin: 11.5 g/dL — ABNORMAL LOW (ref 13.0–17.0)
MCH: 32.7 pg (ref 26.0–34.0)
MCHC: 33.5 g/dL (ref 30.0–36.0)
MCV: 97.4 fL (ref 78.0–100.0)
Platelets: 152 10*3/uL (ref 150–400)
RBC: 3.52 MIL/uL — ABNORMAL LOW (ref 4.22–5.81)
RDW: 14.3 % (ref 11.5–15.5)
WBC: 9.4 10*3/uL (ref 4.0–10.5)

## 2018-01-25 LAB — POCT I-STAT 4, (NA,K, GLUC, HGB,HCT)
Glucose, Bld: 146 mg/dL — ABNORMAL HIGH (ref 65–99)
HCT: 40 % (ref 39.0–52.0)
Hemoglobin: 13.6 g/dL (ref 13.0–17.0)
Potassium: 3.7 mmol/L (ref 3.5–5.1)
Sodium: 140 mmol/L (ref 135–145)

## 2018-01-25 LAB — MRSA PCR SCREENING: MRSA by PCR: NEGATIVE

## 2018-01-25 MED ORDER — DIAZEPAM 5 MG PO TABS: 5.0000 mg | ORAL_TABLET | Freq: Four times a day (QID) | ORAL | 0 refills | Status: DC | PRN

## 2018-01-25 MED ORDER — HYDROMORPHONE HCL 2 MG PO TABS: 2.0000 mg | ORAL_TABLET | ORAL | 0 refills | Status: DC | PRN

## 2018-01-25 MED ORDER — PNEUMOCOCCAL VAC POLYVALENT 25 MCG/0.5ML IJ INJ: 0.5000 mL | INJECTION | INTRAMUSCULAR | Status: DC

## 2018-01-25 MED ORDER — HYDROMORPHONE HCL 2 MG PO TABS: 2.0000 mg | ORAL_TABLET | ORAL | Status: DC | PRN

## 2018-01-25 MED FILL — Thrombin For Soln 5000 Unit: CUTANEOUS | Qty: 5000 | Status: AC

## 2018-01-25 MED FILL — Gelatin Absorbable MT Powder: OROMUCOSAL | Qty: 1 | Status: AC

## 2018-01-25 NOTE — Discharge Summary (Signed)
Physician Discharge Summary  Patient ID: Larry [name] MRN: [id number] DOB/AGE: 1950/12/28 67 y.o.  Admit date: 01/24/2018 Discharge date: 01/25/2018  Admission Diagnoses: Spondylosis and stenosis L5-S1 with radiculopathy.  History of fusion L 3 -L5.  Discharge Diagnoses: Spondylosis and stenosis L5-S1 with radiculopathy.  History of fusion L3-L5.  Acute blood loss anemia Active Problems:   Lumbar radiculopathy, chronic   Discharged Condition: good  Hospital Course: Patient was admitted to undergo surgical decompression and stabilization at L5-S1.  Tolerated surgery well.  Separative hemoglobin is 11.2.  Patient had acute blood loss anemia  Consults: None  Significant Diagnostic Studies: None  Treatments: surgery: Decompression and fusion L5-S1 with posterior interbody arthrodesis L5-S1.  Discharge Exam: Blood pressure 124/70, pulse 84, temperature 98.3 F (36.8 C), temperature source Oral, resp. rate 20, height _0  (1.676 m), weight 87.5 kg (193 lb), SpO2 98 %. Station and gait are intact incision is clean and dry.  Disposition: Discharge disposition: 01-Home or Self Care       Discharge Instructions    Call MD for:  redness, tenderness, or signs of infection (pain, swelling, redness, odor or green/yellow discharge around incision site)   Complete by:  As directed    Call MD for:  severe uncontrolled pain   Complete by:  As directed    Call MD for:  temperature >100.4   Complete by:  As directed    Diet - low sodium heart healthy   Complete by:  As directed    Discharge instructions   Complete by:  As directed    Okay to shower. Do not apply salves or appointments to incision. No heavy lifting with the upper extremities greater than 15 pounds. May resume driving when not requiring pain medication and patient feels comfortable with doing so.   Incentive spirometry RT   Complete by:  As directed    Increase activity slowly   Complete by:  As directed       Allergies as of 01/25/2018   No Known Allergies     Medication List    STOP taking these medications   meloxicam 15 MG tablet Commonly known as:  MOBIC     TAKE these medications   AFRIN NASAL SPRAY NA Place 2 sprays into both nostrils 2 (two) times daily as needed (nasal congestion).   aspirin EC 81 MG tablet Take 81 mg by mouth daily.   diazepam 5 MG tablet Commonly known as:  VALIUM Take 1 tablet (5 mg total) by mouth every 6 (six) hours as needed for muscle spasms.   furosemide 40 MG tablet Commonly known as:  LASIX Take 40 mg by mouth daily as needed for edema.   HYDROcodone-acetaminophen 5-325 MG tablet Commonly known as:  NORCO/VICODIN Take 1 tablet by mouth every 6 (six) hours as needed for pain.   HYDROmorphone 2 MG tablet Commonly known as:  DILAUDID Take 1 tablet (2 mg total) by mouth every 4 (four) hours as needed for severe pain.   latanoprost 0.005 % ophthalmic solution Commonly known as:  XALATAN Place 1 drop into both eyes at bedtime.   levothyroxine 150 MCG tablet Commonly known as:  SYNTHROID, LEVOTHROID Take 150 mcg by mouth daily before breakfast.   LORazepam 0.5 MG tablet Commonly known as:  ATIVAN Take 1 mg by mouth at bedtime.   MULTIVITAMIN PO Take 1 tablet by mouth daily.   rivaroxaban 10 MG Tabs tablet Commonly known as:  XARELTO Take 1 tablet (10 mg total) by  mouth daily with breakfast.   tamsulosin 0.4 MG Caps capsule Commonly known as:  FLOMAX Take 0.4 mg by mouth daily.   Testosterone Cypionate 200 MG/ML Kit Inject 150 mg into the muscle every 14 (fourteen) days.   VAYACOG 100-19.5-6.5 MG Caps Generic drug:  Phosphatidylserine-DHA-EPA Take 1 capsule by mouth daily.   verapamil 180 MG CR tablet Commonly known as:  CALAN-SR Take 180 mg by mouth daily.   VIAGRA PO Take 30 mg by mouth daily as needed (for erectile dysfunction). Purchased from [email or web address]--dose indicated on website as 30 mg         Signed: Earleen Newport 01/25/2018, 4:52 PM

## 2018-01-25 NOTE — Progress Notes (Signed)
Patient ID: Larry [name], male   DOB: [birth year], 67 y.o.   MRN: [id number] Vital signs stable Postoperative hemoglobin is 11.2 [name] patient notes that he does better with Dilaudid than Percocet as Percocet makes him feel agitated.  We will change his pain meds to Dilaudid Patient has been ambulatory Plan discharge in a.m.

## 2018-01-25 NOTE — Evaluation (Signed)
Occupational Therapy Evaluation Patient Details Name: Larry [name] MRN: [id number] DOB: Jul 14, 1951 Today's Date: 01/25/2018    History of Present Illness 67 yo male s/p PLIF L5-S1 WITH REVISION OF HARDWARE LUMBAR THREE TO ILIUM   Clinical Impression   Patient is s/p PLIF L5-S1 surgery resulting in functional limitations due to the deficits listed below (see OT problem list). Pt currently with bil LE weakness and inability to lift L LE. Pt reports weakness BIL LE compared to baseline. Pt reports "I have counted these two inch ceiling tiles and know how far I have to go to get my mile in" Pt educated to avoid walking a mile at one time due to fatigue and increased fall risk. Pt advised to walk short distances.  Patient will benefit from skilled OT acutely to increase independence and safety with ADLS to allow discharge home.     Follow Up Recommendations  No OT follow up    Equipment Recommendations  None recommended by OT    Recommendations for Other Services PT consult(outpatient balance)     Precautions / Restrictions Precautions Precautions: Back Precaution Comments: handout provided and reviewed in detail for adls. pt with x5 back surgeries at this time. pt familiar with back brace Required Braces or Orthoses: Spinal Brace Spinal Brace: Lumbar corset;Applied in sitting position      Mobility Bed Mobility               General bed mobility comments: oob in bathroom on arrival alone  Transfers Overall transfer level: Needs assistance Equipment used: Rolling walker (2 wheeled) Transfers: Sit to/from Stand Sit to Stand: Supervision         General transfer comment: use of bil UE     Balance Overall balance assessment: Needs assistance Sitting-balance support: Feet supported Sitting balance-Leahy Scale: Good     Standing balance support: During functional activity Standing balance-Leahy Scale: Fair Standing balance comment: pt does use single ue support  after a few minutes due to fatigue. pt cued to prevent hip flexion                           ADL either performed or assessed with clinical judgement   ADL Overall ADL's : Modified independent Eating/Feeding: Independent   Grooming: Wash/dry face;Wash/dry hands;Oral care;Standing Grooming Details (indicate cue type and reason): pt with cue to avoid being initially and provided cups for 2 cup method. pt fully dressed at sink Upper Body Bathing: Independent   Lower Body Bathing: Min guard       Lower Body Dressing: Min guard;Sit to/from stand Lower Body Dressing Details (indicate cue type and reason): pt is able to complete figure 4 crossing bil LE and don doff L shoe. pt with bil UE used to help elevated L LE Toilet Transfer: Supervision/safety;RW       Tub/ Shower Transfer: Supervision/safety;Walk-in shower Tub/Shower Transfer Details (indicate cue type and reason): simulated with door frame to bathroom. Pt demonstrates decr elevation of L LE over threshold. Pt educated to lead with R LE not L LE.  Functional mobility during ADLs: Supervision/safety;Rolling walker General ADL Comments: pt educated on back precautions and prevent falls.      Vision   Vision Assessment?: No apparent visual deficits     Perception     Praxis      Pertinent Vitals/Pain Pain Assessment: Faces Faces Pain Scale: Hurts little more Pain Location: back Pain Descriptors / Indicators: Operative site guarding Pain  Intervention(s): Monitored during session;Premedicated before session;Repositioned(pt biggest c/o is weakness in BIL LE)     Hand Dominance Right   Extremity/Trunk Assessment Upper Extremity Assessment Upper Extremity Assessment: Overall WFL for tasks assessed   Lower Extremity Assessment Lower Extremity Assessment: LLE deficits/detail(pt reports bil LE weakness) LLE Deficits / Details: pt demonstrates weakness with attempt at stairs. Pt decr hip flexion and posteriorly  adducting and scissoring with stairs with contralateral elevation compared to L side of the body. Pt is pulling with rail to lift entire L side   Cervical / Trunk Assessment Cervical / Trunk Assessment: Other exceptions(s/p surg ) Cervical / Trunk Exceptions: pt with previous back surgeries and able to recall precautions   Communication Communication Communication: No difficulties   Cognition Arousal/Alertness: Awake/alert Behavior During Therapy: WFL for tasks assessed/performed Overall Cognitive Status: Within Functional Limits for tasks assessed                                 General Comments: w   General Comments  patient completed x3 stairs with rail. pt requires L LE blocked to descend. Pt insisting on attempting LLE first and unable to elevate himself on the first step. Pt pulling with bil UE attempting to achieve one step with LLE. pt advised to terminate task. Pt states "i can't do it. 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       Prior Functioning/Environment Level of Independence: Independent        Comments: works in Chemical engineer Problem List: Decreased activity tolerance;Decreased knowledge of use of DME or AE;Decreased knowledge of precautions;Decreased strength;Decreased range of motion;Impaired balance (sitting and/or  standing);Pain      OT Treatment/Interventions: Self-care/ADL training;Therapeutic activities;Therapeutic exercise;DME and/or AE instruction;Balance training;Patient/family education;Energy conservation    OT Goals(Current goals can be found in the care plan section) Acute Rehab OT Goals Patient Stated Goal: to return to work OT Goal Formulation: With patient Time For Goal Achievement: 02/08/18 Potential to Achieve Goals: Good  OT Frequency: Min 2X/week   Barriers to D/C: Decreased caregiver support          Co-evaluation              AM-PAC PT "6 Clicks" Daily Activity     Outcome Measure Help from another person eating meals?: None Help from another person taking care of personal grooming?: None Help from another person toileting, which includes using toliet, bedpan, or urinal?: A Little Help from another person bathing (including washing, rinsing, drying)?: None Help from another person to put on and taking off regular upper body clothing?: None Help from another person to put on and taking off regular lower body clothing?: A Little 6 Click Score: 22   End of Session Equipment Utilized During Treatment: Rolling walker;Back brace Nurse Communication: Mobility status;Precautions  Activity Tolerance: Patient tolerated treatment well Patient left: in chair;with call bell/phone within reach(educated not to walk for a mile)  OT Visit Diagnosis: Unsteadiness on feet (R26.81)                Time: 8182-9937 OT Time Calculation (min): 29 min Charges:  OT General Charges $OT Visit: 1 Visit OT Evaluation $OT Eval Moderate Complexity: 1 Mod OT Treatments $Self Care/Home Management : 8-22 mins G-Codes:      Jeri Modena   OTR/L Pager: [phone] Office: [phone] .   Parke Poisson B 01/25/2018, 11:15 AM

## 2018-01-25 NOTE — Evaluation (Signed)
Physical Therapy Evaluation Patient Details Name: Larry [name] MRN: [id number] DOB: Feb 23, 1951 Today's Date: 01/25/2018   History of Present Illness  67 yo male s/p PLIF L5-S1 WITH REVISION OF HARDWARE LUMBAR THREE TO ILIUM  Clinical Impression  Pt admitted with/for elective lumbar fusion surgery.  Pt's legs notably weak with left hip drop, but safe enough to mobilize independently .  Pt currently limited functionally due to the problems listed below.  (see problems list.)  Pt will benefit from PT to maximize function and safety to be able to get home safely with available assist.     Follow Up Recommendations No PT follow up    Equipment Recommendations  None recommended by PT    Recommendations for Other Services       Precautions / Restrictions Precautions Precautions: Back Precaution Comments: handout provided and reviewed in detail for adls. pt with x5 back surgeries at this time. pt familiar with back brace Required Braces or Orthoses: Spinal Brace Spinal Brace: Lumbar corset;Applied in sitting position      Mobility  Bed Mobility Overal bed mobility: Modified Independent             General bed mobility comments: great technique, but pt having mild difficulty getting legs in the bed.  Transfers Overall transfer level: Needs assistance Equipment used: Rolling walker (2 wheeled) Transfers: Sit to/from Stand Sit to Stand: Supervision         General transfer comment: bil ue use for transfer  Ambulation/Gait Ambulation/Gait assistance: Supervision(pt has been up in the room without assist) Ambulation Distance (Feet): 500 Feet Assistive device: None Gait Pattern/deviations: Step-through pattern Gait velocity: moderate   General Gait Details: notable hip drop on left, worsening mildly with distance and increased speed.  Also mild right foot slap.  Stairs            Wheelchair Mobility    Modified Rankin (Stroke Patients Only)        Balance Overall balance assessment: Needs assistance Sitting-balance support: Feet supported Sitting balance-Leahy Scale: Good     Standing balance support: During functional activity Standing balance-Leahy Scale: Fair Standing balance comment: pt does use single ue support after a few minutes due to fatigue. pt cued to prevent hip flexion                 Standardized Balance Assessment Standardized Balance Assessment : Dynamic Gait Index           Pertinent Vitals/Pain Pain Assessment: Faces Faces Pain Scale: Hurts little more Pain Location: back Pain Descriptors / Indicators: Operative site guarding Pain Intervention(s): Monitored during session    Home Living Family/patient expects to be discharged to:: Private residence Living Arrangements: Alone Available Help at Discharge: Family Type of Home: House Home Access: Stairs to enter Entrance Stairs-Rails: Right;Left;Can reach both Technical brewer of Steps: 3 Home Layout: One level Home Equipment: Walker - 2 wheels Additional Comments: pt is divorced and lives alone, 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     Prior Function Level of Independence: Independent         Comments: works in Solicitor   Dominant Hand: Right    Extremity/Trunk Assessment   Upper Extremity Assessment Upper Extremity Assessment: Overall WFL for tasks assessed    Lower Extremity Assessment Lower Extremity Assessment: Overall WFL for tasks assessed LLE Deficits / Details: pt demonstrates weakness with attempt at stairs. Pt decr hip flexion and posteriorly adducting  and scissoring with stairs with contralateral elevation compared to L side of the body. Pt is pulling with rail to lift entire L side    Cervical / Trunk Assessment Cervical / Trunk Assessment: Other exceptions(s/p surg ) Cervical / Trunk Exceptions: pt with previous back surgeries and able to recall  precautions  Communication   Communication: No difficulties  Cognition Arousal/Alertness: Awake/alert Behavior During Therapy: WFL for tasks assessed/performed Overall Cognitive Status: Within Functional Limits for tasks assessed                                 General Comments: w      General Comments General comments (skin integrity, edema, etc.): Reinforced back care.    Exercises     Assessment/Plan    PT Assessment Patient needs continued PT services  PT Problem List Decreased strength;Decreased mobility;Decreased knowledge of precautions;Pain       PT Treatment Interventions Gait training;Stair training;Functional mobility training;Therapeutic activities;Patient/family education    PT Goals (Current goals can be found in the Care Plan section)  Acute Rehab PT Goals Patient Stated Goal: to return to work PT Goal Formulation: With patient Time For Goal Achievement: 02/01/18 Potential to Achieve Goals: Good    Frequency Min 5X/week   Barriers to discharge        Co-evaluation               AM-PAC PT "6 Clicks" Daily Activity  Outcome Measure Difficulty turning over in bed (including adjusting bedclothes, sheets and blankets)?: None Difficulty moving from lying on back to sitting on the side of the bed? : None Difficulty sitting down on and standing up from a chair with arms (e.g., wheelchair, bedside commode, etc,.)?: None Help needed moving to and from a bed to chair (including a wheelchair)?: None Help needed walking in hospital room?: None Help needed climbing 3-5 steps with a railing? : A Little 6 Click Score: 23    End of Session   Activity Tolerance: Patient tolerated treatment well Patient left: in chair;with call bell/phone within reach Nurse Communication: Mobility status PT Visit Diagnosis: Unsteadiness on feet (R26.81);Other abnormalities of gait and mobility (R26.89)    Time: 1610-9604 PT Time Calculation (min) (ACUTE  ONLY): 24 min   Charges:   PT Evaluation $PT Eval Low Complexity: 1 Low PT Treatments $Gait Training: 8-22 mins   PT G Codes:        2018-02-17  Donnella Sham, PT [phone]  (pager)  Tessie Fass Rynlee [place] 2018-02-17, 11:29 AM

## 2018-01-25 NOTE — Social Work (Signed)
CSW acknowledging consult for SNF placement, pt has been seen by PT and they are recommending no PT follow up. At this time pt is not SNF appropriate.   CSW signing off. Please consult if any additional needs arise.  Alexander Mt, [place] [facility] [phone]

## 2018-01-25 NOTE — Anesthesia Postprocedure Evaluation (Signed)
Anesthesia Post Note  Patient: Larry [name]  Procedure(s) Performed: POSTERIOR LUMBAR INTERBODY FUSION LUMBAR FIVE-SACRAL ONE WITH REVISION OF HARDWARE LUMBAR THREE TO ILIUM (N/A Back)     Patient location during evaluation: PACU Anesthesia Type: General Level of consciousness: awake and alert Pain management: pain level controlled Vital Signs Assessment: post-procedure vital signs reviewed and stable Respiratory status: spontaneous breathing, nonlabored ventilation, respiratory function stable and patient connected to nasal cannula oxygen Cardiovascular status: blood pressure returned to baseline and stable Postop Assessment: no apparent nausea or vomiting Anesthetic complications: no    Last Vitals:  Vitals:   01/25/18 0000 01/25/18 0300  BP: 115/63   Pulse: 80   Resp:    Temp:  36.6 C  SpO2: 92%     Last Pain:  Vitals:   01/25/18 0545  TempSrc:   PainSc: [name]

## 2018-01-26 LAB — TYPE AND SCREEN
ABO/RH(D): A POS
Antibody Screen: NEGATIVE
Unit division: 0
Unit division: 0

## 2018-01-26 LAB — BPAM RBC
Blood Product Expiration Date: 201905222359
Blood Product Expiration Date: 201905222359
ISSUE DATE / TIME: 201904241106
ISSUE DATE / TIME: 201904241106
Unit Type and Rh: 6200
Unit Type and Rh: 6200

## 2018-02-16 ENCOUNTER — Inpatient Hospital Stay (HOSPITAL_COMMUNITY)
Admission: EM | Admit: 2018-02-16 | Discharge: 2018-02-23 | DRG: 872 | Disposition: A | Discharge status: Home / Self Care | Payer: Medicare Other | Attending: Internal Medicine | Admitting: Internal Medicine

## 2018-02-16 ENCOUNTER — Emergency Department (HOSPITAL_COMMUNITY): Payer: Medicare Other

## 2018-02-16 ENCOUNTER — Encounter (HOSPITAL_COMMUNITY): Payer: Self-pay | Admitting: Emergency Medicine

## 2018-02-16 DIAGNOSIS — A4102 Sepsis due to Methicillin resistant Staphylococcus aureus: Secondary | ICD-10-CM | POA: Diagnosis present

## 2018-02-16 DIAGNOSIS — Z23 Encounter for immunization: Secondary | ICD-10-CM | POA: Diagnosis not present

## 2018-02-16 DIAGNOSIS — D696 Thrombocytopenia, unspecified: Secondary | ICD-10-CM | POA: Diagnosis present

## 2018-02-16 DIAGNOSIS — N390 Urinary tract infection, site not specified: Secondary | ICD-10-CM | POA: Diagnosis not present

## 2018-02-16 DIAGNOSIS — L0390 Cellulitis, unspecified: Secondary | ICD-10-CM

## 2018-02-16 DIAGNOSIS — I10 Essential (primary) hypertension: Secondary | ICD-10-CM | POA: Diagnosis present

## 2018-02-16 DIAGNOSIS — L03116 Cellulitis of left lower limb: Secondary | ICD-10-CM

## 2018-02-16 DIAGNOSIS — G8929 Other chronic pain: Secondary | ICD-10-CM | POA: Diagnosis not present

## 2018-02-16 DIAGNOSIS — L089 Local infection of the skin and subcutaneous tissue, unspecified: Secondary | ICD-10-CM | POA: Diagnosis present

## 2018-02-16 DIAGNOSIS — R509 Fever, unspecified: Secondary | ICD-10-CM | POA: Diagnosis not present

## 2018-02-16 DIAGNOSIS — M79671 Pain in right foot: Secondary | ICD-10-CM | POA: Diagnosis not present

## 2018-02-16 DIAGNOSIS — Z87891 Personal history of nicotine dependence: Secondary | ICD-10-CM

## 2018-02-16 DIAGNOSIS — Z981 Arthrodesis status: Secondary | ICD-10-CM

## 2018-02-16 DIAGNOSIS — M545 Low back pain: Secondary | ICD-10-CM | POA: Diagnosis present

## 2018-02-16 DIAGNOSIS — A419 Sepsis, unspecified organism: Secondary | ICD-10-CM | POA: Diagnosis not present

## 2018-02-16 DIAGNOSIS — M79609 Pain in unspecified limb: Secondary | ICD-10-CM | POA: Diagnosis not present

## 2018-02-16 DIAGNOSIS — M7989 Other specified soft tissue disorders: Secondary | ICD-10-CM | POA: Diagnosis not present

## 2018-02-16 DIAGNOSIS — E039 Hypothyroidism, unspecified: Secondary | ICD-10-CM | POA: Diagnosis present

## 2018-02-16 DIAGNOSIS — Z96651 Presence of right artificial knee joint: Secondary | ICD-10-CM | POA: Diagnosis present

## 2018-02-16 DIAGNOSIS — L02416 Cutaneous abscess of left lower limb: Secondary | ICD-10-CM | POA: Diagnosis present

## 2018-02-16 DIAGNOSIS — R609 Edema, unspecified: Secondary | ICD-10-CM | POA: Diagnosis not present

## 2018-02-16 DIAGNOSIS — B9620 Unspecified Escherichia coli [E. coli] as the cause of diseases classified elsewhere: Secondary | ICD-10-CM | POA: Diagnosis not present

## 2018-02-16 DIAGNOSIS — D61818 Other pancytopenia: Secondary | ICD-10-CM | POA: Diagnosis not present

## 2018-02-16 LAB — CBC WITH DIFFERENTIAL/PLATELET
Abs Immature Granulocytes: 0.1 10*3/uL (ref 0.0–0.1)
Basophils Absolute: 0 10*3/uL (ref 0.0–0.1)
Basophils Relative: 0 %
Eosinophils Absolute: 0 10*3/uL (ref 0.0–0.7)
Eosinophils Relative: 0 %
HCT: 42.9 % (ref 39.0–52.0)
Hemoglobin: 14.1 g/dL (ref 13.0–17.0)
Immature Granulocytes: 0 %
Lymphocytes Relative: 4 %
Lymphs Abs: 0.4 10*3/uL — ABNORMAL LOW (ref 0.7–4.0)
MCH: 31.7 pg (ref 26.0–34.0)
MCHC: 32.9 g/dL (ref 30.0–36.0)
MCV: 96.4 fL (ref 78.0–100.0)
Monocytes Absolute: 0.6 10*3/uL (ref 0.1–1.0)
Monocytes Relative: 5 %
Neutro Abs: 10.8 10*3/uL — ABNORMAL HIGH (ref 1.7–7.7)
Neutrophils Relative %: 91 %
Platelets: 171 10*3/uL (ref 150–400)
RBC: 4.45 MIL/uL (ref 4.22–5.81)
RDW: 13.7 % (ref 11.5–15.5)
WBC: 12 10*3/uL — ABNORMAL HIGH (ref 4.0–10.5)

## 2018-02-16 LAB — URINALYSIS, ROUTINE W REFLEX MICROSCOPIC
Bilirubin Urine: NEGATIVE
Glucose, UA: NEGATIVE mg/dL
Ketones, ur: NEGATIVE mg/dL
Nitrite: POSITIVE — AB
Protein, ur: 30 mg/dL — AB
Specific Gravity, Urine: 1.015 (ref 1.005–1.030)
WBC, UA: >50 WBC/hpf — ABNORMAL HIGH (ref 0–5)
pH: 7 (ref 5.0–8.0)

## 2018-02-16 LAB — I-STAT CG4 LACTIC ACID, ED: Lactic Acid, Venous: 1.41 mmol/L (ref 0.5–1.9)

## 2018-02-16 LAB — COMPREHENSIVE METABOLIC PANEL
ALT: 23 U/L (ref 17–63)
AST: 29 U/L (ref 15–41)
Albumin: 4.1 g/dL (ref 3.5–5.0)
Alkaline Phosphatase: 75 U/L (ref 38–126)
Anion gap: 11 (ref 5–15)
BUN: 13 mg/dL (ref 6–20)
CO2: 28 mmol/L (ref 22–32)
Calcium: 8.9 mg/dL (ref 8.9–10.3)
Chloride: 98 mmol/L — ABNORMAL LOW (ref 101–111)
Creatinine, Ser: 1.01 mg/dL (ref 0.61–1.24)
GFR calc Af Amer: >60 mL/min (ref >60)
GFR calc non Af Amer: >60 mL/min (ref >60)
Glucose, Bld: 111 mg/dL — ABNORMAL HIGH (ref 65–99)
Potassium: 3.8 mmol/L (ref 3.5–5.1)
Sodium: 137 mmol/L (ref 135–145)
Total Bilirubin: 1.5 mg/dL — ABNORMAL HIGH (ref 0.3–1.2)
Total Protein: 6.7 g/dL (ref 6.5–8.1)

## 2018-02-16 MED ORDER — DIAZEPAM 5 MG PO TABS: 5.0000 mg | ORAL_TABLET | Freq: Four times a day (QID) | ORAL | Status: DC | PRN

## 2018-02-16 MED ORDER — ONDANSETRON HCL 4 MG/2ML IJ SOLN: 4.0000 mg | Freq: Four times a day (QID) | INTRAMUSCULAR | Status: DC | PRN

## 2018-02-16 MED ORDER — HYDROMORPHONE HCL 2 MG PO TABS
2.0000 mg | ORAL_TABLET | ORAL | Status: DC | PRN
  Administered 2018-02-16 – 2018-02-23 (×4): 2 mg via ORAL
  Filled 2018-02-16 (×5): qty 1

## 2018-02-16 MED ORDER — TETANUS-DIPHTH-ACELL PERTUSSIS 5-2.5-18.5 LF-MCG/0.5 IM SUSP: 0.5000 mL | Freq: Once | INTRAMUSCULAR | Status: DC

## 2018-02-16 MED ORDER — TAMSULOSIN HCL 0.4 MG PO CAPS
0.4000 mg | ORAL_CAPSULE | Freq: Every day | ORAL | Status: DC
  Administered 2018-02-17 – 2018-02-23 (×6): 0.4 mg via ORAL
  Filled 2018-02-16 (×7): qty 1

## 2018-02-16 MED ORDER — ONDANSETRON HCL 4 MG PO TABS: 4.0000 mg | ORAL_TABLET | Freq: Four times a day (QID) | ORAL | Status: DC | PRN

## 2018-02-16 MED ORDER — ACETAMINOPHEN 325 MG PO TABS
650.0000 mg | ORAL_TABLET | Freq: Once | ORAL | Status: AC
  Administered 2018-02-16: 650 mg via ORAL
  Filled 2018-02-16: qty 2

## 2018-02-16 MED ORDER — SODIUM CHLORIDE 0.9 % IV SOLN
1500.0000 mg | Freq: Once | INTRAVENOUS | Status: AC
  Administered 2018-02-16: 1500 mg via INTRAVENOUS
  Filled 2018-02-16: qty 1500

## 2018-02-16 MED ORDER — PIPERACILLIN-TAZOBACTAM 3.375 G IVPB 30 MIN
3.3750 g | Freq: Once | INTRAVENOUS | Status: AC
  Administered 2018-02-16: 3.375 g via INTRAVENOUS
  Filled 2018-02-16: qty 50

## 2018-02-16 MED ORDER — LORAZEPAM 1 MG PO TABS
1.0000 mg | ORAL_TABLET | Freq: Every day | ORAL | Status: DC
  Administered 2018-02-16 – 2018-02-22 (×7): 1 mg via ORAL
  Filled 2018-02-16 (×7): qty 1

## 2018-02-16 MED ORDER — SODIUM CHLORIDE 0.9 % IV BOLUS
1000.0000 mL | Freq: Once | INTRAVENOUS | Status: AC
  Administered 2018-02-16: 1000 mL via INTRAVENOUS

## 2018-02-16 MED ORDER — VANCOMYCIN HCL IN DEXTROSE 1-5 GM/200ML-% IV SOLN
1000.0000 mg | Freq: Two times a day (BID) | INTRAVENOUS | Status: DC
  Administered 2018-02-17 – 2018-02-18 (×3): 1000 mg via INTRAVENOUS
  Filled 2018-02-16 (×4): qty 200

## 2018-02-16 MED ORDER — VANCOMYCIN HCL IN DEXTROSE 1-5 GM/200ML-% IV SOLN
1000.0000 mg | Freq: Once | INTRAVENOUS | Status: DC
  Filled 2018-02-16: qty 200

## 2018-02-16 MED ORDER — SENNOSIDES-DOCUSATE SODIUM 8.6-50 MG PO TABS
1.0000 | ORAL_TABLET | Freq: Every evening | ORAL | Status: DC | PRN
  Administered 2018-02-18: 1 via ORAL
  Filled 2018-02-16 (×2): qty 1

## 2018-02-16 MED ORDER — LATANOPROST 0.005 % OP SOLN
1.0000 [drp] | Freq: Every day | OPHTHALMIC | Status: DC
  Administered 2018-02-17 – 2018-02-22 (×6): 1 [drp] via OPHTHALMIC
  Filled 2018-02-16: qty 2.5

## 2018-02-16 MED ORDER — BISACODYL 5 MG PO TBEC: 5.0000 mg | DELAYED_RELEASE_TABLET | Freq: Every day | ORAL | Status: DC | PRN

## 2018-02-16 MED ORDER — ACETAMINOPHEN 650 MG RE SUPP: 650.0000 mg | Freq: Four times a day (QID) | RECTAL | Status: DC | PRN

## 2018-02-16 MED ORDER — LIDOCAINE HCL 2 % IJ SOLN
5.0000 mL | Freq: Once | INTRAMUSCULAR | Status: AC
  Administered 2018-02-16: 100 mg
  Filled 2018-02-16: qty 20

## 2018-02-16 MED ORDER — ENOXAPARIN SODIUM 40 MG/0.4ML ~~LOC~~ SOLN
40.0000 mg | Freq: Every day | SUBCUTANEOUS | Status: DC
  Filled 2018-02-16: qty 0.4

## 2018-02-16 MED ORDER — HYDROCODONE-ACETAMINOPHEN 5-325 MG PO TABS
1.0000 | ORAL_TABLET | Freq: Four times a day (QID) | ORAL | Status: DC | PRN
  Administered 2018-02-17 – 2018-02-23 (×13): 1 via ORAL
  Filled 2018-02-16 (×13): qty 1

## 2018-02-16 MED ORDER — ASPIRIN EC 81 MG PO TBEC
81.0000 mg | DELAYED_RELEASE_TABLET | Freq: Every day | ORAL | Status: DC
  Administered 2018-02-17 – 2018-02-23 (×7): 81 mg via ORAL
  Filled 2018-02-16 (×7): qty 1

## 2018-02-16 MED ORDER — VERAPAMIL HCL ER 180 MG PO TBCR
180.0000 mg | EXTENDED_RELEASE_TABLET | Freq: Every day | ORAL | Status: DC
  Administered 2018-02-17 – 2018-02-21 (×5): 180 mg via ORAL
  Filled 2018-02-16 (×8): qty 1

## 2018-02-16 MED ORDER — LEVOTHYROXINE SODIUM 75 MCG PO TABS
150.0000 ug | ORAL_TABLET | Freq: Every day | ORAL | Status: DC
  Administered 2018-02-17 – 2018-02-23 (×7): 150 ug via ORAL
  Filled 2018-02-16 (×8): qty 2

## 2018-02-16 MED ORDER — ENOXAPARIN SODIUM 40 MG/0.4ML ~~LOC~~ SOLN
40.0000 mg | Freq: Every day | SUBCUTANEOUS | Status: DC
  Administered 2018-02-17 – 2018-02-18 (×2): 40 mg via SUBCUTANEOUS
  Filled 2018-02-16 (×2): qty 0.4

## 2018-02-16 MED ORDER — ACETAMINOPHEN 325 MG PO TABS
650.0000 mg | ORAL_TABLET | Freq: Four times a day (QID) | ORAL | Status: DC | PRN
  Administered 2018-02-17 – 2018-02-20 (×4): 650 mg via ORAL
  Filled 2018-02-16 (×4): qty 2

## 2018-02-16 NOTE — ED Provider Notes (Signed)
[facility] EMERGENCY DEPARTMENT Provider Note   CSN: [id number] Arrival date & time: 02/16/18  1945     History   Chief Complaint Chief Complaint  Patient presents with  . Leg Pain  . Abscess    HPI Larry [name] is a 67 y.o. male hx of arthritis, previous spinal surgeries here with L leg pain and swelling.  Patient states that he accidentally kicked a bed frame with the left shin about 3 to 4 days ago.  He noticed that there is increasing redness and swelling in the area and he has been trying to himself.  He states that swelling got worse and he noticed a small abscess around that area.  He has been draining daily fevers for the last 2 days, around 102- 103 at home. Patient went to urgent care was sent here for further evaluation.  Patient states that he did have spinal surgery about 3 weeks ago and his back pain is stable and he denies any worsening weakness or numbness.   The history is provided by the patient.    Past Medical History:  Diagnosis Date  . Arthritis    "knees; left shoulder" (04/12/2013)  . Asthma    "as a child" (04/12/2013)  . DDD (degenerative disc disease), cervical   . DDD (degenerative disc disease), lumbar   . Difficult intubation    2012   CERVICAL FUSION   . Headache(784.0)    VERAPAMIL FOR PREVENTION   . 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 History of chicken pox    as an adult  . Hypoglycemia    last episode 1 yr. ago, just gets jittery  . Hypothyroidism   . Lumbar stenosis   . OSA (obstructive sleep apnea)    "haven't wore mask in years; had OR; still snore" (04/12/2013)  . Urinary frequency     Patient Active Problem List   Diagnosis Date Noted  . Lumbar radiculopathy, chronic 01/24/2018  . Lumbar stenosis with neurogenic claudication 03/23/2017  . Hypothyroidism 07/25/2015  . Primary osteoarthritis of right knee  07/23/2015  . Primary osteoarthritis of knee 07/23/2015    Past Surgical History:  Procedure Laterality Date  . BACK SURGERY    . CERVICAL FUSION  2002; 2012  . CYSTECTOMY    . INGUINAL HERNIA REPAIR Bilateral 1997  . KNEE ARTHROSCOPY Left ~ 1997; ~ 2005  . LUMBAR FUSION  2007; 04/11/2013  . PENILE DEBRIDEMENT  ~ 2011   X 3  FOR MRSA INFECTION    . SHOULDER ARTHROSCOPY Left ~ 2003  . TONSILLECTOMY  1990's  . TOTAL KNEE ARTHROPLASTY Right 07/23/2015  . TOTAL KNEE ARTHROPLASTY Right 07/23/2015   Procedure: TOTAL KNEE ARTHROPLASTY;  Surgeon: Garald Balding, MD;  Location: [facility];  Service: Orthopedics;  Laterality: Right;  . UVULOPALATOPHARYNGOPLASTY (UPPP)/TONSILLECTOMY/SEPTOPLASTY  1990's  . VASECTOMY          Home Medications    Prior to Admission medications   Medication Sig Start Date End Date Taking? Authorizing Provider  aspirin EC 81 MG tablet Take 81 mg by mouth daily.   Yes [provider]  diazepam (VALIUM) 5 MG tablet Take 1 tablet (5 mg total) by mouth every 6 (six) hours as needed for muscle spasms. 01/25/18 01/25/19 Yes Elsner, Mallie Mussel, MD  furosemide (LASIX) 40 MG tablet Take 40 mg by mouth daily as needed  for edema.    Yes [provider]  HYDROcodone-acetaminophen (NORCO/VICODIN) 5-325 MG tablet Take 1 tablet by mouth every 6 (six) hours as needed for pain.  12/30/17  Yes [provider]  HYDROmorphone (DILAUDID) 2 MG tablet Take 1 tablet (2 mg total) by mouth every 4 (four) hours as needed for severe pain. 01/25/18  Yes Elsner, Mallie Mussel, MD  latanoprost (XALATAN) 0.005 % ophthalmic solution Place 1 drop into both eyes at bedtime. 03/13/17  Yes [provider]  levothyroxine (SYNTHROID, LEVOTHROID) 150 MCG tablet Take 150 mcg by mouth daily before breakfast.   Yes [provider]  LORazepam (ATIVAN) 0.5 MG tablet Take 1 mg by mouth at bedtime.  03/14/17  Yes [provider]  Multiple Vitamins-Minerals (MULTIVITAMIN PO)  Take 1 tablet by mouth daily.   Yes [provider]  Oxymetazoline HCl (AFRIN NASAL SPRAY NA) Place 2 sprays into both nostrils 2 (two) times daily as needed (nasal congestion).   Yes [provider]  Phosphatidylserine-DHA-EPA (VAYACOG) 100-19.5-6.5 MG CAPS Take 1 capsule by mouth daily.   Yes [provider]  Sildenafil Citrate (VIAGRA PO) Take 30 mg by mouth daily as needed (for erectile dysfunction). Purchased from [employer]--dose indicated on website as 30 mg   Yes [provider]  tamsulosin (FLOMAX) 0.4 MG CAPS capsule Take 0.4 mg by mouth daily.   Yes [provider]  testosterone cypionate (DEPOTESTOSTERONE CYPIONATE) 200 MG/ML injection Inject 100 mg into the muscle every 14 (fourteen) days. 02/07/18  Yes [provider]  verapamil (CALAN-SR) 180 MG CR tablet Take 180 mg by mouth daily. 12/31/16  Yes [provider]  rivaroxaban (XARELTO) 10 MG TABS tablet Take 1 tablet (10 mg total) by mouth daily with breakfast. Patient not taking: Reported on 03/16/2017 07/25/15   Cherylann Ratel, PA-C    Family History History reviewed. No pertinent family history.  Social History Social History   Tobacco Use  . Smoking status: Former Smoker    Packs/day: 0.50    Years: 15.00    Pack years: 7.50    Types: Cigarettes    Last attempt to quit: 10/14/1986    Years since quitting: 31.3  . Smokeless tobacco: Never Used  Substance Use Topics  . Alcohol use: No  . Drug use: No     Allergies   Patient has no known allergies.   Review of Systems Review of Systems  Constitutional: Positive for fever.  Skin: Positive for wound.  All other systems reviewed and are negative.    Physical Exam Updated Vital Signs BP (!) 162/77 (BP Location: Left Arm)   Pulse 100   Temp (!) 102.4 F (39.1 C) (Oral)   Resp 20   SpO2 96%   Physical Exam  Constitutional: He appears well-developed.  HENT:  Head: Normocephalic.    Mouth/Throat: Oropharynx is clear and moist.  Eyes: Pupils are equal, round, and reactive to light. Conjunctivae and EOM are normal.  Neck: Normal range of motion. Neck supple.  Cardiovascular: Normal rate, regular rhythm and normal heart sounds.  Pulmonary/Chest: Effort normal and breath sounds normal. No stridor. No respiratory distress. He has no wheezes.  Abdominal: Soft. Bowel sounds are normal. He exhibits no distension. There is no tenderness. There is no guarding.  Musculoskeletal:  Laminectomy scar healing well. There is erythema on L shin with 3 cm abscess vs infected cyst. No obvious bony tenderness   Skin: There is erythema.  Psychiatric: He has a normal mood and affect.  Nursing note and vitals reviewed.      ED Treatments / Results  Labs (all labs ordered are listed, but only abnormal results are displayed) Labs Reviewed  CBC WITH DIFFERENTIAL/PLATELET - Abnormal; Notable for the following components:      Result Value   WBC 12.0 (*)    Neutro Abs 10.8 (*)    Lymphs Abs 0.4 (*)    All other components within normal limits  CULTURE, BLOOD (ROUTINE X 2)  CULTURE, BLOOD (ROUTINE X 2)  URINE CULTURE  AEROBIC CULTURE (SUPERFICIAL SPECIMEN)  COMPREHENSIVE METABOLIC PANEL  URINALYSIS, ROUTINE W REFLEX MICROSCOPIC  I-STAT CG4 LACTIC ACID, ED    EKG None  Radiology Dg Chest 2 View  Result Date: 02/16/2018 CLINICAL DATA:  Fever EXAM: CHEST - 2 VIEW COMPARISON:  03/18/2016 FINDINGS: Postsurgical changes of the cervical spine. Mild cardiomegaly. No acute airspace disease or pleural effusion. Degenerative changes of the spine. Ovoid opacity anteriorly on the lateral view may reflect prominent cartilaginous calcification or an artifact. Metallic clip left lateral lower chest wall. IMPRESSION: No active cardiopulmonary disease.  Mild cardiomegaly. Electronically Signed   By: Donavan Foil M.D.   On: 02/16/2018 21:22   Dg Tibia/fibula Left  Result Date:  02/16/2018 CLINICAL DATA:  Cellulitis EXAM: LEFT TIBIA AND FIBULA - 2 VIEW COMPARISON:  None. FINDINGS: No acute displaced fracture or malalignment. No periostitis or bone destruction. Diffuse soft tissue edema. No foreign body or soft tissue gas. IMPRESSION: No acute osseous abnormality Electronically Signed   By: Donavan Foil M.D.   On: 02/16/2018 21:23    Procedures Procedures (including critical care time)  INCISION AND DRAINAGE Performed by: Wandra Arthurs Consent: Verbal consent obtained. Risks and benefits: risks, benefits and alternatives were discussed Type: abscess  Body area: L tib/fib   Anesthesia: local infiltration  Incision was made with a scalpel.  Local anesthetic: lidocaine 2 % no epinephrine  Anesthetic total: 5 ml  Complexity: complex Blunt dissection to break up loculations  Drainage: purulent  Drainage amount: scant   Packing material: none  Patient tolerance: Patient tolerated the procedure well with no immediate complications.     Medications Ordered in ED Medications  sodium chloride 0.9 % bolus 1,000 mL (has no administration in time range)  lidocaine (XYLOCAINE) 2 % (with pres) injection 100 mg (has no administration in time range)  vancomycin (VANCOCIN) IVPB 1000 mg/200 mL premix (has no administration in time range)  piperacillin-tazobactam (ZOSYN) IVPB 3.375 g (has no administration in time range)  acetaminophen (TYLENOL) tablet 650 mg (has no administration in time range)     Initial Impression / Assessment and Plan / ED Course  I have reviewed the triage vital signs and the nursing notes.  Pertinent labs & imaging results that were available during my care of the patient were reviewed by me and considered in my medical decision making (see chart for details).     IZAC FAULKENBERRY is a 67 y.o. male here with L leg redness and fluctuance, fevers. I think likely has abscess vs infected cyst. Will get xray to r/o osteo or necrotizing  fasciitis. Will initiate sepsis workup with CBC, CMP. Will get wound culture as well.   9:33 PM Xray showed no air. WBC 12. I and d performed, wound culture sent. Given IV vanc/zosyn. Hospitalist to admit.    Final Clinical Impressions(s) / ED Diagnoses   Final diagnoses:  None    ED Discharge Orders    None  Drenda Freeze, MD 02/16/18 [phone]

## 2018-02-16 NOTE — Progress Notes (Signed)
Pharmacy Antibiotic Note  DARYEL KENNETH is a 67 y.o. male admitted on 02/16/2018 with cellulitis.  Pharmacy has been consulted for vancomycin dosing. WBC 12. SCr 1.01. LA 1.41. CrCl ~ 70-75 mL/min   Plan: -Vancomycin 1500 mg IV load, then vancomycin 1 gm IV Q 12 hours -Monitor CBC, renal fx, cultures and clinical progress -VT at SS      Temp (24hrs), Avg:102.4 F (39.1 C), Min:102.4 F (39.1 C), Max:102.4 F (39.1 C)  Recent Labs  Lab 02/16/18 2012 02/16/18 2034  WBC 12.0*  --   CREATININE 1.01  --   LATICACIDVEN  --  1.41    CrCl cannot be calculated (Unknown ideal weight.).    No Known Allergies  Antimicrobials this admission: Vanc 5/22 >>   Dose adjustments this admission:  Microbiology results: 5/22 BCx:  5/22 Wound abscess  Thank you for allowing pharmacy to be a part of this patient's care.  Albertina Parr, PharmD., BCPS Clinical Pharmacist Clinical phone for 02/16/18 until 11pm: [phone] If after 11pm, please call main pharmacy at: [phone]

## 2018-02-16 NOTE — ED Triage Notes (Signed)
  Patient hit LL leg on edge of bed frame and had small abrasion.  Patient was trying to clean it the past few days and it has continued to get worse.  Patient has quarter sized abscess on LL shin that is red around it.  Patient states he has pain 6/10.  Patient has been having fevers as high as 103.

## 2018-02-16 NOTE — H&P (Signed)
History and Physical    Larry [name] FWY:[id number] DOB: [birth year] DOA: 02/16/2018  PCP: Merrilee Seashore, MD   Patient coming from: Home  Chief Complaint: fevers, left shin abscess  HPI: Larry [name] is a 67 y.o. male with medical history significant for hypertension, hypothyroidism, osteoarthritis with chronic pain, and degenerative disc disease status post lumbar fusion last month, now presenting to the emergency department with fevers, chills, and abscess involving the lower left leg.  Patient reports that he was recovering well from his recent low back surgery when he accidentally kicked his bed frame with left shin ~4 days ago; he suffered an abrasion. He has continued to bear weight on the left leg without significant pain but noted increasing swelling and surrounding erythema, and then an abscess yesterday. He reports fevers for the past 2 days.   ED Course: Upon arrival to the ED, patient is found to be febrile to 39.1 C, slightly tachycardic, and with vitals otherwise normal.  Chest x-ray is negative for acute cardiopulmonary disease and radiographs of the left lower leg are negative for underlying osseous abnormality.  Chemistry panel is unremarkable and CBC features a leukocytosis to 12,000.  Lactic acid is reassuringly normal.  I&D was performed in the emergency department, blood and wound cultures were sent, 1 L of normal saline administered, Tdap updated, and patient was started on empiric vancomycin and Zosyn.  He remains hemodynamically stable and will be admitted for ongoing evaluation and management of sepsis secondary to skin/soft tissue infection.  Review of Systems:  All other systems reviewed and apart from HPI, are negative.  Past Medical History:  Diagnosis Date  . Arthritis    "knees; left shoulder" (04/12/2013)  . Asthma    "as a child" (04/12/2013)  . DDD (degenerative disc disease), cervical   . DDD (degenerative disc disease), lumbar   . Difficult  intubation    2012   CERVICAL FUSION   . Headache(784.0)    VERAPAMIL FOR PREVENTION   . 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 History of chicken pox    as an adult  . Hypoglycemia    last episode 1 yr. ago, just gets jittery  . Hypothyroidism   . Lumbar stenosis   . OSA (obstructive sleep apnea)    "haven't wore mask in years; had OR; still snore" (04/12/2013)  . Urinary frequency     Past Surgical History:  Procedure Laterality Date  . BACK SURGERY    . CERVICAL FUSION  2002; 2012  . CYSTECTOMY    . INGUINAL HERNIA REPAIR Bilateral 1997  . KNEE ARTHROSCOPY Left ~ 1997; ~ 2005  . LUMBAR FUSION  2007; 04/11/2013  . PENILE DEBRIDEMENT  ~ 2011   X 3  FOR MRSA INFECTION    . SHOULDER ARTHROSCOPY Left ~ 2003  . TONSILLECTOMY  1990's  . TOTAL KNEE ARTHROPLASTY Right 07/23/2015  . TOTAL KNEE ARTHROPLASTY Right 07/23/2015   Procedure: TOTAL KNEE ARTHROPLASTY;  Surgeon: Garald Balding, MD;  Location: [facility];  Service: Orthopedics;  Laterality: Right;  . UVULOPALATOPHARYNGOPLASTY (UPPP)/TONSILLECTOMY/SEPTOPLASTY  1990's  . VASECTOMY       reports that he quit smoking about 31 years ago. His smoking use included cigarettes. He has a 7.50 pack-year smoking history. He has never used smokeless tobacco. He reports that he does not drink alcohol or use drugs.  No Known Allergies  History reviewed. No pertinent family history.   Prior to Admission medications   Medication Sig Start Date End Date Taking? Authorizing Provider  aspirin EC 81 MG tablet Take 81 mg by mouth daily.   Yes [provider]  diazepam (VALIUM) 5 MG tablet Take 1 tablet (5 mg total) by mouth every 6 (six) hours as needed for muscle spasms. 01/25/18 01/25/19 Yes Elsner, Mallie Mussel, MD  furosemide (LASIX) 40 MG tablet Take 40 mg by mouth daily as needed for edema.    Yes [provider]  HYDROcodone-acetaminophen  (NORCO/VICODIN) 5-325 MG tablet Take 1 tablet by mouth every 6 (six) hours as needed for pain.  12/30/17  Yes [provider]  HYDROmorphone (DILAUDID) 2 MG tablet Take 1 tablet (2 mg total) by mouth every 4 (four) hours as needed for severe pain. 01/25/18  Yes Elsner, Mallie Mussel, MD  latanoprost (XALATAN) 0.005 % ophthalmic solution Place 1 drop into both eyes at bedtime. 03/13/17  Yes [provider]  levothyroxine (SYNTHROID, LEVOTHROID) 150 MCG tablet Take 150 mcg by mouth daily before breakfast.   Yes [provider]  LORazepam (ATIVAN) 0.5 MG tablet Take 1 mg by mouth at bedtime.  03/14/17  Yes [provider]  Multiple Vitamins-Minerals (MULTIVITAMIN PO) Take 1 tablet by mouth daily.   Yes [provider]  Oxymetazoline HCl (AFRIN NASAL SPRAY NA) Place 2 sprays into both nostrils 2 (two) times daily as needed (nasal congestion).   Yes [provider]  Phosphatidylserine-DHA-EPA (VAYACOG) 100-19.5-6.5 MG CAPS Take 1 capsule by mouth daily.   Yes [provider]  Sildenafil Citrate (VIAGRA PO) Take 30 mg by mouth daily as needed (for erectile dysfunction). Purchased from [employer]--dose indicated on website as 30 mg   Yes [provider]  tamsulosin (FLOMAX) 0.4 MG CAPS capsule Take 0.4 mg by mouth daily.   Yes [provider]  testosterone cypionate (DEPOTESTOSTERONE CYPIONATE) 200 MG/ML injection Inject 100 mg into the muscle every 14 (fourteen) days. 02/07/18  Yes [provider]  verapamil (CALAN-SR) 180 MG CR tablet Take 180 mg by mouth daily. 12/31/16  Yes [provider]    Physical Exam: Vitals:   02/16/18 2004  BP: (!) 162/77  Pulse: 100  Resp: 20  Temp: (!) 102.4 F (39.1 C)  TempSrc: Oral  SpO2: 96%      Constitutional: NAD, calm  Eyes: PERTLA, lids and conjunctivae normal ENMT: Mucous membranes are moist. Posterior pharynx clear of any exudate or lesions.   Neck: normal, supple, no  masses, no thyromegaly Respiratory: clear to auscultation bilaterally, no wheezing, no crackles. Normal respiratory effort.   Cardiovascular: S1 & S2 heard, regular rate and rhythm. No significant JVD. Abdomen: No distension, no tenderness, soft. Bowel sounds normal.  Musculoskeletal: no clubbing / cyanosis. No joint deformity upper and lower extremities.   Skin: Abscess at anterior left shin is s/p I&D; there is significant surrounding erythema and heat. Warm, dry, well-perfused. Neurologic: CN 2-12 grossly intact. Sensation intact. Moving all extremities.  Psychiatric: Alert and oriented x 3. Calm, cooperative.     Labs on Admission: I have personally reviewed following labs and imaging studies  CBC: Recent Labs  Lab 02/16/18 2012  WBC 12.0*  NEUTROABS 10.8*  HGB 14.1  HCT 42.9  MCV 96.4  PLT 409   Basic Metabolic Panel: Recent Labs  Lab 02/16/18 2012  NA 137  K 3.8  CL 98*  CO2 28  GLUCOSE 111*  BUN 13  CREATININE 1.01  CALCIUM 8.9   GFR: CrCl cannot be calculated (Unknown ideal weight.). Liver Function Tests: Recent Labs  Lab 02/16/18 2012  AST 29  ALT 23  ALKPHOS 75  BILITOT 1.5*  PROT 6.7  ALBUMIN 4.1   No results for input(s): LIPASE, AMYLASE in the last 168 hours. No results for input(s): AMMONIA in the last 168 hours. Coagulation Profile: No results for input(s): INR, PROTIME in the last 168 hours. Cardiac Enzymes: No results for input(s): CKTOTAL, CKMB, CKMBINDEX, TROPONINI in the last 168 hours. BNP (last 3 results) No results for input(s): PROBNP in the last 8760 hours. HbA1C: No results for input(s): HGBA1C in the last 72 hours. CBG: No results for input(s): GLUCAP in the last 168 hours. Lipid Profile: No results for input(s): CHOL, HDL, LDLCALC, TRIG, CHOLHDL, LDLDIRECT in the last 72 hours. Thyroid Function Tests: No results for input(s): TSH, T4TOTAL, FREET4, T3FREE, THYROIDAB in the last 72 hours. Anemia Panel: No results for  input(s): VITAMINB12, FOLATE, FERRITIN, TIBC, IRON, RETICCTPCT in the last 72 hours. Urine analysis:    Component Value Date/Time   COLORURINE AMBER (A) 07/10/2015 1549   APPEARANCEUR CLEAR 07/10/2015 1549   LABSPEC 1.026 07/10/2015 1549   PHURINE 6.0 07/10/2015 1549   GLUCOSEU NEGATIVE 07/10/2015 1549   HGBUR NEGATIVE 07/10/2015 1549   BILIRUBINUR SMALL (A) 07/10/2015 1549   KETONESUR NEGATIVE 07/10/2015 1549   PROTEINUR NEGATIVE 07/10/2015 1549   UROBILINOGEN 1.0 07/10/2015 1549   NITRITE NEGATIVE 07/10/2015 1549   LEUKOCYTESUR NEGATIVE 07/10/2015 1549   Sepsis Labs: @LABRCNTIP (procalcitonin:4,lacticidven:4) )No results found for this or any previous visit (from the past 240 hour(s)).   Radiological Exams on Admission: Dg Chest 2 View  Result Date: 02/16/2018 CLINICAL DATA:  Fever EXAM: CHEST - 2 VIEW COMPARISON:  03/18/2016 FINDINGS: Postsurgical changes of the cervical spine. Mild cardiomegaly. No acute airspace disease or pleural effusion. Degenerative changes of the spine. Ovoid opacity anteriorly on the lateral view may reflect prominent cartilaginous calcification or an artifact. Metallic clip left lateral lower chest wall. IMPRESSION: No active cardiopulmonary disease.  Mild cardiomegaly. Electronically Signed   By: Donavan Foil M.D.   On: 02/16/2018 21:22   Dg Tibia/fibula Left  Result Date: 02/16/2018 CLINICAL DATA:  Cellulitis EXAM: LEFT TIBIA AND FIBULA - 2 VIEW COMPARISON:  None. FINDINGS: No acute displaced fracture or malalignment. No periostitis or bone destruction. Diffuse soft tissue edema. No foreign body or soft tissue gas. IMPRESSION: No acute osseous abnormality Electronically Signed   By: Donavan Foil M.D.   On: 02/16/2018 21:23    EKG: Not performed   Assessment/Plan   1. Sepsis secondary to skin/soft tissue infection  - Presents with abscess involving lower left leg and fevers  - Found to be febrile with leukocytosis and lower left leg abscess with  surrounding cellulitis  - Plain films negative for underlying osseous abnormality   - I&D was performed in ED, wound and blood cultures sent, vanc and Zosyn started, and Tdap updated  - He is hemodynamically stable and lactate reassuringly normal  - Continue vancomycin, follow cultures and clinical course   2. Hypertension  - BP at goal, continue verapamil   3. Hypothyroidism  - Continue Synthroid   4. Chronic low back pain  - Status-post lumbar fusion last month; reports progressing as expected without complication - Continue home regimen with prn Norco, Dilaudid, and Valium    DVT prophylaxis: Lovenox  Code Status: Full  Family Communication: Discussed with patient  Consults called: None Admission  status: Inpatient    Vianne Bulls, MD [facility] Pager [phone]  If 7PM-7AM, please contact night-coverage [email or web address] Password [facility]  02/16/2018, 9:55 PM

## 2018-02-17 ENCOUNTER — Other Ambulatory Visit: Payer: Self-pay

## 2018-02-17 LAB — CBC WITH DIFFERENTIAL/PLATELET
Abs Immature Granulocytes: 0.1 10*3/uL (ref 0.0–0.1)
Basophils Absolute: 0 10*3/uL (ref 0.0–0.1)
Basophils Relative: 0 %
Eosinophils Absolute: 0.1 10*3/uL (ref 0.0–0.7)
Eosinophils Relative: 0 %
HCT: 46.4 % (ref 39.0–52.0)
Hemoglobin: 14.9 g/dL (ref 13.0–17.0)
Immature Granulocytes: 1 %
Lymphocytes Relative: 5 %
Lymphs Abs: 0.7 10*3/uL (ref 0.7–4.0)
MCH: 31.8 pg (ref 26.0–34.0)
MCHC: 32.1 g/dL (ref 30.0–36.0)
MCV: 98.9 fL (ref 78.0–100.0)
Monocytes Absolute: 0.6 10*3/uL (ref 0.1–1.0)
Monocytes Relative: 4 %
Neutro Abs: 12.3 10*3/uL — ABNORMAL HIGH (ref 1.7–7.7)
Neutrophils Relative %: 90 %
Platelets: 162 10*3/uL (ref 150–400)
RBC: 4.69 MIL/uL (ref 4.22–5.81)
RDW: 14.1 % (ref 11.5–15.5)
WBC: 13.7 10*3/uL — ABNORMAL HIGH (ref 4.0–10.5)

## 2018-02-17 LAB — BASIC METABOLIC PANEL
Anion gap: 9 (ref 5–15)
BUN: 10 mg/dL (ref 6–20)
CO2: 31 mmol/L (ref 22–32)
Calcium: 8.8 mg/dL — ABNORMAL LOW (ref 8.9–10.3)
Chloride: 100 mmol/L — ABNORMAL LOW (ref 101–111)
Creatinine, Ser: 1.06 mg/dL (ref 0.61–1.24)
GFR calc Af Amer: >60 mL/min (ref >60)
GFR calc non Af Amer: >60 mL/min (ref >60)
Glucose, Bld: 106 mg/dL — ABNORMAL HIGH (ref 65–99)
Potassium: 4.2 mmol/L (ref 3.5–5.1)
Sodium: 140 mmol/L (ref 135–145)

## 2018-02-17 LAB — HIV ANTIBODY (ROUTINE TESTING W REFLEX): HIV Screen 4th Generation wRfx: NONREACTIVE

## 2018-02-17 MED ORDER — IBUPROFEN 600 MG PO TABS
600.0000 mg | ORAL_TABLET | Freq: Four times a day (QID) | ORAL | Status: DC | PRN
  Administered 2018-02-17 – 2018-02-19 (×3): 600 mg via ORAL
  Filled 2018-02-17 (×3): qty 1

## 2018-02-17 NOTE — ED Notes (Signed)
Attempted report 

## 2018-02-17 NOTE — Progress Notes (Signed)
PROGRESS NOTE    Larry [name]  ZTI:[id number] DOB: 1951/09/18 DOA: 02/16/2018 PCP: Merrilee Seashore, MD    Brief Narrative:  67 y.o. male with medical history significant for hypertension, hypothyroidism, osteoarthritis with chronic pain, and degenerative disc disease status post lumbar fusion last month, now presenting to the emergency department with fevers, chills, and abscess involving the lower left leg.  Patient reports that he was recovering well from his recent low back surgery when he accidentally kicked his bed frame with left shin ~4 days ago; he suffered an abrasion. He has continued to bear weight on the left leg without significant pain but noted increasing swelling and surrounding erythema, and then an abscess yesterday. He reports fevers for the past 2 days.   ED Course: Upon arrival to the ED, patient is found to be febrile to 39.1 C, slightly tachycardic, and with vitals otherwise normal.  Chest x-ray is negative for acute cardiopulmonary disease and radiographs of the left lower leg are negative for underlying osseous abnormality.  Chemistry panel is unremarkable and CBC features a leukocytosis to 12,000.  Lactic acid is reassuringly normal.  I&D was performed in the emergency department, blood and wound cultures were sent, 1 L of normal saline administered, Tdap updated, and patient was started on empiric vancomycin and Zosyn.  He remains hemodynamically stable and will be admitted for ongoing evaluation and management of sepsis secondary to skin/soft tissue infection.  Assessment & Plan:   Principal Problem:   Sepsis due to skin infection [facility]) Active Problems:   Hypothyroidism   Essential hypertension   Chronic pain  1. Sepsis secondary to skin/soft tissue infection  - Presents with abscess involving lower left leg and fevers  - Found to be febrile with leukocytosis and lower left leg abscess with surrounding cellulitis  - Plain films negative for underlying  osseous abnormality   - I&D was performed in ED, wound and blood cultures sent -Patient has been continued on vanc  2. Hypertension  - BP currently stable -Cont current regimen  3. Hypothyroidism  - Continue Synthroid as tolerated   4. Chronic low back pain  - Status-post lumbar fusion last month; reports progressing as expected without complication - Continue home regimen with prn Norco, Dilaudid, and Valium as tolerated   DVT prophylaxis: Lovenox subQ Code Status: Full Family Communication: Pt in room, family not at bedside Disposition Plan: Uncertain at this time  Consultants:     Procedures:   I/D of wound in ED  Antimicrobials: Anti-infectives (From admission, onward)   Start     Dose/Rate Route Frequency Ordered Stop   02/17/18 1100  vancomycin (VANCOCIN) IVPB 1000 mg/200 mL premix     1,000 mg 200 mL/hr over 60 Minutes Intravenous Every 12 hours 02/16/18 2206     02/16/18 2215  vancomycin (VANCOCIN) 1,500 mg in sodium chloride 0.9 % 500 mL IVPB     1,500 mg 250 mL/hr over 120 Minutes Intravenous  Once 02/16/18 2205 02/17/18 0105   02/16/18 2015  vancomycin (VANCOCIN) IVPB 1000 mg/200 mL premix  Status:  Discontinued     1,000 mg 200 mL/hr over 60 Minutes Intravenous  Once 02/16/18 2007 02/16/18 2205   02/16/18 2015  piperacillin-tazobactam (ZOSYN) IVPB 3.375 g     3.375 g 100 mL/hr over 30 Minutes Intravenous  Once 02/16/18 2007 02/16/18 2256       Subjective: Questioning about going home  Objective: Vitals:   02/17/18 1401 02/17/18 1412 02/17/18 1441 02/17/18 1451  BP: Marland Kitchen)  160/88  133/61   Pulse: 82  97   Resp: (!) 24     Temp:  97.6 F (36.4 C) 98.1 F (36.7 C) 100.2 F (37.9 C)  TempSrc:  Oral Oral Rectal  SpO2: 99%  93%   Weight:      Height:        Intake/Output Summary (Last 24 hours) at 02/17/2018 1731 Last data filed at 02/17/2018 1327 Gross per 24 hour  Intake 1700 ml  Output -  Net 1700 ml   Filed Weights   02/17/18 0900    Weight: 89.8 kg (198 lb)    Examination:  General exam: Appears calm and comfortable  Respiratory system: Clear to auscultation. Respiratory effort normal. Cardiovascular system: S1 & S2 heard, RRR Gastrointestinal system: Abdomen is nondistended, soft and nontender. No organomegaly or masses felt. Normal bowel sounds heard. Central nervous system: Alert and oriented. No focal neurological deficits. Extremities: Symmetric 5 x 5 power. Skin: L shin with open non-draining lesion with surrounding erythema Psychiatry: Judgement and insight appear normal. Mood & affect appropriate.   Data Reviewed: I have personally reviewed following labs and imaging studies  CBC: Recent Labs  Lab 02/16/18 2012 02/17/18 0549  WBC 12.0* 13.7*  NEUTROABS 10.8* 12.3*  HGB 14.1 14.9  HCT 42.9 46.4  MCV 96.4 98.9  PLT 171 191   Basic Metabolic Panel: Recent Labs  Lab 02/16/18 2012 02/17/18 0549  NA 137 140  K 3.8 4.2  CL 98* 100*  CO2 28 31  GLUCOSE 111* 106*  BUN 13 10  CREATININE 1.01 1.06  CALCIUM 8.9 8.8*   GFR: Estimated Creatinine Clearance: 72.9 mL/min (by C-G formula based on SCr of 1.06 mg/dL). Liver Function Tests: Recent Labs  Lab 02/16/18 2012  AST 29  ALT 23  ALKPHOS 75  BILITOT 1.5*  PROT 6.7  ALBUMIN 4.1   No results for input(s): LIPASE, AMYLASE in the last 168 hours. No results for input(s): AMMONIA in the last 168 hours. Coagulation Profile: No results for input(s): INR, PROTIME in the last 168 hours. Cardiac Enzymes: No results for input(s): CKTOTAL, CKMB, CKMBINDEX, TROPONINI in the last 168 hours. BNP (last 3 results) No results for input(s): PROBNP in the last 8760 hours. HbA1C: No results for input(s): HGBA1C in the last 72 hours. CBG: No results for input(s): GLUCAP in the last 168 hours. Lipid Profile: No results for input(s): CHOL, HDL, LDLCALC, TRIG, CHOLHDL, LDLDIRECT in the last 72 hours. Thyroid Function Tests: No results for input(s): TSH,  T4TOTAL, FREET4, T3FREE, THYROIDAB in the last 72 hours. Anemia Panel: No results for input(s): VITAMINB12, FOLATE, FERRITIN, TIBC, IRON, RETICCTPCT in the last 72 hours. Sepsis Labs: Recent Labs  Lab 02/16/18 2034  LATICACIDVEN 1.41    Recent Results (from the past 240 hour(s))  Wound or Superficial Culture     Status: None (Preliminary result)   Collection Time: 02/16/18  8:15 PM  Result Value Ref Range Status   Specimen Description WOUND LEFT LOWER LEG  Final   Special Requests NONE  Final   Gram Stain   Final    RARE WBC PRESENT,BOTH PMN AND MONONUCLEAR MODERATE GRAM POSITIVE COCCI    Culture   Final    TOO YOUNG TO READ Performed at [facility], [address] [place], [place] [address]    Report Status PENDING  Incomplete  Blood culture (routine x 2)     Status: None (Preliminary result)   Collection Time: 02/16/18  8:25  PM  Result Value Ref Range Status   Specimen Description BLOOD RIGHT ANTECUBITAL  Final   Special Requests   Final    BOTTLES DRAWN AEROBIC AND ANAEROBIC Blood Culture adequate volume   Culture   Final    NO GROWTH < 24 HOURS Performed at [facility], [address], [place], [place] [address]    Report Status PENDING  Incomplete  Blood culture (routine x 2)     Status: None (Preliminary result)   Collection Time: 02/16/18 10:27 PM  Result Value Ref Range Status   Specimen Description BLOOD LEFT ANTECUBITAL  Final   Special Requests   Final    BOTTLES DRAWN AEROBIC AND ANAEROBIC Blood Culture adequate volume   Culture   Final    NO GROWTH < 24 HOURS Performed at [facility], [place] [address], [place], [place] [address]    Report Status PENDING  Incomplete     Radiology Studies: Dg Chest 2 View  Result Date: 02/16/2018 CLINICAL DATA:  Fever EXAM: CHEST - 2 VIEW COMPARISON:  03/18/2016 FINDINGS: Postsurgical changes of the cervical spine. Mild cardiomegaly. No acute airspace disease or pleural effusion. Degenerative changes  of the spine. Ovoid opacity anteriorly on the lateral view may reflect prominent cartilaginous calcification or an artifact. Metallic clip left lateral lower chest wall. IMPRESSION: No active cardiopulmonary disease.  Mild cardiomegaly. Electronically Signed   By: Donavan Foil M.D.   On: 02/16/2018 21:22   Dg Tibia/fibula Left  Result Date: 02/16/2018 CLINICAL DATA:  Cellulitis EXAM: LEFT TIBIA AND FIBULA - 2 VIEW COMPARISON:  None. FINDINGS: No acute displaced fracture or malalignment. No periostitis or bone destruction. Diffuse soft tissue edema. No foreign body or soft tissue gas. IMPRESSION: No acute osseous abnormality Electronically Signed   By: Donavan Foil M.D.   On: 02/16/2018 21:23    Scheduled Meds: . aspirin EC  81 mg Oral Daily  . enoxaparin (LOVENOX) injection  40 mg Subcutaneous Daily  . latanoprost  1 drop Both Eyes QHS  . levothyroxine  150 mcg Oral QAC breakfast  . LORazepam  1 mg Oral QHS  . tamsulosin  0.4 mg Oral Daily  . Tdap  0.5 mL Intramuscular Once  . verapamil  180 mg Oral Daily   Continuous Infusions: . vancomycin Stopped (02/17/18 1327)     LOS: 1 day   Marylu Lund, MD [facility] Pager [phone]  If 7PM-7AM, please contact night-coverage [email or web address] Password [facility] 02/17/2018, 5:31 PM

## 2018-02-17 NOTE — ED Notes (Signed)
Paged admitting about rectal temp 104.1

## 2018-02-17 NOTE — Progress Notes (Signed)
Larry [name] [id number] Admission Data: 02/17/2018 4:04 PM Attending Provider: Donne Hazel, MD  Larry [name], Larry Kaufmann, MD Consults/ Treatment Team:   Larry [name] is a 67 y.o. male patient admitted from ED awake, alert  & orientated  X 3,  Full Code, VSS - Blood pressure 133/61, pulse 97, temperature 100.2 F (37.9 C), temperature source Rectal, resp. rate (!) 24, height 5\' 6"  (1.676 m), weight 89.8 kg (198 lb), SpO2 93 %., no c/o shortness of breath, no c/o chest pain, no distress noted.    IV site WDL:  antecubital left, condition patent and no redness with a transparent dsg that's clean dry and intact.  Allergies:  No Known Allergies   Past Medical History:  Diagnosis Date  . Arthritis    "knees; left shoulder" (04/12/2013)  . Asthma    "as a child" (04/12/2013)  . DDD (degenerative disc disease), cervical   . DDD (degenerative disc disease), lumbar   . Difficult intubation    2012   CERVICAL FUSION   . Headache(784.0)    VERAPAMIL FOR PREVENTION   . 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 History of chicken pox    as an adult  . Hypoglycemia    last episode 1 yr. ago, just gets jittery  . Hypothyroidism   . Lumbar stenosis   . OSA (obstructive sleep apnea)    "haven't wore mask in years; had OR; still snore" (04/12/2013)  . Urinary frequency     History:  obtained from the patient. Tobacco/alcohol: denied social drinker  Pt orientation to unit, room and routine. Information packet given to patient/family and safety video watched.  Admission INP armband ID verified with patient/family, and in place. SR up x 2, fall risk assessment complete with Patient and family verbalizing understanding of risks associated with falls. Pt verbalizes an understanding of how to use the call bell and to call for help before getting out of bed.  Skin, clean-dry- intact without evidence of  bruising, or skin tears.   Cellulitis with open abscess noted to left shin.     Will cont to monitor and assist as needed.  Larry N Briyah Wheelwright, RN 02/17/2018 4:04 PM

## 2018-02-18 DIAGNOSIS — N390 Urinary tract infection, site not specified: Secondary | ICD-10-CM

## 2018-02-18 DIAGNOSIS — B9620 Unspecified Escherichia coli [E. coli] as the cause of diseases classified elsewhere: Secondary | ICD-10-CM

## 2018-02-18 LAB — CBC
HCT: 40.2 % (ref 39.0–52.0)
Hemoglobin: 13.3 g/dL (ref 13.0–17.0)
MCH: 31.8 pg (ref 26.0–34.0)
MCHC: 33.1 g/dL (ref 30.0–36.0)
MCV: 96.2 fL (ref 78.0–100.0)
Platelets: 120 10*3/uL — ABNORMAL LOW (ref 150–400)
RBC: 4.18 MIL/uL — ABNORMAL LOW (ref 4.22–5.81)
RDW: 13.8 % (ref 11.5–15.5)
WBC: 12.2 10*3/uL — ABNORMAL HIGH (ref 4.0–10.5)

## 2018-02-18 LAB — BASIC METABOLIC PANEL
Anion gap: 10 (ref 5–15)
BUN: 12 mg/dL (ref 6–20)
CO2: 27 mmol/L (ref 22–32)
Calcium: 8.3 mg/dL — ABNORMAL LOW (ref 8.9–10.3)
Chloride: 101 mmol/L (ref 101–111)
Creatinine, Ser: 0.94 mg/dL (ref 0.61–1.24)
GFR calc Af Amer: >60 mL/min (ref >60)
GFR calc non Af Amer: >60 mL/min (ref >60)
Glucose, Bld: 121 mg/dL — ABNORMAL HIGH (ref 65–99)
Potassium: 3.8 mmol/L (ref 3.5–5.1)
Sodium: 138 mmol/L (ref 135–145)

## 2018-02-18 MED ORDER — SULFAMETHOXAZOLE-TRIMETHOPRIM 800-160 MG PO TABS
1.0000 | ORAL_TABLET | Freq: Two times a day (BID) | ORAL | Status: DC
Start: 2018-02-18 — End: 2018-02-19
  Administered 2018-02-18 – 2018-02-19 (×3): 1 via ORAL
  Filled 2018-02-18 (×3): qty 1

## 2018-02-18 NOTE — Progress Notes (Signed)
PROGRESS NOTE    Larry [name]  YKZ:[id number] DOB: [birth year] DOA: 02/16/2018 PCP: Merrilee Seashore, MD    Brief Narrative:  67 y.o. male with medical history significant for hypertension, hypothyroidism, osteoarthritis with chronic pain, and degenerative disc disease status post lumbar fusion last month, now presenting to the emergency department with fevers, chills, and abscess involving the lower left leg.  Patient reports that he was recovering well from his recent low back surgery when he accidentally kicked his bed frame with left shin ~4 days ago; he suffered an abrasion. He has continued to bear weight on the left leg without significant pain but noted increasing swelling and surrounding erythema, and then an abscess yesterday. He reports fevers for the past 2 days.   ED Course: Upon arrival to the ED, patient is found to be febrile to 39.1 C, slightly tachycardic, and with vitals otherwise normal.  Chest x-ray is negative for acute cardiopulmonary disease and radiographs of the left lower leg are negative for underlying osseous abnormality.  Chemistry panel is unremarkable and CBC features a leukocytosis to 12,000.  Lactic acid is reassuringly normal.  I&D was performed in the emergency department, blood and wound cultures were sent, 1 L of normal saline administered, Tdap updated, and patient was started on empiric vancomycin and Zosyn.  He remains hemodynamically stable and will be admitted for ongoing evaluation and management of sepsis secondary to skin/soft tissue infection.  Assessment & Plan:   Principal Problem:   Sepsis due to skin infection [facility]) Active Problems:   Hypothyroidism   Essential hypertension   Chronic pain  1. Sepsis secondary to skin/soft tissue infection  - Presents with abscess involving lower left leg and fevers  - Found to be febrile with leukocytosis and lower left leg abscess with surrounding cellulitis  - Plain films negative for underlying  osseous abnormality   - I&D was performed in ED, wound and blood cultures sent -Patient had been continued on vanc -Will transition to bactrim  2. Hypertension  - BP currently stable - Continue to monitor  3. Hypothyroidism  - Will continue with Synthroid as tolerated   4. Chronic low back pain  - Status-post lumbar fusion last month; reports progressing as expected without complication - Continue home regimen with prn Norco, Dilaudid, and Valium as patient tolerates  5. E.coli UTI - Urine cx pos for ecoli. Urinalysis suggestive of UTI - Have transitioned pt to PO bactrim per above - Follow culture sensitivities  6. Recent lumbar surgery - Followed by Dr. Ellene Route - Wound evaluated, appears to be healing - Have placed new dressing   DVT prophylaxis: Lovenox subQ Code Status: Full Family Communication: Pt in room, family not at bedside Disposition Plan: Possible d/c in 24hrs  Consultants:     Procedures:   I/D of wound in ED  Antimicrobials: Anti-infectives (From admission, onward)   Start     Dose/Rate Route Frequency Ordered Stop   02/18/18 1415  sulfamethoxazole-trimethoprim (BACTRIM DS,SEPTRA DS) 800-160 MG per tablet 1 tablet     1 tablet Oral Every 12 hours 02/18/18 1400     02/17/18 1100  vancomycin (VANCOCIN) IVPB 1000 mg/200 mL premix  Status:  Discontinued     1,000 mg 200 mL/hr over 60 Minutes Intravenous Every 12 hours 02/16/18 2206 02/18/18 1400   02/16/18 2215  vancomycin (VANCOCIN) 1,500 mg in sodium chloride 0.9 % 500 mL IVPB     1,500 mg 250 mL/hr over 120 Minutes Intravenous  Once 02/16/18 2205  02/17/18 0105   02/16/18 2015  vancomycin (VANCOCIN) IVPB 1000 mg/200 mL premix  Status:  Discontinued     1,000 mg 200 mL/hr over 60 Minutes Intravenous  Once 02/16/18 2007 02/16/18 2205   02/16/18 2015  piperacillin-tazobactam (ZOSYN) IVPB 3.375 g     3.375 g 100 mL/hr over 30 Minutes Intravenous  Once 02/16/18 2007 02/16/18 2256       Subjective: Feeling better. Eager to go home  Objective: Vitals:   02/17/18 2139 02/18/18 0550 02/18/18 0613 02/18/18 1434  BP: 102/62 (!) 86/50 116/63 (!) 155/67  Pulse: 87 78  90  Resp: 20 18  17   Temp: 98.1 F (36.7 C) 98.9 F (37.2 C)  99.1 F (37.3 C)  TempSrc: Oral Oral  Oral  SpO2: 97% 92%  95%  Weight:      Height:        Intake/Output Summary (Last 24 hours) at 02/18/2018 1747 Last data filed at 02/18/2018 1539 Gross per 24 hour  Intake 920 ml  Output 400 ml  Net 520 ml   Filed Weights   02/17/18 0900  Weight: 89.8 kg (198 lb)    Examination: General exam: Awake, laying in bed, in nad Respiratory system: Normal respiratory effort, no wheezing Cardiovascular system: regular rate, s1, s2 Gastrointestinal system: Soft, nondistended, positive BS Central nervous system: CN2-12 grossly intact, strength intact Extremities: Perfused, no clubbing Skin: Normal skin turgor, no notable skin lesions seen Psychiatry: Mood normal // no visual hallucinations   Data Reviewed: I have personally reviewed following labs and imaging studies  CBC: Recent Labs  Lab 02/16/18 2012 02/17/18 0549 02/18/18 0632  WBC 12.0* 13.7* 12.2*  NEUTROABS 10.8* 12.3*  --   HGB 14.1 14.9 13.3  HCT 42.9 46.4 40.2  MCV 96.4 98.9 96.2  PLT 171 162 734*   Basic Metabolic Panel: Recent Labs  Lab 02/16/18 2012 02/17/18 0549 02/18/18 0632  NA 137 140 138  K 3.8 4.2 3.8  CL 98* 100* 101  CO2 28 31 27   GLUCOSE 111* 106* 121*  BUN 13 10 12   CREATININE 1.01 1.06 0.94  CALCIUM 8.9 8.8* 8.3*   GFR: Estimated Creatinine Clearance: 82.2 mL/min (by C-G formula based on SCr of 0.94 mg/dL). Liver Function Tests: Recent Labs  Lab 02/16/18 2012  AST 29  ALT 23  ALKPHOS 75  BILITOT 1.5*  PROT 6.7  ALBUMIN 4.1   No results for input(s): LIPASE, AMYLASE in the last 168 hours. No results for input(s): AMMONIA in the last 168 hours. Coagulation Profile: No results for input(s):  INR, PROTIME in the last 168 hours. Cardiac Enzymes: No results for input(s): CKTOTAL, CKMB, CKMBINDEX, TROPONINI in the last 168 hours. BNP (last 3 results) No results for input(s): PROBNP in the last 8760 hours. HbA1C: No results for input(s): HGBA1C in the last 72 hours. CBG: No results for input(s): GLUCAP in the last 168 hours. Lipid Profile: No results for input(s): CHOL, HDL, LDLCALC, TRIG, CHOLHDL, LDLDIRECT in the last 72 hours. Thyroid Function Tests: No results for input(s): TSH, T4TOTAL, FREET4, T3FREE, THYROIDAB in the last 72 hours. Anemia Panel: No results for input(s): VITAMINB12, FOLATE, FERRITIN, TIBC, IRON, RETICCTPCT in the last 72 hours. Sepsis Labs: Recent Labs  Lab 02/16/18 2034  LATICACIDVEN 1.41    Recent Results (from the past 240 hour(s))  Wound or Superficial Culture     Status: None (Preliminary result)   Collection Time: 02/16/18  8:15 PM  Result Value Ref Range Status  Specimen Description WOUND LEFT LOWER LEG  Final   Special Requests NONE  Final   Gram Stain   Final    RARE WBC PRESENT,BOTH PMN AND MONONUCLEAR MODERATE GRAM POSITIVE COCCI Performed at [facility], [place] [address], [place], [place] [address]    Culture FEW STAPHYLOCOCCUS AUREUS  Final   Report Status PENDING  Incomplete  Blood culture (routine x 2)     Status: None (Preliminary result)   Collection Time: 02/16/18  8:25 PM  Result Value Ref Range Status   Specimen Description BLOOD RIGHT ANTECUBITAL  Final   Special Requests   Final    BOTTLES DRAWN AEROBIC AND ANAEROBIC Blood Culture adequate volume   Culture   Final    NO GROWTH 2 DAYS Performed at [facility], [place] [address], [place], [place] [address]    Report Status PENDING  Incomplete  Blood culture (routine x 2)     Status: None (Preliminary result)   Collection Time: 02/16/18 10:27 PM  Result Value Ref Range Status   Specimen Description BLOOD LEFT ANTECUBITAL  Final   Special Requests   Final     BOTTLES DRAWN AEROBIC AND ANAEROBIC Blood Culture adequate volume   Culture   Final    NO GROWTH 2 DAYS Performed at [facility], [place] [address] [place], [place] [address]    Report Status PENDING  Incomplete  Urine culture     Status: Abnormal (Preliminary result)   Collection Time: 02/16/18 11:22 PM  Result Value Ref Range Status   Specimen Description URINE, CLEAN CATCH  Final   Special Requests NONE  Final   Culture (A)  Final    >=100,000 COLONIES/mL ESCHERICHIA COLI SUSCEPTIBILITIES TO FOLLOW Performed at [facility], [address], [place], [place] [address]    Report Status PENDING  Incomplete     Radiology Studies: Dg Chest 2 View  Result Date: 02/16/2018 CLINICAL DATA:  Fever EXAM: CHEST - 2 VIEW COMPARISON:  03/18/2016 FINDINGS: Postsurgical changes of the cervical spine. Mild cardiomegaly. No acute airspace disease or pleural effusion. Degenerative changes of the spine. Ovoid opacity anteriorly on the lateral view may reflect prominent cartilaginous calcification or an artifact. Metallic clip left lateral lower chest wall. IMPRESSION: No active cardiopulmonary disease.  Mild cardiomegaly. Electronically Signed   By: Donavan Foil M.D.   On: 02/16/2018 21:22   Dg Tibia/fibula Left  Result Date: 02/16/2018 CLINICAL DATA:  Cellulitis EXAM: LEFT TIBIA AND FIBULA - 2 VIEW COMPARISON:  None. FINDINGS: No acute displaced fracture or malalignment. No periostitis or bone destruction. Diffuse soft tissue edema. No foreign body or soft tissue gas. IMPRESSION: No acute osseous abnormality Electronically Signed   By: Donavan Foil M.D.   On: 02/16/2018 21:23    Scheduled Meds: . aspirin EC  81 mg Oral Daily  . enoxaparin (LOVENOX) injection  40 mg Subcutaneous Daily  . latanoprost  1 drop Both Eyes QHS  . levothyroxine  150 mcg Oral QAC breakfast  . LORazepam  1 mg Oral QHS  . sulfamethoxazole-trimethoprim  1 tablet Oral Q12H  . tamsulosin  0.4 mg Oral Daily  .  Tdap  0.5 mL Intramuscular Once  . verapamil  180 mg Oral Daily   Continuous Infusions:    LOS: 2 days   Marylu Lund, MD [facility] Pager [phone]  If 7PM-7AM, please contact night-coverage [email or web address] Password [facility] 02/18/2018, 5:47 PM

## 2018-02-18 NOTE — Evaluation (Signed)
Physical Therapy Evaluation Patient Details Name: HAMLET [name] MRN: [id number] DOB: 02/25/1951 Today's Date: 02/18/2018   History of Present Illness  Larry [name] is a 67 y.o. male with medical history significant for hypertension, hypothyroidism, osteoarthritis with chronic pain, and degenerative disc disease status post lumbar fusion last month, now presenting to the emergency department with fevers, chills, and abscess involving the lower left leg.  Patient reports that he was recovering well from his recent low back surgery when he accidentally kicked his bed frame with left shin ~4 days ago; he suffered an abrasion. He has continued to bear weight on the left leg without significant pain but noted increasing swelling and surrounding erythema, and then an abscess yesterday. He reports fevers for the past 2 days. Patient reports 3 falls since LS surgery. RT TKA; 2 Lt knee scopes.   Clinical Impression   Pt admitted with above diagnosis. Pt currently with functional limitations due to the deficits listed below (see PT Problem List). Patient wearing Aspen lumbar corsett. Modified independent for bed mobility and transfers with some difficulty generating enough strength for motion. Good body mechanics noted during bed mobility and transfers, however, reports 3 falls since LS surgery 3 weeks ago. Ambulated 180 feet with RW with supervision exhibiting fair standing balance and endurance. Pt will benefit from skilled PT to increase their independence and safety with mobility to allow discharge to the venue listed below.       Follow Up Recommendations Outpatient PT;Supervision for mobility/OOB    Equipment Recommendations  None recommended by PT    Recommendations for Other Services       Precautions / Restrictions Precautions Precautions: Back Required Braces or Orthoses: Spinal Brace Spinal Brace: Lumbar corset;Applied in standing position Restrictions Weight Bearing Restrictions:  No      Mobility  Bed Mobility Overal bed mobility: Modified Independent                Transfers Overall transfer level: Modified independent Equipment used: Rolling walker (2 wheeled)                Ambulation/Gait Ambulation/Gait assistance: Supervision Ambulation Distance (Feet): 180 Feet Assistive device: Rolling walker (2 wheeled) Gait Pattern/deviations: Step-through pattern;Decreased step length - right;Decreased step length - left;Decreased stride length Gait velocity: decreased   General Gait Details: Good posture during ambulation, good weight bearing through UEs on RW; safety aware  Stairs            Wheelchair Mobility    Modified Rankin (Stroke Patients Only)       Balance Overall balance assessment: Needs assistance Sitting-balance support: No upper extremity supported;Feet supported Sitting balance-Leahy Scale: Good     Standing balance support: Bilateral upper extremity supported;During functional activity Standing balance-Leahy Scale: Fair                               Pertinent Vitals/Pain Pain Assessment: Faces Faces Pain Scale: Hurts a little bit Pain Location: Lt shin wound area Pain Descriptors / Indicators: Sore    Home Living Family/patient expects to be discharged to:: Private residence Living Arrangements: Alone Available Help at Discharge: Family Type of Home: House Home Access: Stairs to enter Entrance Stairs-Rails: Psychiatric nurse of Steps: 3 Home Layout: One level Home Equipment: Environmental consultant - 2 wheels;Cane - single point;Wheelchair - manual;Walker - 4 wheels;Grab bars - tub/shower;Grab bars - toilet;Shower seat Additional Comments: pt is divorced and lives alone, 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     Prior Function Level of Independence: Independent with assistive device(s)         Comments: RW     Hand Dominance        Extremity/Trunk Assessment    Upper Extremity Assessment Upper Extremity Assessment: Overall WFL for tasks assessed    Lower Extremity Assessment Lower Extremity Assessment: Overall WFL for tasks assessed    Cervical / Trunk Assessment Cervical / Trunk Assessment: Normal  Communication   Communication: No difficulties  Cognition Arousal/Alertness: Awake/alert Behavior During Therapy: WFL for tasks assessed/performed Overall Cognitive Status: Within Functional Limits for tasks assessed                                        General Comments      Exercises     Assessment/Plan    PT Assessment Patient needs continued PT services  PT Problem List Decreased balance;Decreased safety awareness;Decreased mobility;Decreased activity tolerance       PT Treatment Interventions DME instruction;Therapeutic activities;Gait training;Therapeutic exercise;Patient/family education;Functional mobility training;Balance training    PT Goals (Current goals can be found in the Care Plan section)  Acute Rehab PT Goals Patient Stated Goal: Get back to work PT Goal Formulation: With patient Time For Goal Achievement: 03/04/18 Potential to Achieve Goals: Good    Frequency Min 3X/week   Barriers to discharge        Co-evaluation               AM-PAC PT "6 Clicks" Daily Activity  Outcome Measure Difficulty turning over in bed (including adjusting bedclothes, sheets and blankets)?: A Little Difficulty moving from lying on back to sitting on the side of the bed? : A Little Difficulty sitting down on and standing up from a chair with arms (e.g., wheelchair, bedside commode, etc,.)?: A Little Help needed moving to and from a bed to chair (including a wheelchair)?: A Little Help needed walking in hospital room?: A Little Help needed climbing 3-5 steps with a railing? : A Little 6 Click Score: 18    End of Session Equipment Utilized During Treatment: Gait belt;Back brace Activity Tolerance: Patient  tolerated treatment well Patient left: in bed;with call bell/phone within reach Nurse Communication: Mobility status PT Visit Diagnosis: Unsteadiness on feet (R26.81);Repeated falls (R29.6);Difficulty in walking, not elsewhere classified (R26.2)    Time: 1230-1300 PT Time Calculation (min) (ACUTE ONLY): 30 min   Charges:   PT Evaluation $PT Eval Moderate Complexity: 1 Mod PT Treatments $Gait Training: 8-22 mins   PT G Codes:        Geovanna Simko D. Hartnett-Rands, MS, PT Per [place] [phone] 02/18/2018, 1:12 PM

## 2018-02-18 NOTE — Progress Notes (Signed)
Per MD recommendation, honeycomb dressing was removed due to old dry drainage. Skin intact, clean, dry, no erythema, no fresh drainage, no edema. Applied back transparent dressing. Will continue to monitor.

## 2018-02-18 NOTE — Evaluation (Signed)
Occupational Therapy Evaluation Patient Details Name: Larry [name] MRN: [id number] DOB: 02-25-1951 Today's Date: 02/18/2018    History of Present Illness Larry [name] is a 67 y.o. male with medical history significant for hypertension, hypothyroidism, osteoarthritis with chronic pain, and degenerative disc disease status ost lumbar fusion last month, now presenting to the emergency department with fevers, chills, and abscess involving the lower left leg.  Patient reports that he was recovering well from his recent low back surgery when he accidentally kicked his bed frame with left shin ~4 days ago; he suffered an abrasion. He has continued to bear weight on the left leg without significant pain but noted increasing swelling and surrounding erythema, and then an abscess yesterday. He reports fevers for the past 2 days. s/p L3-ilieum hardware revision 3 weeks ago; s/p Rt TKA, s/p 2 Lt kene scopes. Patient reports 3 falls since LS surgery.   Clinical Impression   This 67 y/o male presents with the above. Pt reports using SPC/RW for functional mobility, reports mod independence with ADL completion. Pt completing room level functional mobility using RW, toileting and standing grooming ADLs with minguard assist throughout; currently requires minA for LB ADLs. Pt also requiring mod cues for maintaining back precautions during functional tasks this session. Reviewed back precautions and safety during ADLs while maintaining precautions with pt verbalizing understanding. Pt will benefit from continued OT services in both acute and post acute setting to maximize his overall safety and independence with ADLs and mobility after return home.     Follow Up Recommendations  Outpatient OT;Supervision/Assistance - 24 hour(24hr initially )    Equipment Recommendations  3 in 1 bedside commode           Precautions / Restrictions Precautions Precautions: Back Precaution Comments: pt able to verbalize  2/3 back precautions, requires mod cues to recall third back precaution (twisitng)  Required Braces or Orthoses: Spinal Brace Spinal Brace: Lumbar corset;Applied in standing position Restrictions Weight Bearing Restrictions: No      Mobility Bed Mobility Overal bed mobility: Modified Independent             General bed mobility comments: sitting EOB upon entering room   Transfers Overall transfer level: Needs assistance Equipment used: Rolling walker (2 wheeled) Transfers: Sit to/from Stand Sit to Stand: Supervision         General transfer comment: supervision for safety     Balance Overall balance assessment: Needs assistance Sitting-balance support: No upper extremity supported;Feet supported Sitting balance-Leahy Scale: Good     Standing balance support: Bilateral upper extremity supported;During functional activity;No upper extremity supported Standing balance-Leahy Scale: Fair Standing balance comment: able to static stand without UE support and minguard for safety                            ADL either performed or assessed with clinical judgement   ADL Overall ADL's : Needs assistance/impaired Eating/Feeding: Modified independent;Sitting   Grooming: Wash/dry hands;Min guard;Standing   Upper Body Bathing: Min guard;Sitting   Lower Body Bathing: Min guard;Sit to/from stand;Cueing for back precautions   Upper Body Dressing : Set up;Sitting Upper Body Dressing Details (indicate cue type and reason): setup assist provided to don lumbar corsett  Lower Body Dressing: Minimal assistance;Sit to/from stand Lower Body Dressing Details (indicate cue type and reason): pt able to verbalize proper technique for using AE to don LB clothing Toilet Transfer: Min guard;Ambulation;Regular Toilet;Grab bars;RW Armed forces technical officer Details (indicate  cue type and reason): increased effort for sit<>stand, no physical assist needed, minguard for safety  Toileting- Clothing  Manipulation and Hygiene: Min guard;Sit to/from stand       Functional mobility during ADLs: Min guard;Rolling walker General ADL Comments: pt requires mod cues to adhere to back precautions during ADLs; reviewed back precautions and importance of adherence during functional tasks                          Pertinent Vitals/Pain Pain Assessment: Faces Faces Pain Scale: Hurts a little bit Pain Location: Lt shin wound area Pain Descriptors / Indicators: Sore Pain Intervention(s): Monitored during session     Hand Dominance Right   Extremity/Trunk Assessment Upper Extremity Assessment Upper Extremity Assessment: Overall WFL for tasks assessed   Lower Extremity Assessment Lower Extremity Assessment: Defer to PT evaluation   Cervical / Trunk Assessment Cervical / Trunk Assessment: Normal   Communication Communication Communication: No difficulties   Cognition Arousal/Alertness: Awake/alert Behavior During Therapy: WFL for tasks assessed/performed Overall Cognitive Status: Within Functional Limits for tasks assessed                                                      Home Living Family/patient expects to be discharged to:: Private residence Living Arrangements: Alone Available Help at Discharge: Family Type of Home: House Home Access: Stairs to enter Technical brewer of Steps: 3 Entrance Stairs-Rails: Right;Left Home Layout: One level     Bathroom Shower/Tub: Occupational psychologist: Handicapped height Bathroom Accessibility: Yes   Home Equipment: Environmental consultant - 2 wheels;Cane - single point;Wheelchair - manual;Walker - 4 wheels;Grab bars - tub/shower;Grab bars - toilet;Adaptive equipment Adaptive Equipment: Reacher;Sock aid;Long-handled shoe horn Additional Comments: pt is divorced and lives alone, 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; pt's and his ex-wife are still friends and cordial, she was present at start of  session       Prior Functioning/Environment Level of Independence: Independent with assistive device(s)        Comments: RW        OT Problem List: Decreased strength;Decreased knowledge of precautions;Decreased activity tolerance;Impaired balance (sitting and/or standing)      OT Treatment/Interventions: Self-care/ADL training;DME and/or AE instruction;Therapeutic activities;Balance training;Energy conservation;Patient/family education    OT Goals(Current goals can be found in the care plan section) Acute Rehab OT Goals Patient Stated Goal: Get back to work OT Goal Formulation: With patient Time For Goal Achievement: 03/04/18 Potential to Achieve Goals: Good  OT Frequency: Min 2X/week                             AM-PAC PT "6 Clicks" Daily Activity     Outcome Measure Help from another person eating meals?: None Help from another person taking care of personal grooming?: A Little Help from another person toileting, which includes using toliet, bedpan, or urinal?: A Little Help from another person bathing (including washing, rinsing, drying)?: A Little Help from another person to put on and taking off regular upper body clothing?: A Little Help from another person to put on and taking off regular lower body clothing?: A Little 6 Click Score: 19   End of Session Equipment Utilized During Treatment: Rolling walker;Back brace Nurse Communication: Mobility  status;Other (comment)(pt sitting EOB )  Activity Tolerance: Patient tolerated treatment well Patient left: with call bell/phone within reach;with family/visitor present;with bed alarm set;Other (comment)(sitting EOB )  OT Visit Diagnosis: History of falling (Z91.81);Other abnormalities of gait and mobility (R26.89)                Time: 1341-1405 OT Time Calculation (min): 24 min Charges:  OT General Charges $OT Visit: 1 Visit OT Evaluation $OT Eval Moderate Complexity: 1 Mod G-Codes:     Larry [name],  OT Pager [phone] 02/18/2018   Larry [name] 02/18/2018, 2:25 PM

## 2018-02-18 NOTE — Plan of Care (Signed)
  Problem: Education: Goal: Knowledge of General Education information will improve Outcome: Progressing Note:  POC reviewed with pt.   

## 2018-02-19 LAB — BASIC METABOLIC PANEL
Anion gap: 10 (ref 5–15)
BUN: 11 mg/dL (ref 6–20)
CO2: 27 mmol/L (ref 22–32)
Calcium: 8.3 mg/dL — ABNORMAL LOW (ref 8.9–10.3)
Chloride: 96 mmol/L — ABNORMAL LOW (ref 101–111)
Creatinine, Ser: 1.05 mg/dL (ref 0.61–1.24)
GFR calc Af Amer: >60 mL/min (ref >60)
GFR calc non Af Amer: >60 mL/min (ref >60)
Glucose, Bld: 112 mg/dL — ABNORMAL HIGH (ref 65–99)
Potassium: 3.3 mmol/L — ABNORMAL LOW (ref 3.5–5.1)
Sodium: 133 mmol/L — ABNORMAL LOW (ref 135–145)

## 2018-02-19 LAB — CBC
HCT: 37.6 % — ABNORMAL LOW (ref 39.0–52.0)
Hemoglobin: 12.5 g/dL — ABNORMAL LOW (ref 13.0–17.0)
MCH: 31.8 pg (ref 26.0–34.0)
MCHC: 33.2 g/dL (ref 30.0–36.0)
MCV: 95.7 fL (ref 78.0–100.0)
Platelets: 93 10*3/uL — ABNORMAL LOW (ref 150–400)
RBC: 3.93 MIL/uL — ABNORMAL LOW (ref 4.22–5.81)
RDW: 13.5 % (ref 11.5–15.5)
WBC: 7.1 10*3/uL (ref 4.0–10.5)

## 2018-02-19 LAB — AEROBIC CULTURE W GRAM STAIN (SUPERFICIAL SPECIMEN)

## 2018-02-19 LAB — URINE CULTURE: Culture: >=100000 — AB

## 2018-02-19 LAB — AEROBIC CULTURE  (SUPERFICIAL SPECIMEN)

## 2018-02-19 MED ORDER — POTASSIUM CHLORIDE CRYS ER 20 MEQ PO TBCR
40.0000 meq | EXTENDED_RELEASE_TABLET | Freq: Two times a day (BID) | ORAL | Status: AC
  Administered 2018-02-19 (×2): 40 meq via ORAL
  Filled 2018-02-19 (×2): qty 2

## 2018-02-19 MED ORDER — SODIUM CHLORIDE 0.9 % IV SOLN
1.0000 g | INTRAVENOUS | Status: DC
  Administered 2018-02-19 – 2018-02-22 (×4): 1 g via INTRAVENOUS
  Filled 2018-02-19 (×4): qty 10

## 2018-02-19 MED ORDER — CEFDINIR 300 MG PO CAPS
300.0000 mg | ORAL_CAPSULE | Freq: Two times a day (BID) | ORAL | Status: DC
  Filled 2018-02-19: qty 1

## 2018-02-19 MED ORDER — SODIUM CHLORIDE 0.9 % IV SOLN
1500.0000 mg | Freq: Once | INTRAVENOUS | Status: AC
  Administered 2018-02-19: 1500 mg via INTRAVENOUS
  Filled 2018-02-19: qty 1500

## 2018-02-19 MED ORDER — DOXYCYCLINE HYCLATE 100 MG PO TABS
100.0000 mg | ORAL_TABLET | Freq: Two times a day (BID) | ORAL | Status: DC
  Filled 2018-02-19: qty 1

## 2018-02-19 MED ORDER — VANCOMYCIN HCL IN DEXTROSE 1-5 GM/200ML-% IV SOLN
1000.0000 mg | Freq: Two times a day (BID) | INTRAVENOUS | Status: DC
  Administered 2018-02-20 – 2018-02-22 (×6): 1000 mg via INTRAVENOUS
  Filled 2018-02-19 (×7): qty 200

## 2018-02-19 NOTE — Progress Notes (Signed)
Pharmacy Antibiotic Note  Larry [name] is a 67 y.o. male admitted on 02/16/2018 with cellulitis.  Pharmacy has been consulted for vancomycin dosing. WBC wnl, Scr variable, febrile. Patient was previously on vancomycin, and it was stopped yesterday, but restarted for worsening fevers.   Plan: -Vancomycin 1500 mg IV load, then vancomycin 1 gm IV Q 12 hours -Monitor CBC, renal fx, cultures and clinical progress -VT at SS   Height: 5\' 6"  (167.6 cm) Weight: 198 lb (89.8 kg) IBW/kg (Calculated) : 63.8  Temp (24hrs), Avg:100.1 F (37.8 C), Min:99.1 F (37.3 C), Max:102.7 F (39.3 C)  Recent Labs  Lab 02/16/18 2012 02/16/18 2034 02/17/18 0549 02/18/18 0632 02/19/18 0414  WBC 12.0*  --  13.7* 12.2* 7.1  CREATININE 1.01  --  1.06 0.94 1.05  LATICACIDVEN  --  1.41  --   --   --     Estimated Creatinine Clearance: 73.6 mL/min (by C-G formula based on SCr of 1.05 mg/dL).    No Known Allergies  Antimicrobials this admission: Vanc 5/22 >> 5/24; 5/25>>  Dose adjustments this admission:  Microbiology results: 5/22 BCx: NGTD 5/22 Wound abscess: MRSA species  Nida Boatman, PharmD PGY1 Acute Care Pharmacy Resident 02/19/2018 11:55 AM

## 2018-02-19 NOTE — Progress Notes (Signed)
Patient received Ibuprofen 600 mg for temp (rectal) 102.0. When rechecked, temp (rectal) was 101.5. MD informed. Will continue to monitor.

## 2018-02-19 NOTE — Progress Notes (Signed)
PROGRESS NOTE    Larry [name]  RCV:[id number] DOB: 05-07-1951 DOA: 02/16/2018 PCP: Merrilee Seashore, MD    Brief Narrative:  67 y.o. male with medical history significant for hypertension, hypothyroidism, osteoarthritis with chronic pain, and degenerative disc disease status post lumbar fusion last month, now presenting to the emergency department with fevers, chills, and abscess involving the lower left leg.  Patient reports that he was recovering well from his recent low back surgery when he accidentally kicked his bed frame with left shin ~4 days ago; he suffered an abrasion. He has continued to bear weight on the left leg without significant pain but noted increasing swelling and surrounding erythema, and then an abscess yesterday. He reports fevers for the past 2 days.   ED Course: Upon arrival to the ED, patient is found to be febrile to 39.1 C, slightly tachycardic, and with vitals otherwise normal.  Chest x-ray is negative for acute cardiopulmonary disease and radiographs of the left lower leg are negative for underlying osseous abnormality.  Chemistry panel is unremarkable and CBC features a leukocytosis to 12,000.  Lactic acid is reassuringly normal.  I&D was performed in the emergency department, blood and wound cultures were sent, 1 L of normal saline administered, Tdap updated, and patient was started on empiric vancomycin and Zosyn.  He remains hemodynamically stable and will be admitted for ongoing evaluation and management of sepsis secondary to skin/soft tissue infection.  Assessment & Plan:   Principal Problem:   Sepsis due to skin infection [facility]) Active Problems:   Hypothyroidism   Essential hypertension   Chronic pain  1. Sepsis secondary to skin/soft tissue infection  - Presented with abscess involving lower left leg and fevers  - Found to be febrile with leukocytosis and lower left leg abscess with surrounding cellulitis  - Plain films negative for underlying  osseous abnormality   - I&D was performed in ED, wound and blood cultures sent -Patient had been continued on vanc -She will transition to Bactrim, given recurrent fevers as well as worsening thrombocytopenia, will resume IV vancomycin  2. Hypertension  - BP currently stable - Continue to monitor for now  3. Hypothyroidism  - Will continue with Synthroid as tolerated   4. Chronic low back pain  - Status-post lumbar fusion last month; reports progressing as expected without complication - Continue home regimen with prn Norco, Dilaudid, and Valium as tolerated  5. E.coli UTI - Urine cx pos for ecoli. Urinalysis suggestive of UTI -Transition to Bactrim which would cover also for staph cellulitis per above. -She noted to have worsening thrombocytopenia thus Bactrim discontinued started on Rocephin -Patient now febrile with rigors this morning  6. Recent lumbar surgery - Followed by Dr. Ellene Route - Wound evaluated, appears to be healing - Have placed new dressing  7.  Thrombocytopenia -Platelets less than 100 today.  Suspect related to acute infection -Stop Bactrim -Repeat CBC in morning  DVT prophylaxis: Lovenox subQ Code Status: Full Family Communication: Pt in room, family not at bedside Disposition Plan: DC home when no longer febrile and infection improved  Consultants:     Procedures:   I/D of wound in ED  Antimicrobials: Anti-infectives (From admission, onward)   Start     Dose/Rate Route Frequency Ordered Stop   02/20/18 0000  vancomycin (VANCOCIN) IVPB 1000 mg/200 mL premix     1,000 mg 200 mL/hr over 60 Minutes Intravenous Every 12 hours 02/19/18 1154     02/19/18 1200  cefTRIAXone (ROCEPHIN) 1 g  in sodium chloride 0.9 % 100 mL IVPB     1 g 200 mL/hr over 30 Minutes Intravenous Every 24 hours 02/19/18 1139     02/19/18 1200  vancomycin (VANCOCIN) 1,500 mg in sodium chloride 0.9 % 500 mL IVPB     1,500 mg 250 mL/hr over 120 Minutes Intravenous  Once  02/19/18 1154 02/19/18 1436   02/19/18 1100  cefdinir (OMNICEF) capsule 300 mg  Status:  Discontinued     300 mg Oral Every 12 hours 02/19/18 0953 02/19/18 1139   02/19/18 1100  doxycycline (VIBRA-TABS) tablet 100 mg  Status:  Discontinued     100 mg Oral Every 12 hours 02/19/18 0953 02/19/18 1139   02/18/18 1415  sulfamethoxazole-trimethoprim (BACTRIM DS,SEPTRA DS) 800-160 MG per tablet 1 tablet  Status:  Discontinued     1 tablet Oral Every 12 hours 02/18/18 1400 02/19/18 0953   02/17/18 1100  vancomycin (VANCOCIN) IVPB 1000 mg/200 mL premix  Status:  Discontinued     1,000 mg 200 mL/hr over 60 Minutes Intravenous Every 12 hours 02/16/18 2206 02/18/18 1400   02/16/18 2215  vancomycin (VANCOCIN) 1,500 mg in sodium chloride 0.9 % 500 mL IVPB     1,500 mg 250 mL/hr over 120 Minutes Intravenous  Once 02/16/18 2205 02/17/18 0105   02/16/18 2015  vancomycin (VANCOCIN) IVPB 1000 mg/200 mL premix  Status:  Discontinued     1,000 mg 200 mL/hr over 60 Minutes Intravenous  Once 02/16/18 2007 02/16/18 2205   02/16/18 2015  piperacillin-tazobactam (ZOSYN) IVPB 3.375 g     3.375 g 100 mL/hr over 30 Minutes Intravenous  Once 02/16/18 2007 02/16/18 2256      Subjective: Complaining of chills this morning  Objective: Vitals:   02/19/18 1135 02/19/18 1238 02/19/18 1451 02/19/18 1612  BP:   (!) 118/57   Pulse:   78   Resp:   20   Temp: (!) 102.7 F (39.3 C) (!) 100.8 F (38.2 C) (!) 102 F (38.9 C) (!) 101.5 F (38.6 C)  TempSrc: Rectal Rectal Rectal Rectal  SpO2:   94%   Weight:      Height:        Intake/Output Summary (Last 24 hours) at 02/19/2018 1829 Last data filed at 02/19/2018 1636 Gross per 24 hour  Intake 1280 ml  Output 400 ml  Net 880 ml   Filed Weights   02/17/18 0900  Weight: 89.8 kg (198 lb)    Examination: General exam: Conversant, in no acute distress Respiratory system: normal chest rise, clear, no audible wheezing Cardiovascular system: regular rhythm,  s1-s2 Gastrointestinal system: Nondistended, nontender, pos BS Central nervous system: No seizures, no tremors Extremities: No cyanosis, no joint deformities Skin: No rashes, no pallor Psychiatry: Affect normal // no auditory hallucinations    Data Reviewed: I have personally reviewed following labs and imaging studies  CBC: Recent Labs  Lab 02/16/18 2012 02/17/18 0549 02/18/18 0632 02/19/18 0414  WBC 12.0* 13.7* 12.2* 7.1  NEUTROABS 10.8* 12.3*  --   --   HGB 14.1 14.9 13.3 12.5*  HCT 42.9 46.4 40.2 37.6*  MCV 96.4 98.9 96.2 95.7  PLT 171 162 120* 93*   Basic Metabolic Panel: Recent Labs  Lab 02/16/18 2012 02/17/18 0549 02/18/18 0632 02/19/18 0414  NA 137 140 138 133*  K 3.8 4.2 3.8 3.3*  CL 98* 100* 101 96*  CO2 28 31 27 27   GLUCOSE 111* 106* 121* 112*  BUN 13 10 12 11   CREATININE  1.01 1.06 0.94 1.05  CALCIUM 8.9 8.8* 8.3* 8.3*   GFR: Estimated Creatinine Clearance: 73.6 mL/min (by C-G formula based on SCr of 1.05 mg/dL). Liver Function Tests: Recent Labs  Lab 02/16/18 2012  AST 29  ALT 23  ALKPHOS 75  BILITOT 1.5*  PROT 6.7  ALBUMIN 4.1   No results for input(s): LIPASE, AMYLASE in the last 168 hours. No results for input(s): AMMONIA in the last 168 hours. Coagulation Profile: No results for input(s): INR, PROTIME in the last 168 hours. Cardiac Enzymes: No results for input(s): CKTOTAL, CKMB, CKMBINDEX, TROPONINI in the last 168 hours. BNP (last 3 results) No results for input(s): PROBNP in the last 8760 hours. HbA1C: No results for input(s): HGBA1C in the last 72 hours. CBG: No results for input(s): GLUCAP in the last 168 hours. Lipid Profile: No results for input(s): CHOL, HDL, LDLCALC, TRIG, CHOLHDL, LDLDIRECT in the last 72 hours. Thyroid Function Tests: No results for input(s): TSH, T4TOTAL, FREET4, T3FREE, THYROIDAB in the last 72 hours. Anemia Panel: No results for input(s): VITAMINB12, FOLATE, FERRITIN, TIBC, IRON, RETICCTPCT in the  last 72 hours. Sepsis Labs: Recent Labs  Lab 02/16/18 2034  LATICACIDVEN 1.41    Recent Results (from the past 240 hour(s))  Wound or Superficial Culture     Status: None   Collection Time: 02/16/18  8:15 PM  Result Value Ref Range Status   Specimen Description WOUND LEFT LOWER LEG  Final   Special Requests NONE  Final   Gram Stain   Final    RARE WBC PRESENT,BOTH PMN AND MONONUCLEAR MODERATE GRAM POSITIVE COCCI Performed at [facility], [address], [place], [place] [address]    Culture FEW METHICILLIN RESISTANT STAPHYLOCOCCUS AUREUS  Final   Report Status 02/19/2018 FINAL  Final   Organism ID, Bacteria METHICILLIN RESISTANT STAPHYLOCOCCUS AUREUS  Final      Susceptibility   Methicillin resistant staphylococcus aureus - MIC*    CIPROFLOXACIN >=8 RESISTANT Resistant     ERYTHROMYCIN >=8 RESISTANT Resistant     GENTAMICIN <=0.5 SENSITIVE Sensitive     OXACILLIN >=4 RESISTANT Resistant     TETRACYCLINE <=1 SENSITIVE Sensitive     VANCOMYCIN 1 SENSITIVE Sensitive     TRIMETH/SULFA <=10 SENSITIVE Sensitive     CLINDAMYCIN <=0.25 SENSITIVE Sensitive     RIFAMPIN <=0.5 SENSITIVE Sensitive     Inducible Clindamycin NEGATIVE Sensitive     * FEW METHICILLIN RESISTANT STAPHYLOCOCCUS AUREUS  Blood culture (routine x 2)     Status: None (Preliminary result)   Collection Time: 02/16/18  8:25 PM  Result Value Ref Range Status   Specimen Description BLOOD RIGHT ANTECUBITAL  Final   Special Requests   Final    BOTTLES DRAWN AEROBIC AND ANAEROBIC Blood Culture adequate volume   Culture   Final    NO GROWTH 3 DAYS Performed at [facility], [address], [place], [place] [address]    Report Status PENDING  Incomplete  Blood culture (routine x 2)     Status: None (Preliminary result)   Collection Time: 02/16/18 10:27 PM  Result Value Ref Range Status   Specimen Description BLOOD LEFT ANTECUBITAL  Final   Special Requests   Final    BOTTLES DRAWN AEROBIC AND ANAEROBIC  Blood Culture adequate volume   Culture   Final    NO GROWTH 3 DAYS Performed at [facility], [place] [address], [place], [place] [address]    Report Status PENDING  Incomplete  Urine culture     Status: Abnormal   Collection Time: 02/16/18 11:22 PM  Result Value Ref Range Status   Specimen Description URINE, CLEAN CATCH  Final   Special Requests   Final    NONE Performed at [facility], [address], [place], [place] [address]    Culture >=100,000 COLONIES/mL ESCHERICHIA COLI (A)  Final   Report Status 02/19/2018 FINAL  Final   Organism ID, Bacteria ESCHERICHIA COLI (A)  Final      Susceptibility   Escherichia coli - MIC*    AMPICILLIN >=32 RESISTANT Resistant     CEFAZOLIN >=64 RESISTANT Resistant     CEFTRIAXONE <=1 SENSITIVE Sensitive     CIPROFLOXACIN <=0.25 SENSITIVE Sensitive     GENTAMICIN <=1 SENSITIVE Sensitive     IMIPENEM <=0.25 SENSITIVE Sensitive     NITROFURANTOIN <=16 SENSITIVE Sensitive     TRIMETH/SULFA <=20 SENSITIVE Sensitive     AMPICILLIN/SULBACTAM >=32 RESISTANT Resistant     PIP/TAZO <=4 SENSITIVE Sensitive     Extended ESBL NEGATIVE Sensitive     * >=100,000 COLONIES/mL ESCHERICHIA COLI     Radiology Studies: No results found.  Scheduled Meds: . aspirin EC  81 mg Oral Daily  . latanoprost  1 drop Both Eyes QHS  . levothyroxine  150 mcg Oral QAC breakfast  . LORazepam  1 mg Oral QHS  . tamsulosin  0.4 mg Oral Daily  . Tdap  0.5 mL Intramuscular Once  . verapamil  180 mg Oral Daily   Continuous Infusions: . cefTRIAXone (ROCEPHIN)  IV Stopped (02/19/18 1234)  . [START ON 02/20/2018] vancomycin       LOS: 3 days   Marylu Lund, MD [facility] Pager [phone]  If 7PM-7AM, please contact night-coverage [email or web address] Password [facility] 02/19/2018, 6:29 PM

## 2018-02-19 NOTE — Progress Notes (Signed)
Patient called staff in his room and verbalized that he is feeling bad. He was shivering; writer check temp (rectal) and was 102.7. MD made aware. Patient received Tylenol 650 mg po per PRN order and temp decrease to 100.8 (rectal). Will continue to monitor.

## 2018-02-20 LAB — COMPREHENSIVE METABOLIC PANEL
ALT: 21 U/L (ref 17–63)
AST: 32 U/L (ref 15–41)
Albumin: 3 g/dL — ABNORMAL LOW (ref 3.5–5.0)
Alkaline Phosphatase: 67 U/L (ref 38–126)
Anion gap: 7 (ref 5–15)
BUN: 12 mg/dL (ref 6–20)
CO2: 27 mmol/L (ref 22–32)
Calcium: 8.2 mg/dL — ABNORMAL LOW (ref 8.9–10.3)
Chloride: 97 mmol/L — ABNORMAL LOW (ref 101–111)
Creatinine, Ser: 1.11 mg/dL (ref 0.61–1.24)
GFR calc Af Amer: >60 mL/min (ref >60)
GFR calc non Af Amer: >60 mL/min (ref >60)
Glucose, Bld: 110 mg/dL — ABNORMAL HIGH (ref 65–99)
Potassium: 4 mmol/L (ref 3.5–5.1)
Sodium: 131 mmol/L — ABNORMAL LOW (ref 135–145)
Total Bilirubin: 0.9 mg/dL (ref 0.3–1.2)
Total Protein: 6 g/dL — ABNORMAL LOW (ref 6.5–8.1)

## 2018-02-20 LAB — CBC
HCT: 39.1 % (ref 39.0–52.0)
Hemoglobin: 12.7 g/dL — ABNORMAL LOW (ref 13.0–17.0)
MCH: 31.2 pg (ref 26.0–34.0)
MCHC: 32.5 g/dL (ref 30.0–36.0)
MCV: 96.1 fL (ref 78.0–100.0)
Platelets: 88 10*3/uL — ABNORMAL LOW (ref 150–400)
RBC: 4.07 MIL/uL — ABNORMAL LOW (ref 4.22–5.81)
RDW: 13.7 % (ref 11.5–15.5)
WBC: 2.7 10*3/uL — ABNORMAL LOW (ref 4.0–10.5)

## 2018-02-20 MED ORDER — LACTATED RINGERS IV SOLN
INTRAVENOUS | Status: DC
  Administered 2018-02-20 – 2018-02-21 (×2): via INTRAVENOUS

## 2018-02-20 NOTE — Progress Notes (Signed)
PROGRESS NOTE    EBB CARELOCK  VWU:[id number] DOB: 1951-07-12 DOA: 02/16/2018 PCP: Merrilee Seashore, MD    Brief Narrative:  67 y.o. male with medical history significant for hypertension, hypothyroidism, osteoarthritis with chronic pain, and degenerative disc disease status post lumbar fusion last month, now presenting to the emergency department with fevers, chills, and abscess involving the lower left leg.  Patient reports that he was recovering well from his recent low back surgery when he accidentally kicked his bed frame with left shin ~4 days ago; he suffered an abrasion. He has continued to bear weight on the left leg without significant pain but noted increasing swelling and surrounding erythema, and then an abscess yesterday. He reports fevers for the past 2 days.   ED Course: Upon arrival to the ED, patient is found to be febrile to 39.1 C, slightly tachycardic, and with vitals otherwise normal.  Chest x-ray is negative for acute cardiopulmonary disease and radiographs of the left lower leg are negative for underlying osseous abnormality.  Chemistry panel is unremarkable and CBC features a leukocytosis to 12,000.  Lactic acid is reassuringly normal.  I&D was performed in the emergency department, blood and wound cultures were sent, 1 L of normal saline administered, Tdap updated, and patient was started on empiric vancomycin and Zosyn.  He remains hemodynamically stable and will be admitted for ongoing evaluation and management of sepsis secondary to skin/soft tissue infection.  Assessment & Plan:   Principal Problem:   Sepsis due to skin infection [facility]) Active Problems:   Hypothyroidism   Essential hypertension   Chronic pain  1. Sepsis secondary to skin/soft tissue infection  - Presented with abscess involving lower left leg and fevers  - Found to be febrile with leukocytosis and lower left leg abscess with surrounding cellulitis  - Plain films negative for underlying  osseous abnormality   - I&D was performed in ED, wound and blood cultures sent -Patient had been continued on vanc -Continued on vancomycin -remains febrile  2. Hypertension  - BP currently stable - Continue to monitor for now -stable  3. Hypothyroidism  - Will continue with Synthroid as tolerated  -stable  4. Chronic low back pain  - Status-post lumbar fusion last month; reports progressing as expected without complication - Continue home regimen with prn Norco, Dilaudid, and Valium as tolerated -stable  5. E.coli UTI - Urine cx pos for ecoli. Urinalysis suggestive of UTI -Transition to Bactrim which would cover also for staph cellulitis per above. -now on rocephin per sensitivities -remains febrile  6. Recent lumbar surgery - Followed by Dr. Ellene Route - Wound evaluated, appears to be healing. New dressings placed on 5/25 -stable  7.  Pancytopenia -worsening -suspect secondary to acute infection -Will repeat CBC in AM -Treat active infection per above  DVT prophylaxis: scd's Code Status: Full Family Communication: Pt in room, family not at bedside Disposition Plan: DC home when no longer febrile and infection improved  Consultants:     Procedures:   I/D of wound in ED  Antimicrobials: Anti-infectives (From admission, onward)   Start     Dose/Rate Route Frequency Ordered Stop   02/20/18 0000  vancomycin (VANCOCIN) IVPB 1000 mg/200 mL premix     1,000 mg 200 mL/hr over 60 Minutes Intravenous Every 12 hours 02/19/18 1154     02/19/18 1200  cefTRIAXone (ROCEPHIN) 1 g in sodium chloride 0.9 % 100 mL IVPB     1 g 200 mL/hr over 30 Minutes Intravenous Every 24  hours 02/19/18 1139     02/19/18 1200  vancomycin (VANCOCIN) 1,500 mg in sodium chloride 0.9 % 500 mL IVPB     1,500 mg 250 mL/hr over 120 Minutes Intravenous  Once 02/19/18 1154 02/19/18 1436   02/19/18 1100  cefdinir (OMNICEF) capsule 300 mg  Status:  Discontinued     300 mg Oral Every 12 hours  02/19/18 0953 02/19/18 1139   02/19/18 1100  doxycycline (VIBRA-TABS) tablet 100 mg  Status:  Discontinued     100 mg Oral Every 12 hours 02/19/18 0953 02/19/18 1139   02/18/18 1415  sulfamethoxazole-trimethoprim (BACTRIM DS,SEPTRA DS) 800-160 MG per tablet 1 tablet  Status:  Discontinued     1 tablet Oral Every 12 hours 02/18/18 1400 02/19/18 0953   02/17/18 1100  vancomycin (VANCOCIN) IVPB 1000 mg/200 mL premix  Status:  Discontinued     1,000 mg 200 mL/hr over 60 Minutes Intravenous Every 12 hours 02/16/18 2206 02/18/18 1400   02/16/18 2215  vancomycin (VANCOCIN) 1,500 mg in sodium chloride 0.9 % 500 mL IVPB     1,500 mg 250 mL/hr over 120 Minutes Intravenous  Once 02/16/18 2205 02/17/18 0105   02/16/18 2015  vancomycin (VANCOCIN) IVPB 1000 mg/200 mL premix  Status:  Discontinued     1,000 mg 200 mL/hr over 60 Minutes Intravenous  Once 02/16/18 2007 02/16/18 2205   02/16/18 2015  piperacillin-tazobactam (ZOSYN) IVPB 3.375 g     3.375 g 100 mL/hr over 30 Minutes Intravenous  Once 02/16/18 2007 02/16/18 2256      Subjective: Without complaints  Objective: Vitals:   02/19/18 1612 02/19/18 2100 02/19/18 2310 02/20/18 0500  BP:  118/69  (!) 158/85  Pulse:  67  82  Resp:  18  18  Temp: (!) 101.5 F (38.6 C) 98.6 F (37 C) 99.6 F (37.6 C) (!) 101.4 F (38.6 C)  TempSrc: Rectal Rectal Rectal Rectal  SpO2:  96%  91%  Weight:      Height:        Intake/Output Summary (Last 24 hours) at 02/20/2018 0955 Last data filed at 02/20/2018 0755 Gross per 24 hour  Intake 1180 ml  Output 450 ml  Net 730 ml   Filed Weights   02/17/18 0900  Weight: 89.8 kg (198 lb)    Examination: General exam: Awake, laying in bed, in nad Respiratory system: Normal respiratory effort, no wheezing Cardiovascular system: regular rate, s1, s2 Gastrointestinal system: Soft, nondistended, positive BS Central nervous system: CN2-12 grossly intact, strength intact Extremities: Perfused, no  clubbing Skin: LE dressings in place, dry Psychiatry: Mood normal // no visual hallucinations   Data Reviewed: I have personally reviewed following labs and imaging studies  CBC: Recent Labs  Lab 02/16/18 2012 02/17/18 0549 02/18/18 0632 02/19/18 0414 02/20/18 0331  WBC 12.0* 13.7* 12.2* 7.1 2.7*  NEUTROABS 10.8* 12.3*  --   --   --   HGB 14.1 14.9 13.3 12.5* 12.7*  HCT 42.9 46.4 40.2 37.6* 39.1  MCV 96.4 98.9 96.2 95.7 96.1  PLT 171 162 120* 93* 88*   Basic Metabolic Panel: Recent Labs  Lab 02/16/18 2012 02/17/18 0549 02/18/18 0632 02/19/18 0414 02/20/18 0331  NA 137 140 138 133* 131*  K 3.8 4.2 3.8 3.3* 4.0  CL 98* 100* 101 96* 97*  CO2 28 31 27 27 27   GLUCOSE 111* 106* 121* 112* 110*  BUN 13 10 12 11 12   CREATININE 1.01 1.06 0.94 1.05 1.11  CALCIUM 8.9 8.8* 8.3*  8.3* 8.2*   GFR: Estimated Creatinine Clearance: 69.6 mL/min (by C-G formula based on SCr of 1.11 mg/dL). Liver Function Tests: Recent Labs  Lab 02/16/18 2012 02/20/18 0331  AST 29 32  ALT 23 21  ALKPHOS 75 67  BILITOT 1.5* 0.9  PROT 6.7 6.0*  ALBUMIN 4.1 3.0*   No results for input(s): LIPASE, AMYLASE in the last 168 hours. No results for input(s): AMMONIA in the last 168 hours. Coagulation Profile: No results for input(s): INR, PROTIME in the last 168 hours. Cardiac Enzymes: No results for input(s): CKTOTAL, CKMB, CKMBINDEX, TROPONINI in the last 168 hours. BNP (last 3 results) No results for input(s): PROBNP in the last 8760 hours. HbA1C: No results for input(s): HGBA1C in the last 72 hours. CBG: No results for input(s): GLUCAP in the last 168 hours. Lipid Profile: No results for input(s): CHOL, HDL, LDLCALC, TRIG, CHOLHDL, LDLDIRECT in the last 72 hours. Thyroid Function Tests: No results for input(s): TSH, T4TOTAL, FREET4, T3FREE, THYROIDAB in the last 72 hours. Anemia Panel: No results for input(s): VITAMINB12, FOLATE, FERRITIN, TIBC, IRON, RETICCTPCT in the last 72 hours. Sepsis  Labs: Recent Labs  Lab 02/16/18 2034  LATICACIDVEN 1.41    Recent Results (from the past 240 hour(s))  Wound or Superficial Culture     Status: None   Collection Time: 02/16/18  8:15 PM  Result Value Ref Range Status   Specimen Description WOUND LEFT LOWER LEG  Final   Special Requests NONE  Final   Gram Stain   Final    RARE WBC PRESENT,BOTH PMN AND MONONUCLEAR MODERATE GRAM POSITIVE COCCI Performed at [facility], [address] [place], [place] [address]    Culture FEW METHICILLIN RESISTANT STAPHYLOCOCCUS AUREUS  Final   Report Status 02/19/2018 FINAL  Final   Organism ID, Bacteria METHICILLIN RESISTANT STAPHYLOCOCCUS AUREUS  Final      Susceptibility   Methicillin resistant staphylococcus aureus - MIC*    CIPROFLOXACIN >=8 RESISTANT Resistant     ERYTHROMYCIN >=8 RESISTANT Resistant     GENTAMICIN <=0.5 SENSITIVE Sensitive     OXACILLIN >=4 RESISTANT Resistant     TETRACYCLINE <=1 SENSITIVE Sensitive     VANCOMYCIN 1 SENSITIVE Sensitive     TRIMETH/SULFA <=10 SENSITIVE Sensitive     CLINDAMYCIN <=0.25 SENSITIVE Sensitive     RIFAMPIN <=0.5 SENSITIVE Sensitive     Inducible Clindamycin NEGATIVE Sensitive     * FEW METHICILLIN RESISTANT STAPHYLOCOCCUS AUREUS  Blood culture (routine x 2)     Status: None (Preliminary result)   Collection Time: 02/16/18  8:25 PM  Result Value Ref Range Status   Specimen Description BLOOD RIGHT ANTECUBITAL  Final   Special Requests   Final    BOTTLES DRAWN AEROBIC AND ANAEROBIC Blood Culture adequate volume   Culture   Final    NO GROWTH 4 DAYS Performed at [facility], [address], [place], [place] [address]    Report Status PENDING  Incomplete  Blood culture (routine x 2)     Status: None (Preliminary result)   Collection Time: 02/16/18 10:27 PM  Result Value Ref Range Status   Specimen Description BLOOD LEFT ANTECUBITAL  Final   Special Requests   Final    BOTTLES DRAWN AEROBIC AND ANAEROBIC Blood Culture adequate  volume   Culture   Final    NO GROWTH 4 DAYS Performed at [facility], [place] [address], [place], [place] [address]    Report Status PENDING  Incomplete  Urine culture     Status: Abnormal   Collection Time: 02/16/18 11:22 PM  Result Value Ref Range Status   Specimen Description URINE, CLEAN CATCH  Final   Special Requests   Final    NONE Performed at [facility], [address], [place], [place] [address]    Culture >=100,000 COLONIES/mL ESCHERICHIA COLI (A)  Final   Report Status 02/19/2018 FINAL  Final   Organism ID, Bacteria ESCHERICHIA COLI (A)  Final      Susceptibility   Escherichia coli - MIC*    AMPICILLIN >=32 RESISTANT Resistant     CEFAZOLIN >=64 RESISTANT Resistant     CEFTRIAXONE <=1 SENSITIVE Sensitive     CIPROFLOXACIN <=0.25 SENSITIVE Sensitive     GENTAMICIN <=1 SENSITIVE Sensitive     IMIPENEM <=0.25 SENSITIVE Sensitive     NITROFURANTOIN <=16 SENSITIVE Sensitive     TRIMETH/SULFA <=20 SENSITIVE Sensitive     AMPICILLIN/SULBACTAM >=32 RESISTANT Resistant     PIP/TAZO <=4 SENSITIVE Sensitive     Extended ESBL NEGATIVE Sensitive     * >=100,000 COLONIES/mL ESCHERICHIA COLI     Radiology Studies: No results found.  Scheduled Meds: . aspirin EC  81 mg Oral Daily  . latanoprost  1 drop Both Eyes QHS  . levothyroxine  150 mcg Oral QAC breakfast  . LORazepam  1 mg Oral QHS  . tamsulosin  0.4 mg Oral Daily  . Tdap  0.5 mL Intramuscular Once  . verapamil  180 mg Oral Daily   Continuous Infusions: . cefTRIAXone (ROCEPHIN)  IV Stopped (02/19/18 1234)  . vancomycin Stopped (02/20/18 0115)     LOS: 4 days   Marylu Lund, MD [facility] Pager [phone]  If 7PM-7AM, please contact night-coverage [email or web address] Password [facility] 02/20/2018, 9:55 AM

## 2018-02-20 NOTE — Progress Notes (Signed)
Patient does not have VTE orders,  attending text paged.

## 2018-02-21 LAB — CBC
HCT: 39.1 % (ref 39.0–52.0)
Hemoglobin: 12.7 g/dL — ABNORMAL LOW (ref 13.0–17.0)
MCH: 30.9 pg (ref 26.0–34.0)
MCHC: 32.5 g/dL (ref 30.0–36.0)
MCV: 95.1 fL (ref 78.0–100.0)
Platelets: 89 10*3/uL — ABNORMAL LOW (ref 150–400)
RBC: 4.11 MIL/uL — ABNORMAL LOW (ref 4.22–5.81)
RDW: 13.9 % (ref 11.5–15.5)
WBC: 2.5 10*3/uL — ABNORMAL LOW (ref 4.0–10.5)

## 2018-02-21 LAB — BASIC METABOLIC PANEL
Anion gap: 7 (ref 5–15)
BUN: 11 mg/dL (ref 6–20)
CO2: 27 mmol/L (ref 22–32)
Calcium: 8.5 mg/dL — ABNORMAL LOW (ref 8.9–10.3)
Chloride: 102 mmol/L (ref 101–111)
Creatinine, Ser: 0.88 mg/dL (ref 0.61–1.24)
GFR calc Af Amer: >60 mL/min (ref >60)
GFR calc non Af Amer: >60 mL/min (ref >60)
Glucose, Bld: 110 mg/dL — ABNORMAL HIGH (ref 65–99)
Potassium: 3.8 mmol/L (ref 3.5–5.1)
Sodium: 136 mmol/L (ref 135–145)

## 2018-02-21 LAB — CULTURE, BLOOD (ROUTINE X 2)
Culture: NO GROWTH
Culture: NO GROWTH
Special Requests: ADEQUATE
Special Requests: ADEQUATE

## 2018-02-21 MED ORDER — ZOLPIDEM TARTRATE 5 MG PO TABS
5.0000 mg | ORAL_TABLET | Freq: Every evening | ORAL | Status: DC | PRN
  Administered 2018-02-22 (×2): 5 mg via ORAL
  Filled 2018-02-21 (×2): qty 1

## 2018-02-21 MED ORDER — KETOROLAC TROMETHAMINE 30 MG/ML IJ SOLN: 30.0000 mg | Freq: Four times a day (QID) | INTRAMUSCULAR | Status: DC | PRN

## 2018-02-21 NOTE — Progress Notes (Signed)
PROGRESS NOTE    Larry [name]  SEG:[id number] DOB: Nov 04, 1950 DOA: 02/16/2018 PCP: Merrilee Seashore, MD    Brief Narrative:  67 y.o. male with medical history significant for hypertension, hypothyroidism, osteoarthritis with chronic pain, and degenerative disc disease status post lumbar fusion last month, now presenting to the emergency department with fevers, chills, and abscess involving the lower left leg.  Patient reports that he was recovering well from his recent low back surgery when he accidentally kicked his bed frame with left shin ~4 days ago; he suffered an abrasion. He has continued to bear weight on the left leg without significant pain but noted increasing swelling and surrounding erythema, and then an abscess yesterday. He reports fevers for the past 2 days.   ED Course: Upon arrival to the ED, patient is found to be febrile to 39.1 C, slightly tachycardic, and with vitals otherwise normal.  Chest x-ray is negative for acute cardiopulmonary disease and radiographs of the left lower leg are negative for underlying osseous abnormality.  Chemistry panel is unremarkable and CBC features a leukocytosis to 12,000.  Lactic acid is reassuringly normal.  I&D was performed in the emergency department, blood and wound cultures were sent, 1 L of normal saline administered, Tdap updated, and patient was started on empiric vancomycin and Zosyn.  He remains hemodynamically stable and will be admitted for ongoing evaluation and management of sepsis secondary to skin/soft tissue infection.  Assessment & Plan:   Principal Problem:   Sepsis due to skin infection [facility]) Active Problems:   Hypothyroidism   Essential hypertension   Chronic pain  1. Sepsis secondary to skin/soft tissue infection  - Presented with abscess involving lower left leg and fevers  - Found to be febrile with leukocytosis and lower left leg abscess with surrounding cellulitis  - Plain films negative for underlying  osseous abnormality   - I&D was performed in ED, wound and blood cultures sent -Patient had been continued on vanc -Continued on vancomycin -fevers improving  2. Hypertension  - BP currently stable - Continue to monitor for now - thus far stable  3. Hypothyroidism  - Will continue with Synthroid as tolerated  -stable at this time  4. Chronic low back pain  - Status-post lumbar fusion last month; reports progressing as expected without complication - Continue home regimen with prn Norco, Dilaudid, and Valium as tolerated - presently stable  5. E.coli UTI - Urine cx pos for ecoli. Urinalysis suggestive of UTI -Transition to Bactrim which would cover also for staph cellulitis per above. -now on rocephin per sensitivities -fevers are improving  6. Recent lumbar surgery - Followed by Dr. Ellene Route - Wound evaluated, appears to be healing. New dressings placed on 5/25 - remains stable at present.  7.  Pancytopenia -worsening -suspect secondary to acute infection -Will repeat CBC in AM -Treat active infection per above  DVT prophylaxis: scd's Code Status: Full Family Communication: Pt in room, family not at bedside Disposition Plan: DC home when no longer febrile and infection improved  Consultants:     Procedures:   I/D of wound in ED  Antimicrobials: Anti-infectives (From admission, onward)   Start     Dose/Rate Route Frequency Ordered Stop   02/20/18 0000  vancomycin (VANCOCIN) IVPB 1000 mg/200 mL premix     1,000 mg 200 mL/hr over 60 Minutes Intravenous Every 12 hours 02/19/18 1154     02/19/18 1200  cefTRIAXone (ROCEPHIN) 1 g in sodium chloride 0.9 % 100 mL IVPB  1 g 200 mL/hr over 30 Minutes Intravenous Every 24 hours 02/19/18 1139     02/19/18 1200  vancomycin (VANCOCIN) 1,500 mg in sodium chloride 0.9 % 500 mL IVPB     1,500 mg 250 mL/hr over 120 Minutes Intravenous  Once 02/19/18 1154 02/19/18 1436   02/19/18 1100  cefdinir (OMNICEF) capsule 300 mg   Status:  Discontinued     300 mg Oral Every 12 hours 02/19/18 0953 02/19/18 1139   02/19/18 1100  doxycycline (VIBRA-TABS) tablet 100 mg  Status:  Discontinued     100 mg Oral Every 12 hours 02/19/18 0953 02/19/18 1139   02/18/18 1415  sulfamethoxazole-trimethoprim (BACTRIM DS,SEPTRA DS) 800-160 MG per tablet 1 tablet  Status:  Discontinued     1 tablet Oral Every 12 hours 02/18/18 1400 02/19/18 0953   02/17/18 1100  vancomycin (VANCOCIN) IVPB 1000 mg/200 mL premix  Status:  Discontinued     1,000 mg 200 mL/hr over 60 Minutes Intravenous Every 12 hours 02/16/18 2206 02/18/18 1400   02/16/18 2215  vancomycin (VANCOCIN) 1,500 mg in sodium chloride 0.9 % 500 mL IVPB     1,500 mg 250 mL/hr over 120 Minutes Intravenous  Once 02/16/18 2205 02/17/18 0105   02/16/18 2015  vancomycin (VANCOCIN) IVPB 1000 mg/200 mL premix  Status:  Discontinued     1,000 mg 200 mL/hr over 60 Minutes Intravenous  Once 02/16/18 2007 02/16/18 2205   02/16/18 2015  piperacillin-tazobactam (ZOSYN) IVPB 3.375 g     3.375 g 100 mL/hr over 30 Minutes Intravenous  Once 02/16/18 2007 02/16/18 2256      Subjective: Reports feeling better. States having pain in base of R foot and calf  Objective: Vitals:   02/20/18 0500 02/20/18 1424 02/20/18 1559 02/21/18 0639  BP: (!) 158/85 100/80  (!) 157/71  Pulse: 82 75  68  Resp: 18 18  17   Temp: (!) 101.4 F (38.6 C) 98.9 F (37.2 C) (!) 101.3 F (38.5 C) 98.1 F (36.7 C)  TempSrc: Rectal  Rectal Rectal  SpO2: 91% 97%  94%  Weight:      Height:        Intake/Output Summary (Last 24 hours) at 02/21/2018 1448 Last data filed at 02/21/2018 1250 Gross per 24 hour  Intake 2085 ml  Output 400 ml  Net 1685 ml   Filed Weights   02/17/18 0900  Weight: 89.8 kg (198 lb)    Examination: General exam: Conversant, in no acute distress Respiratory system: normal chest rise, clear, no audible wheezing Cardiovascular system: regular rhythm, s1-s2 Gastrointestinal system:  Nondistended, nontender, pos BS Central nervous system: No seizures, no tremors Extremities: No cyanosis, no joint deformities Skin: No rashes, no pallor Psychiatry: Affect normal // no auditory hallucinations   Data Reviewed: I have personally reviewed following labs and imaging studies  CBC: Recent Labs  Lab 02/16/18 2012 02/17/18 0549 02/18/18 9233 02/19/18 0414 02/20/18 0331 02/21/18 0357  WBC 12.0* 13.7* 12.2* 7.1 2.7* 2.5*  NEUTROABS 10.8* 12.3*  --   --   --   --   HGB 14.1 14.9 13.3 12.5* 12.7* 12.7*  HCT 42.9 46.4 40.2 37.6* 39.1 39.1  MCV 96.4 98.9 96.2 95.7 96.1 95.1  PLT 171 162 120* 93* 88* 89*   Basic Metabolic Panel: Recent Labs  Lab 02/17/18 0549 02/18/18 0632 02/19/18 0414 02/20/18 0331 02/21/18 0357  NA 140 138 133* 131* 136  K 4.2 3.8 3.3* 4.0 3.8  CL 100* 101 96* 97* 102  CO2 31 27 27 27 27   GLUCOSE 106* 121* 112* 110* 110*  BUN 10 12 11 12 11   CREATININE 1.06 0.94 1.05 1.11 0.88  CALCIUM 8.8* 8.3* 8.3* 8.2* 8.5*   GFR: Estimated Creatinine Clearance: 87.8 mL/min (by C-G formula based on SCr of 0.88 mg/dL). Liver Function Tests: Recent Labs  Lab 02/16/18 2012 02/20/18 0331  AST 29 32  ALT 23 21  ALKPHOS 75 67  BILITOT 1.5* 0.9  PROT 6.7 6.0*  ALBUMIN 4.1 3.0*   No results for input(s): LIPASE, AMYLASE in the last 168 hours. No results for input(s): AMMONIA in the last 168 hours. Coagulation Profile: No results for input(s): INR, PROTIME in the last 168 hours. Cardiac Enzymes: No results for input(s): CKTOTAL, CKMB, CKMBINDEX, TROPONINI in the last 168 hours. BNP (last 3 results) No results for input(s): PROBNP in the last 8760 hours. HbA1C: No results for input(s): HGBA1C in the last 72 hours. CBG: No results for input(s): GLUCAP in the last 168 hours. Lipid Profile: No results for input(s): CHOL, HDL, LDLCALC, TRIG, CHOLHDL, LDLDIRECT in the last 72 hours. Thyroid Function Tests: No results for input(s): TSH, T4TOTAL, FREET4,  T3FREE, THYROIDAB in the last 72 hours. Anemia Panel: No results for input(s): VITAMINB12, FOLATE, FERRITIN, TIBC, IRON, RETICCTPCT in the last 72 hours. Sepsis Labs: Recent Labs  Lab 02/16/18 2034  LATICACIDVEN 1.41    Recent Results (from the past 240 hour(s))  Wound or Superficial Culture     Status: None   Collection Time: 02/16/18  8:15 PM  Result Value Ref Range Status   Specimen Description WOUND LEFT LOWER LEG  Final   Special Requests NONE  Final   Gram Stain   Final    RARE WBC PRESENT,BOTH PMN AND MONONUCLEAR MODERATE GRAM POSITIVE COCCI Performed at [facility], [address], [place], [place] [address]    Culture FEW METHICILLIN RESISTANT STAPHYLOCOCCUS AUREUS  Final   Report Status 02/19/2018 FINAL  Final   Organism ID, Bacteria METHICILLIN RESISTANT STAPHYLOCOCCUS AUREUS  Final      Susceptibility   Methicillin resistant staphylococcus aureus - MIC*    CIPROFLOXACIN >=8 RESISTANT Resistant     ERYTHROMYCIN >=8 RESISTANT Resistant     GENTAMICIN <=0.5 SENSITIVE Sensitive     OXACILLIN >=4 RESISTANT Resistant     TETRACYCLINE <=1 SENSITIVE Sensitive     VANCOMYCIN 1 SENSITIVE Sensitive     TRIMETH/SULFA <=10 SENSITIVE Sensitive     CLINDAMYCIN <=0.25 SENSITIVE Sensitive     RIFAMPIN <=0.5 SENSITIVE Sensitive     Inducible Clindamycin NEGATIVE Sensitive     * FEW METHICILLIN RESISTANT STAPHYLOCOCCUS AUREUS  Blood culture (routine x 2)     Status: None   Collection Time: 02/16/18  8:25 PM  Result Value Ref Range Status   Specimen Description BLOOD RIGHT ANTECUBITAL  Final   Special Requests   Final    BOTTLES DRAWN AEROBIC AND ANAEROBIC Blood Culture adequate volume   Culture   Final    NO GROWTH 5 DAYS Performed at [facility], [address], [place], [place] [address]    Report Status 02/21/2018 FINAL  Final  Blood culture (routine x 2)     Status: None   Collection Time: 02/16/18 10:27 PM  Result Value Ref Range Status   Specimen  Description BLOOD LEFT ANTECUBITAL  Final   Special Requests   Final    BOTTLES DRAWN AEROBIC AND ANAEROBIC Blood Culture adequate volume   Culture  Final    NO GROWTH 5 DAYS Performed at [facility], [address], [place], [place] [address]    Report Status 02/21/2018 FINAL  Final  Urine culture     Status: Abnormal   Collection Time: 02/16/18 11:22 PM  Result Value Ref Range Status   Specimen Description URINE, CLEAN CATCH  Final   Special Requests   Final    NONE Performed at [facility], [place] [address], [place], [place] [address]    Culture >=100,000 COLONIES/mL ESCHERICHIA COLI (A)  Final   Report Status 02/19/2018 FINAL  Final   Organism ID, Bacteria ESCHERICHIA COLI (A)  Final      Susceptibility   Escherichia coli - MIC*    AMPICILLIN >=32 RESISTANT Resistant     CEFAZOLIN >=64 RESISTANT Resistant     CEFTRIAXONE <=1 SENSITIVE Sensitive     CIPROFLOXACIN <=0.25 SENSITIVE Sensitive     GENTAMICIN <=1 SENSITIVE Sensitive     IMIPENEM <=0.25 SENSITIVE Sensitive     NITROFURANTOIN <=16 SENSITIVE Sensitive     TRIMETH/SULFA <=20 SENSITIVE Sensitive     AMPICILLIN/SULBACTAM >=32 RESISTANT Resistant     PIP/TAZO <=4 SENSITIVE Sensitive     Extended ESBL NEGATIVE Sensitive     * >=100,000 COLONIES/mL ESCHERICHIA COLI     Radiology Studies: No results found.  Scheduled Meds: . aspirin EC  81 mg Oral Daily  . latanoprost  1 drop Both Eyes QHS  . levothyroxine  150 mcg Oral QAC breakfast  . LORazepam  1 mg Oral QHS  . tamsulosin  0.4 mg Oral Daily  . Tdap  0.5 mL Intramuscular Once  . verapamil  180 mg Oral Daily   Continuous Infusions: . cefTRIAXone (ROCEPHIN)  IV Stopped (02/21/18 1421)  . lactated ringers 75 mL/hr at 02/21/18 0945  . vancomycin Stopped (02/21/18 1452)     LOS: 5 days   Marylu Lund, MD [facility] Pager [phone]  If 7PM-7AM, please contact night-coverage [email or web address] Password [facility] 02/21/2018, 2:48 PM

## 2018-02-21 NOTE — Progress Notes (Signed)
Occupational Therapy Treatment Patient Details Name: Larry [name] MRN: [id number] DOB: Apr 10, 1951 Today's Date: 02/21/2018    History of present illness Larry [name] is a 67 y.o. male with medical history significant for hypertension, hypothyroidism, osteoarthritis with chronic pain, and degenerative disc disease status ost lumbar fusion last month, now presenting to the emergency department with fevers, chills, and abscess involving the lower left leg.  Patient reports that he was recovering well from his recent low back surgery when he accidentally kicked his bed frame with left shin ~4 days ago; he suffered an abrasion. He has continued to bear weight on the left leg without significant pain but noted increasing swelling and surrounding erythema, and then an abscess yesterday. He reports fevers for the past 2 days. s/p L3-ilieum hardware revision 3 weeks ago; s/p Rt TKA, s/p 2 Lt kene scopes. Patient reports 3 falls since LS surgery.   OT comments  Pt progressing towards OT goals, presents sitting EOB agreeable to OT tx session. Pt completing room level functional mobility using RW with Minguard assist; completing toileting, standing grooming ADLs, and donning socks using AE with minguard assist throughout. Pt requiring min cues this session for safety and to adhere to back precautions during session completion. Will continue to follow acutely to progress pt towards established OT goals.   Follow Up Recommendations  No OT follow up;Supervision/Assistance - 24 hour(supervision initially )    Equipment Recommendations  3 in 1 bedside commode          Precautions / Restrictions Precautions Precautions: Back Precaution Comments: pt able to verbalize 3/3 back precautions Required Braces or Orthoses: Spinal Brace Spinal Brace: Lumbar corset Restrictions Weight Bearing Restrictions: No       Mobility Bed Mobility               General bed mobility comments: sitting EOB  upon entering room   Transfers Overall transfer level: Needs assistance Equipment used: Rolling walker (2 wheeled) Transfers: Sit to/from Stand Sit to Stand: Min guard         General transfer comment: MinGuard for safety     Balance Overall balance assessment: Needs assistance Sitting-balance support: No upper extremity supported;Feet supported Sitting balance-Leahy Scale: Good       Standing balance-Leahy Scale: Fair Standing balance comment: able to static stand without UE support and minguard for safety                            ADL either performed or assessed with clinical judgement   ADL Overall ADL's : Needs assistance/impaired     Grooming: Min guard;Standing;Oral care;Wash/dry hands Grooming Details (indicate cue type and reason): verbal cues for maintaining back precautions and educated on compensatory techniques for completing oral care              Lower Body Dressing: Min guard;Sit to/from stand;With adaptive equipment Lower Body Dressing Details (indicate cue type and reason): pt using sock aide to don socks seated EOB  Toilet Transfer: Min guard;Ambulation;Regular Toilet;Grab bars;RW Armed forces technical officer Details (indicate cue type and reason): increased effort for sit<>stand, no physical assist needed, minguard for safety  Toileting- Clothing Manipulation and Hygiene: Min guard;Sit to/from stand       Functional mobility during ADLs: Min guard;Rolling walker General ADL Comments: pt requires min cues overall for safety and to adhere to back precautions during ADLs  Cognition Arousal/Alertness: Awake/alert Behavior During Therapy: WFL for tasks assessed/performed Overall Cognitive Status: No family/caregiver present to determine baseline cognitive functioning Area of Impairment: Safety/judgement;Attention;Memory                   Current Attention Level: Selective     Safety/Judgement: Decreased  awareness of safety;Decreased awareness of deficits     General Comments: unsure of pt's baseline cognition; pt was able to tell therapist that he was only able to recall 2/3 back precautions last week with therapist but does not recall meeting this therapist previously; requires min safety cues during session and cues for redirection as pt is chatty                           Pertinent Vitals/ Pain       Pain Assessment: Faces Faces Pain Scale: Hurts little more Pain Location: bil feet  Pain Descriptors / Indicators: Tingling Pain Intervention(s): Monitored during session                                                          Frequency  Min 2X/week        Progress Toward Goals  OT Goals(current goals can now be found in the care plan section)  Progress towards OT goals: Progressing toward goals  Acute Rehab OT Goals Patient Stated Goal: Get back to work OT Goal Formulation: With patient Time For Goal Achievement: 03/04/18 Potential to Achieve Goals: Good  Plan Discharge plan needs to be updated                    AM-PAC PT "6 Clicks" Daily Activity     Outcome Measure   Help from another person eating meals?: None Help from another person taking care of personal grooming?: A Little Help from another person toileting, which includes using toliet, bedpan, or urinal?: A Little Help from another person bathing (including washing, rinsing, drying)?: A Little Help from another person to put on and taking off regular upper body clothing?: A Little Help from another person to put on and taking off regular lower body clothing?: A Little 6 Click Score: 19    End of Session Equipment Utilized During Treatment: Rolling walker;Back brace;Gait belt  OT Visit Diagnosis: History of falling (Z91.81);Other abnormalities of gait and mobility (R26.89)   Activity Tolerance Patient tolerated treatment well   Patient Left in chair;with call  bell/phone within reach;with chair alarm set   Nurse Communication Mobility status        Time: 2774-1287 OT Time Calculation (min): 35 min  Charges: OT General Charges $OT Visit: 1 Visit OT Treatments $Self Care/Home Management : 23-37 mins  Lou Cal, OT Pager [phone] 02/21/2018    Raymondo Band 02/21/2018, 1:29 PM

## 2018-02-22 ENCOUNTER — Inpatient Hospital Stay (HOSPITAL_COMMUNITY): Payer: Medicare Other

## 2018-02-22 DIAGNOSIS — M7989 Other specified soft tissue disorders: Secondary | ICD-10-CM

## 2018-02-22 DIAGNOSIS — R609 Edema, unspecified: Secondary | ICD-10-CM

## 2018-02-22 DIAGNOSIS — M79609 Pain in unspecified limb: Secondary | ICD-10-CM

## 2018-02-22 LAB — COMPREHENSIVE METABOLIC PANEL
ALT: 30 U/L (ref 17–63)
AST: 42 U/L — ABNORMAL HIGH (ref 15–41)
Albumin: 2.8 g/dL — ABNORMAL LOW (ref 3.5–5.0)
Alkaline Phosphatase: 58 U/L (ref 38–126)
Anion gap: 8 (ref 5–15)
BUN: 9 mg/dL (ref 6–20)
CO2: 29 mmol/L (ref 22–32)
Calcium: 8.7 mg/dL — ABNORMAL LOW (ref 8.9–10.3)
Chloride: 103 mmol/L (ref 101–111)
Creatinine, Ser: 0.89 mg/dL (ref 0.61–1.24)
GFR calc Af Amer: >60 mL/min (ref >60)
GFR calc non Af Amer: >60 mL/min (ref >60)
Glucose, Bld: 110 mg/dL — ABNORMAL HIGH (ref 65–99)
Potassium: 3.7 mmol/L (ref 3.5–5.1)
Sodium: 140 mmol/L (ref 135–145)
Total Bilirubin: 0.6 mg/dL (ref 0.3–1.2)
Total Protein: 5.6 g/dL — ABNORMAL LOW (ref 6.5–8.1)

## 2018-02-22 LAB — CBC
HCT: 36.5 % — ABNORMAL LOW (ref 39.0–52.0)
Hemoglobin: 12 g/dL — ABNORMAL LOW (ref 13.0–17.0)
MCH: 31.2 pg (ref 26.0–34.0)
MCHC: 32.9 g/dL (ref 30.0–36.0)
MCV: 94.8 fL (ref 78.0–100.0)
Platelets: 102 10*3/uL — ABNORMAL LOW (ref 150–400)
RBC: 3.85 MIL/uL — ABNORMAL LOW (ref 4.22–5.81)
RDW: 14 % (ref 11.5–15.5)
WBC: 2.9 10*3/uL — ABNORMAL LOW (ref 4.0–10.5)

## 2018-02-22 MED ORDER — CEFDINIR 300 MG PO CAPS
300.0000 mg | ORAL_CAPSULE | Freq: Two times a day (BID) | ORAL | Status: DC
  Administered 2018-02-22 – 2018-02-23 (×2): 300 mg via ORAL
  Filled 2018-02-22 (×2): qty 1

## 2018-02-22 MED ORDER — DOXYCYCLINE HYCLATE 100 MG PO TABS
100.0000 mg | ORAL_TABLET | Freq: Two times a day (BID) | ORAL | Status: DC
  Administered 2018-02-22 – 2018-02-23 (×2): 100 mg via ORAL
  Filled 2018-02-22 (×2): qty 1

## 2018-02-22 MED ORDER — VERAPAMIL HCL ER 240 MG PO TBCR
240.0000 mg | EXTENDED_RELEASE_TABLET | Freq: Every day | ORAL | Status: DC
  Administered 2018-02-22 – 2018-02-23 (×2): 240 mg via ORAL
  Filled 2018-02-22 (×2): qty 1

## 2018-02-22 NOTE — Progress Notes (Signed)
Physical Therapy Treatment Patient Details Name: Larry [name] MRN: [id number] DOB: 1951-02-28 Today's Date: 02/22/2018    History of Present Illness Larry [name] is a 67 y.o. male with medical history significant for hypertension, hypothyroidism, osteoarthritis with chronic pain, and degenerative disc disease status ost lumbar fusion last month, now presenting to the emergency department with fevers, chills, and abscess involving the lower left leg.  Patient reports that he was recovering well from his recent low back surgery when he accidentally kicked his bed frame with left shin ~4 days ago; he suffered an abrasion. He has continued to bear weight on the left leg without significant pain but noted increasing swelling and surrounding erythema, and then an abscess yesterday. He reports fevers for the past 2 days. s/p L3-ilieum hardware revision 3 weeks ago; s/p Rt TKA, s/p 2 Lt kene scopes. Patient reports 3 falls since LS surgery.    PT Comments    Pt progressing towards all goals. Pt remains to have L sided weakness and requires RW for safe ambulation. Pt has had 3 falls in the last month. Recommend out pt PT to address R LE weakness and balance impairment. Acute PT to con't to follow.   Follow Up Recommendations  Outpatient PT;Supervision for mobility/OOB     Equipment Recommendations       Recommendations for Other Services       Precautions / Restrictions Precautions Precautions: Back Precaution Booklet Issued: No Precaution Comments: pt able to recall and verbalize 3/3 back precautions Required Braces or Orthoses: Spinal Brace Spinal Brace: Lumbar corset Restrictions Weight Bearing Restrictions: No    Mobility  Bed Mobility Overal bed mobility: Modified Independent             General bed mobility comments: pt rolled to the L, pushed self up and maintained back precautions during transfer  Transfers Overall transfer level: Needs assistance Equipment  used: Rolling walker (2 wheeled) Transfers: Sit to/from Stand Sit to Stand: Supervision         General transfer comment: pt able to push up from bed, pt with good stability during transition of hands  Ambulation/Gait Ambulation/Gait assistance: Supervision Ambulation Distance (Feet): 200 Feet Assistive device: Rolling walker (2 wheeled) Gait Pattern/deviations: WFL(Within Functional Limits) Gait velocity: decreased   General Gait Details: v/c's to decrease bilat UE WBing and increase bilat LE WBing. discussed proper sequencing with a cane as pt reports he has increased pain with a cane   Stairs             Wheelchair Mobility    Modified Rankin (Stroke Patients Only)       Balance Overall balance assessment: Needs assistance Sitting-balance support: No upper extremity supported;Feet supported Sitting balance-Leahy Scale: Good     Standing balance support: Single extremity supported Standing balance-Leahy Scale: Fair Standing balance comment: pt stood at sink to brush teeth with L UE to steady self on counter, pt then used bilat hands to wash face and did lean up on counter with trunk                            Cognition Arousal/Alertness: Awake/alert Behavior During Therapy: WFL for tasks assessed/performed Overall Cognitive Status: Within Functional Limits for tasks assessed                                 General Comments: pt is easily  distracted, tangental speech, suspect pt "likes to talk" and may be lonely has pt has now been divorced for a year      Exercises      General Comments General comments (skin integrity, edema, etc.): pt with mild swelling in R foot      Pertinent Vitals/Pain Pain Assessment: Faces Faces Pain Scale: Hurts a little bit Pain Location: L knee Pain Descriptors / Indicators: Dull Pain Intervention(s): Monitored during session    Home Living                      Prior Function             PT Goals (current goals can now be found in the care plan section) Acute Rehab PT Goals Patient Stated Goal: stop the swelling and L knee pain Progress towards PT goals: Progressing toward goals    Frequency    Min 3X/week      PT Plan Current plan remains appropriate    Co-evaluation              AM-PAC PT "6 Clicks" Daily Activity  Outcome Measure  Difficulty turning over in bed (including adjusting bedclothes, sheets and blankets)?: None Difficulty moving from lying on back to sitting on the side of the bed? : None Difficulty sitting down on and standing up from a chair with arms (e.g., wheelchair, bedside commode, etc,.)?: None Help needed moving to and from a bed to chair (including a wheelchair)?: A Little Help needed walking in hospital room?: A Little Help needed climbing 3-5 steps with a railing? : A Little 6 Click Score: 21    End of Session Equipment Utilized During Treatment: Gait belt;Back brace Activity Tolerance: Patient tolerated treatment well Patient left: in chair;with call bell/phone within reach;with chair alarm set Nurse Communication: Mobility status PT Visit Diagnosis: Unsteadiness on feet (R26.81);Repeated falls (R29.6);Difficulty in walking, not elsewhere classified (R26.2)     Time: 9201-0071 PT Time Calculation (min) (ACUTE ONLY): 35 min  Charges:  $Gait Training: 8-22 mins $Therapeutic Activity: 8-22 mins                    G Codes:       Kittie Plater, PT, DPT Pager #: [phone] Office #: [phone]    [place] 02/22/2018, 8:40 AM

## 2018-02-22 NOTE — Progress Notes (Signed)
Bilateral lower extremity venous duplex completed. Preliminary results. There is no evvidence of DVT,superficial thrombosis, or Baker's cyst. Larry [name] 02/22/2018 12:34 PM

## 2018-02-22 NOTE — Progress Notes (Signed)
PROGRESS NOTE    Larry [name]  XNT:[id number] DOB: July 01, 1951 DOA: 02/16/2018 PCP: Merrilee Seashore, MD    Brief Narrative:  67 y.o. male with medical history significant for hypertension, hypothyroidism, osteoarthritis with chronic pain, and degenerative disc disease status post lumbar fusion last month, now presenting to the emergency department with fevers, chills, and abscess involving the lower left leg.  Patient reports that he was recovering well from his recent low back surgery when he accidentally kicked his bed frame with left shin ~4 days ago; he suffered an abrasion. He has continued to bear weight on the left leg without significant pain but noted increasing swelling and surrounding erythema, and then an abscess yesterday. He reports fevers for the past 2 days.   ED Course: Upon arrival to the ED, patient is found to be febrile to 39.1 C, slightly tachycardic, and with vitals otherwise normal.  Chest x-ray is negative for acute cardiopulmonary disease and radiographs of the left lower leg are negative for underlying osseous abnormality.  Chemistry panel is unremarkable and CBC features a leukocytosis to 12,000.  Lactic acid is reassuringly normal.  I&D was performed in the emergency department, blood and wound cultures were sent, 1 L of normal saline administered, Tdap updated, and patient was started on empiric vancomycin and Zosyn.  He remains hemodynamically stable and will be admitted for ongoing evaluation and management of sepsis secondary to skin/soft tissue infection.  Assessment & Plan:   Principal Problem:   Sepsis due to skin infection [facility]) Active Problems:   Hypothyroidism   Essential hypertension   Chronic pain  1. Sepsis secondary to skin/soft tissue infection  - Presented with abscess involving lower left leg and fevers  - Found to be febrile with leukocytosis and lower left leg abscess with surrounding cellulitis  - Plain films negative for underlying  osseous abnormality   - I&D was performed in ED, wound and blood cultures sent -Patient had been continued on vanc -Continued on vancomycin with improvement. Now afebrile -transition to PO doxycycline  2. Hypertension  - BP currently stable - Continue to monitor for now - currently stable  3. Hypothyroidism  - Will continue with Synthroid as tolerated  -Presently stable  4. Chronic low back pain  - Status-post lumbar fusion last month; reports progressing as expected without complication - Continue home regimen with prn Norco, Dilaudid, and Valium as tolerated - Currently stable   5. E.coli UTI - Urine cx pos for ecoli. Urinalysis suggestive of UTI -Transition to Bactrim which would cover also for staph cellulitis per above. -now on rocephin per sensitivities -No longer with fevers -Will transition to ceftin  6. Recent lumbar surgery - Followed by Dr. Ellene Route - Wound evaluated, appears to be healing. New dressings placed on 5/25 - remains stable at present.  7.  Pancytopenia -worsening -suspect secondary to acute infection -Improving now -Repeat cbc in AM  DVT prophylaxis: scd's Code Status: Full Family Communication: Pt in room, family not at bedside Disposition Plan: DC home when no longer febrile and infection improved  Consultants:     Procedures:   I/D of wound in ED  Antimicrobials: Anti-infectives (From admission, onward)   Start     Dose/Rate Route Frequency Ordered Stop   02/20/18 0000  vancomycin (VANCOCIN) IVPB 1000 mg/200 mL premix     1,000 mg 200 mL/hr over 60 Minutes Intravenous Every 12 hours 02/19/18 1154     02/19/18 1200  cefTRIAXone (ROCEPHIN) 1 g in sodium chloride 0.9 %  100 mL IVPB     1 g 200 mL/hr over 30 Minutes Intravenous Every 24 hours 02/19/18 1139     02/19/18 1200  vancomycin (VANCOCIN) 1,500 mg in sodium chloride 0.9 % 500 mL IVPB     1,500 mg 250 mL/hr over 120 Minutes Intravenous  Once 02/19/18 1154 02/19/18 1436    02/19/18 1100  cefdinir (OMNICEF) capsule 300 mg  Status:  Discontinued     300 mg Oral Every 12 hours 02/19/18 0953 02/19/18 1139   02/19/18 1100  doxycycline (VIBRA-TABS) tablet 100 mg  Status:  Discontinued     100 mg Oral Every 12 hours 02/19/18 0953 02/19/18 1139   02/18/18 1415  sulfamethoxazole-trimethoprim (BACTRIM DS,SEPTRA DS) 800-160 MG per tablet 1 tablet  Status:  Discontinued     1 tablet Oral Every 12 hours 02/18/18 1400 02/19/18 0953   02/17/18 1100  vancomycin (VANCOCIN) IVPB 1000 mg/200 mL premix  Status:  Discontinued     1,000 mg 200 mL/hr over 60 Minutes Intravenous Every 12 hours 02/16/18 2206 02/18/18 1400   02/16/18 2215  vancomycin (VANCOCIN) 1,500 mg in sodium chloride 0.9 % 500 mL IVPB     1,500 mg 250 mL/hr over 120 Minutes Intravenous  Once 02/16/18 2205 02/17/18 0105   02/16/18 2015  vancomycin (VANCOCIN) IVPB 1000 mg/200 mL premix  Status:  Discontinued     1,000 mg 200 mL/hr over 60 Minutes Intravenous  Once 02/16/18 2007 02/16/18 2205   02/16/18 2015  piperacillin-tazobactam (ZOSYN) IVPB 3.375 g     3.375 g 100 mL/hr over 30 Minutes Intravenous  Once 02/16/18 2007 02/16/18 2256      Subjective: Overall feeling much better today  Objective: Vitals:   02/21/18 2020 02/22/18 0549 02/22/18 1101 02/22/18 1356  BP: (!) 157/86 (!) 164/79 (!) 170/80 (!) 143/73  Pulse: 63 61  66  Resp: 18 18  18   Temp: 98.6 F (37 C)   97.9 F (36.6 C)  TempSrc: Rectal     SpO2: 98% 93%  100%  Weight:      Height:        Intake/Output Summary (Last 24 hours) at 02/22/2018 1502 Last data filed at 02/22/2018 1341 Gross per 24 hour  Intake 777.5 ml  Output 1250 ml  Net -472.5 ml   Filed Weights   02/17/18 0900  Weight: 89.8 kg (198 lb)    Examination: General exam: Awake, laying in bed, in nad Respiratory system: Normal respiratory effort, no wheezing Cardiovascular system: regular rate, s1, s2 Gastrointestinal system: Soft, nondistended, positive BS Central  nervous system: CN2-12 grossly intact, strength intact Extremities: Perfused, no clubbing Skin: Normal skin turgor, no notable skin lesions seen Psychiatry: Mood normal // no visual hallucinations    Data Reviewed: I have personally reviewed following labs and imaging studies  CBC: Recent Labs  Lab 02/16/18 2012 02/17/18 0549 02/18/18 0981 02/19/18 0414 02/20/18 0331 02/21/18 0357 02/22/18 0440  WBC 12.0* 13.7* 12.2* 7.1 2.7* 2.5* 2.9*  NEUTROABS 10.8* 12.3*  --   --   --   --   --   HGB 14.1 14.9 13.3 12.5* 12.7* 12.7* 12.0*  HCT 42.9 46.4 40.2 37.6* 39.1 39.1 36.5*  MCV 96.4 98.9 96.2 95.7 96.1 95.1 94.8  PLT 171 162 120* 93* 88* 89* 191*   Basic Metabolic Panel: Recent Labs  Lab 02/18/18 0632 02/19/18 0414 02/20/18 0331 02/21/18 0357 02/22/18 0440  NA 138 133* 131* 136 140  K 3.8 3.3* 4.0 3.8 3.7  CL 101 96* 97* 102 103  CO2 27 27 27 27 29   GLUCOSE 121* 112* 110* 110* 110*  BUN 12 11 12 11 9   CREATININE 0.94 1.05 1.11 0.88 0.89  CALCIUM 8.3* 8.3* 8.2* 8.5* 8.7*   GFR: Estimated Creatinine Clearance: 86.8 mL/min (by C-G formula based on SCr of 0.89 mg/dL). Liver Function Tests: Recent Labs  Lab 02/16/18 2012 02/20/18 0331 02/22/18 0440  AST 29 32 42*  ALT 23 21 30   ALKPHOS 75 67 58  BILITOT 1.5* 0.9 0.6  PROT 6.7 6.0* 5.6*  ALBUMIN 4.1 3.0* 2.8*   No results for input(s): LIPASE, AMYLASE in the last 168 hours. No results for input(s): AMMONIA in the last 168 hours. Coagulation Profile: No results for input(s): INR, PROTIME in the last 168 hours. Cardiac Enzymes: No results for input(s): CKTOTAL, CKMB, CKMBINDEX, TROPONINI in the last 168 hours. BNP (last 3 results) No results for input(s): PROBNP in the last 8760 hours. HbA1C: No results for input(s): HGBA1C in the last 72 hours. CBG: No results for input(s): GLUCAP in the last 168 hours. Lipid Profile: No results for input(s): CHOL, HDL, LDLCALC, TRIG, CHOLHDL, LDLDIRECT in the last 72  hours. Thyroid Function Tests: No results for input(s): TSH, T4TOTAL, FREET4, T3FREE, THYROIDAB in the last 72 hours. Anemia Panel: No results for input(s): VITAMINB12, FOLATE, FERRITIN, TIBC, IRON, RETICCTPCT in the last 72 hours. Sepsis Labs: Recent Labs  Lab 02/16/18 2034  LATICACIDVEN 1.41    Recent Results (from the past 240 hour(s))  Wound or Superficial Culture     Status: None   Collection Time: 02/16/18  8:15 PM  Result Value Ref Range Status   Specimen Description WOUND LEFT LOWER LEG  Final   Special Requests NONE  Final   Gram Stain   Final    RARE WBC PRESENT,BOTH PMN AND MONONUCLEAR MODERATE GRAM POSITIVE COCCI Performed at [facility], [address] [place], [place] [address]    Culture FEW METHICILLIN RESISTANT STAPHYLOCOCCUS AUREUS  Final   Report Status 02/19/2018 FINAL  Final   Organism ID, Bacteria METHICILLIN RESISTANT STAPHYLOCOCCUS AUREUS  Final      Susceptibility   Methicillin resistant staphylococcus aureus - MIC*    CIPROFLOXACIN >=8 RESISTANT Resistant     ERYTHROMYCIN >=8 RESISTANT Resistant     GENTAMICIN <=0.5 SENSITIVE Sensitive     OXACILLIN >=4 RESISTANT Resistant     TETRACYCLINE <=1 SENSITIVE Sensitive     VANCOMYCIN 1 SENSITIVE Sensitive     TRIMETH/SULFA <=10 SENSITIVE Sensitive     CLINDAMYCIN <=0.25 SENSITIVE Sensitive     RIFAMPIN <=0.5 SENSITIVE Sensitive     Inducible Clindamycin NEGATIVE Sensitive     * FEW METHICILLIN RESISTANT STAPHYLOCOCCUS AUREUS  Blood culture (routine x 2)     Status: None   Collection Time: 02/16/18  8:25 PM  Result Value Ref Range Status   Specimen Description BLOOD RIGHT ANTECUBITAL  Final   Special Requests   Final    BOTTLES DRAWN AEROBIC AND ANAEROBIC Blood Culture adequate volume   Culture   Final    NO GROWTH 5 DAYS Performed at [facility], [address] [place], [place] [address]    Report Status 02/21/2018 FINAL  Final  Blood culture (routine x 2)     Status: None    Collection Time: 02/16/18 10:27 PM  Result Value Ref Range Status   Specimen Description BLOOD LEFT ANTECUBITAL  Final   Special Requests   Final  BOTTLES DRAWN AEROBIC AND ANAEROBIC Blood Culture adequate volume   Culture   Final    NO GROWTH 5 DAYS Performed at [facility], [place] [address] [place], [place] [address]    Report Status 02/21/2018 FINAL  Final  Urine culture     Status: Abnormal   Collection Time: 02/16/18 11:22 PM  Result Value Ref Range Status   Specimen Description URINE, CLEAN CATCH  Final   Special Requests   Final    NONE Performed at [facility], [place] [address], [place], [place] [address]    Culture >=100,000 COLONIES/mL ESCHERICHIA COLI (A)  Final   Report Status 02/19/2018 FINAL  Final   Organism ID, Bacteria ESCHERICHIA COLI (A)  Final      Susceptibility   Escherichia coli - MIC*    AMPICILLIN >=32 RESISTANT Resistant     CEFAZOLIN >=64 RESISTANT Resistant     CEFTRIAXONE <=1 SENSITIVE Sensitive     CIPROFLOXACIN <=0.25 SENSITIVE Sensitive     GENTAMICIN <=1 SENSITIVE Sensitive     IMIPENEM <=0.25 SENSITIVE Sensitive     NITROFURANTOIN <=16 SENSITIVE Sensitive     TRIMETH/SULFA <=20 SENSITIVE Sensitive     AMPICILLIN/SULBACTAM >=32 RESISTANT Resistant     PIP/TAZO <=4 SENSITIVE Sensitive     Extended ESBL NEGATIVE Sensitive     * >=100,000 COLONIES/mL ESCHERICHIA COLI     Radiology Studies: Dg Foot Complete Right  Result Date: 02/22/2018 CLINICAL DATA:  Pain on the dorsum of the right foot. No known injury. EXAM: RIGHT FOOT COMPLETE - 3+ VIEW COMPARISON:  None. FINDINGS: No acute bony or joint abnormality is identified. Joint spaces and alignment are maintained. Mineralization is normal. No erosion or osteophytosis. Dorsal calcaneal spur is noted. Soft tissues are unremarkable. IMPRESSION: No acute abnormality or finding to explain the patient's foot pain. Dorsal calcaneal spur noted. Electronically Signed   By: Inge Rise M.D.    On: 02/22/2018 09:20    Scheduled Meds: . aspirin EC  81 mg Oral Daily  . latanoprost  1 drop Both Eyes QHS  . levothyroxine  150 mcg Oral QAC breakfast  . LORazepam  1 mg Oral QHS  . tamsulosin  0.4 mg Oral Daily  . Tdap  0.5 mL Intramuscular Once  . verapamil  240 mg Oral Daily   Continuous Infusions: . cefTRIAXone (ROCEPHIN)  IV Stopped (02/22/18 1156)  . vancomycin Stopped (02/22/18 1318)     LOS: 6 days   Marylu Lund, MD [facility] Pager [phone]  If 7PM-7AM, please contact night-coverage [email or web address] Password [facility] 02/22/2018, 3:02 PM

## 2018-02-22 NOTE — Care Management Note (Signed)
Case Management Note  Patient Details  Name: LOUI MASSENBURG MRN: [id number] Date of Birth: 06-Dec-1950  Subjective/Objective:     Admitted with sepsis 2/2 skin infection, hx of hypertension, hypothyroidism, osteoarthritis with chronic pain, and degenerative disc disease status ost lumbar fusion last month. From home alone.   PCP:  Merrilee Seashore   Action/Plan: Transition to home when medically stable. NCM following for disposition needs.  Expected Discharge Date:                  Expected Discharge Plan:  Home/Self Care  In-House Referral:     Discharge planning Services     Post Acute Care Choice:    Choice offered to:     DME Arranged:    DME Agency:     HH Arranged:    HH Agency:     Status of Service:  In process, will continue to follow  If discussed at Long Length of Stay Meetings, dates discussed:    Additional Comments:  Sharin Mons, RN 02/22/2018, 3:03 PM

## 2018-02-23 LAB — CBC
HCT: 39.6 % (ref 39.0–52.0)
Hemoglobin: 12.8 g/dL — ABNORMAL LOW (ref 13.0–17.0)
MCH: 30.8 pg (ref 26.0–34.0)
MCHC: 32.3 g/dL (ref 30.0–36.0)
MCV: 95.2 fL (ref 78.0–100.0)
Platelets: 153 10*3/uL (ref 150–400)
RBC: 4.16 MIL/uL — ABNORMAL LOW (ref 4.22–5.81)
RDW: 14 % (ref 11.5–15.5)
WBC: 4.6 10*3/uL (ref 4.0–10.5)

## 2018-02-23 LAB — BASIC METABOLIC PANEL
Anion gap: 8 (ref 5–15)
BUN: 9 mg/dL (ref 6–20)
CO2: 29 mmol/L (ref 22–32)
Calcium: 8.8 mg/dL — ABNORMAL LOW (ref 8.9–10.3)
Chloride: 102 mmol/L (ref 101–111)
Creatinine, Ser: 1 mg/dL (ref 0.61–1.24)
GFR calc Af Amer: >60 mL/min (ref >60)
GFR calc non Af Amer: >60 mL/min (ref >60)
Glucose, Bld: 127 mg/dL — ABNORMAL HIGH (ref 65–99)
Potassium: 3.7 mmol/L (ref 3.5–5.1)
Sodium: 139 mmol/L (ref 135–145)

## 2018-02-23 MED ORDER — SULFAMETHOXAZOLE-TRIMETHOPRIM 800-160 MG PO TABS: 1.0000 | ORAL_TABLET | Freq: Two times a day (BID) | ORAL | 0 refills | Status: AC

## 2018-02-23 NOTE — Progress Notes (Signed)
Discharge instructions, RX's and follow up appts explained and provided to patient.  Jarman Litton, Tivis Ringer, RN

## 2018-02-23 NOTE — Progress Notes (Signed)
Physical Therapy Treatment Patient Details Name: Larry [name] MRN: [id number] DOB: 1950/10/20 Today's Date: 02/23/2018    History of Present Illness Larry [name] is a 67 y.o. male with medical history significant for hypertension, hypothyroidism, osteoarthritis with chronic pain, and degenerative disc disease status ost lumbar fusion last month, now presenting to the emergency department with fevers, chills, and abscess involving the lower left leg.  Patient reports that he was recovering well from his recent low back surgery when he accidentally kicked his bed frame with left shin ~4 days ago; he suffered an abrasion. He has continued to bear weight on the left leg without significant pain but noted increasing swelling and surrounding erythema, and then an abscess yesterday. He reports fevers for the past 2 days. s/p L3-ilieum hardware revision 3 weeks ago; s/p Rt TKA, s/p 2 Lt kene scopes. Patient reports 3 falls since LS surgery.    PT Comments    Plan for discharge home today. Session focused on gait training and reviewing fall prevention/safety at home. Verbalized understanding. Recommending outpatient PT to maximize patient's functional strength and independence.     Follow Up Recommendations  Outpatient PT;Supervision for mobility/OOB     Equipment Recommendations  None recommended by PT    Recommendations for Other Services       Precautions / Restrictions Precautions Precautions: Back Precaution Booklet Issued: No Precaution Comments: pt able to recall and verbalize 3/3 back precautions Required Braces or Orthoses: Spinal Brace Spinal Brace: Lumbar corset Restrictions Weight Bearing Restrictions: No    Mobility  Bed Mobility               General bed mobility comments: sitting EOB  Transfers Overall transfer level: Modified independent Equipment used: Rolling walker (2 wheeled);None Transfers: Sit to/from Stand Sit to Stand: Modified independent  (Device/Increase time)            Ambulation/Gait Ambulation/Gait assistance: Supervision;Modified independent (Device/Increase time) Ambulation Distance (Feet): 350 Feet Assistive device: Rolling walker (2 wheeled) Gait Pattern/deviations: WFL(Within Functional Limits) Gait velocity: decreased   General Gait Details: Cueing for upright posture and decreased bilateral UE weightbearing through RW.   Stairs Stairs: Yes       General stair comments: discussed stepping up onto the step with stronger leg and down with weaker leg   Wheelchair Mobility    Modified Rankin (Stroke Patients Only)       Balance Overall balance assessment: Needs assistance Sitting-balance support: No upper extremity supported;Feet supported Sitting balance-Leahy Scale: Good     Standing balance support: No upper extremity supported;During functional activity Standing balance-Leahy Scale: Good                              Cognition Arousal/Alertness: Awake/alert Behavior During Therapy: WFL for tasks assessed/performed Overall Cognitive Status: Within Functional Limits for tasks assessed                                 General Comments: easily distracted      Exercises      General Comments General comments (skin integrity, edema, etc.): reviewed fall safety/precautions including not sitting in chairs with four wheels at home (i.e. desk chairs) and wearing socks/shoes with tractions      Pertinent Vitals/Pain Pain Assessment: No/denies pain    Home Living  Prior Function            PT Goals (current goals can now be found in the care plan section) Acute Rehab PT Goals Potential to Achieve Goals: Good Progress towards PT goals: Progressing toward goals    Frequency    Min 3X/week      PT Plan Current plan remains appropriate    Co-evaluation              AM-PAC PT "6 Clicks" Daily Activity  Outcome  Measure  Difficulty turning over in bed (including adjusting bedclothes, sheets and blankets)?: None Difficulty moving from lying on back to sitting on the side of the bed? : None Difficulty sitting down on and standing up from a chair with arms (e.g., wheelchair, bedside commode, etc,.)?: None Help needed moving to and from a bed to chair (including a wheelchair)?: A Little Help needed walking in hospital room?: A Little Help needed climbing 3-5 steps with a railing? : A Little 6 Click Score: 21    End of Session Equipment Utilized During Treatment: Gait belt;Back brace Activity Tolerance: Patient tolerated treatment well Patient left: with call bell/phone within reach;with chair alarm set;Other (comment)(seated EOB) Nurse Communication: Mobility status PT Visit Diagnosis: Unsteadiness on feet (R26.81);Repeated falls (R29.6);Difficulty in walking, not elsewhere classified (R26.2)     Time: 4010-2725 PT Time Calculation (min) (ACUTE ONLY): 18 min  Charges:  $Gait Training: 8-22 mins                    G Codes:      Ellamae Sia, PT, DPT Acute Rehabilitation Services  Pager: [place] 02/23/2018, 1:24 PM

## 2018-02-23 NOTE — Progress Notes (Signed)
Occupational Therapy Treatment Patient Details Name: Larry [name] MRN: [id number] DOB: 03/13/1951 Today's Date: 02/23/2018    History of present illness Larry [name] is a 67 y.o. male with medical history significant for hypertension, hypothyroidism, osteoarthritis with chronic pain, and degenerative disc disease status ost lumbar fusion last month, now presenting to the emergency department with fevers, chills, and abscess involving the lower left leg.  Patient reports that he was recovering well from his recent low back surgery when he accidentally kicked his bed frame with left shin ~4 days ago; he suffered an abrasion. He has continued to bear weight on the left leg without significant pain but noted increasing swelling and surrounding erythema, and then an abscess yesterday. He reports fevers for the past 2 days. s/p L3-ilieum hardware revision 3 weeks ago; s/p Rt TKA, s/p 2 Lt kene scopes. Patient reports 3 falls since LS surgery.   OT comments  Pt progressing well towards OT goals. Completing room level functional mobility with RW and standing grooming ADLs with overall supervision. Pt demonstrating good understanding of use of AE for completing LB ADLs. Pt does require min cues for safety and to adhere to back precautions but with overall good recall from previous sessions. Will continue to follow acutely to progress pt towards established OT goals.   Follow Up Recommendations  No OT follow up;Supervision/Assistance - 24 hour(24hr initially )    Equipment Recommendations  3 in 1 bedside commode          Precautions / Restrictions Precautions Precautions: Back Precaution Booklet Issued: No Precaution Comments: pt able to recall and verbalize 3/3 back precautions Required Braces or Orthoses: Spinal Brace Spinal Brace: Lumbar corset Restrictions Weight Bearing Restrictions: No       Mobility Bed Mobility Overal bed mobility: Modified Independent              General bed mobility comments: sitting EOB  Transfers Overall transfer level: Modified independent Equipment used: Rolling walker (2 wheeled);None Transfers: Sit to/from Stand Sit to Stand: Modified independent (Device/Increase time)              Balance Overall balance assessment: Needs assistance Sitting-balance support: No upper extremity supported;Feet supported Sitting balance-Leahy Scale: Good     Standing balance support: No upper extremity supported;During functional activity Standing balance-Leahy Scale: Good                             ADL either performed or assessed with clinical judgement   ADL Overall ADL's : Needs assistance/impaired     Grooming: Supervision/safety;Wash/dry face;Oral care;Standing Grooming Details (indicate cue type and reason): pt with good recall of compensatory techniques for completing oral care, min cues for maintaining back precautions          Upper Body Dressing : Set up;Sitting Upper Body Dressing Details (indicate cue type and reason): to don lumbar brace  Lower Body Dressing: Min guard;Sit to/from stand;With adaptive equipment Lower Body Dressing Details (indicate cue type and reason): pt using sock aide to don socks seated EOB and demonstrates good understanding of use of reacher for donning LB clothing              Functional mobility during ADLs: Supervision/safety;Rolling walker General ADL Comments: pt initially getting up and walking in room without back brace or RW while therapist was donning contact gown once entering room despite therapists requests to wait for therapist, no LOB with mobility, reinforced safety  and use of spinal brace and RW when up and moving                        Cognition Arousal/Alertness: Awake/alert Behavior During Therapy: WFL for tasks assessed/performed Overall Cognitive Status: Within Functional Limits for tasks assessed                                  General Comments: easily distracted; min verbal safety cues and to adhere to back precautions                     General Comments reviewed fall safety/precautions including not sitting in chairs with four wheels at home (i.e. desk chairs) and wearing socks/shoes with tractions    Pertinent Vitals/ Pain       Pain Assessment: No/denies pain                                                          Frequency  Min 2X/week        Progress Toward Goals  OT Goals(current goals can now be found in the care plan section)  Progress towards OT goals: Progressing toward goals  Acute Rehab OT Goals Patient Stated Goal: return home hopefully soon  OT Goal Formulation: With patient Time For Goal Achievement: 03/04/18 Potential to Achieve Goals: Good  Plan Discharge plan remains appropriate                     AM-PAC PT "6 Clicks" Daily Activity     Outcome Measure   Help from another person eating meals?: None Help from another person taking care of personal grooming?: A Little Help from another person toileting, which includes using toliet, bedpan, or urinal?: A Little Help from another person bathing (including washing, rinsing, drying)?: A Little Help from another person to put on and taking off regular upper body clothing?: A Little Help from another person to put on and taking off regular lower body clothing?: A Little 6 Click Score: 19    End of Session Equipment Utilized During Treatment: Rolling walker;Back brace  OT Visit Diagnosis: History of falling (Z91.81);Other abnormalities of gait and mobility (R26.89)   Activity Tolerance Patient tolerated treatment well   Patient Left with call bell/phone within reach;Other (comment)(sitting EOB talking to MD )   Nurse Communication Mobility status        Time: 4967-5916 OT Time Calculation (min): 26 min  Charges: OT General Charges $OT Visit: 1 Visit OT Treatments $Self  Care/Home Management : 23-37 mins  Lou Cal, [place] Pager [phone] 02/23/2018   Raymondo Band 02/23/2018, 1:35 PM

## 2018-02-23 NOTE — Discharge Summary (Signed)
Discharge Summary  Larry [name] TMH:[id number] DOB: 16-Jun-1951  PCP: Merrilee Seashore, MD  Admit date: 02/16/2018 Discharge date: 02/23/2018  Time spent: <4mins  Recommendations for Outpatient Follow-up:  1. F/u with PMD within a week  for hospital discharge follow up, repeat cbc/bmp at follow up 2. F/u with Dr Ellene Route for chronic lumbar radiculopathy with recent back surgery 3. Outpatient PT   Discharge Diagnoses:  Active Hospital Problems   Diagnosis Date Noted  . Sepsis due to skin infection ([place]) 02/16/2018  . Essential hypertension 02/16/2018  . Chronic pain 02/16/2018  . Hypothyroidism 07/25/2015    Resolved Hospital Problems  No resolved problems to display.    Discharge Condition: stable  Diet recommendation: heart healthy  Filed Weights   02/17/18 0900  Weight: 89.8 kg (198 lb)    History of present illness: (per admitting MD Dr Myna Hidalgo) PCP: Merrilee Seashore, MD   Patient coming from: Home  Chief Complaint: fevers, left shin abscess  HPI: Larry [name] is a 67 y.o. male with medical history significant for hypertension, hypothyroidism, osteoarthritis with chronic pain, and degenerative disc disease status post lumbar fusion last month, now presenting to the emergency department with fevers, chills, and abscess involving the lower left leg.  Patient reports that he was recovering well from his recent low back surgery when he accidentally kicked his bed frame with left shin ~4 days ago; he suffered an abrasion. He has continued to bear weight on the left leg without significant pain but noted increasing swelling and surrounding erythema, and then an abscess yesterday. He reports fevers for the past 2 days.   ED Course: Upon arrival to the ED, patient is found to be febrile to 39.1 C, slightly tachycardic, and with vitals otherwise normal.  Chest x-ray is negative for acute cardiopulmonary disease and radiographs of the left lower leg are negative  for underlying osseous abnormality.  Chemistry panel is unremarkable and CBC features a leukocytosis to 12,000.  Lactic acid is reassuringly normal.  I&D was performed in the emergency department, blood and wound cultures were sent, 1 L of normal saline administered, Tdap updated, and patient was started on empiric vancomycin and Zosyn.  He remains hemodynamically stable and will be admitted for ongoing evaluation and management of sepsis secondary to skin/soft tissue infection.    Hospital Course:  Principal Problem:   Sepsis due to skin infection [facility]) Active Problems:   Hypothyroidism   Essential hypertension   Chronic pain   Sepsis secondary to skin/soft tissue infection -Presented with abscess involving lower left leg and fevers -Found to be febrile with leukocytosis and lower left leg abscess with surrounding cellulitis  - Plain films negative for underlying osseous abnormality -I&D was performed in ED, wound culture + mrsa, blood cultures no growth -Patient had been treated with vanc in the hospital, wound has much improved, he is discharged on bactrim to finish abx treatment  E.coli UTI - Urine cx pos for ecoli. Urinalysis suggestive of UTI -Transition to Bactrim which would cover also for staph cellulitis per above.  Pancytopenia --suspect secondary to acute infection -Improving   Hypertension -BP currently stable on verapamil - Continue to monitor for now - follow up with pmd  Hypothyroidism -Will continue with Synthroid as tolerated -Presently stable  Chroniclow backpain/Recent lumbar surgery - Status-post lumbar fusion last month; reports progressing as expected without complication -Continue home regimen with prn Norco, Dilaudid, and Valium as tolerated - Currently stable  F/u with Dr Benedetto Coons, outpatient PT  DVT prophylaxis: scd's Code Status: Full Family Communication: Pt in room, family not at bedside Disposition Plan: DC home    Consultants:   none  Procedures:   I/D of wound in ED   Discharge Exam: BP (!) 160/73   Pulse (!) 57   Temp 97.7 F (36.5 C) (Oral)   Resp 17   Ht 5\' 6"  (1.676 m)   Wt 89.8 kg (198 lb)   SpO2 95%   BMI 31.96 kg/m   General: NAD Cardiovascular: RRR Respiratory: CTABL Extremity: left anterior leg cellulitis has much improved, no edema, no drainage, mild resolving erythema   Discharge Instructions You were cared for by a hospitalist during your hospital stay. If you have any questions about your discharge medications or the care you received while you were in the hospital after you are discharged, you can call the unit and asked to speak with the hospitalist on call if the hospitalist that took care of you is not available. Once you are discharged, your primary care physician will handle any further medical issues. Please note that NO REFILLS for any discharge medications will be authorized once you are discharged, as it is imperative that you return to your primary care physician (or establish a relationship with a primary care physician if you do not have one) for your aftercare needs so that they can reassess your need for medications and monitor your lab values.  Discharge Instructions    Ambulatory referral to Physical Therapy   Complete by:  As directed    Diet - low sodium heart healthy   Complete by:  As directed    Increase activity slowly   Complete by:  As directed      Allergies as of 02/23/2018   No Known Allergies     Medication List    STOP taking these medications   diazepam 5 MG tablet Commonly known as:  VALIUM     TAKE these medications   AFRIN NASAL SPRAY NA Place 2 sprays into both nostrils 2 (two) times daily as needed (nasal congestion).   aspirin EC 81 MG tablet Take 81 mg by mouth daily.   furosemide 40 MG tablet Commonly known as:  LASIX Take 40 mg by mouth daily as needed for edema.   HYDROcodone-acetaminophen 5-325 MG  tablet Commonly known as:  NORCO/VICODIN Take 1 tablet by mouth every 6 (six) hours as needed for pain.   HYDROmorphone 2 MG tablet Commonly known as:  DILAUDID Take 1 tablet (2 mg total) by mouth every 4 (four) hours as needed for severe pain.   latanoprost 0.005 % ophthalmic solution Commonly known as:  XALATAN Place 1 drop into both eyes at bedtime.   levothyroxine 150 MCG tablet Commonly known as:  SYNTHROID, LEVOTHROID Take 150 mcg by mouth daily before breakfast.   LORazepam 0.5 MG tablet Commonly known as:  ATIVAN Take 1 mg by mouth at bedtime.   MULTIVITAMIN PO Take 1 tablet by mouth daily.   sulfamethoxazole-trimethoprim 800-160 MG tablet Commonly known as:  BACTRIM DS,SEPTRA DS Take 1 tablet by mouth 2 (two) times daily for 3 days.   tamsulosin 0.4 MG Caps capsule Commonly known as:  FLOMAX Take 0.4 mg by mouth daily.   testosterone cypionate 200 MG/ML injection Commonly known as:  DEPOTESTOSTERONE CYPIONATE Inject 100 mg into the muscle every 14 (fourteen) days.   VAYACOG 100-19.5-6.5 MG Caps Generic drug:  Phosphatidylserine-DHA-EPA Take 1 capsule by mouth daily.   verapamil 180 MG CR tablet  Commonly known as:  CALAN-SR Take 180 mg by mouth daily.   VIAGRA PO Take 30 mg by mouth daily as needed (for erectile dysfunction). Purchased from [employer]--dose indicated on website as 30 mg      No Known Allergies Follow-up Information    Merrilee Seashore, MD Follow up in 1 week(s).   Specialty:  Internal Medicine Why:  hospital discharge follow up Contact information: [address] [place] [address] [phone]            The results of significant diagnostics from this hospitalization (including imaging, microbiology, ancillary and laboratory) are listed below for reference.    Significant Diagnostic Studies: Dg Chest 2 View  Result Date: 02/16/2018 CLINICAL DATA:  Fever EXAM: CHEST - 2 VIEW COMPARISON:  03/18/2016  FINDINGS: Postsurgical changes of the cervical spine. Mild cardiomegaly. No acute airspace disease or pleural effusion. Degenerative changes of the spine. Ovoid opacity anteriorly on the lateral view may reflect prominent cartilaginous calcification or an artifact. Metallic clip left lateral lower chest wall. IMPRESSION: No active cardiopulmonary disease.  Mild cardiomegaly. Electronically Signed   By: Donavan Foil M.D.   On: 02/16/2018 21:22   Dg Lumbar Spine 2-3 Views  Result Date: 01/24/2018 CLINICAL DATA:  Surgery EXAM: LUMBAR SPINE - 2-3 VIEW; DG C-ARM 61-120 MIN COMPARISON:  CT 12/25/2017 FINDINGS: Fluoroscopic intraoperative images document inferior extension of fusion surgery with placement of graft markers in the L5-S1 interspace and bilateral posterior hardware into the S1 segment and iliac wings with vertical interconnecting rods. IMPRESSION: 1. Extension of PLIF across L5-S1 as above. Electronically Signed   By: Lucrezia Europe M.D.   On: 01/24/2018 16:46   Dg Tibia/fibula Left  Result Date: 02/16/2018 CLINICAL DATA:  Cellulitis EXAM: LEFT TIBIA AND FIBULA - 2 VIEW COMPARISON:  None. FINDINGS: No acute displaced fracture or malalignment. No periostitis or bone destruction. Diffuse soft tissue edema. No foreign body or soft tissue gas. IMPRESSION: No acute osseous abnormality Electronically Signed   By: Donavan Foil M.D.   On: 02/16/2018 21:23   Dg Foot Complete Right  Result Date: 02/22/2018 CLINICAL DATA:  Pain on the dorsum of the right foot. No known injury. EXAM: RIGHT FOOT COMPLETE - 3+ VIEW COMPARISON:  None. FINDINGS: No acute bony or joint abnormality is identified. Joint spaces and alignment are maintained. Mineralization is normal. No erosion or osteophytosis. Dorsal calcaneal spur is noted. Soft tissues are unremarkable. IMPRESSION: No acute abnormality or finding to explain the patient's foot pain. Dorsal calcaneal spur noted. Electronically Signed   By: Inge Rise M.D.   On:  02/22/2018 09:20   Dg C-arm 1-60 Min  Result Date: 01/24/2018 CLINICAL DATA:  Surgery EXAM: LUMBAR SPINE - 2-3 VIEW; DG C-ARM 61-120 MIN COMPARISON:  CT 12/25/2017 FINDINGS: Fluoroscopic intraoperative images document inferior extension of fusion surgery with placement of graft markers in the L5-S1 interspace and bilateral posterior hardware into the S1 segment and iliac wings with vertical interconnecting rods. IMPRESSION: 1. Extension of PLIF across L5-S1 as above. Electronically Signed   By: Lucrezia Europe M.D.   On: 01/24/2018 16:46    Microbiology: Recent Results (from the past 240 hour(s))  Wound or Superficial Culture     Status: None   Collection Time: 02/16/18  8:15 PM  Result Value Ref Range Status   Specimen Description WOUND LEFT LOWER LEG  Final   Special Requests NONE  Final   Gram Stain   Final    RARE WBC PRESENT,BOTH  PMN AND MONONUCLEAR MODERATE GRAM POSITIVE COCCI Performed at [facility], [place] [address] [place], [place] [address]    Culture FEW METHICILLIN RESISTANT STAPHYLOCOCCUS AUREUS  Final   Report Status 02/19/2018 FINAL  Final   Organism ID, Bacteria METHICILLIN RESISTANT STAPHYLOCOCCUS AUREUS  Final      Susceptibility   Methicillin resistant staphylococcus aureus - MIC*    CIPROFLOXACIN >=8 RESISTANT Resistant     ERYTHROMYCIN >=8 RESISTANT Resistant     GENTAMICIN <=0.5 SENSITIVE Sensitive     OXACILLIN >=4 RESISTANT Resistant     TETRACYCLINE <=1 SENSITIVE Sensitive     VANCOMYCIN 1 SENSITIVE Sensitive     TRIMETH/SULFA <=10 SENSITIVE Sensitive     CLINDAMYCIN <=0.25 SENSITIVE Sensitive     RIFAMPIN <=0.5 SENSITIVE Sensitive     Inducible Clindamycin NEGATIVE Sensitive     * FEW METHICILLIN RESISTANT STAPHYLOCOCCUS AUREUS  Blood culture (routine x 2)     Status: None   Collection Time: 02/16/18  8:25 PM  Result Value Ref Range Status   Specimen Description BLOOD RIGHT ANTECUBITAL  Final   Special Requests   Final    BOTTLES DRAWN AEROBIC AND  ANAEROBIC Blood Culture adequate volume   Culture   Final    NO GROWTH 5 DAYS Performed at [facility], [address] [place], [place] [address]    Report Status 02/21/2018 FINAL  Final  Blood culture (routine x 2)     Status: None   Collection Time: 02/16/18 10:27 PM  Result Value Ref Range Status   Specimen Description BLOOD LEFT ANTECUBITAL  Final   Special Requests   Final    BOTTLES DRAWN AEROBIC AND ANAEROBIC Blood Culture adequate volume   Culture   Final    NO GROWTH 5 DAYS Performed at [facility], [place] [address] [place], [place] [address]    Report Status 02/21/2018 FINAL  Final  Urine culture     Status: Abnormal   Collection Time: 02/16/18 11:22 PM  Result Value Ref Range Status   Specimen Description URINE, CLEAN CATCH  Final   Special Requests   Final    NONE Performed at [facility], [place] [address], [place], [place] [address]    Culture >=100,000 COLONIES/mL ESCHERICHIA COLI (A)  Final   Report Status 02/19/2018 FINAL  Final   Organism ID, Bacteria ESCHERICHIA COLI (A)  Final      Susceptibility   Escherichia coli - MIC*    AMPICILLIN >=32 RESISTANT Resistant     CEFAZOLIN >=64 RESISTANT Resistant     CEFTRIAXONE <=1 SENSITIVE Sensitive     CIPROFLOXACIN <=0.25 SENSITIVE Sensitive     GENTAMICIN <=1 SENSITIVE Sensitive     IMIPENEM <=0.25 SENSITIVE Sensitive     NITROFURANTOIN <=16 SENSITIVE Sensitive     TRIMETH/SULFA <=20 SENSITIVE Sensitive     AMPICILLIN/SULBACTAM >=32 RESISTANT Resistant     PIP/TAZO <=4 SENSITIVE Sensitive     Extended ESBL NEGATIVE Sensitive     * >=100,000 COLONIES/mL ESCHERICHIA COLI     Labs: Basic Metabolic Panel: Recent Labs  Lab 02/19/18 0414 02/20/18 0331 02/21/18 0357 02/22/18 0440 02/23/18 0446  NA 133* 131* 136 140 139  K 3.3* 4.0 3.8 3.7 3.7  CL 96* 97* 102 103 102  CO2 27 27 27 29 29   GLUCOSE 112* 110* 110* 110* 127*  BUN 11 12 11 9 9   CREATININE 1.05 1.11 0.88 0.89 1.00  CALCIUM  8.3* 8.2* 8.5*  8.7* 8.8*   Liver Function Tests: Recent Labs  Lab 02/16/18 2012 02/20/18 0331 02/22/18 0440  AST 29 32 42*  ALT 23 21 30   ALKPHOS 75 67 58  BILITOT 1.5* 0.9 0.6  PROT 6.7 6.0* 5.6*  ALBUMIN 4.1 3.0* 2.8*   No results for input(s): LIPASE, AMYLASE in the last 168 hours. No results for input(s): AMMONIA in the last 168 hours. CBC: Recent Labs  Lab 02/16/18 2012 02/17/18 0549  02/19/18 0414 02/20/18 0331 02/21/18 0357 02/22/18 0440 02/23/18 0446  WBC 12.0* 13.7*   < > 7.1 2.7* 2.5* 2.9* 4.6  NEUTROABS 10.8* 12.3*  --   --   --   --   --   --   HGB 14.1 14.9   < > 12.5* 12.7* 12.7* 12.0* 12.8*  HCT 42.9 46.4   < > 37.6* 39.1 39.1 36.5* 39.6  MCV 96.4 98.9   < > 95.7 96.1 95.1 94.8 95.2  PLT 171 162   < > 93* 88* 89* 102* 153   < > = values in this interval not displayed.   Cardiac Enzymes: No results for input(s): CKTOTAL, CKMB, CKMBINDEX, TROPONINI in the last 168 hours. BNP: BNP (last 3 results) No results for input(s): BNP in the last 8760 hours.  ProBNP (last 3 results) No results for input(s): PROBNP in the last 8760 hours.  CBG: No results for input(s): GLUCAP in the last 168 hours.     Signed:  Florencia Reasons MD, PhD  [facility] 02/23/2018, 12:42 PM

## 2018-03-03 DIAGNOSIS — E039 Hypothyroidism, unspecified: Secondary | ICD-10-CM | POA: Diagnosis not present

## 2018-03-03 DIAGNOSIS — M9983 Other biomechanical lesions of lumbar region: Secondary | ICD-10-CM | POA: Diagnosis not present

## 2018-03-03 DIAGNOSIS — Z6828 Body mass index (BMI) 28.0-28.9, adult: Secondary | ICD-10-CM | POA: Diagnosis not present

## 2018-03-03 DIAGNOSIS — I10 Essential (primary) hypertension: Secondary | ICD-10-CM | POA: Diagnosis not present

## 2018-03-03 DIAGNOSIS — R030 Elevated blood-pressure reading, without diagnosis of hypertension: Secondary | ICD-10-CM | POA: Diagnosis not present

## 2018-03-03 DIAGNOSIS — N390 Urinary tract infection, site not specified: Secondary | ICD-10-CM | POA: Diagnosis not present

## 2018-03-03 DIAGNOSIS — A419 Sepsis, unspecified organism: Secondary | ICD-10-CM | POA: Diagnosis not present

## 2018-03-09 DIAGNOSIS — M79671 Pain in right foot: Secondary | ICD-10-CM | POA: Diagnosis not present

## 2018-03-09 DIAGNOSIS — R399 Unspecified symptoms and signs involving the genitourinary system: Secondary | ICD-10-CM | POA: Diagnosis not present

## 2018-03-09 DIAGNOSIS — M5416 Radiculopathy, lumbar region: Secondary | ICD-10-CM | POA: Diagnosis not present

## 2018-03-15 ENCOUNTER — Other Ambulatory Visit: Payer: Self-pay

## 2018-03-15 ENCOUNTER — Ambulatory Visit: Payer: Medicare Other | Attending: Internal Medicine

## 2018-03-15 DIAGNOSIS — G8929 Other chronic pain: Secondary | ICD-10-CM

## 2018-03-15 DIAGNOSIS — R2681 Unsteadiness on feet: Secondary | ICD-10-CM | POA: Diagnosis not present

## 2018-03-15 DIAGNOSIS — M6281 Muscle weakness (generalized): Secondary | ICD-10-CM

## 2018-03-15 DIAGNOSIS — M545 Low back pain: Secondary | ICD-10-CM | POA: Insufficient documentation

## 2018-03-15 NOTE — Therapy (Signed)
[place], [place], [address] Phone: [phone]   Fax:  [phone]  Physical Therapy Evaluation  Patient Details  Name: Larry [name] MRN: [id number] Date of Birth: 09-Jan-1951 Referring Provider: Kristeen Miss, MD   Encounter Date: 03/15/2018  PT End of Session - 03/15/18 0659    Visit Number  1    Number of Visits  16    Date for PT Re-Evaluation  05/06/18    PT Start Time  0658    PT Stop Time  0740    PT Time Calculation (min)  42 min    Equipment Utilized During Treatment  Back brace    Activity Tolerance  Patient tolerated treatment well    Behavior During Therapy  [facility] for tasks assessed/performed       Past Medical History:  Diagnosis Date  . Arthritis    "knees; left shoulder" (04/12/2013)  . Asthma    "as a child" (04/12/2013)  . DDD (degenerative disc disease), cervical   . DDD (degenerative disc disease), lumbar   . Difficult intubation    2012   CERVICAL FUSION   . Headache(784.0)    VERAPAMIL FOR PREVENTION   . 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 History of chicken pox    as an adult  . Hypoglycemia    last episode 1 yr. ago, just gets jittery  . Hypothyroidism   . Lumbar stenosis   . OSA (obstructive sleep apnea)    "haven't wore mask in years; had OR; still snore" (04/12/2013)  . Urinary frequency     Past Surgical History:  Procedure Laterality Date  . BACK SURGERY    . CERVICAL FUSION  2002; 2012  . CYSTECTOMY    . INGUINAL HERNIA REPAIR Bilateral 1997  . KNEE ARTHROSCOPY Left ~ 1997; ~ 2005  . LUMBAR FUSION  2007; 04/11/2013  . PENILE DEBRIDEMENT  ~ 2011   X 3  FOR MRSA INFECTION    . SHOULDER ARTHROSCOPY Left ~ 2003  . TONSILLECTOMY  1990's  . TOTAL KNEE ARTHROPLASTY Right 07/23/2015  . TOTAL KNEE ARTHROPLASTY Right 07/23/2015   Procedure: TOTAL KNEE ARTHROPLASTY;   Surgeon: Garald Balding, MD;  Location: [place];  Service: Orthopedics;  Laterality: Right;  . UVULOPALATOPHARYNGOPLASTY (UPPP)/TONSILLECTOMY/SEPTOPLASTY  1990's  . VASECTOMY      There were no vitals filed for this visit.   Subjective Assessment - 03/15/18 0705    Subjective  He reports post lumbar fusion  L5 to S1  with revision due to LBP DDD.   Used walker until a week or 2 ago.       Fell using cane post surgery.  LT knee makes stairs difficult.   Legs weak    Pertinent History  back surgery x5, fused L3 to sacrum, open wound in Lt lower leg due to mersa in hosp 7 days,   RT knee TKA  , LT knee OA, cervical fusion    Patient Stated Goals  He wants to incr strength in LE.     Currently in Pain?  Yes    Pain Score  4     Pain Location  Back    Pain Orientation  Right;Left;Lower    Pain Descriptors / Indicators  Aching    Pain Type  Surgical pain    Pain Onset  More than a  month ago    Pain Frequency  Intermittent    Aggravating Factors   moving    Pain Relieving Factors  rest, meds         [facility] PT Assessment - 03/15/18 0001      Assessment   Medical Diagnosis  Lumbar fusion    Referring Provider  Kristeen Miss, MD    Onset Date/Surgical Date  01/24/18    Hand Dominance  Right    Next MD Visit  03/15/18    Prior Therapy  None      Precautions   Precaution Comments  lifting limit 15 pounds. 9 pt report up to 6 months)    Required Braces or Orthoses  Spinal Brace      Restrictions   Weight Bearing Restrictions  No      Balance Screen   Has the patient fallen in the past 6 months  Yes    How many times?  1 using SPC post surgery, stepping up step caught LT foot    Has the patient had a decrease in activity level because of a fear of falling?   -- from surgery    Is the patient reluctant to leave their home because of a fear of falling?   No      Home Social worker  Private residence    [place] to enter     Entrance Stairs-Number of Steps  3    Entrance Stairs-Rails  Right;Left;Can reach both    [place]  One level    [place] - 2 wheels;Cane - single point      Prior Function   Level of Independence  Independent      Cognition   Overall Cognitive Status  Within Functional Limits for tasks assessed      Observation/Other Assessments   Focus on Therapeutic Outcomes (FOTO)   54% limited      ROM / Strength   AROM / PROM / Strength  AROM;PROM;Strength      AROM   AROM Assessment Site  Lumbar      PROM   Overall PROM Comments  hip abduciton 20 degrees bilaterally      Strength   Strength Assessment Site  Hip;Knee;Ankle    Right/Left Hip  Right;Left    Right Hip Flexion  4+/5    Right Hip Extension  4/5    Right Hip External Rotation   5/5    Right Hip Internal Rotation  3+/5    Right Hip ABduction  4/5    Left Hip Flexion  4/5    Left Hip Extension  5/5    Left Hip External Rotation  5/5    Left Hip Internal Rotation  4+/5    Left Hip ABduction  4+/5    Right/Left Knee  Right;Left    Right Knee Flexion  -- 5-/5    Right Knee Extension  -- 5-/5    Left Knee Flexion  -- 5-/5    Left Knee Extension  -- 5-/5    Right/Left Ankle  Left;Right    Right Ankle Dorsiflexion  3+/5    Right Ankle Plantar Flexion  3+/5    Right Ankle Inversion  3+/5    Right Ankle Eversion  2-/5    Left Ankle Dorsiflexion  3+/5    Left Ankle Plantar Flexion  3/5    Left Ankle Inversion  4/5  Left Ankle Eversion  5/5      Flexibility   Soft Tissue Assessment /Muscle Length  yes    Hamstrings  50 degrees bilaterally    Quadriceps  prone knee lfexion RT 80 degrees   LT 95 degrees       Ambulation/Gait   Gait Comments  Decr trunk rotation , foot slap RT        Standardized Balance Assessment   Standardized Balance Assessment  -- Single leg stand Lt 4 sec max   RT   2 sec                Objective measurements completed on examination: See above findings.               PT Education - 03/15/18 0702    Education Details  POC    Person(s) Educated  Patient    Methods  Explanation    Comprehension  Verbalized understanding       PT Short Term Goals - 03/15/18 0701      PT SHORT TERM GOAL #1   Title  He will be independent with iniital HEp    Time  4    Period  Weeks    Status  New      PT SHORT TERM GOAL #2   Title  He will report greater ease with walking and lifting leg up to dry leg    Time  4    Period  Weeks    Status  New      PT SHORT TERM GOAL #4   Title  Single leg balance improved to 5 sec RT and LT leg    Time  4    Period  Weeks    Status  New        PT Long Term Goals - 03/15/18 0701      PT LONG TERM GOAL #1   Title  He will be independent wih all HEP issued    Time  8    Period  Weeks    Status  New      PT LONG TERM GOAL #2   Title  He will have max 3/10 LBP with home tasks    Time  8    Period  Weeks    Status  New      PT LONG TERM GOAL #3   Title  He will have 3/10 max pain doing normal home tasks  without brace if allowed by MD.     Time  8    Period  Weeks    Status  New      PT LONG TERM GOAL #4   Title  He will e able to walk 2-3 mile with no incr pain.     Time  8    Period  Weeks    Status  New      PT LONG TERM GOAL #5   Title  FOTO score will improve to 42% limited todemo incr functional activity    Time  8    Period  Weeks    Status  New             Plan - 03/15/18 0700    Clinical Impression Statement  Larry [name] is post lumbar fusion. He is progress ing with mobility but is limited by LE weakness.  He has had multiple spinal surgeries and a TKA . He is limited in walking and lifting . He demo LE  weakness most in hips na dankle.s He is not allowed to bed or twist and must be in brace.   He would benefit fromLe strength an stretching to improve mobility .     History and Personal Factors relevant to plan of care:  multiple spinal surgeries , tKA, Post surgery  LE MERSA infection    Clinical Presentation due to:  post lunbbar fusion and hardware revision.     Clinical Decision Making  Moderate    Rehab Potential  Good    PT Frequency  2x / week    PT Duration  8 weeks    PT Treatment/Interventions  Electrical Stimulation;Moist Heat;Therapeutic exercise;Patient/family education;Manual techniques;Dry needling    PT Next Visit Plan  Progress HEP . Manual and modalities    Consulted and Agree with Plan of Care  Patient       Patient will benefit from skilled therapeutic intervention in order to improve the following deficits and impairments:  Pain  Visit Diagnosis: Chronic bilateral low back pain without sciatica - Plan: PT plan of care cert/re-cert  Muscle weakness (generalized) - Plan: PT plan of care cert/re-cert     Problem List Patient Active Problem List   Diagnosis Date Noted  . Essential hypertension 02/16/2018  . Sepsis due to skin infection ([place]) 02/16/2018  . Chronic pain 02/16/2018  . Cellulitis of left lower extremity   . Lumbar radiculopathy, chronic 01/24/2018  . Lumbar stenosis with neurogenic claudication 03/23/2017  . Hypothyroidism 07/25/2015  . Primary osteoarthritis of right knee 07/23/2015  . Primary osteoarthritis of knee 07/23/2015    Darrel Hoover  PT 03/15/2018, 8:01 AM  [facility] [address] [place], [place], [address] Phone: [phone]   Fax:  [phone]  Name: Larry [name] MRN: [id number] Date of Birth: 1950-11-03

## 2018-03-17 ENCOUNTER — Telehealth: Payer: Self-pay | Admitting: Physical Therapy

## 2018-03-17 NOTE — Telephone Encounter (Signed)
Not able to understand voice mail and phone hung up after.

## 2018-03-22 ENCOUNTER — Ambulatory Visit: Payer: Medicare Other

## 2018-03-22 DIAGNOSIS — R2681 Unsteadiness on feet: Secondary | ICD-10-CM

## 2018-03-22 DIAGNOSIS — M6281 Muscle weakness (generalized): Secondary | ICD-10-CM

## 2018-03-22 DIAGNOSIS — G8929 Other chronic pain: Secondary | ICD-10-CM

## 2018-03-22 DIAGNOSIS — M545 Low back pain: Principal | ICD-10-CM

## 2018-03-22 NOTE — Therapy (Signed)
[place], [place], [address] Phone: [phone]   Fax:  [phone]  Physical Therapy Treatment  Patient Details  Name: Larry [name] MRN: [id number] Date of Birth: 30-Sep-1950 Referring Provider: Kristeen Miss, MD   Encounter Date: 03/22/2018  PT End of Session - 03/22/18 0700    Visit Number  2    Number of Visits  16    Date for PT Re-Evaluation  05/06/18    PT Start Time  0700    PT Stop Time  0740    PT Time Calculation (min)  40 min    Activity Tolerance  Patient tolerated treatment well    Behavior During Therapy  [facility] for tasks assessed/performed       Past Medical History:  Diagnosis Date  . Arthritis    "knees; left shoulder" (04/12/2013)  . Asthma    "as a child" (04/12/2013)  . DDD (degenerative disc disease), cervical   . DDD (degenerative disc disease), lumbar   . Difficult intubation    2012   CERVICAL FUSION   . Headache(784.0)    VERAPAMIL FOR PREVENTION   . 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 History of chicken pox    as an adult  . Hypoglycemia    last episode 1 yr. ago, just gets jittery  . Hypothyroidism   . Lumbar stenosis   . OSA (obstructive sleep apnea)    "haven't wore mask in years; had OR; still snore" (04/12/2013)  . Urinary frequency     Past Surgical History:  Procedure Laterality Date  . BACK SURGERY    . CERVICAL FUSION  2002; 2012  . CYSTECTOMY    . INGUINAL HERNIA REPAIR Bilateral 1997  . KNEE ARTHROSCOPY Left ~ 1997; ~ 2005  . LUMBAR FUSION  2007; 04/11/2013  . PENILE DEBRIDEMENT  ~ 2011   X 3  FOR MRSA INFECTION    . SHOULDER ARTHROSCOPY Left ~ 2003  . TONSILLECTOMY  1990's  . TOTAL KNEE ARTHROPLASTY Right 07/23/2015  . TOTAL KNEE ARTHROPLASTY Right 07/23/2015   Procedure: TOTAL KNEE ARTHROPLASTY;  Surgeon: Garald Balding, MD;  Location: [place];   Service: Orthopedics;  Laterality: Right;  . UVULOPALATOPHARYNGOPLASTY (UPPP)/TONSILLECTOMY/SEPTOPLASTY  1990's  . VASECTOMY      There were no vitals filed for this visit.  Subjective Assessment - 03/22/18 0701    Subjective  MD appointment next month.   Did not see MD.   Hanley Seamen them the card.  Walking mile to 1.5 miles  total  per day. Walk uphill more stenuous.  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  Says won't be lifting    Currently in Pain?  Yes    Pain Score  4     Pain Location  Back    Pain Orientation  Right;Left;Lower    Pain Descriptors / Indicators  Aching    Pain Type  Surgical pain    Pain Onset  More than a month ago    Pain Frequency  Intermittent    Aggravating Factors   activity    Pain Relieving Factors  rest, Meds                       [facility] Adult PT Treatment/Exercise - 03/22/18 0001      Neuro Re-ed    Neuro Re-ed  Details   Stand on foam with  side forward and back vectors. Walking with narrow BOS in bars with fett parallel to edge of line      Exercises   Exercises  Knee/Hip      Knee/Hip Exercises: Stretches   Other Knee/Hip Stretches  Stand hip extension stretching and adductor stretch      Knee/Hip Exercises: Aerobic   Nustep  Le L3 5 min       Knee/Hip Exercises: Standing   Heel Raises  Both;15 reps    Hip Abduction  Right;Left;15 reps    Abduction Limitations  cued to not sidbend back    Forward Step Up  Right;Left;10 reps;Hand Hold: 2;Step Height: 8"    Forward Step Up Limitations  cued to not pull / use legs as much as able    Wall Squat  10 reps       HEP issued      PT Education - 03/22/18 0734    Education Details  HEP    Person(s) Educated  Patient    Methods  Explanation;Demonstration;Verbal cues;Handout    Comprehension  Returned demonstration;Verbalized understanding       PT Short Term Goals - 03/15/18 0701      PT SHORT TERM GOAL #1   Title  He will be independent with iniital HEp    Time  4     Period  Weeks    Status  New      PT SHORT TERM GOAL #2   Title  He will report greater ease with walking and lifting leg up to dry leg    Time  4    Period  Weeks    Status  New      PT SHORT TERM GOAL #4   Title  Single leg balance improved to 5 sec RT and LT leg    Time  4    Period  Weeks    Status  New        PT Long Term Goals - 03/15/18 0701      PT LONG TERM GOAL #1   Title  He will be independent wih all HEP issued    Time  8    Period  Weeks    Status  New      PT LONG TERM GOAL #2   Title  He will have max 3/10 LBP with home tasks    Time  8    Period  Weeks    Status  New      PT LONG TERM GOAL #3   Title  He will have 3/10 max pain doing normal home tasks  without brace if allowed by MD.     Time  8    Period  Weeks    Status  New      PT LONG TERM GOAL #4   Title  He will e able to walk 2-3 mile with no incr pain.     Time  8    Period  Weeks    Status  New      PT LONG TERM GOAL #5   Title  FOTO score will improve to 42% limited todemo incr functional activity    Time  8    Period  Weeks    Status  New            Plan - 03/22/18 0701    Clinical Impression Statement  No changes . Encouraged to progress distance  with each walk at home. No iincr pain with session. Balance decr and needd cues to not bend spine with exercise.       PT Treatment/Interventions  Electrical Stimulation;Moist Heat;Therapeutic exercise;Patient/family education;Manual techniques;Dry needling    PT Next Visit Plan  Progress HEP . Manual and modalities if needed but will plan on no back activity specifically     PT Home Exercise Plan  tandem balance , heel reaises, hip abduction stand, wall sit , hip ext stretch (runners) and adductor stretching in standing    Consulted and Agree with Plan of Care  Patient       Patient will benefit from skilled therapeutic intervention in order to improve the following deficits and impairments:  Decreased strength  Visit  Diagnosis: Chronic bilateral low back pain without sciatica  Muscle weakness (generalized)  Unsteadiness on feet     Problem List Patient Active Problem List   Diagnosis Date Noted  . Essential hypertension 02/16/2018  . Sepsis due to skin infection ([place]) 02/16/2018  . Chronic pain 02/16/2018  . Cellulitis of left lower extremity   . Lumbar radiculopathy, chronic 01/24/2018  . Lumbar stenosis with neurogenic claudication 03/23/2017  . Hypothyroidism 07/25/2015  . Primary osteoarthritis of right knee 07/23/2015  . Primary osteoarthritis of knee 07/23/2015    Darrel Hoover  PT 03/22/2018, 7:51 AM  [facility] [address] [place], [place], [address] Phone: [phone]   Fax:  [phone]  Name: Larry [name] MRN: [id number] Date of Birth: 07-Feb-1951

## 2018-03-22 NOTE — Patient Instructions (Signed)
Tandem Stance    Right foot in front of left, heel touching toe both feet "straight ahead". Stand on Foot Triangle of Support with both feet. Balance in this position _as many __ seconds as able. Do with left foot in front of right.   Feet shoulde be only as wide as you can balance  Copyright  VHI. All rights reserved.  Wall Sit    Back against wall, slide down so knees are at 90 angle. Hold _5-10___ seconds. Do _1-2__ sets. Complete _10___ repetitions.  http://st.exer.us/294   Copyright  VHI. All rights reserved.  HIP: Abduction - Standing    Squeeze glutes. Raise leg out and slightly back.  RT/LT _10-15__ reps per set, _1-2__ sets per day, ___ days per week Hold onto a support.  Copyright  VHI. All rights reserved.  Heel Raises    Stand with support. Tighten pelvic floor and hold. With knees straight, raise heels off ground. Hold _1-3__ seconds. Relax for _1-3__ seconds. Repeat _15__ times. Do _1-2__ times a day.  Copyright  VHI. All rights reserved.  Hip Stretch    With feet parallel, bend front leg, keeping knee in line with ankle. Lean into the stretch, keeping back leg straight. Push hips forward slightly. Hold _2-30___ seconds. Change legs and repeat. Repeat __2-10__ times. Do _2___ sessions per day.  http://gt2.exer.us/814   Copyright  VHI. All rights reserved.  Groin Stretch    Hands on hips, back straight, feet parallel and 4"-6" greater than shoulder width apart. Slowly shift weight to one side, keeping bent knee directly over foot. Hold ___2-30_ seconds. Repeat to other side.   2-10 reps  Copyright  VHI. All rights reserved.

## 2018-03-23 ENCOUNTER — Ambulatory Visit: Payer: Medicare Other | Admitting: Physical Therapy

## 2018-03-23 ENCOUNTER — Encounter: Payer: Self-pay | Admitting: Physical Therapy

## 2018-03-23 DIAGNOSIS — R2681 Unsteadiness on feet: Secondary | ICD-10-CM | POA: Diagnosis not present

## 2018-03-23 DIAGNOSIS — M6281 Muscle weakness (generalized): Secondary | ICD-10-CM

## 2018-03-23 DIAGNOSIS — M545 Low back pain: Secondary | ICD-10-CM | POA: Diagnosis not present

## 2018-03-23 DIAGNOSIS — G8929 Other chronic pain: Secondary | ICD-10-CM

## 2018-03-23 NOTE — Therapy (Signed)
[place], [place], [address] Phone: [phone]   Fax:  [phone]  Physical Therapy Treatment  Patient Details  Name: Larry [name] MRN: [id number] Date of Birth: Jan 24, 1951 Referring Provider: Kristeen Miss, MD   Encounter Date: 03/23/2018  PT End of Session - 03/23/18 0805    Visit Number  3    Number of Visits  16    Date for PT Re-Evaluation  05/06/18    PT Start Time  0800    PT Stop Time  0845    PT Time Calculation (min)  45 min       Past Medical History:  Diagnosis Date  . Arthritis    "knees; left shoulder" (04/12/2013)  . Asthma    "as a child" (04/12/2013)  . DDD (degenerative disc disease), cervical   . DDD (degenerative disc disease), lumbar   . Difficult intubation    2012   CERVICAL FUSION   . Headache(784.0)    VERAPAMIL FOR PREVENTION   . 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 History of chicken pox    as an adult  . Hypoglycemia    last episode 1 yr. ago, just gets jittery  . Hypothyroidism   . Lumbar stenosis   . OSA (obstructive sleep apnea)    "haven't wore mask in years; had OR; still snore" (04/12/2013)  . Urinary frequency     Past Surgical History:  Procedure Laterality Date  . BACK SURGERY    . CERVICAL FUSION  2002; 2012  . CYSTECTOMY    . INGUINAL HERNIA REPAIR Bilateral 1997  . KNEE ARTHROSCOPY Left ~ 1997; ~ 2005  . LUMBAR FUSION  2007; 04/11/2013  . PENILE DEBRIDEMENT  ~ 2011   X 3  FOR MRSA INFECTION    . SHOULDER ARTHROSCOPY Left ~ 2003  . TONSILLECTOMY  1990's  . TOTAL KNEE ARTHROPLASTY Right 07/23/2015  . TOTAL KNEE ARTHROPLASTY Right 07/23/2015   Procedure: TOTAL KNEE ARTHROPLASTY;  Surgeon: Garald Balding, MD;  Location: [place];  Service: Orthopedics;  Laterality: Right;  . UVULOPALATOPHARYNGOPLASTY (UPPP)/TONSILLECTOMY/SEPTOPLASTY  1990's  .  VASECTOMY      There were no vitals filed for this visit.  Subjective Assessment - 03/23/18 0805    Subjective  Standing at post office for 20 minutes was torture.     Currently in Pain?  Yes    Pain Score  0-No pain    Pain Location  Back    Pain Orientation  Right;Left;Lower    Pain Radiating Towards  both feet on bottom as well as right top of foot and lateral foot     Aggravating Factors   standing proonged     Pain Relieving Factors  rest, meds                        [facility] Adult PT Treatment/Exercise - 03/23/18 0001      Lumbar Exercises: Supine   Ab Set  10 reps    Clam  20 reps    Clam Limitations  blue , gentle ab set     Straight Leg Raise  10 reps    Other Supine Lumbar Exercises  DF supine red band 10 x 2 each      Knee/Hip Exercises: Stretches   Gastroc Stretch  2 reps;30 seconds runners  Knee/Hip Exercises: Aerobic   Nustep  LE x 5 min Level 5       Knee/Hip Exercises: Standing   Heel Raises  Both;15 reps and toe raises    Hip Abduction  Right;Left;15 reps    Abduction Limitations  cues for abdominals    Functional Squat  10 reps    Other Standing Knee Exercises  tandem balance trials  cuees for abdominals       Knee/Hip Exercises: Sidelying   Clams  x 10 each     Other Sidelying Knee/Hip Exercises  Reverse clam             PT Education - 03/22/18 0734    Education Details  HEP    Person(s) Educated  Patient    Methods  Explanation;Demonstration;Verbal cues;Handout    Comprehension  Returned demonstration;Verbalized understanding       PT Short Term Goals - 03/15/18 0701      PT SHORT TERM GOAL #1   Title  He will be independent with iniital HEp    Time  4    Period  Weeks    Status  New      PT SHORT TERM GOAL #2   Title  He will report greater ease with walking and lifting leg up to dry leg    Time  4    Period  Weeks    Status  New      PT SHORT TERM GOAL #4   Title  Single leg balance improved to 5 sec RT  and LT leg    Time  4    Period  Weeks    Status  New        PT Long Term Goals - 03/15/18 0701      PT LONG TERM GOAL #1   Title  He will be independent wih all HEP issued    Time  8    Period  Weeks    Status  New      PT LONG TERM GOAL #2   Title  He will have max 3/10 LBP with home tasks    Time  8    Period  Weeks    Status  New      PT LONG TERM GOAL #3   Title  He will have 3/10 max pain doing normal home tasks  without brace if allowed by MD.     Time  8    Period  Weeks    Status  New      PT LONG TERM GOAL #4   Title  He will e able to walk 2-3 mile with no incr pain.     Time  8    Period  Weeks    Status  New      PT LONG TERM GOAL #5   Title  FOTO score will improve to 42% limited todemo incr functional activity    Time  8    Period  Weeks    Status  New            Plan - 03/23/18 0857    Clinical Impression Statement  No back pain, only pain in bottom of feet and right ankle/foot  reported with prolonged activity on feet. Reviewed exercises from last visit. He c/o right foot slap with gait. Began Df strengthening supine with theraband verses seated with ankle weight on forefoot. Began sidelying hip ER /Ir strengthening with right side weakness noted. After stadning exercises  he reports feet felt better.     PT Next Visit Plan  Progress HEP . Manual and modalities if needed but will plan on no back activity specifically , add clams and reverse clams, add DF strength to HEP    PT Home Exercise Plan  tandem balance , heel reaises, hip abduction stand, wall sit , hip ext stretch (runners) and adductor stretching in standing    Consulted and Agree with Plan of Care  Patient       Patient will benefit from skilled therapeutic intervention in order to improve the following deficits and impairments:  Decreased strength  Visit Diagnosis: Chronic bilateral low back pain without sciatica  Muscle weakness (generalized)  Unsteadiness on  feet     Problem List Patient Active Problem List   Diagnosis Date Noted  . Essential hypertension 02/16/2018  . Sepsis due to skin infection ([place]) 02/16/2018  . Chronic pain 02/16/2018  . Cellulitis of left lower extremity   . Lumbar radiculopathy, chronic 01/24/2018  . Lumbar stenosis with neurogenic claudication 03/23/2017  . Hypothyroidism 07/25/2015  . Primary osteoarthritis of right knee 07/23/2015  . Primary osteoarthritis of knee 07/23/2015    Dorene Ar, [place] 03/23/2018, 9:02 AM  [place], [place], [address] Phone: [phone]   Fax:  [phone]  Name: Larry [name] MRN: [id number] Date of Birth: [birth year]

## 2018-03-25 ENCOUNTER — Encounter

## 2018-03-28 ENCOUNTER — Ambulatory Visit: Payer: Medicare Other | Attending: Internal Medicine

## 2018-03-28 DIAGNOSIS — M6281 Muscle weakness (generalized): Secondary | ICD-10-CM

## 2018-03-28 DIAGNOSIS — G8929 Other chronic pain: Secondary | ICD-10-CM | POA: Diagnosis not present

## 2018-03-28 DIAGNOSIS — R2681 Unsteadiness on feet: Secondary | ICD-10-CM | POA: Diagnosis not present

## 2018-03-28 DIAGNOSIS — M545 Low back pain: Secondary | ICD-10-CM | POA: Insufficient documentation

## 2018-03-28 NOTE — Therapy (Signed)
[place], [place], [address] Phone: [phone]   Fax:  [phone]  Physical Therapy Treatment  Patient Details  Name: Larry [name] MRN: [id number] Date of Birth: 06/23/1951 Referring Provider: Kristeen Miss, MD   Encounter Date: 03/28/2018  PT End of Session - 03/28/18 0702    Visit Number  4    Number of Visits  16    Date for PT Re-Evaluation  05/06/18    PT Start Time  0700    PT Stop Time  0743    PT Time Calculation (min)  43 min    Equipment Utilized During Treatment  Back brace    Activity Tolerance  Patient tolerated treatment well    Behavior During Therapy  [facility] for tasks assessed/performed       Past Medical History:  Diagnosis Date  . Arthritis    "knees; left shoulder" (04/12/2013)  . Asthma    "as a child" (04/12/2013)  . DDD (degenerative disc disease), cervical   . DDD (degenerative disc disease), lumbar   . Difficult intubation    2012   CERVICAL FUSION   . Headache(784.0)    VERAPAMIL FOR PREVENTION   . 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 History of chicken pox    as an adult  . Hypoglycemia    last episode 1 yr. ago, just gets jittery  . Hypothyroidism   . Lumbar stenosis   . OSA (obstructive sleep apnea)    "haven't wore mask in years; had OR; still snore" (04/12/2013)  . Urinary frequency     Past Surgical History:  Procedure Laterality Date  . BACK SURGERY    . CERVICAL FUSION  2002; 2012  . CYSTECTOMY    . INGUINAL HERNIA REPAIR Bilateral 1997  . KNEE ARTHROSCOPY Left ~ 1997; ~ 2005  . LUMBAR FUSION  2007; 04/11/2013  . PENILE DEBRIDEMENT  ~ 2011   X 3  FOR MRSA INFECTION    . SHOULDER ARTHROSCOPY Left ~ 2003  . TONSILLECTOMY  1990's  . TOTAL KNEE ARTHROPLASTY Right 07/23/2015  . TOTAL KNEE ARTHROPLASTY Right 07/23/2015   Procedure: TOTAL KNEE ARTHROPLASTY;   Surgeon: Garald Balding, MD;  Location: [place];  Service: Orthopedics;  Laterality: Right;  . UVULOPALATOPHARYNGOPLASTY (UPPP)/TONSILLECTOMY/SEPTOPLASTY  1990's  . VASECTOMY      There were no vitals filed for this visit.  Subjective Assessment - 03/28/18 0706    Subjective  Doing well today mild pain in AM.     Pain Score  -- no number given    Pain Location  Back    Pain Orientation  Lower    Pain Descriptors / Indicators  Aching    Pain Type  Surgical pain    Pain Onset  More than a month ago    Pain Frequency  Intermittent    Aggravating Factors   standing    Pain Relieving Factors  rest, meds                       [facility] Adult PT Treatment/Exercise - 03/28/18 0001      Neuro Re-ed    Neuro Re-ed Details   . Walking with narrow BOS in bars with feet parallel to edge of line x 15 feet x 8       Lumbar Exercises: Supine  Ab Set  Limitations    AB Set Limitations  Workred on ab set with pulling lower ribs to hips  and to add breathing to exerc si and to do in sit and stand for at home      Knee/Hip Exercises: Stretches   Gastroc Stretch  Both;1 rep;60 seconds and1 rep 45 sec      Knee/Hip Exercises: Aerobic   Nustep  LE x 5 min Level 5       Knee/Hip Exercises: Standing   Heel Raises  Both;15 reps and toes     Hip Flexion  Right;Left;20 reps    Hip Abduction  Right;Left;15 reps    Abduction Limitations  with squat , bars for support    Lateral Step Up  Right;Left;10 reps;Hand Hold: 2;Step Height: 8"    Forward Step Up  --    Functional Squat  15 reps with side step       Knee/Hip Exercises: Seated   Long Arc Quad  Right;Left;15 reps;Weights    Long Arc Quad Weight  3 lbs.      Knee/Hip Exercises: Sidelying   Clams  x12 rT/LT  then reverse clam x 12             PT Education - 03/28/18 0747    Education Details  HEP with abdominal set and exhale pursed lip and cues to bringe lower ribs to pelvis in supine, sitting , standing and walking  with holds of varing duration      Person(s) Educated  Patient    Methods  Explanation;Demonstration;Verbal cues;Handout;Tactile cues    Comprehension  Verbalized understanding;Returned demonstration       PT Short Term Goals - 03/28/18 1610      PT SHORT TERM GOAL #1   Title  He will be independent with iniital HEp    Status  Achieved      PT SHORT TERM GOAL #2   Title  He will report greater ease with walking and lifting leg up to dry leg    Status  On-going      PT SHORT TERM GOAL #4   Title  Single leg balance improved to 5 sec RT and LT leg    Status  Unable to assess        PT Long Term Goals - 03/15/18 0701      PT LONG TERM GOAL #1   Title  He will be independent wih all HEP issued    Time  8    Period  Weeks    Status  New      PT LONG TERM GOAL #2   Title  He will have max 3/10 LBP with home tasks    Time  8    Period  Weeks    Status  New      PT LONG TERM GOAL #3   Title  He will have 3/10 max pain doing normal home tasks  without brace if allowed by MD.     Time  8    Period  Weeks    Status  New      PT LONG TERM GOAL #4   Title  He will e able to walk 2-3 mile with no incr pain.     Time  8    Period  Weeks    Status  New      PT LONG TERM GOAL #5   Title  FOTO score will improve to 42% limited todemo incr  functional activity    Time  8    Period  Weeks    Status  New            Plan - 03/28/18 1308    Clinical Impression Statement  Good abdominals set post cuing . Contniue weakness with RT leg > LT and foot weakness in DF with walking.  Progressing slowy but o incr pain    PT Treatment/Interventions  Electrical Stimulation;Moist Heat;Therapeutic exercise;Patient/family education;Manual techniques;Dry needling    PT Next Visit Plan  Progress HEP . Manual and modalities if needed but will plan on no back activity specifically , add clams and reverse clams, add DF strength to HEP    PT Home Exercise Plan  tandem balance , heel raises,  hip abduction stand, wall sit , hip ext stretch (runners) and adductor stretching in standing, Abdominal set with exhaling in various positions.     Consulted and Agree with Plan of Care  Patient       Patient will benefit from skilled therapeutic intervention in order to improve the following deficits and impairments:  Decreased strength, Pain, Decreased balance  Visit Diagnosis: Chronic bilateral low back pain without sciatica  Muscle weakness (generalized)  Unsteadiness on feet     Problem List Patient Active Problem List   Diagnosis Date Noted  . Essential hypertension 02/16/2018  . Sepsis due to skin infection ([place]) 02/16/2018  . Chronic pain 02/16/2018  . Cellulitis of left lower extremity   . Lumbar radiculopathy, chronic 01/24/2018  . Lumbar stenosis with neurogenic claudication 03/23/2017  . Hypothyroidism 07/25/2015  . Primary osteoarthritis of right knee 07/23/2015  . Primary osteoarthritis of knee 07/23/2015    Darrel Hoover  PT 03/28/2018, 7:55 AM  [facility] [address] [place], [place], [address] Phone: [phone]   Fax:  [phone]  Name: ANYELO MCCUE MRN: [id number] Date of Birth: Aug 21, 1951

## 2018-03-29 ENCOUNTER — Ambulatory Visit: Payer: Medicare Other

## 2018-03-29 DIAGNOSIS — M6281 Muscle weakness (generalized): Secondary | ICD-10-CM | POA: Diagnosis not present

## 2018-03-29 DIAGNOSIS — R2681 Unsteadiness on feet: Secondary | ICD-10-CM

## 2018-03-29 DIAGNOSIS — G8929 Other chronic pain: Secondary | ICD-10-CM

## 2018-03-29 DIAGNOSIS — M545 Low back pain: Secondary | ICD-10-CM | POA: Diagnosis not present

## 2018-03-29 NOTE — Therapy (Signed)
[place], [place], [address] Phone: [phone]   Fax:  [phone]  Physical Therapy Treatment  Patient Details  Name: Larry [name] MRN: [id number] Date of Birth: 05-09-1951 Referring Provider: Kristeen Miss, MD   Encounter Date: 03/29/2018  PT End of Session - 03/29/18 0704    Visit Number  5    Number of Visits  16    Date for PT Re-Evaluation  05/06/18    PT Start Time  0704    PT Stop Time  0745    PT Time Calculation (min)  41 min    Equipment Utilized During Treatment  Back brace    Activity Tolerance  Patient tolerated treatment well    Behavior During Therapy  [facility] for tasks assessed/performed       Past Medical History:  Diagnosis Date  . Arthritis    "knees; left shoulder" (04/12/2013)  . Asthma    "as a child" (04/12/2013)  . DDD (degenerative disc disease), cervical   . DDD (degenerative disc disease), lumbar   . Difficult intubation    2012   CERVICAL FUSION   . Headache(784.0)    VERAPAMIL FOR PREVENTION   . 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 History of chicken pox    as an adult  . Hypoglycemia    last episode 1 yr. ago, just gets jittery  . Hypothyroidism   . Lumbar stenosis   . OSA (obstructive sleep apnea)    "haven't wore mask in years; had OR; still snore" (04/12/2013)  . Urinary frequency     Past Surgical History:  Procedure Laterality Date  . BACK SURGERY    . CERVICAL FUSION  2002; 2012  . CYSTECTOMY    . INGUINAL HERNIA REPAIR Bilateral 1997  . KNEE ARTHROSCOPY Left ~ 1997; ~ 2005  . LUMBAR FUSION  2007; 04/11/2013  . PENILE DEBRIDEMENT  ~ 2011   X 3  FOR MRSA INFECTION    . SHOULDER ARTHROSCOPY Left ~ 2003  . TONSILLECTOMY  1990's  . TOTAL KNEE ARTHROPLASTY Right 07/23/2015  . TOTAL KNEE ARTHROPLASTY Right 07/23/2015   Procedure: TOTAL KNEE ARTHROPLASTY;   Surgeon: Garald Balding, MD;  Location: [place];  Service: Orthopedics;  Laterality: Right;  . UVULOPALATOPHARYNGOPLASTY (UPPP)/TONSILLECTOMY/SEPTOPLASTY  1990's  . VASECTOMY      There were no vitals filed for this visit.  Subjective Assessment - 03/29/18 0744    Subjective  Feels better balance. Doing HEP.  Feels burning pain  improving    Currently in Pain?  Yes    Pain Score  3     Pain Location  Back    Pain Orientation  Left;Right;Lower    Pain Descriptors / Indicators  Aching    Pain Type  Surgical pain    Pain Onset  More than a month ago    Pain Frequency  Intermittent                       [facility] Adult PT Treatment/Exercise - 03/29/18 0001      Neuro Re-ed    Neuro Re-ed Details   . Walking with narrow BOS in bars with feet parallel to edge of line x 15 feet x 4 then walk with hip flexed 2-3 sec each RT and LT       Knee/Hip  Exercises: Aerobic   Nustep  LE x 5 min Level 5       Knee/Hip Exercises: Standing   Heel Raises  Both;20 reps    Hip Abduction  Right;Left;15 reps    Abduction Limitations  with squat , bars for support    Lateral Step Up  Right;Left;20 reps;Hand Hold: 1;Step Height: 4"    Lunge Walking - Round Trips  RT and Lt in bars x 4 trips    SLS with Vectors  RT/LT x 8-10 on therafoam       Knee/Hip Exercises: Seated   Long Arc Quad  Right;Left;15 reps;Weights    Long Arc Quad Weight  4 lbs.      Knee/Hip Exercises: Sidelying   Clams  x20 rT/LT  green band then reverse clam x 20             PT Education - 03/29/18 0749    Education Details  With model education on fixation not in hips but in pelvis    Person(s) Educated  Patient    Methods  Explanation    Comprehension  Verbalized understanding       PT Short Term Goals - 03/28/18 8527      PT SHORT TERM GOAL #1   Title  He will be independent with iniital HEp    Status  Achieved      PT SHORT TERM GOAL #2   Title  He will report greater ease with walking and  lifting leg up to dry leg    Status  On-going      PT SHORT TERM GOAL #4   Title  Single leg balance improved to 5 sec RT and LT leg    Status  Unable to assess        PT Long Term Goals - 03/15/18 0701      PT LONG TERM GOAL #1   Title  He will be independent wih all HEP issued    Time  8    Period  Weeks    Status  New      PT LONG TERM GOAL #2   Title  He will have max 3/10 LBP with home tasks    Time  8    Period  Weeks    Status  New      PT LONG TERM GOAL #3   Title  He will have 3/10 max pain doing normal home tasks  without brace if allowed by MD.     Time  8    Period  Weeks    Status  New      PT LONG TERM GOAL #4   Title  He will e able to walk 2-3 mile with no incr pain.     Time  8    Period  Weeks    Status  New      PT LONG TERM GOAL #5   Title  FOTO score will improve to 42% limited todemo incr functional activity    Time  8    Period  Weeks    Status  New            Plan - 03/29/18 7824    Clinical Impression Statement  Balance and general strength improving but significant weakness still in RT hip.  Tolerating aactivity without incr pain.  Encouraged him to not bend or twist at home and when in pool  as he reports he cleans pool when in the water.  PT Treatment/Interventions  Electrical Stimulation;Moist Heat;Therapeutic exercise;Patient/family education;Manual techniques;Dry needling    PT Next Visit Plan  Progress HEP . Manual and modalities if needed but will plan on no back activity specifically , add clams and reverse clams, add DF strength to HEP   Goals!    PT Home Exercise Plan  tandem balance , heel raises, hip abduction stand, wall sit , hip ext stretch (runners) and adductor stretching in standing, Abdominal set with exhaling in various positions.     Consulted and Agree with Plan of Care  Patient       Patient will benefit from skilled therapeutic intervention in order to improve the following deficits and impairments:   Decreased strength, Pain, Decreased balance  Visit Diagnosis: Chronic bilateral low back pain without sciatica  Muscle weakness (generalized)  Unsteadiness on feet     Problem List Patient Active Problem List   Diagnosis Date Noted  . Essential hypertension 02/16/2018  . Sepsis due to skin infection ([place]) 02/16/2018  . Chronic pain 02/16/2018  . Cellulitis of left lower extremity   . Lumbar radiculopathy, chronic 01/24/2018  . Lumbar stenosis with neurogenic claudication 03/23/2017  . Hypothyroidism 07/25/2015  . Primary osteoarthritis of right knee 07/23/2015  . Primary osteoarthritis of knee 07/23/2015    Darrel Hoover  PT 03/29/2018, 7:54 AM  [facility] [address] [place], [place], [address] Phone: [phone]   Fax:  [phone]  Name: Larry [name] MRN: [id number] Date of Birth: 1951/03/24

## 2018-04-04 ENCOUNTER — Ambulatory Visit: Payer: Medicare Other

## 2018-04-04 DIAGNOSIS — M6281 Muscle weakness (generalized): Secondary | ICD-10-CM

## 2018-04-04 DIAGNOSIS — M545 Low back pain: Secondary | ICD-10-CM | POA: Diagnosis not present

## 2018-04-04 DIAGNOSIS — G8929 Other chronic pain: Secondary | ICD-10-CM | POA: Diagnosis not present

## 2018-04-04 DIAGNOSIS — R2681 Unsteadiness on feet: Secondary | ICD-10-CM

## 2018-04-04 NOTE — Patient Instructions (Signed)
Issued HEP scissors, PPT, clams and reverse clams, side lye hip abduction, clams with PPT. All 1-2x/day 10-20 rpes hold 1--3 sec.

## 2018-04-04 NOTE — Therapy (Signed)
[place], [place], [address] Phone: [phone]   Fax:  [phone]  Physical Therapy Treatment  Patient Details  Name: Larry [name] MRN: [id number] Date of Birth: May 22, 1951 Referring Provider: Kristeen Miss, MD   Encounter Date: 04/04/2018  PT End of Session - 04/04/18 0724    Visit Number  6    Number of Visits  16    Date for PT Re-Evaluation  05/06/18    PT Start Time  0700    PT Stop Time  0740    PT Time Calculation (min)  40 min    Equipment Utilized During Treatment  Back brace    Activity Tolerance  Patient tolerated treatment well    Behavior During Therapy  [facility] for tasks assessed/performed       Past Medical History:  Diagnosis Date  . Arthritis    "knees; left shoulder" (04/12/2013)  . Asthma    "as a child" (04/12/2013)  . DDD (degenerative disc disease), cervical   . DDD (degenerative disc disease), lumbar   . Difficult intubation    2012   CERVICAL FUSION   . Headache(784.0)    VERAPAMIL FOR PREVENTION   . 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 History of chicken pox    as an adult  . Hypoglycemia    last episode 1 yr. ago, just gets jittery  . Hypothyroidism   . Lumbar stenosis   . OSA (obstructive sleep apnea)    "haven't wore mask in years; had OR; still snore" (04/12/2013)  . Urinary frequency     Past Surgical History:  Procedure Laterality Date  . BACK SURGERY    . CERVICAL FUSION  2002; 2012  . CYSTECTOMY    . INGUINAL HERNIA REPAIR Bilateral 1997  . KNEE ARTHROSCOPY Left ~ 1997; ~ 2005  . LUMBAR FUSION  2007; 04/11/2013  . PENILE DEBRIDEMENT  ~ 2011   X 3  FOR MRSA INFECTION    . SHOULDER ARTHROSCOPY Left ~ 2003  . TONSILLECTOMY  1990's  . TOTAL KNEE ARTHROPLASTY Right 07/23/2015  . TOTAL KNEE ARTHROPLASTY Right 07/23/2015   Procedure: TOTAL KNEE ARTHROPLASTY;   Surgeon: Garald Balding, MD;  Location: [place];  Service: Orthopedics;  Laterality: Right;  . UVULOPALATOPHARYNGOPLASTY (UPPP)/TONSILLECTOMY/SEPTOPLASTY  1990's  . VASECTOMY      There were no vitals filed for this visit.  Subjective Assessment - 04/04/18 0710    Subjective  no pain . takes meds in AM.    RT leg/calf aches at night.     Currently in Pain?  No/denies                       [facility] Adult PT Treatment/Exercise - 04/04/18 0001      Knee/Hip Exercises: Aerobic   Nustep  LE x 6  min Level 5       Knee/Hip Exercises: Seated   Sit to Sand  10 reps;without UE support      Knee/Hip Exercises: Supine   Bridges  Both;20 reps    Bridges with [employer]  Both;15 reps    Bridges with Clamshell  Both;15 reps    Other Supine Knee/Hip Exercises  clams with knee fall out to side x 8 RT and LT  then bent knee raise x 8 Rt / LT .  Knee/Hip Exercises: Sidelying   Hip ABduction  Right;Left;10 reps    Clams  RT/LT with feet lifte and reverse clams x 10 each d             PT Education - 04/04/18 0751    Education Details  HEP    Person(s) Educated  Patient    Methods  Explanation;Demonstration;Verbal cues;Handout    Comprehension  Verbalized understanding;Returned demonstration       PT Short Term Goals - 04/04/18 0726      PT SHORT TERM GOAL #1   Title  He will be independent with iniital HEp    Status  Achieved      PT SHORT TERM GOAL #2   Title  He will report greater ease with walking and lifting leg up to dry leg    Status  Achieved        PT Long Term Goals - 04/04/18 0726      PT LONG TERM GOAL #1   Title  He will be independent wih all HEP issued    Status  On-going      PT LONG TERM GOAL #2   Title  He will have max 3/10 LBP with home tasks    Baseline  varies  with highest level 8/10 lying in bed, does not do lifting , bending etcetera, on feet more    Status  On-going      PT LONG TERM GOAL #3   Title  He will have 3/10  max pain doing normal home tasks  without brace if allowed by MD.     Status  On-going      PT LONG TERM GOAL #4   Title  He will e able to walk 2-3 mile with no incr pain.     Baseline  walks 1/8 mile     Status  On-going      PT LONG TERM GOAL #5   Title  FOTO score will improve to 42% limited to demo incr functional activity    Status  Unable to assess            Plan - 04/04/18 0725    Clinical Impression Statement  No significnat changes today. Pan managed.  appears to be doing HEP . Added to HEP today.     PT Treatment/Interventions  Electrical Stimulation;Moist Heat;Therapeutic exercise;Patient/family education;Manual techniques;Dry needling    PT Next Visit Plan  Continue strength and balance.     PT Home Exercise Plan  tandem balance , heel raises, hip abduction stand, wall sit , hip ext stretch (runners) and adductor stretching in standing, Abdominal set with exhaling in various positions.     Consulted and Agree with Plan of Care  Patient       Patient will benefit from skilled therapeutic intervention in order to improve the following deficits and impairments:  Decreased strength, Pain, Decreased balance  Visit Diagnosis: Muscle weakness (generalized)  Unsteadiness on feet     Problem List Patient Active Problem List   Diagnosis Date Noted  . Essential hypertension 02/16/2018  . Sepsis due to skin infection ([place]) 02/16/2018  . Chronic pain 02/16/2018  . Cellulitis of left lower extremity   . Lumbar radiculopathy, chronic 01/24/2018  . Lumbar stenosis with neurogenic claudication 03/23/2017  . Hypothyroidism 07/25/2015  . Primary osteoarthritis of right knee 07/23/2015  . Primary osteoarthritis of knee 07/23/2015    Darrel Hoover  PT 04/04/2018, 7:51 AM  [facility]  [place], [place], [address] Phone: [phone]   Fax:  [phone]  Name: Larry [name] MRN: [id number] Date of  Birth: 11/21/1950

## 2018-04-07 ENCOUNTER — Encounter: Payer: Self-pay | Admitting: Physical Therapy

## 2018-04-07 ENCOUNTER — Ambulatory Visit: Payer: Medicare Other | Admitting: Physical Therapy

## 2018-04-07 DIAGNOSIS — M6281 Muscle weakness (generalized): Secondary | ICD-10-CM | POA: Diagnosis not present

## 2018-04-07 DIAGNOSIS — G8929 Other chronic pain: Secondary | ICD-10-CM | POA: Diagnosis not present

## 2018-04-07 DIAGNOSIS — R2681 Unsteadiness on feet: Secondary | ICD-10-CM | POA: Diagnosis not present

## 2018-04-07 DIAGNOSIS — M545 Low back pain: Secondary | ICD-10-CM | POA: Diagnosis not present

## 2018-04-07 NOTE — Therapy (Signed)
[place], [place], [address] Phone: [phone]   Fax:  [phone]  Physical Therapy Treatment  Patient Details  Name: Larry [name] MRN: [id number] Date of Birth: [birth year] Referring Provider: Kristeen Miss, MD   Encounter Date: 04/07/2018  PT End of Session - 04/07/18 0718    Visit Number  7    Number of Visits  16    Date for PT Re-Evaluation  05/06/18    PT Start Time  0715    PT Stop Time  0867    PT Time Calculation (min)  40 min       Past Medical History:  Diagnosis Date  . Arthritis    "knees; left shoulder" (04/12/2013)  . Asthma    "as a child" (04/12/2013)  . DDD (degenerative disc disease), cervical   . DDD (degenerative disc disease), lumbar   . Difficult intubation    2012   CERVICAL FUSION   . Headache(784.0)    VERAPAMIL FOR PREVENTION   . 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 History of chicken pox    as an adult  . Hypoglycemia    last episode 1 yr. ago, just gets jittery  . Hypothyroidism   . Lumbar stenosis   . OSA (obstructive sleep apnea)    "haven't wore mask in years; had OR; still snore" (04/12/2013)  . Urinary frequency     Past Surgical History:  Procedure Laterality Date  . BACK SURGERY    . CERVICAL FUSION  2002; 2012  . CYSTECTOMY    . INGUINAL HERNIA REPAIR Bilateral 1997  . KNEE ARTHROSCOPY Left ~ 1997; ~ 2005  . LUMBAR FUSION  2007; 04/11/2013  . PENILE DEBRIDEMENT  ~ 2011   X 3  FOR MRSA INFECTION    . SHOULDER ARTHROSCOPY Left ~ 2003  . TONSILLECTOMY  1990's  . TOTAL KNEE ARTHROPLASTY Right 07/23/2015  . TOTAL KNEE ARTHROPLASTY Right 07/23/2015   Procedure: TOTAL KNEE ARTHROPLASTY;  Surgeon: Garald Balding, MD;  Location: [place];  Service: Orthopedics;  Laterality: Right;  . UVULOPALATOPHARYNGOPLASTY (UPPP)/TONSILLECTOMY/SEPTOPLASTY  1990's  .  VASECTOMY      There were no vitals filed for this visit.  Subjective Assessment - 04/07/18 0717    Subjective  I am feeling better.     Currently in Pain?  No/denies                       [facility] Adult PT Treatment/Exercise - 04/07/18 0001      Lumbar Exercises: Supine   AB Set Limitations  Workred on ab set with pulling lower ribs to hips  and to add breathing to exerc si and to do in sit and stand for at home    Clam  20 reps    Clam Limitations  green    Straight Leg Raise  10 reps      Knee/Hip Exercises: Stretches   Other Knee/Hip Stretches  slant board stretch x 1 min      Knee/Hip Exercises: Aerobic   Nustep  LE x 6  min Level 5       Knee/Hip Exercises: Standing   Heel Raises  Both;20 reps    Hip Flexion  Right;Left;10 reps;2 sets yellow    Hip Abduction  10 reps;2 sets yellow    Hip Extension  10 reps;2 sets yellow    Forward Step Up  20 reps;Step Height: 6"    Other Standing Knee Exercises  tandem balance trials 15- 20 sec best cuees for abdominals       Knee/Hip Exercises: Seated   Sit to Sand  10 reps;without UE support      Knee/Hip Exercises: Supine   Bridges  Both;20 reps    Bridges with [employer]  20 reps    Straight Leg Raises  20 reps abdominal draw in               PT Short Term Goals - 04/04/18 0726      PT SHORT TERM GOAL #1   Title  He will be independent with iniital HEp    Status  Achieved      PT SHORT TERM GOAL #2   Title  He will report greater ease with walking and lifting leg up to dry leg    Status  Achieved        PT Long Term Goals - 04/04/18 0726      PT LONG TERM GOAL #1   Title  He will be independent wih all HEP issued    Status  On-going      PT LONG TERM GOAL #2   Title  He will have max 3/10 LBP with home tasks    Baseline  varies  with highest level 8/10 lying in bed, does not do lifting , bending etcetera, on feet more    Status  On-going      PT LONG TERM GOAL #3   Title  He will  have 3/10 max pain doing normal home tasks  without brace if allowed by MD.     Status  On-going      PT LONG TERM GOAL #4   Title  He will e able to walk 2-3 mile with no incr pain.     Baseline  walks 1/8 mile     Status  On-going      PT LONG TERM GOAL #5   Title  FOTO score will improve to 42% limited to demo incr functional activity    Status  Unable to assess            Plan - 04/07/18 0802    Clinical Impression Statement  Pt reports improved pain. He would like to get back to using gym equipment at home which uses free weight plates that are stored on the floor and some are 45 lbs. Discussed bending/ lifting restrictions and the need to discuss this with MD next week. He hopes to be released from back brace. Continued balance and LE strength with core recruitment. No increased pain post session.     PT Next Visit Plan  Continue strength and balance. Try treadmill? check SLS     PT Home Exercise Plan  tandem balance , heel raises, hip abduction stand, wall sit , hip ext stretch (runners) and adductor stretching in standing, Abdominal set with exhaling in various positions.     Consulted and Agree with Plan of Care  Patient       Patient will benefit from skilled therapeutic intervention in order to improve the following deficits and impairments:  Decreased strength, Pain, Decreased balance  Visit Diagnosis: Muscle weakness (generalized)  Unsteadiness on feet  Chronic bilateral low back pain without sciatica     Problem List Patient Active Problem List   Diagnosis Date Noted  . Essential hypertension  02/16/2018  . Sepsis due to skin infection ([place]) 02/16/2018  . Chronic pain 02/16/2018  . Cellulitis of left lower extremity   . Lumbar radiculopathy, chronic 01/24/2018  . Lumbar stenosis with neurogenic claudication 03/23/2017  . Hypothyroidism 07/25/2015  . Primary osteoarthritis of right knee 07/23/2015  . Primary osteoarthritis of knee 07/23/2015    Dorene Ar, [place] 04/07/2018, 8:11 AM  [place], [place], [address] Phone: [phone]   Fax:  [phone]  Name: Larry [name] MRN: [id number] Date of Birth: 07-10-1951

## 2018-04-11 ENCOUNTER — Ambulatory Visit: Payer: Medicare Other | Admitting: Physical Therapy

## 2018-04-11 ENCOUNTER — Encounter: Payer: Self-pay | Admitting: Physical Therapy

## 2018-04-11 DIAGNOSIS — M6281 Muscle weakness (generalized): Secondary | ICD-10-CM

## 2018-04-11 DIAGNOSIS — R2681 Unsteadiness on feet: Secondary | ICD-10-CM

## 2018-04-11 DIAGNOSIS — M545 Low back pain: Secondary | ICD-10-CM

## 2018-04-11 DIAGNOSIS — G8929 Other chronic pain: Secondary | ICD-10-CM | POA: Diagnosis not present

## 2018-04-11 NOTE — Therapy (Signed)
[place], [place], [address] Phone: [phone]   Fax:  [phone]  Physical Therapy Treatment  Patient Details  Name: Larry [name] MRN: [id number] Date of Birth: April 10, 1951 Referring Provider: Kristeen Miss, MD   Encounter Date: 04/11/2018  PT End of Session - 04/11/18 0736    Visit Number  8    Number of Visits  16    Date for PT Re-Evaluation  05/06/18    PT Start Time  0730    PT Stop Time  0811    PT Time Calculation (min)  41 min       Past Medical History:  Diagnosis Date  . Arthritis    "knees; left shoulder" (04/12/2013)  . Asthma    "as a child" (04/12/2013)  . DDD (degenerative disc disease), cervical   . DDD (degenerative disc disease), lumbar   . Difficult intubation    2012   CERVICAL FUSION   . Headache(784.0)    VERAPAMIL FOR PREVENTION   . 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 History of chicken pox    as an adult  . Hypoglycemia    last episode 1 yr. ago, just gets jittery  . Hypothyroidism   . Lumbar stenosis   . OSA (obstructive sleep apnea)    "haven't wore mask in years; had OR; still snore" (04/12/2013)  . Urinary frequency     Past Surgical History:  Procedure Laterality Date  . BACK SURGERY    . CERVICAL FUSION  2002; 2012  . CYSTECTOMY    . INGUINAL HERNIA REPAIR Bilateral 1997  . KNEE ARTHROSCOPY Left ~ 1997; ~ 2005  . LUMBAR FUSION  2007; 04/11/2013  . PENILE DEBRIDEMENT  ~ 2011   X 3  FOR MRSA INFECTION    . SHOULDER ARTHROSCOPY Left ~ 2003  . TONSILLECTOMY  1990's  . TOTAL KNEE ARTHROPLASTY Right 07/23/2015  . TOTAL KNEE ARTHROPLASTY Right 07/23/2015   Procedure: TOTAL KNEE ARTHROPLASTY;  Surgeon: Garald Balding, MD;  Location: [facility];  Service: Orthopedics;  Laterality: Right;  . UVULOPALATOPHARYNGOPLASTY (UPPP)/TONSILLECTOMY/SEPTOPLASTY  1990's  .  VASECTOMY      There were no vitals filed for this visit.  Subjective Assessment - 04/11/18 0734    Subjective  Right lower leg and foot were bothering me last night. No pain now. Just a little soreness in the right calf.     Currently in Pain?  No/denies                       [facility] Adult PT Treatment/Exercise - 04/11/18 0001      Lumbar Exercises: Supine   AB Set Limitations  Workred on ab set with pulling lower ribs to hips  and to add breathing to exerc si and to do in sit and stand for at home    Clam  20 reps with abdominal draw in    Clam Limitations  green      Knee/Hip Exercises: Stretches   Other Knee/Hip Stretches  slant board stretch x 1 min      Knee/Hip Exercises: Aerobic   Nustep  LE x 6  min Level 5       Knee/Hip Exercises: Standing   Forward Step Up  20 reps;Step Height: 6"    SLS  Left 38 sec, 8 sec  SLS with Vectors  RT/LT x 8-10 on therafoam  1 finger touch    Other Standing Knee Exercises  tandem balance trials 60 sec best cuees for abdominals       Knee/Hip Exercises: Seated   Other Seated Knee/Hip Exercises  Seated ankle ER isometricx 20     Other Seated Knee/Hip Exercises  DF with 3# x 20     Sit to Sand  10 reps;without UE support      Knee/Hip Exercises: Supine   Bridges  Both;20 reps    Straight Leg Raises  20 reps abdominal draw in    Other Supine Knee/Hip Exercises  Right hip IR with green band resistance held by PTA x 20       Knee/Hip Exercises: Sidelying   Hip ABduction  Right;Left;10 reps;2 sets               PT Short Term Goals - 04/11/18 0752      PT SHORT TERM GOAL #1   Title  He will be independent with iniital HEp    Time  4    Period  Weeks    Status  Achieved      PT SHORT TERM GOAL #2   Title  He will report greater ease with walking and lifting leg up to dry leg    Time  4    Period  Weeks    Status  Achieved      PT SHORT TERM GOAL #4   Title  Single leg balance improved to 5 sec RT and LT  leg    Time  4    Period  Weeks    Status  Achieved        PT Long Term Goals - 04/04/18 9417      PT LONG TERM GOAL #1   Title  He will be independent wih all HEP issued    Status  On-going      PT LONG TERM GOAL #2   Title  He will have max 3/10 LBP with home tasks    Baseline  varies  with highest level 8/10 lying in bed, does not do lifting , bending etcetera, on feet more    Status  On-going      PT LONG TERM GOAL #3   Title  He will have 3/10 max pain doing normal home tasks  without brace if allowed by MD.     Status  On-going      PT LONG TERM GOAL #4   Title  He will e able to walk 2-3 mile with no incr pain.     Baseline  walks 1/8 mile     Status  On-going      PT LONG TERM GOAL #5   Title  FOTO score will improve to 42% limited to demo incr functional activity    Status  Unable to assess            Plan - 04/11/18 0748    Clinical Impression Statement  Pt reports he tries to walk 2 miles total a day and uses his phone tracker. He reports no back pain lately and only right LE pain. SLS and tandem stance  improved bilateral STG# 4 met.     PT Next Visit Plan  Continue strength and balance. Try treadmill? FOTO     PT Home Exercise Plan  tandem balance , heel raises, hip abduction stand, wall sit , hip ext stretch (runners) and adductor  stretching in standing, Abdominal set with exhaling in various positions.     Consulted and Agree with Plan of Care  Patient       Patient will benefit from skilled therapeutic intervention in order to improve the following deficits and impairments:  Decreased strength, Pain, Decreased balance  Visit Diagnosis: Muscle weakness (generalized)  Unsteadiness on feet  Chronic bilateral low back pain without sciatica     Problem List Patient Active Problem List   Diagnosis Date Noted  . Essential hypertension 02/16/2018  . Sepsis due to skin infection ([place]) 02/16/2018  . Chronic pain 02/16/2018  . Cellulitis of left  lower extremity   . Lumbar radiculopathy, chronic 01/24/2018  . Lumbar stenosis with neurogenic claudication 03/23/2017  . Hypothyroidism 07/25/2015  . Primary osteoarthritis of right knee 07/23/2015  . Primary osteoarthritis of knee 07/23/2015    Dorene Ar, [place] 04/11/2018, 8:14 AM  [facility] [address] [place], [place], [address] Phone: [phone]   Fax:  [phone]  Name: YAHEL FUSTON MRN: [id number] Date of Birth: [birth year]

## 2018-04-13 ENCOUNTER — Ambulatory Visit: Payer: Medicare Other | Admitting: Physical Therapy

## 2018-04-13 ENCOUNTER — Encounter: Payer: Self-pay | Admitting: Physical Therapy

## 2018-04-13 DIAGNOSIS — G8929 Other chronic pain: Secondary | ICD-10-CM | POA: Diagnosis not present

## 2018-04-13 DIAGNOSIS — M545 Low back pain, unspecified: Secondary | ICD-10-CM

## 2018-04-13 DIAGNOSIS — R2681 Unsteadiness on feet: Secondary | ICD-10-CM | POA: Diagnosis not present

## 2018-04-13 DIAGNOSIS — M6281 Muscle weakness (generalized): Secondary | ICD-10-CM | POA: Diagnosis not present

## 2018-04-13 NOTE — Therapy (Signed)
[place], [place], [address] Phone: [phone]   Fax:  [phone]  Physical Therapy Treatment  Patient Details  Name: Larry [name] MRN: [id number] Date of Birth: 03-31-1951 Referring Provider: Kristeen Miss, MD   Encounter Date: 04/13/2018  PT End of Session - 04/13/18 0816    Visit Number  9    Number of Visits  16    Date for PT Re-Evaluation  05/06/18    PT Start Time  0725    PT Stop Time  0804    PT Time Calculation (min)  39 min       Past Medical History:  Diagnosis Date  . Arthritis    "knees; left shoulder" (04/12/2013)  . Asthma    "as a child" (04/12/2013)  . DDD (degenerative disc disease), cervical   . DDD (degenerative disc disease), lumbar   . Difficult intubation    2012   CERVICAL FUSION   . Headache(784.0)    VERAPAMIL FOR PREVENTION   . 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 History of chicken pox    as an adult  . Hypoglycemia    last episode 1 yr. ago, just gets jittery  . Hypothyroidism   . Lumbar stenosis   . OSA (obstructive sleep apnea)    "haven't wore mask in years; had OR; still snore" (04/12/2013)  . Urinary frequency     Past Surgical History:  Procedure Laterality Date  . BACK SURGERY    . CERVICAL FUSION  2002; 2012  . CYSTECTOMY    . INGUINAL HERNIA REPAIR Bilateral 1997  . KNEE ARTHROSCOPY Left ~ 1997; ~ 2005  . LUMBAR FUSION  2007; 04/11/2013  . PENILE DEBRIDEMENT  ~ 2011   X 3  FOR MRSA INFECTION    . SHOULDER ARTHROSCOPY Left ~ 2003  . TONSILLECTOMY  1990's  . TOTAL KNEE ARTHROPLASTY Right 07/23/2015  . TOTAL KNEE ARTHROPLASTY Right 07/23/2015   Procedure: TOTAL KNEE ARTHROPLASTY;  Surgeon: Garald Balding, MD;  Location: [facility];  Service: Orthopedics;  Laterality: Right;  . UVULOPALATOPHARYNGOPLASTY (UPPP)/TONSILLECTOMY/SEPTOPLASTY  1990's  .  VASECTOMY      There were no vitals filed for this visit.  Subjective Assessment - 04/13/18 0816    Subjective  No pain. Doing well.     Currently in Pain?  No/denies                       [facility] Adult PT Treatment/Exercise - 04/13/18 0001      Lumbar Exercises: Supine   AB Set Limitations  Workred on ab set with pulling lower ribs to hips  and to add breathing to exerc si and to do in sit and stand for at home    Bent Knee Raise  20 reps ab brace    Bridge  10 reps    Straight Leg Raise  10 reps      Knee/Hip Exercises: Stretches   Other Knee/Hip Stretches  slant board stretch x 1 min      Knee/Hip Exercises: Aerobic   Nustep  LE x 6  min Level 5       Knee/Hip Exercises: Standing   Heel Raises  Both;20 reps and toe raises    Knee Flexion  20 reps 3#    Forward Step Up  20 reps;Step Height:  6";Right;Left    Rocker Board  2 minutes    SLS  on foam pad    Other Standing Knee Exercises  3 way hip 3# x 20 each bialteral    Other Standing Knee Exercises  narrow stance on foam pad, tandem gait in bars               PT Short Term Goals - 04/11/18 0752      PT SHORT TERM GOAL #1   Title  He will be independent with iniital HEp    Time  4    Period  Weeks    Status  Achieved      PT SHORT TERM GOAL #2   Title  He will report greater ease with walking and lifting leg up to dry leg    Time  4    Period  Weeks    Status  Achieved      PT SHORT TERM GOAL #4   Title  Single leg balance improved to 5 sec RT and LT leg    Time  4    Period  Weeks    Status  Achieved        PT Long Term Goals - 04/04/18 9563      PT LONG TERM GOAL #1   Title  He will be independent wih all HEP issued    Status  On-going      PT LONG TERM GOAL #2   Title  He will have max 3/10 LBP with home tasks    Baseline  varies  with highest level 8/10 lying in bed, does not do lifting , bending etcetera, on feet more    Status  On-going      PT LONG TERM GOAL #3    Title  He will have 3/10 max pain doing normal home tasks  without brace if allowed by MD.     Status  On-going      PT LONG TERM GOAL #4   Title  He will e able to walk 2-3 mile with no incr pain.     Baseline  walks 1/8 mile     Status  On-going      PT LONG TERM GOAL #5   Title  FOTO score will improve to 42% limited to demo incr functional activity    Status  Unable to assess            Plan - 04/13/18 0819    Clinical Impression Statement  Added 3# to standing 3 way hip exercises. Added foam pad and rocker board for balance exercises. Improved tandem gait without UE use.     PT Next Visit Plan  Continue strength and balance. Try treadmill? FOTO , gait     PT Home Exercise Plan  tandem balance , heel raises, hip abduction stand, wall sit , hip ext stretch (runners) and adductor stretching in standing, Abdominal set with exhaling in various positions.     Consulted and Agree with Plan of Care  Patient       Patient will benefit from skilled therapeutic intervention in order to improve the following deficits and impairments:  Decreased strength, Pain, Decreased balance  Visit Diagnosis: Muscle weakness (generalized)  Unsteadiness on feet  Chronic bilateral low back pain without sciatica     Problem List Patient Active Problem List   Diagnosis Date Noted  . Essential hypertension 02/16/2018  . Sepsis due to skin infection ([place]) 02/16/2018  . Chronic pain  02/16/2018  . Cellulitis of left lower extremity   . Lumbar radiculopathy, chronic 01/24/2018  . Lumbar stenosis with neurogenic claudication 03/23/2017  . Hypothyroidism 07/25/2015  . Primary osteoarthritis of right knee 07/23/2015  . Primary osteoarthritis of knee 07/23/2015    Dorene Ar, [place] 04/13/2018, 8:27 AM  [facility] [address] [place], [place], [address] Phone: [phone]   Fax:  [phone]  Name: Larry [name] MRN:  [id number] Date of Birth: 1951-05-03

## 2018-04-14 DIAGNOSIS — M5416 Radiculopathy, lumbar region: Secondary | ICD-10-CM | POA: Diagnosis not present

## 2018-04-18 ENCOUNTER — Ambulatory Visit: Payer: Medicare Other

## 2018-04-18 DIAGNOSIS — M6281 Muscle weakness (generalized): Secondary | ICD-10-CM | POA: Diagnosis not present

## 2018-04-18 DIAGNOSIS — M545 Low back pain: Secondary | ICD-10-CM | POA: Diagnosis not present

## 2018-04-18 DIAGNOSIS — M71572 Other bursitis, not elsewhere classified, left ankle and foot: Secondary | ICD-10-CM | POA: Diagnosis not present

## 2018-04-18 DIAGNOSIS — G8929 Other chronic pain: Secondary | ICD-10-CM | POA: Diagnosis not present

## 2018-04-18 DIAGNOSIS — M792 Neuralgia and neuritis, unspecified: Secondary | ICD-10-CM | POA: Diagnosis not present

## 2018-04-18 DIAGNOSIS — M7732 Calcaneal spur, left foot: Secondary | ICD-10-CM | POA: Diagnosis not present

## 2018-04-18 DIAGNOSIS — M19071 Primary osteoarthritis, right ankle and foot: Secondary | ICD-10-CM | POA: Diagnosis not present

## 2018-04-18 DIAGNOSIS — R2681 Unsteadiness on feet: Secondary | ICD-10-CM

## 2018-04-18 DIAGNOSIS — M722 Plantar fascial fibromatosis: Secondary | ICD-10-CM | POA: Diagnosis not present

## 2018-04-18 NOTE — Therapy (Signed)
Larry [name], [place], [address] Phone: [phone]   Fax:  [phone]  Physical Therapy Treatment  Patient Details  Name: Larry [name] MRN: [id number] Date of Birth: 1951-05-25 Referring Provider: Kristeen Miss, MD  Progress Note Reporting Period 03/15/18 to 04/18/18  See note below for Objective Data and Assessment of Progress/Goals.      Encounter Date: 04/18/2018  PT End of Session - 04/18/18 0710    Visit Number  10    Number of Visits  16    Date for PT Re-Evaluation  05/06/18    PT Start Time  0705    PT Stop Time  0748    PT Time Calculation (min)  43 min    Activity Tolerance  Patient tolerated treatment well    Behavior During Therapy  [facility] for tasks assessed/performed       Past Medical History:  Diagnosis Date  . Arthritis    "knees; left shoulder" (04/12/2013)  . Asthma    "as a child" (04/12/2013)  . DDD (degenerative disc disease), cervical   . DDD (degenerative disc disease), lumbar   . Difficult intubation    2012   CERVICAL FUSION   . Headache(784.0)    VERAPAMIL FOR PREVENTION   . 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 History of chicken pox    as an adult  . Hypoglycemia    last episode 1 yr. ago, just gets jittery  . Hypothyroidism   . Lumbar stenosis   . OSA (obstructive sleep apnea)    "haven't wore mask in years; had OR; still snore" (04/12/2013)  . Urinary frequency     Past Surgical History:  Procedure Laterality Date  . BACK SURGERY    . CERVICAL FUSION  2002; 2012  . CYSTECTOMY    . INGUINAL HERNIA REPAIR Bilateral 1997  . KNEE ARTHROSCOPY Left ~ 1997; ~ 2005  . LUMBAR FUSION  2007; 04/11/2013  . PENILE DEBRIDEMENT  ~ 2011   X 3  FOR MRSA INFECTION    . SHOULDER ARTHROSCOPY Left ~ 2003  . TONSILLECTOMY  1990's  . TOTAL KNEE ARTHROPLASTY Right 07/23/2015  .  TOTAL KNEE ARTHROPLASTY Right 07/23/2015   Procedure: TOTAL KNEE ARTHROPLASTY;  Surgeon: Larry Balding, MD;  Location: [place];  Service: Orthopedics;  Laterality: Right;  . UVULOPALATOPHARYNGOPLASTY (UPPP)/TONSILLECTOMY/SEPTOPLASTY  1990's  . VASECTOMY      There were no vitals filed for this visit.  Subjective Assessment - 04/18/18 0709    Subjective  No pain. MD stated no restrictions and note brought to clinic by Larry [name]. ot wearing brace    Currently in Pain?  No/denies         [facility] PT Assessment - 04/18/18 0001      Observation/Other Assessments   Focus on Therapeutic Outcomes (FOTO)   37% limited, 17 point decr                   [facility] Adult PT Treatment/Exercise - 04/18/18 0001      Neuro Re-ed    Neuro Re-ed Details   tadem walking on red line and on 2x/6 forward and back . tandem and feet perpendicular to board  balance on 4x4 with arm movements . Single leg balance on 4x4 RT and LT  no UE support      Lumbar Exercises:  Supine   Ab Set  10 reps    Bent Knee Raise  20 reps abdominal bracing    Straight Leg Raise  20 reps;Other (comment) 2x10 RT/LT cued to add abdominals      Knee/Hip Exercises: Stretches   Passive Hamstring Stretch  Right;Left;60 seconds    Piriformis Stretch  Right;Left;2 reps;30 seconds    Other Knee/Hip Stretches  slant board stretch x 1 min      Knee/Hip Exercises: Aerobic   Nustep  LE x 6  min Level 5       Knee/Hip Exercises: Standing   Heel Raises  Both;20 reps;Limitations    Heel Raises Limitations  and toe raises x 20    Knee Flexion  20 reps;Limitations    Knee Flexion Limitations  3 pounds    Hip Flexion  Limitations    Hip Flexion Limitations  3#    Hip Abduction  Right;Left;15 reps;Limitations    Abduction Limitations  3#     Hip Extension  Left;Right;15 reps;Limitations    Extension Limitations  3#    Lateral Step Up  Hand Hold: 1;Step Height: 6";Right;Limitations    Lateral Step Up Limitations  RT only due  to LT knee pain    Forward Step Up  --               PT Short Term Goals - 04/11/18 1610      PT SHORT TERM GOAL #1   Title  He will be independent with iniital HEp    Time  4    Period  Weeks    Status  Achieved      PT SHORT TERM GOAL #2   Title  He will report greater ease with walking and lifting leg up to dry leg    Time  4    Period  Weeks    Status  Achieved      PT SHORT TERM GOAL #4   Title  Single leg balance improved to 5 sec RT and LT leg    Time  4    Period  Weeks    Status  Achieved        PT Long Term Goals - 04/18/18 9604      PT LONG TERM GOAL #1   Title  He will be independent wih all HEP issued    Status  On-going      PT LONG TERM GOAL #2   Title  He will have max 3/10 LBP with home tasks    Baseline  not lifting at this point    Status  Partially Met      PT LONG TERM GOAL #3   Title  He will have 3/10 max pain doing normal home tasks  without brace if allowed by MD.     Status  Achieved      PT LONG TERM GOAL #4   Title  He will e able to walk 2-3 mile with no incr pain.     Status  On-going      PT LONG TERM GOAL #5   Title  FOTO score will improve to 42% limited to demo incr functional activity    Status  Achieved            Plan - 04/18/18 0719    Clinical Impression Statement  No back pain with all exercises.   [place] good but decr with higher level balance.  Knee pain limits activity. Continue to end of  plan then probable discharge.    PT Treatment/Interventions  Electrical Stimulation;Moist Heat;Therapeutic exercise;Patient/family education;Manual techniques;Dry needling    PT Next Visit Plan  Continue strength and balance.   gait , knee pain may be limiting factor.  Ask about lifting needs    PT Home Exercise Plan  tandem balance , heel raises, hip abduction stand, wall sit , hip ext stretch (runners) and adductor stretching in standing, Abdominal set with exhaling in various positions.     Consulted and Agree with  Plan of Care  Patient       Patient will benefit from skilled therapeutic intervention in order to improve the following deficits and impairments:  Decreased strength, Pain, Decreased balance  Visit Diagnosis: Muscle weakness (generalized)  Unsteadiness on feet  Chronic bilateral low back pain without sciatica     Problem List Patient Active Problem List   Diagnosis Date Noted  . Essential hypertension 02/16/2018  . Sepsis due to skin infection ([place]) 02/16/2018  . Chronic pain 02/16/2018  . Cellulitis of left lower extremity   . Lumbar radiculopathy, chronic 01/24/2018  . Lumbar stenosis with neurogenic claudication 03/23/2017  . Hypothyroidism 07/25/2015  . Primary osteoarthritis of right knee 07/23/2015  . Primary osteoarthritis of knee 07/23/2015    Darrel Hoover  PT 04/18/2018, 8:02 AM  [facility] [address] [place], [place], [address] Phone: [phone]   Fax:  [phone]  Name: Larry [name] MRN: [id number] Date of Birth: 1950/11/24

## 2018-04-20 ENCOUNTER — Ambulatory Visit: Payer: Medicare Other

## 2018-04-20 DIAGNOSIS — R2681 Unsteadiness on feet: Secondary | ICD-10-CM | POA: Diagnosis not present

## 2018-04-20 DIAGNOSIS — M6281 Muscle weakness (generalized): Secondary | ICD-10-CM | POA: Diagnosis not present

## 2018-04-20 DIAGNOSIS — M545 Low back pain: Secondary | ICD-10-CM

## 2018-04-20 DIAGNOSIS — G8929 Other chronic pain: Secondary | ICD-10-CM | POA: Diagnosis not present

## 2018-04-20 NOTE — Therapy (Signed)
[place], [place], [address] Phone: [phone]   Fax:  [phone]  Physical Therapy Treatment  Patient Details  Name: Larry [name] MRN: [id number] Date of Birth: October 06, 1950 Referring Provider: Kristeen Miss, MD   Encounter Date: 04/20/2018  PT End of Session - 04/20/18 0704    Visit Number  11    Number of Visits  19    Date for PT Re-Evaluation  05/20/18    Authorization Type  MCR     Authorization Time Period  Kx at visit 15  Progress visit 20    PT Start Time  0700    PT Stop Time  0750    PT Time Calculation (min)  50 min    Activity Tolerance  Patient tolerated treatment well    Behavior During Therapy  [facility] for tasks assessed/performed       Past Medical History:  Diagnosis Date  . Arthritis    "knees; left shoulder" (04/12/2013)  . Asthma    "as a child" (04/12/2013)  . DDD (degenerative disc disease), cervical   . DDD (degenerative disc disease), lumbar   . Difficult intubation    2012   CERVICAL FUSION   . Headache(784.0)    VERAPAMIL FOR PREVENTION   . 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 History of chicken pox    as an adult  . Hypoglycemia    last episode 1 yr. ago, just gets jittery  . Hypothyroidism   . Lumbar stenosis   . OSA (obstructive sleep apnea)    "haven't wore mask in years; had OR; still snore" (04/12/2013)  . Urinary frequency     Past Surgical History:  Procedure Laterality Date  . BACK SURGERY    . CERVICAL FUSION  2002; 2012  . CYSTECTOMY    . INGUINAL HERNIA REPAIR Bilateral 1997  . KNEE ARTHROSCOPY Left ~ 1997; ~ 2005  . LUMBAR FUSION  2007; 04/11/2013  . PENILE DEBRIDEMENT  ~ 2011   X 3  FOR MRSA INFECTION    . SHOULDER ARTHROSCOPY Left ~ 2003  . TONSILLECTOMY  1990's  . TOTAL KNEE ARTHROPLASTY Right 07/23/2015  . TOTAL KNEE ARTHROPLASTY Right 07/23/2015    Procedure: TOTAL KNEE ARTHROPLASTY;  Surgeon: Garald Balding, MD;  Location: [facility];  Service: Orthopedics;  Laterality: Right;  . UVULOPALATOPHARYNGOPLASTY (UPPP)/TONSILLECTOMY/SEPTOPLASTY  1990's  . VASECTOMY      There were no vitals filed for this visit.  Subjective Assessment - 04/20/18 0703    Subjective  Shot in heel helped.  DPM suggested good supportive shoes and will go to neurologist for feet symptoms.     No back pain    Currently in Pain?  No/denies                       [facility] Adult PT Treatment/Exercise - 04/20/18 0001      Therapeutic Activites    Therapeutic Activities  Lifting    Lifting  lift box waist to  chest  8 # x 5  then 13# x 5 , then  18# x 5, then 23 pounds  x5,  then 28# x 5  ( bgan to ahve some trap elevation  ,   No pain .  Then 28# waist to knee with cues to squat 2x5.  Then carry one hand  10#  Rt/LT   then 16 # both 50 feet.   Carry with Lt hand less steady probably due to weight on LT OA knee       Knee/Hip Exercises: Stretches   Other Knee/Hip Stretches  slant board stretch 2 x 1 min      Knee/Hip Exercises: Aerobic   Nustep  LE/UE  x 6  min Level 5       Knee/Hip Exercises: Standing   Heel Raises  Both;20 reps;Limitations    Heel Raises Limitations  and toe raises x 20    Knee Flexion  20 reps;Limitations    Knee Flexion Limitations  5 #    Hip Flexion  Right;Left;15 reps    Hip Flexion Limitations  5#    Forward Lunges  Right;Left;15 reps    Side Lunges  Right;Left;15 reps    Hip Abduction  Right;Left;Limitations;20 reps    Abduction Limitations  5#    Hip Extension  Left;Right;Limitations;20 reps    Extension Limitations  5#      Ankle Exercises: Seated   Towel Crunch  -- 25 reps RT/LT              PT Education - 04/20/18 0805    Education Details  towel exer, need to support  feet with good foot wear and  keeping shoes tied.     Person(s) Educated  Patient    Methods  Explanation;Verbal cues;Tactile  cues;Handout;Demonstration    Comprehension  Returned demonstration;Verbalized understanding       PT Short Term Goals - 04/11/18 0752      PT SHORT TERM GOAL #1   Title  He will be independent with iniital HEp    Time  4    Period  Weeks    Status  Achieved      PT SHORT TERM GOAL #2   Title  He will report greater ease with walking and lifting leg up to dry leg    Time  4    Period  Weeks    Status  Achieved      PT SHORT TERM GOAL #4   Title  Single leg balance improved to 5 sec RT and LT leg    Time  4    Period  Weeks    Status  Achieved        PT Long Term Goals - 04/18/18 0735      PT LONG TERM GOAL #1   Title  He will be independent wih all HEP issued    Status  On-going      PT LONG TERM GOAL #2   Title  He will have max 3/10 LBP with home tasks    Baseline  not lifting at this point    Status  Partially Met      PT LONG TERM GOAL #3   Title  He will have 3/10 max pain doing normal home tasks  without brace if allowed by MD.     Status  Achieved      PT LONG TERM GOAL #4   Title  He will e able to walk 2-3 mile with no incr pain.     Status  On-going      PT LONG TERM GOAL #5   Title  FOTO score will improve to 42% limited to demo incr functional activity    Status  Achieved            Plan - 04/20/18 0705      Clinical Impression Statement  lifting to 25 pounbds withoyut pain. Discussed not bending or twisting with lifting and not to aggravate knee or foot .  He reported lifted dry wall mud to truck recently with no pain.  Asked him to fo to shoe store and have them try  a variety of shoes tos see best support to decr arch stress and foot alignment.   Latera RT foot weakness cxreates a risk for inversion sprain so started exer for home for strength. This may be related to nerve damage .    PT Frequency  4x / week    PT Treatment/Interventions  Electrical Stimulation;Moist Heat;Therapeutic exercise;Patient/family education;Manual techniques;Dry  needling    PT Next Visit Plan  Continue strength and balance.   gait , knee pain may be limiting factor.  Ask about lifting needs , practice lifting again. review ankle  exercises    PT Home Exercise Plan  tandem balance , heel raises, hip abduction stand, wall sit , hip ext stretch (runners) and adductor stretching in standing, Abdominal set with exhaling in various positions.     Consulted and Agree with Plan of Care  Patient       Patient will benefit from skilled therapeutic intervention in order to improve the following deficits and impairments:  Decreased strength, Pain, Decreased balance  Visit Diagnosis: Muscle weakness (generalized)  Unsteadiness on feet  Chronic bilateral low back pain without sciatica     Problem List Patient Active Problem List   Diagnosis Date Noted  . Essential hypertension 02/16/2018  . Sepsis due to skin infection ([place]) 02/16/2018  . Chronic pain 02/16/2018  . Cellulitis of left lower extremity   . Lumbar radiculopathy, chronic 01/24/2018  . Lumbar stenosis with neurogenic claudication 03/23/2017  . Hypothyroidism 07/25/2015  . Primary osteoarthritis of right knee 07/23/2015  . Primary osteoarthritis of knee 07/23/2015    Darrel Hoover  PT 04/20/2018, 8:14 AM  [facility] [address] [place], [place], [address] Phone: [phone]   Fax:  [phone]  Name: VIRGIL LIGHTNER MRN: [id number] Date of Birth: 1950-10-10

## 2018-04-20 NOTE — Patient Instructions (Signed)
Towel exer bilateral feet x 25 reps each 1-2x/day

## 2018-04-25 ENCOUNTER — Ambulatory Visit: Payer: Medicare Other | Admitting: Physical Therapy

## 2018-04-25 ENCOUNTER — Encounter: Payer: Self-pay | Admitting: Physical Therapy

## 2018-04-25 DIAGNOSIS — R2681 Unsteadiness on feet: Secondary | ICD-10-CM

## 2018-04-25 DIAGNOSIS — G8929 Other chronic pain: Secondary | ICD-10-CM

## 2018-04-25 DIAGNOSIS — M722 Plantar fascial fibromatosis: Secondary | ICD-10-CM | POA: Diagnosis not present

## 2018-04-25 DIAGNOSIS — M6281 Muscle weakness (generalized): Secondary | ICD-10-CM | POA: Diagnosis not present

## 2018-04-25 DIAGNOSIS — M545 Low back pain: Secondary | ICD-10-CM | POA: Diagnosis not present

## 2018-04-25 NOTE — Therapy (Signed)
[place], [place], [address] Phone: [phone]   Fax:  [phone]  Physical Therapy Treatment  Patient Details  Name: Larry [name] MRN: [id number] Date of Birth: Feb 08, 1951 Referring Provider: Kristeen Miss, MD   Encounter Date: 04/25/2018  PT End of Session - 04/25/18 0912    Visit Number  12    Number of Visits  19    Date for PT Re-Evaluation  05/20/18    Authorization Type  MCR     Authorization Time Period  Kx at visit 15  Progress visit 20    PT Start Time  0730    PT Stop Time  0810    PT Time Calculation (min)  40 min       Past Medical History:  Diagnosis Date  . Arthritis    "knees; left shoulder" (04/12/2013)  . Asthma    "as a child" (04/12/2013)  . DDD (degenerative disc disease), cervical   . DDD (degenerative disc disease), lumbar   . Difficult intubation    2012   CERVICAL FUSION   . Headache(784.0)    VERAPAMIL FOR PREVENTION   . 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 History of chicken pox    as an adult  . Hypoglycemia    last episode 1 yr. ago, just gets jittery  . Hypothyroidism   . Lumbar stenosis   . OSA (obstructive sleep apnea)    "haven't wore mask in years; had OR; still snore" (04/12/2013)  . Urinary frequency     Past Surgical History:  Procedure Laterality Date  . BACK SURGERY    . CERVICAL FUSION  2002; 2012  . CYSTECTOMY    . INGUINAL HERNIA REPAIR Bilateral 1997  . KNEE ARTHROSCOPY Left ~ 1997; ~ 2005  . LUMBAR FUSION  2007; 04/11/2013  . PENILE DEBRIDEMENT  ~ 2011   X 3  FOR MRSA INFECTION    . SHOULDER ARTHROSCOPY Left ~ 2003  . TONSILLECTOMY  1990's  . TOTAL KNEE ARTHROPLASTY Right 07/23/2015  . TOTAL KNEE ARTHROPLASTY Right 07/23/2015   Procedure: TOTAL KNEE ARTHROPLASTY;  Surgeon: Garald Balding, MD;  Location: [facility];  Service: Orthopedics;   Laterality: Right;  . UVULOPALATOPHARYNGOPLASTY (UPPP)/TONSILLECTOMY/SEPTOPLASTY  1990's  . VASECTOMY      There were no vitals filed for this visit.  Subjective Assessment - 04/25/18 0734    Subjective  Have follow ups with neurologist and foot doctor this week.     Currently in Pain?  No/denies    Pain Location  Back    Pain Orientation  Left;Right;Lower    Pain Descriptors / Indicators  Aching                       [facility] Adult PT Treatment/Exercise - 04/25/18 0001      Lumbar Exercises: Supine   Straight Leg Raise  20 reps;Other (comment) 2x10 RT/LT cued to add abdominals      Knee/Hip Exercises: Stretches   Other Knee/Hip Stretches  slant board stretch 2 x 1 min      Knee/Hip Exercises: Aerobic   Nustep  LE x 5  min Level 5       Knee/Hip Exercises: Standing   Heel Raises  Both;20 reps;Limitations    Heel Raises Limitations  and toe raises x 20  Knee Flexion  20 reps;Limitations    Knee Flexion Limitations  5 #    Hip Flexion  Right;Left;20 reps    Hip Flexion Limitations  5#    Hip Abduction  Right;Left;Limitations;20 reps    Abduction Limitations  5#    Hip Extension  Left;Right;Limitations;20 reps    Extension Limitations  5#    Forward Step Up  20 reps;Step Height: 6";Right;Left    SLS  5 sec on right, 23 sec or left      Knee/Hip Exercises: Sidelying   Hip ABduction  20 reps;Right;Left    Other Sidelying Knee/Hip Exercises  sidelying ankle eversion AROM               PT Short Term Goals - 04/11/18 0752      PT SHORT TERM GOAL #1   Title  He will be independent with iniital HEp    Time  4    Period  Weeks    Status  Achieved      PT SHORT TERM GOAL #2   Title  He will report greater ease with walking and lifting leg up to dry leg    Time  4    Period  Weeks    Status  Achieved      PT SHORT TERM GOAL #4   Title  Single leg balance improved to 5 sec RT and LT leg    Time  4    Period  Weeks    Status  Achieved         PT Long Term Goals - 04/18/18 0735      PT LONG TERM GOAL #1   Title  He will be independent wih all HEP issued    Status  On-going      PT LONG TERM GOAL #2   Title  He will have max 3/10 LBP with home tasks    Baseline  not lifting at this point    Status  Partially Met      PT LONG TERM GOAL #3   Title  He will have 3/10 max pain doing normal home tasks  without brace if allowed by MD.     Status  Achieved      PT LONG TERM GOAL #4   Title  He will e able to walk 2-3 mile with no incr pain.     Status  On-going      PT LONG TERM GOAL #5   Title  FOTO score will improve to 42% limited to demo incr functional activity    Status  Achieved            Plan - 04/25/18 0913    Clinical Impression Statement  Pain at lateral lower leg likely due to foot/ankle weakness, Difficulty with sidelying ankle ER AROM. He purchased medical shoes and sandals with good arch support and feel they are beneficial. Continued with core/LE strengthening today without increased pain.     PT Next Visit Plan  Continue strength and balance.   gait , knee pain may be limiting factor.  Ask about lifting needs , practice lifting again. review ankle  exercises    PT Home Exercise Plan  tandem balance , heel raises, hip abduction stand, wall sit , hip ext stretch (runners) and adductor stretching in standing, Abdominal set with exhaling in various positions.     Consulted and Agree with Plan of Care  Patient       Patient will benefit  from skilled therapeutic intervention in order to improve the following deficits and impairments:  Decreased strength, Pain, Decreased balance  Visit Diagnosis: Muscle weakness (generalized)  Unsteadiness on feet  Chronic bilateral low back pain without sciatica     Problem List Patient Active Problem List   Diagnosis Date Noted  . Essential hypertension 02/16/2018  . Sepsis due to skin infection ([place]) 02/16/2018  . Chronic pain 02/16/2018  . Cellulitis  of left lower extremity   . Lumbar radiculopathy, chronic 01/24/2018  . Lumbar stenosis with neurogenic claudication 03/23/2017  . Hypothyroidism 07/25/2015  . Primary osteoarthritis of right knee 07/23/2015  . Primary osteoarthritis of knee 07/23/2015    Dorene Ar, [place] 04/25/2018, 9:16 AM  [facility] [address] [place], [place], [address] Phone: [phone]   Fax:  [phone]  Name: Larry [name] MRN: [id number] Date of Birth: Sep 27, 1951

## 2018-04-27 ENCOUNTER — Ambulatory Visit: Payer: Medicare Other | Admitting: Physical Therapy

## 2018-04-27 ENCOUNTER — Encounter: Payer: Self-pay | Admitting: Physical Therapy

## 2018-04-27 DIAGNOSIS — M6281 Muscle weakness (generalized): Secondary | ICD-10-CM

## 2018-04-27 DIAGNOSIS — M545 Low back pain, unspecified: Secondary | ICD-10-CM

## 2018-04-27 DIAGNOSIS — R2681 Unsteadiness on feet: Secondary | ICD-10-CM

## 2018-04-27 DIAGNOSIS — G8929 Other chronic pain: Secondary | ICD-10-CM | POA: Diagnosis not present

## 2018-04-27 NOTE — Therapy (Signed)
[place], [place], [address] Phone: [phone]   Fax:  [phone]  Physical Therapy Treatment  Patient Details  Name: Larry [name] MRN: [id number] Date of Birth: 10-Apr-1951 Referring Provider: Kristeen Miss, MD   Encounter Date: 04/27/2018  PT End of Session - 04/27/18 0728    Visit Number  13    Number of Visits  19    Date for PT Re-Evaluation  05/20/18    Authorization Type  MCR     PT Start Time  0722    PT Stop Time  0800    PT Time Calculation (min)  38 min       Past Medical History:  Diagnosis Date  . Arthritis    "knees; left shoulder" (04/12/2013)  . Asthma    "as a child" (04/12/2013)  . DDD (degenerative disc disease), cervical   . DDD (degenerative disc disease), lumbar   . Difficult intubation    2012   CERVICAL FUSION   . Headache(784.0)    VERAPAMIL FOR PREVENTION   . 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 History of chicken pox    as an adult  . Hypoglycemia    last episode 1 yr. ago, just gets jittery  . Hypothyroidism   . Lumbar stenosis   . OSA (obstructive sleep apnea)    "haven't wore mask in years; had OR; still snore" (04/12/2013)  . Urinary frequency     Past Surgical History:  Procedure Laterality Date  . BACK SURGERY    . CERVICAL FUSION  2002; 2012  . CYSTECTOMY    . INGUINAL HERNIA REPAIR Bilateral 1997  . KNEE ARTHROSCOPY Left ~ 1997; ~ 2005  . LUMBAR FUSION  2007; 04/11/2013  . PENILE DEBRIDEMENT  ~ 2011   X 3  FOR MRSA INFECTION    . SHOULDER ARTHROSCOPY Left ~ 2003  . TONSILLECTOMY  1990's  . TOTAL KNEE ARTHROPLASTY Right 07/23/2015  . TOTAL KNEE ARTHROPLASTY Right 07/23/2015   Procedure: TOTAL KNEE ARTHROPLASTY;  Surgeon: Garald Balding, MD;  Location: [facility];  Service: Orthopedics;  Laterality: Right;  . UVULOPALATOPHARYNGOPLASTY  (UPPP)/TONSILLECTOMY/SEPTOPLASTY  1990's  . VASECTOMY      There were no vitals filed for this visit.  Subjective Assessment - 04/27/18 0727    Subjective  Recieved injection in side of heel at foot doctor. MD said I have a tendon tearing on my heel so he immobilized my ankle to not allow twisting.     Currently in Pain?  No/denies                       [facility] Adult PT Treatment/Exercise - 04/27/18 0001      Lumbar Exercises: Supine   Ab Set  10 reps    Bent Knee Raise  20 reps abdominal bracing    Bridge  10 reps    Straight Leg Raise  20 reps;Other (comment) 2x10 RT/LT cued to add abdominals      Knee/Hip Exercises: Stretches   Piriformis Stretch  Right;Left;2 reps;30 seconds    Other Knee/Hip Stretches  slant board stretch 2 x 1 min      Knee/Hip Exercises: Aerobic   Nustep  LE x 5  min Level 5       Knee/Hip Exercises: Standing   Heel Raises  Both;20 reps;Limitations  Heel Raises Limitations  and toe raises x 20    Knee Flexion  20 reps;Limitations    Knee Flexion Limitations  5 #    Hip Flexion  Right;Left;20 reps    Hip Flexion Limitations  5#    Hip Abduction  Right;Left;Limitations;20 reps    Abduction Limitations  5#    Hip Extension  Left;Right;Limitations;20 reps    Extension Limitations  5#    Forward Step Up  20 reps;Step Height: 6";Right;Left    SLS  5 sec on right, 23 sec or left    Other Standing Knee Exercises  tandem trials 30 sec best       Knee/Hip Exercises: Sidelying   Hip ABduction  20 reps;Right;Left               PT Short Term Goals - 04/11/18 0752      PT SHORT TERM GOAL #1   Title  He will be independent with iniital HEp    Time  4    Period  Weeks    Status  Achieved      PT SHORT TERM GOAL #2   Title  He will report greater ease with walking and lifting leg up to dry leg    Time  4    Period  Weeks    Status  Achieved      PT SHORT TERM GOAL #4   Title  Single leg balance improved to 5 sec RT and LT leg     Time  4    Period  Weeks    Status  Achieved        PT Long Term Goals - 04/18/18 0735      PT LONG TERM GOAL #1   Title  He will be independent wih all HEP issued    Status  On-going      PT LONG TERM GOAL #2   Title  He will have max 3/10 LBP with home tasks    Baseline  not lifting at this point    Status  Partially Met      PT LONG TERM GOAL #3   Title  He will have 3/10 max pain doing normal home tasks  without brace if allowed by MD.     Status  Achieved      PT LONG TERM GOAL #4   Title  He will e able to walk 2-3 mile with no incr pain.     Status  On-going      PT LONG TERM GOAL #5   Title  FOTO score will improve to 42% limited to demo incr functional activity    Status  Achieved            Plan - 04/27/18 0751    Clinical Impression Statement  New ankle brace from foot doctor is helping, Can walk faster and navigate stairs better per patient. Continued with LE and core strengh for progress toward completion of goals.     PT Next Visit Plan  Continue strength and balance.   gait , knee pain may be limiting factor.  Ask about lifting needs , practice lifting again. review ankle  exercises    PT Home Exercise Plan  tandem balance , heel raises, hip abduction stand, wall sit , hip ext stretch (runners) and adductor stretching in standing, Abdominal set with exhaling in various positions.     Consulted and Agree with Plan of Care  Patient  Patient will benefit from skilled therapeutic intervention in order to improve the following deficits and impairments:  Decreased strength, Pain, Decreased balance  Visit Diagnosis: Muscle weakness (generalized)  Unsteadiness on feet  Chronic bilateral low back pain without sciatica     Problem List Patient Active Problem List   Diagnosis Date Noted  . Essential hypertension 02/16/2018  . Sepsis due to skin infection ([place]) 02/16/2018  . Chronic pain 02/16/2018  . Cellulitis of left lower extremity   .  Lumbar radiculopathy, chronic 01/24/2018  . Lumbar stenosis with neurogenic claudication 03/23/2017  . Hypothyroidism 07/25/2015  . Primary osteoarthritis of right knee 07/23/2015  . Primary osteoarthritis of knee 07/23/2015    Dorene Ar, [place] 04/27/2018, 8:02 AM  [facility] [address] [place], [place], [address] Phone: [phone]   Fax:  [phone]  Name: Larry [name] MRN: [id number] Date of Birth: September 28, 1951

## 2018-04-28 DIAGNOSIS — M79671 Pain in right foot: Secondary | ICD-10-CM | POA: Diagnosis not present

## 2018-04-28 DIAGNOSIS — R201 Hypoesthesia of skin: Secondary | ICD-10-CM | POA: Diagnosis not present

## 2018-04-28 DIAGNOSIS — G5601 Carpal tunnel syndrome, right upper limb: Secondary | ICD-10-CM | POA: Diagnosis not present

## 2018-04-28 DIAGNOSIS — M5412 Radiculopathy, cervical region: Secondary | ICD-10-CM | POA: Diagnosis not present

## 2018-04-28 DIAGNOSIS — M5417 Radiculopathy, lumbosacral region: Secondary | ICD-10-CM | POA: Diagnosis not present

## 2018-04-28 DIAGNOSIS — R202 Paresthesia of skin: Secondary | ICD-10-CM | POA: Diagnosis not present

## 2018-05-04 ENCOUNTER — Ambulatory Visit: Payer: Medicare Other | Attending: Internal Medicine

## 2018-05-04 DIAGNOSIS — M6281 Muscle weakness (generalized): Secondary | ICD-10-CM | POA: Diagnosis not present

## 2018-05-04 DIAGNOSIS — G8929 Other chronic pain: Secondary | ICD-10-CM | POA: Diagnosis not present

## 2018-05-04 DIAGNOSIS — M545 Low back pain: Secondary | ICD-10-CM | POA: Insufficient documentation

## 2018-05-04 DIAGNOSIS — R2681 Unsteadiness on feet: Secondary | ICD-10-CM

## 2018-05-04 NOTE — Therapy (Signed)
[place], [place], [address] Phone: [phone]   Fax:  [phone]  Physical Therapy Treatment  Patient Details  Name: Larry [name] MRN: [id number] Date of Birth: 03-09-1951 Referring Provider: Kristeen Miss, MD   Encounter Date: 05/04/2018  PT End of Session - 05/04/18 0659    Visit Number  14    Number of Visits  19    Date for PT Re-Evaluation  05/20/18    Authorization Type  MCR     Authorization Time Period  Kx at visit 15  Progress visit 20    PT Start Time  0656    PT Stop Time  0735    PT Time Calculation (min)  39 min    Activity Tolerance  Patient tolerated treatment well    Behavior During Therapy  [facility] for tasks assessed/performed       Past Medical History:  Diagnosis Date  . Arthritis    "knees; left shoulder" (04/12/2013)  . Asthma    "as a child" (04/12/2013)  . DDD (degenerative disc disease), cervical   . DDD (degenerative disc disease), lumbar   . Difficult intubation    2012   CERVICAL FUSION   . Headache(784.0)    VERAPAMIL FOR PREVENTION   . 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 History of chicken pox    as an adult  . Hypoglycemia    last episode 1 yr. ago, just gets jittery  . Hypothyroidism   . Lumbar stenosis   . OSA (obstructive sleep apnea)    "haven't wore mask in years; had OR; still snore" (04/12/2013)  . Urinary frequency     Past Surgical History:  Procedure Laterality Date  . BACK SURGERY    . CERVICAL FUSION  2002; 2012  . CYSTECTOMY    . INGUINAL HERNIA REPAIR Bilateral 1997  . KNEE ARTHROSCOPY Left ~ 1997; ~ 2005  . LUMBAR FUSION  2007; 04/11/2013  . PENILE DEBRIDEMENT  ~ 2011   X 3  FOR MRSA INFECTION    . SHOULDER ARTHROSCOPY Left ~ 2003  . TONSILLECTOMY  1990's  . TOTAL KNEE ARTHROPLASTY Right 07/23/2015  . TOTAL KNEE ARTHROPLASTY Right 07/23/2015    Procedure: TOTAL KNEE ARTHROPLASTY;  Surgeon: Garald Balding, MD;  Location: [place];  Service: Orthopedics;  Laterality: Right;  . UVULOPALATOPHARYNGOPLASTY (UPPP)/TONSILLECTOMY/SEPTOPLASTY  1990's  . VASECTOMY      There were no vitals filed for this visit.  Subjective Assessment - 05/04/18 0700    Subjective  Reports tear in tendong at heel. Neurologist states nerve damage in RT leg . Back doing great .  No pain. Bending more without pain. Gabapentin doubled.  Can stnd for 30 min now    Currently in Pain?  No/denies                       [facility] Adult PT Treatment/Exercise - 05/04/18 0001      Neuro Re-ed    Neuro Re-ed Details   tandem standce x 3 RT /Lt  best 30 sec times.       Knee/Hip Exercises: Stretches   Other Knee/Hip Stretches  slant board stretch 2 x 1 min      Knee/Hip Exercises: Aerobic   Nustep  LE x 5  min Level 5  Knee/Hip Exercises: Standing   Knee Flexion  Right;Left;15 reps    Knee Flexion Limitations  6#    Hip Flexion  Right;Left;15 reps    Hip Flexion Limitations  6#    Hip Abduction  Right;Left;Limitations;20 reps    Abduction Limitations  6#    Hip Extension  Right;Left    Extension Limitations  6#    Forward Step Up  20 reps;Step Height: 6";Right;Left    SLS  x 8  neithe foot more than 5 sec today    Other Standing Knee Exercises  inversion /eversion  RT ankle x 10      Knee/Hip Exercises: Supine   Other Supine Knee/Hip Exercises  Hip hinge RT/LT x 8 5 sec hold cued for technique  Then biilateral hip hinge x 10 5 sec       Knee/Hip Exercises: Sidelying   Other Sidelying Knee/Hip Exercises  sidelying ankle eversion AROM and AAROM x 15 reps               PT Short Term Goals - 04/11/18 6269      PT SHORT TERM GOAL #1   Title  He will be independent with iniital HEp    Time  4    Period  Weeks    Status  Achieved      PT SHORT TERM GOAL #2   Title  He will report greater ease with walking and lifting leg up to  dry leg    Time  4    Period  Weeks    Status  Achieved      PT SHORT TERM GOAL #4   Title  Single leg balance improved to 5 sec RT and LT leg    Time  4    Period  Weeks    Status  Achieved        PT Long Term Goals - 05/04/18 4854      PT LONG TERM GOAL #1   Title  He will be independent wih all HEP issued    Status  On-going      PT LONG TERM GOAL #2   Title  He will have max 3/10 LBP with home tasks    Baseline  no back pain    Status  Achieved      PT LONG TERM GOAL #3   Title  He will have 3/10 max pain doing normal home tasks  without brace if allowed by MD.     Baseline  no  back pain     Status  Achieved      PT LONG TERM GOAL #4   Title  He will be able to walk 2-3 mile with no incr pain.     Baseline  maybe 1/4 mile per pt .     Status  On-going      PT LONG TERM GOAL #5   Title  FOTO score will improve to 42% limited to demo incr functional activity    Status  Achieved            Plan - 05/04/18 0703    Clinical Impression Statement  Doing well post back surgery and limts now ar e with LT knee OA and weakness in ankle creating decr balance .  He has rolled RT ankle and may need brace to decr risk of ankle sprain. He will talk to his podiatrist this week.  Plan on finishing to end of month then discharge    PT  Frequency  2x / week 4x/week error 2 weeks ago    PT Duration  8 weeks    PT Treatment/Interventions  Electrical Stimulation;Moist Heat;Therapeutic exercise;Patient/family education;Manual techniques;Dry needling    PT Next Visit Plan  Continue strength and balance.   gait , knee pain may be limiting factor. , practice lifting again    PT Home Exercise Plan  tandem balance , heel raises, hip abduction stand, wall sit , hip ext stretch (runners) and adductor stretching in standing, Abdominal set with exhaling in various positions.     Consulted and Agree with Plan of Care  Patient       Patient will benefit from skilled therapeutic intervention  in order to improve the following deficits and impairments:  Decreased strength, Pain, Decreased balance  Visit Diagnosis: Muscle weakness (generalized)  Unsteadiness on feet  Chronic bilateral low back pain without sciatica     Problem List Patient Active Problem List   Diagnosis Date Noted  . Essential hypertension 02/16/2018  . Sepsis due to skin infection ([place]) 02/16/2018  . Chronic pain 02/16/2018  . Cellulitis of left lower extremity   . Lumbar radiculopathy, chronic 01/24/2018  . Lumbar stenosis with neurogenic claudication 03/23/2017  . Hypothyroidism 07/25/2015  . Primary osteoarthritis of right knee 07/23/2015  . Primary osteoarthritis of knee 07/23/2015    Darrel Hoover PT  05/04/2018, 7:48 AM  [facility] [address] [place], [place], [address] Phone: [phone]   Fax:  [phone]  Name: ABOU STERKEL MRN: [id number] Date of Birth: 1950-11-07

## 2018-05-05 DIAGNOSIS — M659 Synovitis and tenosynovitis, unspecified: Secondary | ICD-10-CM | POA: Diagnosis not present

## 2018-05-05 DIAGNOSIS — M19071 Primary osteoarthritis, right ankle and foot: Secondary | ICD-10-CM | POA: Diagnosis not present

## 2018-05-05 DIAGNOSIS — M722 Plantar fascial fibromatosis: Secondary | ICD-10-CM | POA: Diagnosis not present

## 2018-05-06 ENCOUNTER — Ambulatory Visit: Payer: Medicare Other

## 2018-05-06 DIAGNOSIS — G8929 Other chronic pain: Secondary | ICD-10-CM

## 2018-05-06 DIAGNOSIS — R2681 Unsteadiness on feet: Secondary | ICD-10-CM | POA: Diagnosis not present

## 2018-05-06 DIAGNOSIS — M545 Low back pain: Secondary | ICD-10-CM

## 2018-05-06 DIAGNOSIS — M6281 Muscle weakness (generalized): Secondary | ICD-10-CM

## 2018-05-06 NOTE — Therapy (Signed)
[place], [place], [address] Phone: [phone]   Fax:  [phone]  Physical Therapy Treatment  Patient Details  Name: Larry [name] MRN: [id number] Date of Birth: Sep 20, 1951 Referring Provider: Kristeen Miss, MD   Encounter Date: 05/06/2018  PT End of Session - 05/06/18 0702    Visit Number  15    Number of Visits  19    Date for PT Re-Evaluation  05/20/18    Authorization Type  MCR     Authorization Time Period  Kx at visit 15  Progress visit 20    PT Start Time  0700    PT Stop Time  0745    PT Time Calculation (min)  45 min    Activity Tolerance  Patient tolerated treatment well    Behavior During Therapy  [facility] for tasks assessed/performed       Past Medical History:  Diagnosis Date  . Arthritis    "knees; left shoulder" (04/12/2013)  . Asthma    "as a child" (04/12/2013)  . DDD (degenerative disc disease), cervical   . DDD (degenerative disc disease), lumbar   . Difficult intubation    2012   CERVICAL FUSION   . Headache(784.0)    VERAPAMIL FOR PREVENTION   . 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 History of chicken pox    as an adult  . Hypoglycemia    last episode 1 yr. ago, just gets jittery  . Hypothyroidism   . Lumbar stenosis   . OSA (obstructive sleep apnea)    "haven't wore mask in years; had OR; still snore" (04/12/2013)  . Urinary frequency     Past Surgical History:  Procedure Laterality Date  . BACK SURGERY    . CERVICAL FUSION  2002; 2012  . CYSTECTOMY    . INGUINAL HERNIA REPAIR Bilateral 1997  . KNEE ARTHROSCOPY Left ~ 1997; ~ 2005  . LUMBAR FUSION  2007; 04/11/2013  . PENILE DEBRIDEMENT  ~ 2011   X 3  FOR MRSA INFECTION    . SHOULDER ARTHROSCOPY Left ~ 2003  . TONSILLECTOMY  1990's  . TOTAL KNEE ARTHROPLASTY Right 07/23/2015  . TOTAL KNEE ARTHROPLASTY Right 07/23/2015    Procedure: TOTAL KNEE ARTHROPLASTY;  Surgeon: Garald Balding, MD;  Location: [place];  Service: Orthopedics;  Laterality: Right;  . UVULOPALATOPHARYNGOPLASTY (UPPP)/TONSILLECTOMY/SEPTOPLASTY  1990's  . VASECTOMY      There were no vitals filed for this visit.  Subjective Assessment - 05/06/18 0703    Subjective  New brace RT ankle helps with walking.        Currently in Pain?  No/denies                       [facility] Adult PT Treatment/Exercise - 05/06/18 0001      Knee/Hip Exercises: Stretches   Other Knee/Hip Stretches  ankle DF stretch with strap for posterior glideLt foot      Knee/Hip Exercises: Aerobic   Nustep  LE x 6  min Level 6       Knee/Hip Exercises: Standing   Knee Flexion  Right;Left;15 reps    Knee Flexion Limitations  6#    Hip Flexion  Right;Left;2 sets;10 reps    Hip Flexion Limitations  6#    Hip Abduction  Right;Left    Abduction Limitations  6#    Hip Extension  Right;Left;2 sets;10 reps    Extension Limitations  6#    Other Standing Knee Exercises  [employer] pulldowns x 20 17 pounds  then across chest pull x 20 RT/Lt with 13 pounds, then overhead press x 20 with 8 pounds.   Then with blue band   for home exer program .       Knee/Hip Exercises: Seated   Long Arc Quad  Right;Left;20 reps;Weights    Long Arc Quad Weight  6 lbs.             PT Education - 05/06/18 0744    Education Details  HEP with band and DF stretching    Person(s) Educated  Patient    Methods  Explanation;Demonstration;Tactile cues;Verbal cues    Comprehension  Returned demonstration;Verbalized understanding       PT Short Term Goals - 04/11/18 0752      PT SHORT TERM GOAL #1   Title  He will be independent with iniital HEp    Time  4    Period  Weeks    Status  Achieved      PT SHORT TERM GOAL #2   Title  He will report greater ease with walking and lifting leg up to dry leg    Time  4    Period  Weeks    Status  Achieved      PT SHORT TERM  GOAL #4   Title  Single leg balance improved to 5 sec RT and LT leg    Time  4    Period  Weeks    Status  Achieved        PT Long Term Goals - 05/04/18 6433      PT LONG TERM GOAL #1   Title  He will be independent wih all HEP issued    Status  On-going      PT LONG TERM GOAL #2   Title  He will have max 3/10 LBP with home tasks    Baseline  no back pain    Status  Achieved      PT LONG TERM GOAL #3   Title  He will have 3/10 max pain doing normal home tasks  without brace if allowed by MD.     Baseline  no  back pain     Status  Achieved      PT LONG TERM GOAL #4   Title  He will be able to walk 2-3 mile with no incr pain.     Baseline  maybe 1/4 mile per pt .     Status  On-going      PT LONG TERM GOAL #5   Title  FOTO score will improve to 42% limited to demo incr functional activity    Status  Achieved            Plan - 05/06/18 0703    Clinical Impression Statement  He is doing well with all exercise. No back pain.  Le weakness most of problem affecting function. will MMT next week and assess extension versus discharge end of plan.    PT Treatment/Interventions  Electrical Stimulation;Moist Heat;Therapeutic exercise;Patient/family education;Manual techniques;Dry needling    PT Next Visit Plan  Continue strength and balance.   gait , knee pain may be limiting factor. , MMT    PT Home Exercise Plan  tandem balance , heel raises, hip abduction stand, wall sit , hip ext stretch (runners)  and adductor stretching in standing, Abdominal set with exhaling in various positions.     Consulted and Agree with Plan of Care  Patient       Patient will benefit from skilled therapeutic intervention in order to improve the following deficits and impairments:  Decreased strength, Pain, Decreased balance  Visit Diagnosis: Muscle weakness (generalized)  Unsteadiness on feet  Chronic bilateral low back pain without sciatica     Problem List Patient Active Problem List    Diagnosis Date Noted  . Essential hypertension 02/16/2018  . Sepsis due to skin infection ([place]) 02/16/2018  . Chronic pain 02/16/2018  . Cellulitis of left lower extremity   . Lumbar radiculopathy, chronic 01/24/2018  . Lumbar stenosis with neurogenic claudication 03/23/2017  . Hypothyroidism 07/25/2015  . Primary osteoarthritis of right knee 07/23/2015  . Primary osteoarthritis of knee 07/23/2015    Darrel Hoover, PT 05/06/2018, 7:55 AM  [facility] [address] [place], [place], [address] Phone: [phone]   Fax:  [phone]  Name: Larry [name] MRN: [id number] Date of Birth: 15-Dec-1950

## 2018-05-06 NOTE — Patient Instructions (Addendum)
Issued blue band for pull down, row, triceps and rotation  exer no spinal movement for strength and stability x 15-20 1x/day, DF stretch with strap standiing 30-60 sec or reps x 15-20, 1-2x/day

## 2018-05-09 ENCOUNTER — Encounter: Payer: Self-pay | Admitting: Physical Therapy

## 2018-05-09 ENCOUNTER — Ambulatory Visit: Payer: Medicare Other | Admitting: Physical Therapy

## 2018-05-09 DIAGNOSIS — M6281 Muscle weakness (generalized): Secondary | ICD-10-CM

## 2018-05-09 DIAGNOSIS — R2681 Unsteadiness on feet: Secondary | ICD-10-CM | POA: Diagnosis not present

## 2018-05-09 DIAGNOSIS — M545 Low back pain: Secondary | ICD-10-CM

## 2018-05-09 DIAGNOSIS — G8929 Other chronic pain: Secondary | ICD-10-CM | POA: Diagnosis not present

## 2018-05-09 NOTE — Therapy (Signed)
[place], [place], [address] Phone: [phone]   Fax:  [phone]  Physical Therapy Treatment  Patient Details  Name: Larry [name] MRN: [id number] Date of Birth: Mar 25, 1951 Referring Provider: Kristeen Miss, MD   Encounter Date: 05/09/2018  PT End of Session - 05/09/18 0826    Visit Number  16    Number of Visits  19    Date for PT Re-Evaluation  05/20/18    Authorization Type  MCR     Authorization Time Period  Kx at visit 15  Progress visit 20    PT Start Time  0715    PT Stop Time  0800    PT Time Calculation (min)  45 min       Past Medical History:  Diagnosis Date  . Arthritis    "knees; left shoulder" (04/12/2013)  . Asthma    "as a child" (04/12/2013)  . DDD (degenerative disc disease), cervical   . DDD (degenerative disc disease), lumbar   . Difficult intubation    2012   CERVICAL FUSION   . Headache(784.0)    VERAPAMIL FOR PREVENTION   . 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 History of chicken pox    as an adult  . Hypoglycemia    last episode 1 yr. ago, just gets jittery  . Hypothyroidism   . Lumbar stenosis   . OSA (obstructive sleep apnea)    "haven't wore mask in years; had OR; still snore" (04/12/2013)  . Urinary frequency     Past Surgical History:  Procedure Laterality Date  . BACK SURGERY    . CERVICAL FUSION  2002; 2012  . CYSTECTOMY    . INGUINAL HERNIA REPAIR Bilateral 1997  . KNEE ARTHROSCOPY Left ~ 1997; ~ 2005  . LUMBAR FUSION  2007; 04/11/2013  . PENILE DEBRIDEMENT  ~ 2011   X 3  FOR MRSA INFECTION    . SHOULDER ARTHROSCOPY Left ~ 2003  . TONSILLECTOMY  1990's  . TOTAL KNEE ARTHROPLASTY Right 07/23/2015  . TOTAL KNEE ARTHROPLASTY Right 07/23/2015   Procedure: TOTAL KNEE ARTHROPLASTY;  Surgeon: Garald Balding, MD;  Location: [place];  Service: Orthopedics;   Laterality: Right;  . UVULOPALATOPHARYNGOPLASTY (UPPP)/TONSILLECTOMY/SEPTOPLASTY  1990's  . VASECTOMY      There were no vitals filed for this visit.  Subjective Assessment - 05/09/18 0825    Subjective  Shoes feel too big. Makes me feel "off" sometimes.     Currently in Pain?  No/denies                       [facility] Adult PT Treatment/Exercise - 05/09/18 0001      Neuro Re-ed    Neuro Re-ed Details   tandem gait, tandem stance trials 30 sec +, alternating and unilateral step taps then added foam pad, upper trunk rotations on and off foam pad, narrow balance on foam pad with pertubatins eyes open and closes, intermittent CGA and UE assist.       Knee/Hip Exercises: Stretches   Other Knee/Hip Stretches  slant board stretch 2 x 1 min      Knee/Hip Exercises: Aerobic   Nustep  LE x 6  min Level 6       Knee/Hip Exercises: Standing   Knee Flexion  Right;Left;15 reps    Knee Flexion  Limitations  green band     Hip Flexion  Right;Left;2 sets;10 reps    Hip Flexion Limitations  green band     Hip Abduction  Right;Left    Abduction Limitations  green band     Hip Extension  Right;Left;2 sets;10 reps    Extension Limitations  green band     Other Standing Knee Exercises  Seated row and lat pull down 35# each                PT Short Term Goals - 04/11/18 0752      PT SHORT TERM GOAL #1   Title  He will be independent with iniital HEp    Time  4    Period  Weeks    Status  Achieved      PT SHORT TERM GOAL #2   Title  He will report greater ease with walking and lifting leg up to dry leg    Time  4    Period  Weeks    Status  Achieved      PT SHORT TERM GOAL #4   Title  Single leg balance improved to 5 sec RT and LT leg    Time  4    Period  Weeks    Status  Achieved        PT Long Term Goals - 05/04/18 3570      PT LONG TERM GOAL #1   Title  He will be independent wih all HEP issued    Status  On-going      PT LONG TERM GOAL #2   Title  He  will have max 3/10 LBP with home tasks    Baseline  no back pain    Status  Achieved      PT LONG TERM GOAL #3   Title  He will have 3/10 max pain doing normal home tasks  without brace if allowed by MD.     Baseline  no  back pain     Status  Achieved      PT LONG TERM GOAL #4   Title  He will be able to walk 2-3 mile with no incr pain.     Baseline  maybe 1/4 mile per pt .     Status  On-going      PT LONG TERM GOAL #5   Title  FOTO score will improve to 42% limited to demo incr functional activity    Status  Achieved            Plan - 05/09/18 1779    Clinical Impression Statement  Pt reports 30% improvement in LLE strength. Will test MMT next visit. Continued with balance challenges with cues to engage core. Reviewed standing core exercises given last visit. No increased pain.     PT Next Visit Plan  Continue strength and balance.   gait , knee pain may be limiting factor. , MMT    PT Home Exercise Plan  tandem balance , heel raises, hip abduction stand, wall sit , hip ext stretch (runners) and adductor stretching in standing, Abdominal set with exhaling in various positions.     Consulted and Agree with Plan of Care  Patient       Patient will benefit from skilled therapeutic intervention in order to improve the following deficits and impairments:  Decreased strength, Pain, Decreased balance  Visit Diagnosis: Muscle weakness (generalized)  Unsteadiness on feet  Chronic bilateral low back pain without  sciatica     Problem List Patient Active Problem List   Diagnosis Date Noted  . Essential hypertension 02/16/2018  . Sepsis due to skin infection ([place]) 02/16/2018  . Chronic pain 02/16/2018  . Cellulitis of left lower extremity   . Lumbar radiculopathy, chronic 01/24/2018  . Lumbar stenosis with neurogenic claudication 03/23/2017  . Hypothyroidism 07/25/2015  . Primary osteoarthritis of right knee 07/23/2015  . Primary osteoarthritis of knee 07/23/2015     Dorene Ar, [place] 05/09/2018, 8:54 AM  [facility] [address] [place], [place], [address] Phone: [phone]   Fax:  [phone]  Name: Larry [name] MRN: [id number] Date of Birth: 25-May-1951

## 2018-05-10 DIAGNOSIS — M545 Low back pain: Secondary | ICD-10-CM | POA: Diagnosis not present

## 2018-05-10 DIAGNOSIS — R201 Hypoesthesia of skin: Secondary | ICD-10-CM | POA: Diagnosis not present

## 2018-05-10 DIAGNOSIS — M79671 Pain in right foot: Secondary | ICD-10-CM | POA: Diagnosis not present

## 2018-05-10 DIAGNOSIS — R202 Paresthesia of skin: Secondary | ICD-10-CM | POA: Diagnosis not present

## 2018-05-11 ENCOUNTER — Encounter: Payer: Self-pay | Admitting: Physical Therapy

## 2018-05-11 ENCOUNTER — Ambulatory Visit: Payer: Medicare Other | Admitting: Physical Therapy

## 2018-05-11 DIAGNOSIS — M6281 Muscle weakness (generalized): Secondary | ICD-10-CM | POA: Diagnosis not present

## 2018-05-11 DIAGNOSIS — M545 Low back pain: Secondary | ICD-10-CM

## 2018-05-11 DIAGNOSIS — G8929 Other chronic pain: Secondary | ICD-10-CM

## 2018-05-11 DIAGNOSIS — R2681 Unsteadiness on feet: Secondary | ICD-10-CM

## 2018-05-11 NOTE — Therapy (Signed)
[place], [place], [address] Phone: [phone]   Fax:  [phone]  Physical Therapy Treatment  Patient Details  Name: Larry [name] MRN: [id number] Date of Birth: 12-Dec-1950 Referring Provider: Kristeen Miss, MD   Encounter Date: 05/11/2018  PT End of Session - 05/11/18 0729    Visit Number  17    Number of Visits  19    Date for PT Re-Evaluation  05/20/18    Authorization Type  MCR     Authorization Time Period  Kx at visit 15  Progress visit 20    PT Start Time  0715    PT Stop Time  0800    PT Time Calculation (min)  45 min       Past Medical History:  Diagnosis Date  . Arthritis    "knees; left shoulder" (04/12/2013)  . Asthma    "as a child" (04/12/2013)  . DDD (degenerative disc disease), cervical   . DDD (degenerative disc disease), lumbar   . Difficult intubation    2012   CERVICAL FUSION   . Headache(784.0)    VERAPAMIL FOR PREVENTION   . 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 History of chicken pox    as an adult  . Hypoglycemia    last episode 1 yr. ago, just gets jittery  . Hypothyroidism   . Lumbar stenosis   . OSA (obstructive sleep apnea)    "haven't wore mask in years; had OR; still snore" (04/12/2013)  . Urinary frequency     Past Surgical History:  Procedure Laterality Date  . BACK SURGERY    . CERVICAL FUSION  2002; 2012  . CYSTECTOMY    . INGUINAL HERNIA REPAIR Bilateral 1997  . KNEE ARTHROSCOPY Left ~ 1997; ~ 2005  . LUMBAR FUSION  2007; 04/11/2013  . PENILE DEBRIDEMENT  ~ 2011   X 3  FOR MRSA INFECTION    . SHOULDER ARTHROSCOPY Left ~ 2003  . TONSILLECTOMY  1990's  . TOTAL KNEE ARTHROPLASTY Right 07/23/2015  . TOTAL KNEE ARTHROPLASTY Right 07/23/2015   Procedure: TOTAL KNEE ARTHROPLASTY;  Surgeon: Garald Balding, MD;  Location: [facility];  Service: Orthopedics;   Laterality: Right;  . UVULOPALATOPHARYNGOPLASTY (UPPP)/TONSILLECTOMY/SEPTOPLASTY  1990's  . VASECTOMY      There were no vitals filed for this visit.  Subjective Assessment - 05/11/18 0806    Subjective  Had trigger point injections yesterday in lower back. Maybe feel a little better.     Currently in Pain?  No/denies                       [facility] Adult PT Treatment/Exercise - 05/11/18 0001      Neuro Re-ed    Neuro Re-ed Details   tandem gait, tandem stance trials 30 sec +, alternating and unilateral step taps then added foam pad, upper trunk rotations on and off foam pad, narrow balance on foam pad with pertubatins eyes open and closes, intermittent CGA and UE assist. , one foot on foam with ball toss up and catch       Lumbar Exercises: Supine   Ab Set  10 reps    Bent Knee Raise  20 reps    Straight Leg Raise  20 reps    Other Supine Lumbar Exercises  all with abdominal draw in  Knee/Hip Exercises: Stretches   Other Knee/Hip Stretches  slant board stretch 2 x 1 min      Knee/Hip Exercises: Aerobic   Nustep  LE x 6  min Level 6       Knee/Hip Exercises: Standing   Knee Flexion  Right;Left;15 reps    Knee Flexion Limitations  green band     Hip Flexion  Right;Left;2 sets;10 reps    Hip Flexion Limitations  green band     Hip Abduction  Right;Left    Abduction Limitations  green band     Hip Extension  Right;Left;2 sets;10 reps    Extension Limitations  green band     SLS  Right 15 sec, 30 sec left     Other Standing Knee Exercises  Seated row and lat pull down 35# each       Knee/Hip Exercises: Seated   Sit to Sand  20 reps;without UE support               PT Short Term Goals - 04/11/18 0752      PT SHORT TERM GOAL #1   Title  He will be independent with iniital HEp    Time  4    Period  Weeks    Status  Achieved      PT SHORT TERM GOAL #2   Title  He will report greater ease with walking and lifting leg up to dry leg    Time  4     Period  Weeks    Status  Achieved      PT SHORT TERM GOAL #4   Title  Single leg balance improved to 5 sec RT and LT leg    Time  4    Period  Weeks    Status  Achieved        PT Long Term Goals - 05/04/18 6203      PT LONG TERM GOAL #1   Title  He will be independent wih all HEP issued    Status  On-going      PT LONG TERM GOAL #2   Title  He will have max 3/10 LBP with home tasks    Baseline  no back pain    Status  Achieved      PT LONG TERM GOAL #3   Title  He will have 3/10 max pain doing normal home tasks  without brace if allowed by MD.     Baseline  no  back pain     Status  Achieved      PT LONG TERM GOAL #4   Title  He will be able to walk 2-3 mile with no incr pain.     Baseline  maybe 1/4 mile per pt .     Status  On-going      PT LONG TERM GOAL #5   Title  FOTO score will improve to 42% limited to demo incr functional activity    Status  Achieved            Plan - 05/11/18 0750    Clinical Impression Statement  Pt overall is feeling better and taking less pain medicine per his report. He reports decreased difficulty navigating stairs due to left knee pain. Yesterday he reports navigating down 2 flights of stairs reciprocally with hand rail.     PT Next Visit Plan  Continue strength and balance.   gait , knee pain may be limiting factor. , MMT  PT Home Exercise Plan  tandem balance , heel raises, hip abduction stand, wall sit , hip ext stretch (runners) and adductor stretching in standing, Abdominal set with exhaling in various positions.     Consulted and Agree with Plan of Care  Patient       Patient will benefit from skilled therapeutic intervention in order to improve the following deficits and impairments:  Decreased strength, Pain, Decreased balance  Visit Diagnosis: Muscle weakness (generalized)  Unsteadiness on feet  Chronic bilateral low back pain without sciatica     Problem List Patient Active Problem List   Diagnosis Date  Noted  . Essential hypertension 02/16/2018  . Sepsis due to skin infection ([place]) 02/16/2018  . Chronic pain 02/16/2018  . Cellulitis of left lower extremity   . Lumbar radiculopathy, chronic 01/24/2018  . Lumbar stenosis with neurogenic claudication 03/23/2017  . Hypothyroidism 07/25/2015  . Primary osteoarthritis of right knee 07/23/2015  . Primary osteoarthritis of knee 07/23/2015    Dorene Ar, [place] 05/11/2018, 8:45 AM  [facility] [address] [place], [place], [address] Phone: [phone]   Fax:  [phone]  Name: Larry [name] MRN: [id number] Date of Birth: [birth year]

## 2018-05-17 ENCOUNTER — Ambulatory Visit: Payer: Medicare Other

## 2018-05-17 DIAGNOSIS — M545 Low back pain, unspecified: Secondary | ICD-10-CM

## 2018-05-17 DIAGNOSIS — M6281 Muscle weakness (generalized): Secondary | ICD-10-CM

## 2018-05-17 DIAGNOSIS — G8929 Other chronic pain: Secondary | ICD-10-CM

## 2018-05-17 DIAGNOSIS — R2681 Unsteadiness on feet: Secondary | ICD-10-CM | POA: Diagnosis not present

## 2018-05-17 NOTE — Therapy (Signed)
[place], [place], [address] Phone: [phone]   Fax:  [phone]  Physical Therapy Treatment  Patient Details  Name: Larry [name] MRN: [id number] Date of Birth: [birth year] Referring Provider: Kristeen Miss, MD   Encounter Date: 05/17/2018  PT End of Session - 05/17/18 0703    Visit Number  18    Number of Visits  27    Date for PT Re-Evaluation  06/17/18    Authorization Type  MCR     Authorization Time Period  Kx at visit 15  Progress visit 29    PT Start Time  0658    PT Stop Time  0743    PT Time Calculation (min)  45 min    Activity Tolerance  Patient tolerated treatment well    Behavior During Therapy  [facility] for tasks assessed/performed       Past Medical History:  Diagnosis Date  . Arthritis    "knees; left shoulder" (04/12/2013)  . Asthma    "as a child" (04/12/2013)  . DDD (degenerative disc disease), cervical   . DDD (degenerative disc disease), lumbar   . Difficult intubation    2012   CERVICAL FUSION   . Headache(784.0)    VERAPAMIL FOR PREVENTION   . 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 History of chicken pox    as an adult  . Hypoglycemia    last episode 1 yr. ago, just gets jittery  . Hypothyroidism   . Lumbar stenosis   . OSA (obstructive sleep apnea)    "haven't wore mask in years; had OR; still snore" (04/12/2013)  . Urinary frequency     Past Surgical History:  Procedure Laterality Date  . BACK SURGERY    . CERVICAL FUSION  2002; 2012  . CYSTECTOMY    . INGUINAL HERNIA REPAIR Bilateral 1997  . KNEE ARTHROSCOPY Left ~ 1997; ~ 2005  . LUMBAR FUSION  2007; 04/11/2013  . PENILE DEBRIDEMENT  ~ 2011   X 3  FOR MRSA INFECTION    . SHOULDER ARTHROSCOPY Left ~ 2003  . TONSILLECTOMY  1990's  . TOTAL KNEE ARTHROPLASTY Right 07/23/2015  . TOTAL KNEE ARTHROPLASTY Right 07/23/2015    Procedure: TOTAL KNEE ARTHROPLASTY;  Surgeon: Garald Balding, MD;  Location: [facility];  Service: Orthopedics;  Laterality: Right;  . UVULOPALATOPHARYNGOPLASTY (UPPP)/TONSILLECTOMY/SEPTOPLASTY  1990's  . VASECTOMY      There were no vitals filed for this visit.  Subjective Assessment - 05/17/18 0659    Subjective  No pain today.  Feet feel better . Injections helped.   Back feel fine.  Foot still slaps on RT. Feel more comfortable walking with braces. Able to stand without problem to talk to people. Some fatigue with being on feet.  Icy hot on knees daily.   (Pended)     Currently in Pain?  No/denies         [facility] PT Assessment - 05/17/18 0001      Observation/Other Assessments   Focus on Therapeutic Outcomes (FOTO)   30% limited                   [facility] Adult PT Treatment/Exercise - 05/17/18 0001      Therapeutic Activites    Therapeutic Activities  Lifting    Lifting  Reviewed lifting and body mechanics with light and  heavier loads including deadlift and golfers lift technique. He was able to replicate. Also discussed need to stop sittign crunches and to limit bending and twisting to protect back                PT Short Term Goals - 04/11/18 0752      PT SHORT TERM GOAL #1   Title  He will be independent with iniital HEp    Time  4    Period  Weeks    Status  Achieved      PT SHORT TERM GOAL #2   Title  He will report greater ease with walking and lifting leg up to dry leg    Time  4    Period  Weeks    Status  Achieved      PT SHORT TERM GOAL #4   Title  Single leg balance improved to 5 sec RT and LT leg    Time  4    Period  Weeks    Status  Achieved        PT Long Term Goals - 05/17/18 0700      PT LONG TERM GOAL #1   Title  He will be independent wih all HEP issued    Status  On-going      PT LONG TERM GOAL #2   Title  He will have max 3/10 LBP with home tasks    Status  Achieved      PT LONG TERM GOAL #3   Title  He will have  3/10 max pain doing normal home tasks  without brace if allowed by MD.     Status  Achieved      PT LONG TERM GOAL #4   Title  He will be able to walk 2-3 mile with no incr pain.     Baseline  maybe 1/4 mile per pt . no problem doing this    Status  On-going      Additional Long Term Goals   Additional Long Term Goals  Yes      PT LONG TERM GOAL #6   Title  Hip and thigh strength all 4/5 or better to be able to lift and return to work and to protect back.     Time  4    Period  Weeks    Status  New            Plan - 05/17/18 0703    Clinical Impression Statement  He is functionally doing well but is still weak bilaterally.  He would benefit ffrom continued strength in legs due to may get job with [employer] and will need to ift for work.     PT Treatment/Interventions  Electrical Stimulation;Moist Heat;Therapeutic exercise;Patient/family education;Manual techniques;Dry needling    PT Next Visit Plan  Continue strength and balance.   gait , knee pain may be limiting factor. , MMT    PT Home Exercise Plan  tandem balance , heel raises, hip abduction stand, wall sit , hip ext stretch (runners) and adductor stretching in standing, Abdominal set with exhaling in various positions.     Consulted and Agree with Plan of Care  Patient       Patient will benefit from skilled therapeutic intervention in order to improve the following deficits and impairments:  Decreased strength, Pain, Decreased balance  Visit Diagnosis: Muscle weakness (generalized)  Chronic bilateral low back pain without sciatica  Unsteadiness on feet     Problem  List Patient Active Problem List   Diagnosis Date Noted  . Essential hypertension 02/16/2018  . Sepsis due to skin infection ([place]) 02/16/2018  . Chronic pain 02/16/2018  . Cellulitis of left lower extremity   . Lumbar radiculopathy, chronic 01/24/2018  . Lumbar stenosis with neurogenic claudication 03/23/2017  . Hypothyroidism 07/25/2015  . Primary  osteoarthritis of right knee 07/23/2015  . Primary osteoarthritis of knee 07/23/2015    Darrel Hoover  PT 05/17/2018, 8:06 AM  [facility] [address] [place], [place], [address] Phone: [phone]   Fax:  [phone]  Name: Larry [name] MRN: [id number] Date of Birth: [birth year]

## 2018-05-18 DIAGNOSIS — M659 Synovitis and tenosynovitis, unspecified: Secondary | ICD-10-CM | POA: Diagnosis not present

## 2018-05-18 DIAGNOSIS — M722 Plantar fascial fibromatosis: Secondary | ICD-10-CM | POA: Diagnosis not present

## 2018-05-19 ENCOUNTER — Encounter: Payer: Self-pay | Admitting: Physical Therapy

## 2018-05-19 ENCOUNTER — Ambulatory Visit: Payer: Medicare Other | Admitting: Physical Therapy

## 2018-05-19 DIAGNOSIS — R2681 Unsteadiness on feet: Secondary | ICD-10-CM | POA: Diagnosis not present

## 2018-05-19 DIAGNOSIS — M545 Low back pain: Secondary | ICD-10-CM | POA: Diagnosis not present

## 2018-05-19 DIAGNOSIS — G8929 Other chronic pain: Secondary | ICD-10-CM

## 2018-05-19 DIAGNOSIS — M6281 Muscle weakness (generalized): Secondary | ICD-10-CM

## 2018-05-19 NOTE — Therapy (Signed)
[place], [place], [address] Phone: [phone]   Fax:  [phone]  Physical Therapy Treatment  Patient Details  Name: Larry [name] MRN: [id number] Date of Birth: 1950/10/12 Referring Provider: Kristeen Miss, MD   Encounter Date: 05/19/2018  PT End of Session - 05/19/18 0722    Visit Number  19    Number of Visits  27    Date for PT Re-Evaluation  06/17/18    Authorization Type  MCR     PT Start Time  0717    PT Stop Time  0800    PT Time Calculation (min)  43 min       Past Medical History:  Diagnosis Date  . Arthritis    "knees; left shoulder" (04/12/2013)  . Asthma    "as a child" (04/12/2013)  . DDD (degenerative disc disease), cervical   . DDD (degenerative disc disease), lumbar   . Difficult intubation    2012   CERVICAL FUSION   . Headache(784.0)    VERAPAMIL FOR PREVENTION   . 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 History of chicken pox    as an adult  . Hypoglycemia    last episode 1 yr. ago, just gets jittery  . Hypothyroidism   . Lumbar stenosis   . OSA (obstructive sleep apnea)    "haven't wore mask in years; had OR; still snore" (04/12/2013)  . Urinary frequency     Past Surgical History:  Procedure Laterality Date  . BACK SURGERY    . CERVICAL FUSION  2002; 2012  . CYSTECTOMY    . INGUINAL HERNIA REPAIR Bilateral 1997  . KNEE ARTHROSCOPY Left ~ 1997; ~ 2005  . LUMBAR FUSION  2007; 04/11/2013  . PENILE DEBRIDEMENT  ~ 2011   X 3  FOR MRSA INFECTION    . SHOULDER ARTHROSCOPY Left ~ 2003  . TONSILLECTOMY  1990's  . TOTAL KNEE ARTHROPLASTY Right 07/23/2015  . TOTAL KNEE ARTHROPLASTY Right 07/23/2015   Procedure: TOTAL KNEE ARTHROPLASTY;  Surgeon: Garald Balding, MD;  Location: [facility];  Service: Orthopedics;  Laterality: Right;  . UVULOPALATOPHARYNGOPLASTY  (UPPP)/TONSILLECTOMY/SEPTOPLASTY  1990's  . VASECTOMY      There were no vitals filed for this visit.  Subjective Assessment - 05/19/18 0724    Subjective  Getting special inserts for both shoes.     Currently in Pain?  No/denies                       [facility] Adult PT Treatment/Exercise - 05/19/18 0001      Lumbar Exercises: Supine   Bridge  20 reps    Single Leg Bridge  10 reps   2 sets each    Straight Leg Raise  20 reps   2 sets with second set 2#    Other Supine Lumbar Exercises  all with abdominal draw in      Knee/Hip Exercises: Aerobic   Nustep  LE x 6  min Level 6       Knee/Hip Exercises: Machines for Strengthening   Cybex Knee Flexion  45# bilateral 25# single 10x2     Total Gym Leg Press  2,3,4 plates x 20 bilateral        Knee/Hip Exercises: Standing   Forward Step Up  20 reps;Step Height: 8";Hand Hold: 1;Left;Both  Knee/Hip Exercises: Seated   Other Seated Knee/Hip Exercises  ankle eversion isometrics right       Knee/Hip Exercises: Sidelying   Hip ABduction  20 reps;Right;Left    Clams  green x 20each ,reverse 2# x 20 each               PT Short Term Goals - 04/11/18 8563      PT SHORT TERM GOAL #1   Title  He will be independent with iniital HEp    Time  4    Period  Weeks    Status  Achieved      PT SHORT TERM GOAL #2   Title  He will report greater ease with walking and lifting leg up to dry leg    Time  4    Period  Weeks    Status  Achieved      PT SHORT TERM GOAL #4   Title  Single leg balance improved to 5 sec RT and LT leg    Time  4    Period  Weeks    Status  Achieved        PT Long Term Goals - 05/17/18 0700      PT LONG TERM GOAL #1   Title  He will be independent wih all HEP issued    Status  On-going      PT LONG TERM GOAL #2   Title  He will have max 3/10 LBP with home tasks    Status  Achieved      PT LONG TERM GOAL #3   Title  He will have 3/10 max pain doing normal home tasks  without  brace if allowed by MD.     Status  Achieved      PT LONG TERM GOAL #4   Title  He will be able to walk 2-3 mile with no incr pain.     Baseline  maybe 1/4 mile per pt . no problem doing this    Status  On-going      Additional Long Term Goals   Additional Long Term Goals  Yes      PT LONG TERM GOAL #6   Title  Hip and thigh strength all 4/5 or better to be able to lift and return to work and to protect back.     Time  4    Period  Weeks    Status  New            Plan - 05/19/18 0810    Clinical Impression Statement  Strength focus this session. Right lateral hip visually weaker.No pain just fatigue. Will start new job at [employer] soon.     PT Next Visit Plan  Continue strength and balance.   gait , knee pain may be limiting factor.    PT Home Exercise Plan  tandem balance , heel raises, hip abduction stand, wall sit , hip ext stretch (runners) and adductor stretching in standing, Abdominal set with exhaling in various positions.        Patient will benefit from skilled therapeutic intervention in order to improve the following deficits and impairments:  Decreased strength, Pain, Decreased balance  Visit Diagnosis: Muscle weakness (generalized)  Chronic bilateral low back pain without sciatica  Unsteadiness on feet     Problem List Patient Active Problem List   Diagnosis Date Noted  . Essential hypertension 02/16/2018  . Sepsis due to skin infection ([place]) 02/16/2018  . Chronic  pain 02/16/2018  . Cellulitis of left lower extremity   . Lumbar radiculopathy, chronic 01/24/2018  . Lumbar stenosis with neurogenic claudication 03/23/2017  . Hypothyroidism 07/25/2015  . Primary osteoarthritis of right knee 07/23/2015  . Primary osteoarthritis of knee 07/23/2015    Dorene Ar, [place] 05/19/2018, 8:12 AM  [place], [place], [address] Phone: [phone]   Fax:   [phone]  Name: Larry [name] MRN: [id number] Date of Birth: October 06, 1950

## 2018-05-23 ENCOUNTER — Encounter: Payer: Self-pay | Admitting: Physical Therapy

## 2018-05-23 ENCOUNTER — Ambulatory Visit: Payer: Medicare Other | Admitting: Physical Therapy

## 2018-05-23 DIAGNOSIS — M6281 Muscle weakness (generalized): Secondary | ICD-10-CM | POA: Diagnosis not present

## 2018-05-23 DIAGNOSIS — G8929 Other chronic pain: Secondary | ICD-10-CM | POA: Diagnosis not present

## 2018-05-23 DIAGNOSIS — R2681 Unsteadiness on feet: Secondary | ICD-10-CM

## 2018-05-23 DIAGNOSIS — M545 Low back pain: Secondary | ICD-10-CM | POA: Diagnosis not present

## 2018-05-23 NOTE — Therapy (Addendum)
[place], [place], [address] Phone: [phone]   Fax:  [phone]  Physical Therapy Treatment  Patient Details  Name: Larry [name] MRN: [id number] Date of Birth: [birth year] Referring Provider: Kristeen Miss, MD  Progress Note Reporting Period 04/20/18 to 05/23/18  See note below for Objective Data and Assessment of Progress/Goals.      Encounter Date: 05/23/2018  PT End of Session - 05/23/18 0849    Visit Number  20    Number of Visits  27    Date for PT Re-Evaluation  06/17/18    Authorization Type  MCR     Authorization Time Period  Kx at visit 15  Progress visit 20    PT Start Time  0848    PT Stop Time  0926    PT Time Calculation (min)  38 min       Past Medical History:  Diagnosis Date  . Arthritis    "knees; left shoulder" (04/12/2013)  . Asthma    "as a child" (04/12/2013)  . DDD (degenerative disc disease), cervical   . DDD (degenerative disc disease), lumbar   . Difficult intubation    2012   CERVICAL FUSION   . Headache(784.0)    VERAPAMIL FOR PREVENTION   . 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 History of chicken pox    as an adult  . Hypoglycemia    last episode 1 yr. ago, just gets jittery  . Hypothyroidism   . Lumbar stenosis   . OSA (obstructive sleep apnea)    "haven't wore mask in years; had OR; still snore" (04/12/2013)  . Urinary frequency     Past Surgical History:  Procedure Laterality Date  . BACK SURGERY    . CERVICAL FUSION  2002; 2012  . CYSTECTOMY    . INGUINAL HERNIA REPAIR Bilateral 1997  . KNEE ARTHROSCOPY Left ~ 1997; ~ 2005  . LUMBAR FUSION  2007; 04/11/2013  . PENILE DEBRIDEMENT  ~ 2011   X 3  FOR MRSA INFECTION    . SHOULDER ARTHROSCOPY Left ~ 2003  . TONSILLECTOMY  1990's  . TOTAL KNEE ARTHROPLASTY Right 07/23/2015  . TOTAL KNEE ARTHROPLASTY Right  07/23/2015   Procedure: TOTAL KNEE ARTHROPLASTY;  Surgeon: Garald Balding, MD;  Location: [facility];  Service: Orthopedics;  Laterality: Right;  . UVULOPALATOPHARYNGOPLASTY (UPPP)/TONSILLECTOMY/SEPTOPLASTY  1990's  . VASECTOMY      There were no vitals filed for this visit.                    [facility] Adult PT Treatment/Exercise - 05/23/18 0001      Lumbar Exercises: Supine   Bridge  20 reps    Single Leg Bridge  20 reps    Straight Leg Raise  20 reps   2 sets with second set 2#    Other Supine Lumbar Exercises  all with abdominal draw in      Knee/Hip Exercises: Aerobic   Nustep  LE x 6  min Level 6       Knee/Hip Exercises: Machines for Strengthening   Cybex Knee Extension  35# bilat, 15# single leg left    Cybex Knee Flexion  45# bilateral 25# single x 15    Total Gym Leg Press  2,3,4 plates x 20 bilateral  Knee/Hip Exercises: Standing   Forward Step Up  20 reps;Step Height: 8";Hand Hold: 1;Left;Both    SLS  Right 15 sec, 30 sec left       Knee/Hip Exercises: Seated   Sit to Sand  20 reps;without UE support      Knee/Hip Exercises: Sidelying   Hip ABduction  20 reps;Right;Left   2#   Clams  green x 20each ,reverse 2# x 20 each      Ankle Exercises: Seated   Other Seated Ankle Exercises  isometric ankle inversion and eversion with ball 15 each                PT Short Term Goals - 04/11/18 0752      PT SHORT TERM GOAL #1   Title  He will be independent with iniital HEp    Time  4    Period  Weeks    Status  Achieved      PT SHORT TERM GOAL #2   Title  He will report greater ease with walking and lifting leg up to dry leg    Time  4    Period  Weeks    Status  Achieved      PT SHORT TERM GOAL #4   Title  Single leg balance improved to 5 sec RT and LT leg    Time  4    Period  Weeks    Status  Achieved        PT Long Term Goals - 05/17/18 0700      PT LONG TERM GOAL #1   Title  He will be independent wih all HEP issued     Status  On-going      PT LONG TERM GOAL #2   Title  He will have max 3/10 LBP with home tasks    Status  Achieved      PT LONG TERM GOAL #3   Title  He will have 3/10 max pain doing normal home tasks  without brace if allowed by MD.     Status  Achieved      PT LONG TERM GOAL #4   Title  He will be able to walk 2-3 mile with no incr pain.     Baseline  maybe 1/4 mile per pt . no problem doing this    Status  On-going      Additional Long Term Goals   Additional Long Term Goals  Yes      PT LONG TERM GOAL #6   Title  Hip and thigh strength all 4/5 or better to be able to lift and return to work and to protect back.     Time  4    Period  Weeks    Status  New            Plan - 05/23/18 2505    Clinical Impression Statement  Pt reports feeling stronger in LEs. Focused strength of LEs this session. Tolerates increased repetitions. May discontinue PT when jobs starts.     PT Next Visit Plan  Continue strength and balance.   gait , knee pain may be limiting factor.    PT Home Exercise Plan  tandem balance , heel raises, hip abduction stand, wall sit , hip ext stretch (runners) and adductor stretching in standing, Abdominal set with exhaling in various positions.     Consulted and Agree with Plan of Care  Patient       Patient will  benefit from skilled therapeutic intervention in order to improve the following deficits and impairments:  Decreased strength, Pain, Decreased balance  Visit Diagnosis: Muscle weakness (generalized)  Chronic bilateral low back pain without sciatica  Unsteadiness on feet     Problem List Patient Active Problem List   Diagnosis Date Noted  . Essential hypertension 02/16/2018  . Sepsis due to skin infection ([place]) 02/16/2018  . Chronic pain 02/16/2018  . Cellulitis of left lower extremity   . Lumbar radiculopathy, chronic 01/24/2018  . Lumbar stenosis with neurogenic claudication 03/23/2017  . Hypothyroidism 07/25/2015  . Primary  osteoarthritis of right knee 07/23/2015  . Primary osteoarthritis of knee 07/23/2015    Larry [name], [place] 05/23/2018, 9:27 AM  [facility] [address] [place], [place], [address] Phone: [phone]   Fax:  [phone]  Name: STEPFON [name] MRN: [id number] Date of Birth: May 31, 1951

## 2018-05-25 ENCOUNTER — Encounter: Payer: Self-pay | Admitting: Physical Therapy

## 2018-05-25 ENCOUNTER — Ambulatory Visit: Payer: Medicare Other | Admitting: Physical Therapy

## 2018-05-25 DIAGNOSIS — G8929 Other chronic pain: Secondary | ICD-10-CM

## 2018-05-25 DIAGNOSIS — M545 Low back pain: Secondary | ICD-10-CM | POA: Diagnosis not present

## 2018-05-25 DIAGNOSIS — R2681 Unsteadiness on feet: Secondary | ICD-10-CM | POA: Diagnosis not present

## 2018-05-25 DIAGNOSIS — M6281 Muscle weakness (generalized): Secondary | ICD-10-CM

## 2018-05-25 NOTE — Therapy (Signed)
[place], [place], [address] Phone: [phone]   Fax:  [phone]  Physical Therapy Treatment  Patient Details  Name: Larry [name] MRN: [id number] Date of Birth: May 02, 1951 Referring Provider: Kristeen Miss, MD   Encounter Date: 05/25/2018  PT End of Session - 05/25/18 0716    Visit Number  21    Number of Visits  27    Date for PT Re-Evaluation  06/17/18    Authorization Type  MCR     Authorization Time Period  Kx at visit 15  Progress visit 20    PT Start Time  0715    PT Stop Time  0800    PT Time Calculation (min)  45 min       Past Medical History:  Diagnosis Date  . Arthritis    "knees; left shoulder" (04/12/2013)  . Asthma    "as a child" (04/12/2013)  . DDD (degenerative disc disease), cervical   . DDD (degenerative disc disease), lumbar   . Difficult intubation    2012   CERVICAL FUSION   . Headache(784.0)    VERAPAMIL FOR PREVENTION   . 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 History of chicken pox    as an adult  . Hypoglycemia    last episode 1 yr. ago, just gets jittery  . Hypothyroidism   . Lumbar stenosis   . OSA (obstructive sleep apnea)    "haven't wore mask in years; had OR; still snore" (04/12/2013)  . Urinary frequency     Past Surgical History:  Procedure Laterality Date  . BACK SURGERY    . CERVICAL FUSION  2002; 2012  . CYSTECTOMY    . INGUINAL HERNIA REPAIR Bilateral 1997  . KNEE ARTHROSCOPY Left ~ 1997; ~ 2005  . LUMBAR FUSION  2007; 04/11/2013  . PENILE DEBRIDEMENT  ~ 2011   X 3  FOR MRSA INFECTION    . SHOULDER ARTHROSCOPY Left ~ 2003  . TONSILLECTOMY  1990's  . TOTAL KNEE ARTHROPLASTY Right 07/23/2015  . TOTAL KNEE ARTHROPLASTY Right 07/23/2015   Procedure: TOTAL KNEE ARTHROPLASTY;  Surgeon: Garald Balding, MD;  Location: [facility];  Service: Orthopedics;   Laterality: Right;  . UVULOPALATOPHARYNGOPLASTY (UPPP)/TONSILLECTOMY/SEPTOPLASTY  1990's  . VASECTOMY      There were no vitals filed for this visit.                    [facility] Adult PT Treatment/Exercise - 05/25/18 0001      Knee/Hip Exercises: Aerobic   Nustep  LE x 6  min Level 6       Knee/Hip Exercises: Machines for Strengthening   Cybex Knee Extension  35# bilat, 15# single leg left    Cybex Knee Flexion  45# bilateral 20# single x 15    Total Gym Leg Press  3, 4 plates       Knee/Hip Exercises: Standing   Forward Step Up  20 reps;Step Height: 8";Hand Hold: 1;Left;Both    Other Standing Knee Exercises  tandem gait, tandem stance with head turns , SLS , marching on foam, narrow stance trunk rotations on foam       Knee/Hip Exercises: Seated   Sit to Sand  20 reps;without UE support      Knee/Hip Exercises: Sidelying   Hip ABduction  20 reps;Right;Left  2#   Clams  green x 20each ,reverse 2# x 20 each               PT Short Term Goals - 04/11/18 4098      PT SHORT TERM GOAL #1   Title  He will be independent with iniital HEp    Time  4    Period  Weeks    Status  Achieved      PT SHORT TERM GOAL #2   Title  He will report greater ease with walking and lifting leg up to dry leg    Time  4    Period  Weeks    Status  Achieved      PT SHORT TERM GOAL #4   Title  Single leg balance improved to 5 sec RT and LT leg    Time  4    Period  Weeks    Status  Achieved        PT Long Term Goals - 05/17/18 0700      PT LONG TERM GOAL #1   Title  He will be independent wih all HEP issued    Status  On-going      PT LONG TERM GOAL #2   Title  He will have max 3/10 LBP with home tasks    Status  Achieved      PT LONG TERM GOAL #3   Title  He will have 3/10 max pain doing normal home tasks  without brace if allowed by MD.     Status  Achieved      PT LONG TERM GOAL #4   Title  He will be able to walk 2-3 mile with no incr pain.      Baseline  maybe 1/4 mile per pt . no problem doing this    Status  On-going      Additional Long Term Goals   Additional Long Term Goals  Yes      PT LONG TERM GOAL #6   Title  Hip and thigh strength all 4/5 or better to be able to lift and return to work and to protect back.     Time  4    Period  Weeks    Status  New            Plan - 05/25/18 0857    Clinical Impression Statement  Pt reports feeling less hesitaton going down the stairs. He is awaiting new inserts for shoes. He walked some yesterday in loafers and without braces and felt better. He reports his sneakers are too big and make him walk differently. Progressing toward strength goals.     PT Next Visit Plan  Continue strength and balance.   gait , knee pain may be limiting factor.    PT Home Exercise Plan  tandem balance , heel raises, hip abduction stand, wall sit , hip ext stretch (runners) and adductor stretching in standing, Abdominal set with exhaling in various positions.     Consulted and Agree with Plan of Care  Patient       Patient will benefit from skilled therapeutic intervention in order to improve the following deficits and impairments:  Decreased strength, Pain, Decreased balance  Visit Diagnosis: Muscle weakness (generalized)  Chronic bilateral low back pain without sciatica  Unsteadiness on feet     Problem List Patient Active Problem List   Diagnosis Date Noted  . Essential hypertension 02/16/2018  . Sepsis due to skin  infection ([place]) 02/16/2018  . Chronic pain 02/16/2018  . Cellulitis of left lower extremity   . Lumbar radiculopathy, chronic 01/24/2018  . Lumbar stenosis with neurogenic claudication 03/23/2017  . Hypothyroidism 07/25/2015  . Primary osteoarthritis of right knee 07/23/2015  . Primary osteoarthritis of knee 07/23/2015    Dorene Ar, [place] 05/25/2018, 9:02 AM  [facility] [address] [place], [place],  [address] Phone: [phone]   Fax:  [phone]  Name: Larry [name] MRN: [id number] Date of Birth: 1950/11/11

## 2018-05-27 ENCOUNTER — Encounter

## 2018-05-30 ENCOUNTER — Ambulatory Visit (HOSPITAL_COMMUNITY)
Admission: EM | Admit: 2018-05-30 | Discharge: 2018-05-30 | Disposition: A | Discharge status: Home / Self Care | Payer: Medicare Other | Attending: Physician Assistant | Admitting: Physician Assistant

## 2018-05-30 ENCOUNTER — Encounter (HOSPITAL_COMMUNITY): Payer: Self-pay

## 2018-05-30 DIAGNOSIS — L739 Follicular disorder, unspecified: Secondary | ICD-10-CM

## 2018-05-30 DIAGNOSIS — L080 Pyoderma: Secondary | ICD-10-CM

## 2018-05-30 MED ORDER — DOXYCYCLINE HYCLATE 100 MG PO CAPS: 100.0000 mg | ORAL_CAPSULE | Freq: Two times a day (BID) | ORAL | 0 refills | Status: AC

## 2018-05-30 NOTE — ED Triage Notes (Signed)
Pt presents with allergic reaction; could possibly be to medication increase.

## 2018-05-30 NOTE — ED Provider Notes (Signed)
05/30/2018 8:36 PM   DOB: 01-Nov-1950 / MRN: [id number]  SUBJECTIVE:  Larry [name] is a 67 y.o. male presenting for papular rash that started on his chest about 4-5 days ago and is worsening.  Assoicates no pain or significant itching.  Denies fever.  Has tried nothing.   He has No Known Allergies.   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    He  reports that he quit smoking about 31 years ago. His smoking use included cigarettes. He has a 7.50 pack-year smoking history. He has never used smokeless tobacco. He reports that he does not drink alcohol or use drugs. He  reports that he currently engages in sexual activity. The patient  has a past surgical history that includes Penile debridement (~ 2011); Knee arthroscopy (Left, ~ 1997; ~ 2005); Shoulder arthroscopy (Left, ~ 2003); Uvulopalatopharyngoplasty (uppp)/tonsillectomy/septoplasty (1990's); Cervical fusion (2002; 2012); Lumbar fusion (2007; 04/11/2013); Tonsillectomy (1990's); Back surgery; Inguinal hernia repair (Bilateral, 1997); Cystectomy; Vasectomy; Total knee arthroplasty (Right, 07/23/2015); and Total knee arthroplasty (Right, 07/23/2015).  His family history is not on file.  Review of Systems  Constitutional: Negative for chills, diaphoresis and fever.  Eyes: Negative.   Respiratory: Negative for cough, hemoptysis, sputum production, shortness of breath and wheezing.   Cardiovascular: Negative for chest pain, orthopnea and leg swelling.  Gastrointestinal: Negative for abdominal pain, blood in stool, constipation, diarrhea, heartburn, melena, nausea and vomiting.  Genitourinary: Negative for flank pain.  Skin: Positive  for rash. Negative for itching.  Neurological: Negative for dizziness, sensory change, speech change, focal weakness and headaches.    OBJECTIVE:  BP (!) 165/99 (BP Location: Left Arm)   Pulse (!) 59   Temp 97.8 F (36.6 C) (Oral)   Resp 16   SpO2 96%   Wt Readings from Last 3 Encounters:  02/17/18 198 lb (89.8 kg)  01/24/18 193 lb (87.5 kg)  01/17/18 193 lb (87.5 kg)   Temp Readings from Last 3 Encounters:  05/30/18 97.8 F (36.6 C) (Oral)  02/23/18 97.7 F (36.5 C) (Oral)  01/25/18 98.3 F (36.8 C) (Oral)   BP Readings from Last 3 Encounters:  05/30/18 (!) 165/99  02/23/18 (!) 160/73  01/25/18 124/70   Pulse Readings from Last 3 Encounters:  05/30/18 (!) 59  02/23/18 (!) 57  01/25/18 84    Physical Exam  Constitutional: He is oriented to person, place, and time. He appears well-developed. He does not appear ill.  Eyes: Pupils are equal, round, and reactive to light. Conjunctivae and EOM are normal.  Cardiovascular: Normal rate.  Pulmonary/Chest: Effort normal.    Abdominal: He exhibits no distension.  Musculoskeletal: Normal range of motion.  Neurological: He is alert and oriented to person, place, and time. No cranial nerve deficit. Coordination normal.  Skin: Skin is warm and dry. He is not diaphoretic.  Psychiatric: He has a normal mood and affect.  Nursing note and vitals reviewed.   No results found for this or any previous visit (from the past 72 hour(s)).  No results found.  ASSESSMENT AND PLAN:   Pustular rash  Folliculitis    Discharge Instructions     You must wear sunscreen while taking doxycycline, please.         The patient is advised to  call or return to clinic if he does not see an improvement in symptoms, or to seek the care of the closest emergency department if he worsens with the above plan.   Philis Fendt, MHS, PA-C 05/30/2018 8:36 PM   Tereasa Coop, PA-C 05/30/18 2036

## 2018-05-30 NOTE — Discharge Instructions (Signed)
You must wear sunscreen while taking doxycycline, please.

## 2018-05-31 ENCOUNTER — Ambulatory Visit: Payer: Medicare Other | Attending: Internal Medicine

## 2018-05-31 DIAGNOSIS — M545 Low back pain, unspecified: Secondary | ICD-10-CM

## 2018-05-31 DIAGNOSIS — M6281 Muscle weakness (generalized): Secondary | ICD-10-CM | POA: Insufficient documentation

## 2018-05-31 DIAGNOSIS — R2681 Unsteadiness on feet: Secondary | ICD-10-CM | POA: Diagnosis not present

## 2018-05-31 DIAGNOSIS — G8929 Other chronic pain: Secondary | ICD-10-CM | POA: Insufficient documentation

## 2018-05-31 NOTE — Therapy (Signed)
[place], [place], [address] Phone: [phone]   Fax:  [phone]  Physical Therapy Treatment  Patient Details  Name: Larry [name] MRN: [id number] Date of Birth: [birth year] Referring Provider: Kristeen Miss, MD   Encounter Date: 05/31/2018  PT End of Session - 05/31/18 0659    Visit Number  22    Number of Visits  27    Date for PT Re-Evaluation  06/17/18    Authorization Type  MCR     Authorization Time Period  Kx at visit 15  Progress visit 20    PT Start Time  0700    PT Stop Time  0745    PT Time Calculation (min)  45 min    Activity Tolerance  Patient tolerated treatment well    Behavior During Therapy  [facility] for tasks assessed/performed       Past Medical History:  Diagnosis Date  . Arthritis    "knees; left shoulder" (04/12/2013)  . Asthma    "as a child" (04/12/2013)  . DDD (degenerative disc disease), cervical   . DDD (degenerative disc disease), lumbar   . Difficult intubation    2012   CERVICAL FUSION   . Headache(784.0)    VERAPAMIL FOR PREVENTION   . 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 History of chicken pox    as an adult  . Hypoglycemia    last episode 1 yr. ago, just gets jittery  . Hypothyroidism   . Lumbar stenosis   . OSA (obstructive sleep apnea)    "haven't wore mask in years; had OR; still snore" (04/12/2013)  . Urinary frequency     Past Surgical History:  Procedure Laterality Date  . BACK SURGERY    . CERVICAL FUSION  2002; 2012  . CYSTECTOMY    . INGUINAL HERNIA REPAIR Bilateral 1997  . KNEE ARTHROSCOPY Left ~ 1997; ~ 2005  . LUMBAR FUSION  2007; 04/11/2013  . PENILE DEBRIDEMENT  ~ 2011   X 3  FOR MRSA INFECTION    . SHOULDER ARTHROSCOPY Left ~ 2003  . TONSILLECTOMY  1990's  . TOTAL KNEE ARTHROPLASTY Right 07/23/2015  . TOTAL KNEE ARTHROPLASTY Right 07/23/2015    Procedure: TOTAL KNEE ARTHROPLASTY;  Surgeon: Garald Balding, MD;  Location: [facility];  Service: Orthopedics;  Laterality: Right;  . UVULOPALATOPHARYNGOPLASTY (UPPP)/TONSILLECTOMY/SEPTOPLASTY  1990's  . VASECTOMY      There were no vitals filed for this visit.  Subjective Assessment - 05/31/18 0704    Subjective  Stopped gabapentin. On feet more now with work.     Currently in Pain?  No/denies                       [facility] Adult PT Treatment/Exercise - 05/31/18 0001      Neuro Re-ed    Neuro Re-ed Details   balance on trampoline tandem alt RT /Lt in front and side step RT/Lt  , heel /toe raises,  march all 20-25 reps RT/Lt       Lumbar Exercises: Supine   Bridge  20 reps    Single Leg Bridge  20 reps    Straight Leg Raise  --   3 pounds 2x10   Other Supine Lumbar Exercises  all with abdominal draw in      Knee/Hip Exercises: Aerobic  Nustep  LE x 6  min Level 6       Knee/Hip Exercises: Machines for Strengthening   Cybex Knee Extension  35# bilat, 15# single leg left/right    Cybex Knee Flexion  55# bilateral 20#  single     Total Gym Leg Press  4 plates x15 2 sets      Knee/Hip Exercises: Seated   Sit to Sand  20 reps;without UE support      Knee/Hip Exercises: Sidelying   Hip ABduction  20 reps;Right;Left   3 pounds   Clams  blue band x 20               PT Short Term Goals - 04/11/18 7616      PT SHORT TERM GOAL #1   Title  He will be independent with iniital HEp    Time  4    Period  Weeks    Status  Achieved      PT SHORT TERM GOAL #2   Title  He will report greater ease with walking and lifting leg up to dry leg    Time  4    Period  Weeks    Status  Achieved      PT SHORT TERM GOAL #4   Title  Single leg balance improved to 5 sec RT and LT leg    Time  4    Period  Weeks    Status  Achieved        PT Long Term Goals - 05/31/18 0747      PT LONG TERM GOAL #1   Title  He will be independent wih all HEP issued      PT  LONG TERM GOAL #2   Title  He will have max 3/10 LBP with home tasks    Baseline  no back pain    Status  Achieved      PT LONG TERM GOAL #3   Title  He will have 3/10 max pain doing normal home tasks  without brace if allowed by MD.     Baseline  no  back pain     Status  Achieved      PT LONG TERM GOAL #4   Title  He will be able to walk 2-3 mile with no incr pain.     Status  On-going      PT LONG TERM GOAL #5   Title  FOTO score will improve to 42% limited to demo incr functional activity    Status  Achieved      PT LONG TERM GOAL #6   Title  Hip and thigh strength all 4/5 or better to be able to lift and return to work and to protect back.     Status  On-going            Plan - 05/31/18 0700    Clinical Impression Statement  No pain post . tolerated increased weight .  Balance on trampoline was challenging.  Will discharge at end of POC    PT Treatment/Interventions  Electrical Stimulation;Moist Heat;Therapeutic exercise;Patient/family education;Manual techniques;Dry needling    PT Next Visit Plan  Continue strength and balance.   gait , knee pain may be limiting factor.    PT Home Exercise Plan  tandem balance , heel raises, hip abduction stand, wall sit , hip ext stretch (runners) and adductor stretching in standing, Abdominal set with exhaling in various positions.     Consulted and  Agree with Plan of Care  Patient       Patient will benefit from skilled therapeutic intervention in order to improve the following deficits and impairments:  Decreased strength, Pain, Decreased balance  Visit Diagnosis: Muscle weakness (generalized)  Chronic bilateral low back pain without sciatica  Unsteadiness on feet     Problem List Patient Active Problem List   Diagnosis Date Noted  . Essential hypertension 02/16/2018  . Sepsis due to skin infection ([place]) 02/16/2018  . Chronic pain 02/16/2018  . Cellulitis of left lower extremity   . Lumbar radiculopathy, chronic  01/24/2018  . Lumbar stenosis with neurogenic claudication 03/23/2017  . Hypothyroidism 07/25/2015  . Primary osteoarthritis of right knee 07/23/2015  . Primary osteoarthritis of knee 07/23/2015    Darrel Hoover  PT 05/31/2018, 7:48 AM  [facility] [address] [place], [place], [address] Phone: [phone]   Fax:  [phone]  Name: Larry [name] MRN: [id number] Date of Birth: Feb 03, 1951

## 2018-06-02 ENCOUNTER — Ambulatory Visit: Payer: Medicare Other

## 2018-06-02 DIAGNOSIS — G8929 Other chronic pain: Secondary | ICD-10-CM | POA: Diagnosis not present

## 2018-06-02 DIAGNOSIS — M6281 Muscle weakness (generalized): Secondary | ICD-10-CM | POA: Diagnosis not present

## 2018-06-02 DIAGNOSIS — R972 Elevated prostate specific antigen [PSA]: Secondary | ICD-10-CM | POA: Diagnosis not present

## 2018-06-02 DIAGNOSIS — R413 Other amnesia: Secondary | ICD-10-CM | POA: Diagnosis not present

## 2018-06-02 DIAGNOSIS — Z5181 Encounter for therapeutic drug level monitoring: Secondary | ICD-10-CM | POA: Diagnosis not present

## 2018-06-02 DIAGNOSIS — M545 Low back pain: Secondary | ICD-10-CM | POA: Diagnosis not present

## 2018-06-02 DIAGNOSIS — R2681 Unsteadiness on feet: Secondary | ICD-10-CM

## 2018-06-02 DIAGNOSIS — E039 Hypothyroidism, unspecified: Secondary | ICD-10-CM | POA: Diagnosis not present

## 2018-06-02 DIAGNOSIS — I10 Essential (primary) hypertension: Secondary | ICD-10-CM | POA: Diagnosis not present

## 2018-06-02 NOTE — Therapy (Signed)
[place], [place], [address] Phone: [phone]   Fax:  [phone]  Physical Therapy Treatment  Patient Details  Name: Larry [name] MRN: [id number] Date of Birth: Sep 05, 1951 Referring Provider: Kristeen Miss, MD   Encounter Date: 06/02/2018  PT End of Session - 06/02/18 0658    Visit Number  23    Number of Visits  27    Date for PT Re-Evaluation  06/17/18    Authorization Type  MCR     Authorization Time Period  Kx at visit 15  Progress visit 70    PT Start Time  0655    PT Stop Time  0740    PT Time Calculation (min)  45 min    Activity Tolerance  Patient tolerated treatment well    Behavior During Therapy  [facility] for tasks assessed/performed       Past Medical History:  Diagnosis Date  . Arthritis    "knees; left shoulder" (04/12/2013)  . Asthma    "as a child" (04/12/2013)  . DDD (degenerative disc disease), cervical   . DDD (degenerative disc disease), lumbar   . Difficult intubation    2012   CERVICAL FUSION   . Headache(784.0)    VERAPAMIL FOR PREVENTION   . 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 History of chicken pox    as an adult  . Hypoglycemia    last episode 1 yr. ago, just gets jittery  . Hypothyroidism   . Lumbar stenosis   . OSA (obstructive sleep apnea)    "haven't wore mask in years; had OR; still snore" (04/12/2013)  . Urinary frequency     Past Surgical History:  Procedure Laterality Date  . BACK SURGERY    . CERVICAL FUSION  2002; 2012  . CYSTECTOMY    . INGUINAL HERNIA REPAIR Bilateral 1997  . KNEE ARTHROSCOPY Left ~ 1997; ~ 2005  . LUMBAR FUSION  2007; 04/11/2013  . PENILE DEBRIDEMENT  ~ 2011   X 3  FOR MRSA INFECTION    . SHOULDER ARTHROSCOPY Left ~ 2003  . TONSILLECTOMY  1990's  . TOTAL KNEE ARTHROPLASTY Right 07/23/2015  . TOTAL KNEE ARTHROPLASTY Right 07/23/2015    Procedure: TOTAL KNEE ARTHROPLASTY;  Surgeon: Garald Balding, MD;  Location: [facility];  Service: Orthopedics;  Laterality: Right;  . UVULOPALATOPHARYNGOPLASTY (UPPP)/TONSILLECTOMY/SEPTOPLASTY  1990's  . VASECTOMY      There were no vitals filed for this visit.  Subjective Assessment - 06/02/18 0710    Subjective  No pain . Tired from being up late . Getting inserts today.    Currently in Pain?  No/denies                       [facility] Adult PT Treatment/Exercise - 06/02/18 0001      Therapeutic Activites    Therapeutic Activities  Work [employer]    Work Simulation  push and pull work sled 50 feet x 4 each no extra load.       Neuro Re-ed    Neuro Re-ed Details   trampoline stepping sideway and forward x 15 each      Knee/Hip Exercises: Aerobic   Nustep  LE x 6  min Level 6       Knee/Hip Exercises: Machines for Strengthening   Cybex Knee Extension  45#  x 15 then Rt/ Lt  15# 2x10    Cybex Knee Flexion  55# x 20    Total Gym Leg Press  4-5-6 plates x 15      Knee/Hip Exercises: Standing   Lateral Step Up  Right    Lateral Step Up Limitations  LT x 10 then stopped due to Lt knee pain then x 15 RT  step up on trampoline    Other Standing Knee Exercises  Core strength and stability with blue  band x 15    Other Standing Knee Exercises  side steps with band around knees green x 20 reps RT/LT               PT Short Term Goals - 04/11/18 2694      PT SHORT TERM GOAL #1   Title  He will be independent with iniital HEp    Time  4    Period  Weeks    Status  Achieved      PT SHORT TERM GOAL #2   Title  He will report greater ease with walking and lifting leg up to dry leg    Time  4    Period  Weeks    Status  Achieved      PT SHORT TERM GOAL #4   Title  Single leg balance improved to 5 sec RT and LT leg    Time  4    Period  Weeks    Status  Achieved        PT Long Term Goals - 06/02/18 0741      PT LONG TERM GOAL #1   Title  He will be  independent wih all HEP issued    Status  On-going      PT LONG TERM GOAL #2   Title  He will have max 3/10 LBP with home tasks    Status  Achieved      PT LONG TERM GOAL #3   Title  He will have 3/10 max pain doing normal home tasks  without brace if allowed by MD.     Status  Achieved      PT LONG TERM GOAL #4   Title  He will be able to walk 2-3 mile with no incr pain.     Baseline  not doing distances walking as now working 5x/week full time    Status  On-going      PT LONG TERM GOAL #5   Title  FOTO score will improve to 42% limited to demo incr functional activity    Status  Achieved      PT LONG TERM GOAL #6   Title  Hip and thigh strength all 4/5 or better to be able to lift and return to work and to protect back.     Status  Unable to assess            Plan - 06/02/18 0659    Clinical Impression Statement  Worrked push pull and walk backward with hold for balance and strength in LE.  Continue x 4 more visits then discharge with HEP. He is functional now and only needs ankle stability from external support  to decr risk of inversion sprain RT.     PT Treatment/Interventions  Electrical Stimulation;Moist Heat;Therapeutic exercise;Patient/family education;Manual techniques;Dry needling    PT Next Visit Plan  Continue strength and balance.   gait , knee pain may be limiting factor.  Add band no trunk movement  but cross body pulls and diagonals   MMT    PT Home Exercise Plan  tandem balance , heel raises, hip abduction stand, wall sit , hip ext stretch (runners) and adductor stretching in standing, Abdominal set with exhaling in various positions.     Consulted and Agree with Plan of Care  Patient       Patient will benefit from skilled therapeutic intervention in order to improve the following deficits and impairments:  Decreased strength, Pain, Decreased balance  Visit Diagnosis: Muscle weakness (generalized)  Chronic bilateral low back pain without  sciatica  Unsteadiness on feet     Problem List Patient Active Problem List   Diagnosis Date Noted  . Essential hypertension 02/16/2018  . Sepsis due to skin infection ([place]) 02/16/2018  . Chronic pain 02/16/2018  . Cellulitis of left lower extremity   . Lumbar radiculopathy, chronic 01/24/2018  . Lumbar stenosis with neurogenic claudication 03/23/2017  . Hypothyroidism 07/25/2015  . Primary osteoarthritis of right knee 07/23/2015  . Primary osteoarthritis of knee 07/23/2015    Darrel Hoover  PT 06/02/2018, 7:43 AM  [facility] [address] [place], [place], [address] Phone: [phone]   Fax:  [phone]  Name: ARVIE BARTHOLOMEW MRN: [id number] Date of Birth: 10/06/1950

## 2018-06-06 ENCOUNTER — Ambulatory Visit: Payer: Medicare Other

## 2018-06-06 DIAGNOSIS — R2681 Unsteadiness on feet: Secondary | ICD-10-CM | POA: Diagnosis not present

## 2018-06-06 DIAGNOSIS — M545 Low back pain, unspecified: Secondary | ICD-10-CM

## 2018-06-06 DIAGNOSIS — M6281 Muscle weakness (generalized): Secondary | ICD-10-CM

## 2018-06-06 DIAGNOSIS — G8929 Other chronic pain: Secondary | ICD-10-CM | POA: Diagnosis not present

## 2018-06-06 NOTE — Therapy (Signed)
[place], [place], [address] Phone: [phone]   Fax:  [phone]  Physical Therapy Treatment  Patient Details  Name: Larry [name] MRN: [id number] Date of Birth: 11-Jul-1951 Referring Provider: Kristeen Miss, MD   Encounter Date: 06/06/2018  PT End of Session - 06/06/18 0719    Visit Number  24    Number of Visits  27    Date for PT Re-Evaluation  06/17/18    Authorization Type  MCR     Authorization Time Period  Kx at visit 15  Progress visit 20    PT Start Time  0700    PT Stop Time  0745    PT Time Calculation (min)  45 min    Activity Tolerance  Patient tolerated treatment well    Behavior During Therapy  [facility] for tasks assessed/performed       Past Medical History:  Diagnosis Date  . Arthritis    "knees; left shoulder" (04/12/2013)  . Asthma    "as a child" (04/12/2013)  . DDD (degenerative disc disease), cervical   . DDD (degenerative disc disease), lumbar   . Difficult intubation    2012   CERVICAL FUSION   . Headache(784.0)    VERAPAMIL FOR PREVENTION   . 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 History of chicken pox    as an adult  . Hypoglycemia    last episode 1 yr. ago, just gets jittery  . Hypothyroidism   . Lumbar stenosis   . OSA (obstructive sleep apnea)    "haven't wore mask in years; had OR; still snore" (04/12/2013)  . Urinary frequency     Past Surgical History:  Procedure Laterality Date  . BACK SURGERY    . CERVICAL FUSION  2002; 2012  . CYSTECTOMY    . INGUINAL HERNIA REPAIR Bilateral 1997  . KNEE ARTHROSCOPY Left ~ 1997; ~ 2005  . LUMBAR FUSION  2007; 04/11/2013  . PENILE DEBRIDEMENT  ~ 2011   X 3  FOR MRSA INFECTION    . SHOULDER ARTHROSCOPY Left ~ 2003  . TONSILLECTOMY  1990's  . TOTAL KNEE ARTHROPLASTY Right 07/23/2015  . TOTAL KNEE ARTHROPLASTY Right 07/23/2015    Procedure: TOTAL KNEE ARTHROPLASTY;  Surgeon: Garald Balding, MD;  Location: [place];  Service: Orthopedics;  Laterality: Right;  . UVULOPALATOPHARYNGOPLASTY (UPPP)/TONSILLECTOMY/SEPTOPLASTY  1990's  . VASECTOMY      There were no vitals filed for this visit.  Subjective Assessment - 06/06/18 0719    Subjective  No pain    Currently in Pain?  No/denies                       [facility] Adult PT Treatment/Exercise - 06/06/18 0001      Knee/Hip Exercises: Aerobic   Nustep  LE x 6  min Level 6       Knee/Hip Exercises: Machines for Strengthening   Total Gym Leg Press  44 plates 3x10      Knee/Hip Exercises: Standing   Other Standing Knee Exercises  Core strength and stability with blue  band x 15 chop ,cross chest pulls and shoulder ext x 15-20 reps  then bicep curls x 15 10 pounds    Other Standing Knee Exercises  forward and RT/LT side steps 5 reps with 4 plates on wall pullys  Knee/Hip Exercises: Supine   Bridges  Both;20 reps    Straight Leg Raises  Right;Left;20 reps    Other Supine Knee/Hip Exercises  LTR x 20 legs on ball  then lift ball x 15 with both legs              PT Education - 06/06/18 0743    Education Details  issued blue  band  for core stability and balance at home ( has green band)    Person(s) Educated  Patient    Methods  Explanation    Comprehension  Verbalized understanding       PT Short Term Goals - 04/11/18 8341      PT SHORT TERM GOAL #1   Title  He will be independent with iniital HEp    Time  4    Period  Weeks    Status  Achieved      PT SHORT TERM GOAL #2   Title  He will report greater ease with walking and lifting leg up to dry leg    Time  4    Period  Weeks    Status  Achieved      PT SHORT TERM GOAL #4   Title  Single leg balance improved to 5 sec RT and LT leg    Time  4    Period  Weeks    Status  Achieved        PT Long Term Goals - 06/02/18 0741      PT LONG TERM GOAL #1   Title  He will be  independent wih all HEP issued    Status  On-going      PT LONG TERM GOAL #2   Title  He will have max 3/10 LBP with home tasks    Status  Achieved      PT LONG TERM GOAL #3   Title  He will have 3/10 max pain doing normal home tasks  without brace if allowed by MD.     Status  Achieved      PT LONG TERM GOAL #4   Title  He will be able to walk 2-3 mile with no incr pain.     Baseline  not doing distances walking as now working 5x/week full time    Status  On-going      PT LONG TERM GOAL #5   Title  FOTO score will improve to 42% limited to demo incr functional activity    Status  Achieved      PT LONG TERM GOAL #6   Title  Hip and thigh strength all 4/5 or better to be able to lift and return to work and to protect back.     Status  Unable to assess            Plan - 06/06/18 0720    Clinical Impression Statement  Doing well . using gym system at home and dont HEP. Continue with some baance issued but for normal activity he is fine. Lateral RT ankle weakenes an dLt knee OA create some balance and activity risk but he is independent and is working full time . Plan discharge end POC    PT Treatment/Interventions  Electrical Stimulation;Moist Heat;Therapeutic exercise;Patient/family education;Manual techniques;Dry needling    PT Next Visit Plan  Continue strength and balance.   gait , knee pain may be limiting factor.  Add band no trunk movement but cross body pulls and diagonals   MMT  PT Home Exercise Plan  tandem balance , heel raises, hip abduction stand, wall sit , hip ext stretch (runners) and adductor stretching in standing, Abdominal set with exhaling in various positions.     Consulted and Agree with Plan of Care  Patient       Patient will benefit from skilled therapeutic intervention in order to improve the following deficits and impairments:  Decreased strength, Pain, Decreased balance  Visit Diagnosis: Muscle weakness (generalized)  Chronic bilateral low back  pain without sciatica  Unsteadiness on feet     Problem List Patient Active Problem List   Diagnosis Date Noted  . Essential hypertension 02/16/2018  . Sepsis due to skin infection ([place]) 02/16/2018  . Chronic pain 02/16/2018  . Cellulitis of left lower extremity   . Lumbar radiculopathy, chronic 01/24/2018  . Lumbar stenosis with neurogenic claudication 03/23/2017  . Hypothyroidism 07/25/2015  . Primary osteoarthritis of right knee 07/23/2015  . Primary osteoarthritis of knee 07/23/2015    Darrel Hoover  PT 06/06/2018, 8:15 AM  [facility] [address] [place], [place], [address] Phone: [phone]   Fax:  [phone]  Name: Larry [name] MRN: [id number] Date of Birth: [birth year]

## 2018-06-07 DIAGNOSIS — M79671 Pain in right foot: Secondary | ICD-10-CM | POA: Diagnosis not present

## 2018-06-07 DIAGNOSIS — R202 Paresthesia of skin: Secondary | ICD-10-CM | POA: Diagnosis not present

## 2018-06-07 DIAGNOSIS — M545 Low back pain: Secondary | ICD-10-CM | POA: Diagnosis not present

## 2018-06-07 DIAGNOSIS — R201 Hypoesthesia of skin: Secondary | ICD-10-CM | POA: Diagnosis not present

## 2018-06-08 ENCOUNTER — Ambulatory Visit: Payer: Medicare Other

## 2018-06-08 DIAGNOSIS — G8929 Other chronic pain: Secondary | ICD-10-CM | POA: Diagnosis not present

## 2018-06-08 DIAGNOSIS — I10 Essential (primary) hypertension: Secondary | ICD-10-CM | POA: Diagnosis not present

## 2018-06-08 DIAGNOSIS — Z79899 Other long term (current) drug therapy: Secondary | ICD-10-CM | POA: Diagnosis not present

## 2018-06-08 DIAGNOSIS — R21 Rash and other nonspecific skin eruption: Secondary | ICD-10-CM | POA: Diagnosis not present

## 2018-06-08 DIAGNOSIS — M6281 Muscle weakness (generalized): Secondary | ICD-10-CM

## 2018-06-08 DIAGNOSIS — L709 Acne, unspecified: Secondary | ICD-10-CM | POA: Diagnosis not present

## 2018-06-08 DIAGNOSIS — M545 Low back pain: Secondary | ICD-10-CM

## 2018-06-08 DIAGNOSIS — E291 Testicular hypofunction: Secondary | ICD-10-CM | POA: Diagnosis not present

## 2018-06-08 DIAGNOSIS — R2681 Unsteadiness on feet: Secondary | ICD-10-CM | POA: Diagnosis not present

## 2018-06-08 DIAGNOSIS — E039 Hypothyroidism, unspecified: Secondary | ICD-10-CM | POA: Diagnosis not present

## 2018-06-08 NOTE — Therapy (Signed)
[place], [place], [address] Phone: [phone]   Fax:  [phone]  Physical Therapy Treatment  Patient Details  Name: Larry [name] MRN: [id number] Date of Birth: Jan 14, 1951 Referring Provider: Kristeen Miss, MD   Encounter Date: 06/08/2018  PT End of Session - 06/08/18 0702    Visit Number  25    Number of Visits  27    Date for PT Re-Evaluation  06/17/18    Authorization Type  MCR     Authorization Time Period  Kx at visit 15  Progress visit 20    PT Start Time  0700    PT Stop Time  0745    PT Time Calculation (min)  45 min    Activity Tolerance  Patient tolerated treatment well    Behavior During Therapy  [facility] for tasks assessed/performed       Past Medical History:  Diagnosis Date  . Arthritis    "knees; left shoulder" (04/12/2013)  . Asthma    "as a child" (04/12/2013)  . DDD (degenerative disc disease), cervical   . DDD (degenerative disc disease), lumbar   . Difficult intubation    2012   CERVICAL FUSION   . Headache(784.0)    VERAPAMIL FOR PREVENTION   . 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 History of chicken pox    as an adult  . Hypoglycemia    last episode 1 yr. ago, just gets jittery  . Hypothyroidism   . Lumbar stenosis   . OSA (obstructive sleep apnea)    "haven't wore mask in years; had OR; still snore" (04/12/2013)  . Urinary frequency     Past Surgical History:  Procedure Laterality Date  . BACK SURGERY    . CERVICAL FUSION  2002; 2012  . CYSTECTOMY    . INGUINAL HERNIA REPAIR Bilateral 1997  . KNEE ARTHROSCOPY Left ~ 1997; ~ 2005  . LUMBAR FUSION  2007; 04/11/2013  . PENILE DEBRIDEMENT  ~ 2011   X 3  FOR MRSA INFECTION    . SHOULDER ARTHROSCOPY Left ~ 2003  . TONSILLECTOMY  1990's  . TOTAL KNEE ARTHROPLASTY Right 07/23/2015  . TOTAL KNEE ARTHROPLASTY Right 07/23/2015    Procedure: TOTAL KNEE ARTHROPLASTY;  Surgeon: Garald Balding, MD;  Location: [place];  Service: Orthopedics;  Laterality: Right;  . UVULOPALATOPHARYNGOPLASTY (UPPP)/TONSILLECTOMY/SEPTOPLASTY  1990's  . VASECTOMY      There were no vitals filed for this visit.  Subjective Assessment - 06/08/18 0703    Subjective  Trigger point injections yesterdsay.     Currently in Pain?  No/denies         [facility] PT Assessment - 06/08/18 0001      Standardized Balance Assessment   Standardized Balance Assessment  Berg Balance Test      Berg Balance Test   Sit to Stand  Able to stand without using hands and stabilize independently    Standing Unsupported  Able to stand safely 2 minutes    Sitting with Back Unsupported but Feet Supported on Floor or Stool  Able to sit safely and securely 2 minutes    Stand to Sit  Sits safely with minimal use of hands    Transfers  Able to transfer safely, minor use of hands    Standing Unsupported with Eyes Closed  Able to stand 10 seconds safely  Standing Ubsupported with Feet Together  Able to place feet together independently and stand 1 minute safely    From Standing, Reach Forward with Outstretched Arm  Can reach confidently >25 cm (10")    From Standing Position, Pick up Object from Floor  Able to pick up shoe safely and easily    From Standing Position, Turn to Look Behind Over each Shoulder  Looks behind from both sides and weight shifts well    Turn 360 Degrees  Able to turn 360 degrees safely in 4 seconds or less    Standing Unsupported, Alternately Place Feet on Step/Stool  Able to stand independently and safely and complete 8 steps in 20 seconds    Standing Unsupported, One Foot in Front  Able to plae foot ahead of the other independently and hold 30 seconds    Standing on One Leg  Able to lift leg independently and hold equal to or more than 3 seconds    Total Score  53    Berg comment:  Narrow BOS with decr stability but safe without device and RT  ankle lateral weakness presents highest risk of fall       High Level Balance   High Level Balance Activites  Braiding;Backward walking;Turns    High Level Balance Comments  He was able to  do the walking.  activity safely.                    [facility] Adult PT Treatment/Exercise - 06/08/18 0001      Knee/Hip Exercises: Aerobic   Nustep  LE x 6  min Level 6       Knee/Hip Exercises: Standing   Other Standing Knee Exercises  Core strength and stability with 2 blue  band x 15 chop/lift  ,cross chest pulls and shoulder ext x 15-20 reps  then bicep curls x 15 10 pounds      Knee/Hip Exercises: Sidelying   Hip ABduction  Right;15 reps;Limitations;Left    Hip ABduction Limitations  then 15 trying to keep body 45degrees  from vertical    Clams  2x10 foot on knee with down leg straight  RT/LT             PT Education - 06/08/18 0747    Education Details  Discussed BERg score and stability with narrow BOS and single leg balance need for ankle and hip strength.  Also benefits of chop/lift cross body pulls for core strength     Person(s) Educated  Patient    Methods  Explanation    Comprehension  Verbalized understanding       PT Short Term Goals - 04/11/18 5400      PT SHORT TERM GOAL #1   Title  He will be independent with iniital HEp    Time  4    Period  Weeks    Status  Achieved      PT SHORT TERM GOAL #2   Title  He will report greater ease with walking and lifting leg up to dry leg    Time  4    Period  Weeks    Status  Achieved      PT SHORT TERM GOAL #4   Title  Single leg balance improved to 5 sec RT and LT leg    Time  4    Period  Weeks    Status  Achieved        PT Long Term Goals - 06/02/18 8676  PT LONG TERM GOAL #1   Title  He will be independent wih all HEP issued    Status  On-going      PT LONG TERM GOAL #2   Title  He will have max 3/10 LBP with home tasks    Status  Achieved      PT LONG TERM GOAL #3   Title  He will have  3/10 max pain doing normal home tasks  without brace if allowed by MD.     Status  Achieved      PT LONG TERM GOAL #4   Title  He will be able to walk 2-3 mile with no incr pain.     Baseline  not doing distances walking as now working 5x/week full time    Status  On-going      PT LONG TERM GOAL #5   Title  FOTO score will improve to 42% limited to demo incr functional activity    Status  Achieved      PT LONG TERM GOAL #6   Title  Hip and thigh strength all 4/5 or better to be able to lift and return to work and to protect back.     Status  Unable to assess            Plan - 06/08/18 0703    Clinical Impression Statement  He is safe without device for activity but needs to be careful on unlevel surfaces due to ankle weakness He is not using brace so reminded him to be careful where he walks.       PT Treatment/Interventions  Electrical Stimulation;Moist Heat;Therapeutic exercise;Patient/family education;Manual techniques;Dry needling    PT Next Visit Plan  Continue strength and balance.     MMT    PT Home Exercise Plan  tandem balance , heel raises, hip abduction stand, wall sit , hip ext stretch (runners) and adductor stretching in standing, Abdominal set with exhaling in various positions.  chop and lift and cross body pulls  with band and weight.     Consulted and Agree with Plan of Care  Patient       Patient will benefit from skilled therapeutic intervention in order to improve the following deficits and impairments:  Decreased strength, Pain, Decreased balance  Visit Diagnosis: Muscle weakness (generalized)  Chronic bilateral low back pain without sciatica  Unsteadiness on feet     Problem List Patient Active Problem List   Diagnosis Date Noted  . Essential hypertension 02/16/2018  . Sepsis due to skin infection ([place]) 02/16/2018  . Chronic pain 02/16/2018  . Cellulitis of left lower extremity   . Lumbar radiculopathy, chronic 01/24/2018  . Lumbar stenosis  with neurogenic claudication 03/23/2017  . Hypothyroidism 07/25/2015  . Primary osteoarthritis of right knee 07/23/2015  . Primary osteoarthritis of knee 07/23/2015    Darrel Hoover  PT 06/08/2018, 7:51 AM  [facility] [address] [place], [place], [address] Phone: [phone]   Fax:  [phone]  Name: Larry [name] MRN: [id number] Date of Birth: 11/15/1950

## 2018-06-13 ENCOUNTER — Ambulatory Visit: Payer: Medicare Other

## 2018-06-13 DIAGNOSIS — G8929 Other chronic pain: Secondary | ICD-10-CM

## 2018-06-13 DIAGNOSIS — M545 Low back pain: Secondary | ICD-10-CM

## 2018-06-13 DIAGNOSIS — R2681 Unsteadiness on feet: Secondary | ICD-10-CM

## 2018-06-13 DIAGNOSIS — M6281 Muscle weakness (generalized): Secondary | ICD-10-CM | POA: Diagnosis not present

## 2018-06-13 NOTE — Therapy (Signed)
[place], [place], [address] Phone: [phone]   Fax:  [phone]  Physical Therapy Treatment  Patient Details  Name: Larry [name] MRN: [id number] Date of Birth: [birth year] Referring Provider: Kristeen Miss, MD   Encounter Date: 06/13/2018  PT End of Session - 06/13/18 0701    Visit Number  26    Number of Visits  27    Date for PT Re-Evaluation  06/17/18    Authorization Type  MCR     Authorization Time Period  Kx at visit 15  Progress visit 20    PT Start Time  0700    PT Stop Time  0745    PT Time Calculation (min)  45 min    Activity Tolerance  Patient tolerated treatment well    Behavior During Therapy  [facility] for tasks assessed/performed       Past Medical History:  Diagnosis Date  . Arthritis    "knees; left shoulder" (04/12/2013)  . Asthma    "as a child" (04/12/2013)  . DDD (degenerative disc disease), cervical   . DDD (degenerative disc disease), lumbar   . Difficult intubation    2012   CERVICAL FUSION   . Headache(784.0)    VERAPAMIL FOR PREVENTION   . 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 History of chicken pox    as an adult  . Hypoglycemia    last episode 1 yr. ago, just gets jittery  . Hypothyroidism   . Lumbar stenosis   . OSA (obstructive sleep apnea)    "haven't wore mask in years; had OR; still snore" (04/12/2013)  . Urinary frequency     Past Surgical History:  Procedure Laterality Date  . BACK SURGERY    . CERVICAL FUSION  2002; 2012  . CYSTECTOMY    . INGUINAL HERNIA REPAIR Bilateral 1997  . KNEE ARTHROSCOPY Left ~ 1997; ~ 2005  . LUMBAR FUSION  2007; 04/11/2013  . PENILE DEBRIDEMENT  ~ 2011   X 3  FOR MRSA INFECTION    . SHOULDER ARTHROSCOPY Left ~ 2003  . TONSILLECTOMY  1990's  . TOTAL KNEE ARTHROPLASTY Right 07/23/2015  . TOTAL KNEE ARTHROPLASTY Right 07/23/2015    Procedure: TOTAL KNEE ARTHROPLASTY;  Surgeon: Garald Balding, MD;  Location: [place];  Service: Orthopedics;  Laterality: Right;  . UVULOPALATOPHARYNGOPLASTY (UPPP)/TONSILLECTOMY/SEPTOPLASTY  1990's  . VASECTOMY      There were no vitals filed for this visit.  Subjective Assessment - 06/13/18 0703    Subjective  Grandson died this weekend.         Walked 6 miles at work.       Shots seem to help RT leg.     Currently in Pain?  No/denies         [facility] PT Assessment - 06/13/18 0001      Strength   Right Hip Flexion  5/5    Right Hip Extension  4+/5    Right Hip External Rotation   5/5    Right Hip Internal Rotation  4+/5    Right Hip ABduction  --   5-/5   Left Hip Flexion  5/5    Left Hip Extension  5/5    Left Hip External Rotation  5/5    Left Hip Internal Rotation  4+/5    Left Hip ABduction  5/5  Right/Left Knee  --   5-/5   Right Knee Flexion  5/5    Right Knee Extension  5/5    Left Knee Flexion  5/5    Left Knee Extension  5/5    Right Ankle Dorsiflexion  4+/5    Right Ankle Plantar Flexion  4/5    Right Ankle Inversion  5/5    Right Ankle Eversion  2-/5   great toe ext 3/5   Left Ankle Dorsiflexion  5/5    Left Ankle Plantar Flexion  4-/5    Left Ankle Inversion  5/5    Left Ankle Eversion  5/5                   [facility] Adult PT Treatment/Exercise - 06/13/18 0001      Knee/Hip Exercises: Aerobic   Nustep  LE x 6  min Level 6       Time was spent reviewing HEP he was able to do correctly after some minor cues.         PT Short Term Goals - 04/11/18 6967      PT SHORT TERM GOAL #1   Title  He will be independent with iniital HEp    Time  4    Period  Weeks    Status  Achieved      PT SHORT TERM GOAL #2   Title  He will report greater ease with walking and lifting leg up to dry leg    Time  4    Period  Weeks    Status  Achieved      PT SHORT TERM GOAL #4   Title  Single leg balance improved to 5 sec RT and LT leg    Time   4    Period  Weeks    Status  Achieved        PT Long Term Goals - 06/13/18 0751      PT LONG TERM GOAL #1   Title  He will be independent wih all HEP issued    Baseline  able to do all with monor cues as he is not doing them consistently    Status  Achieved      PT LONG TERM GOAL #2   Title  He will have max 3/10 LBP with home tasks    Status  Achieved      PT LONG TERM GOAL #3   Title  He will have 3/10 max pain doing normal home tasks  without brace if allowed by MD.     Status  Achieved      PT LONG TERM GOAL #4   Title  He will be able to walk 2-3 mile with no incr pain.     Baseline  he walked 6 miles at work total so goal met    Status  Achieved      PT LONG TERM GOAL #5   Title  FOTO score will improve to 42% limited to demo incr functional activity    Baseline  do next session    Status  Unable to assess      PT LONG TERM GOAL #6   Title  Hip and thigh strength all 4/5 or better to be able to lift and return to work and to protect back.     Status  Achieved            Plan - 06/13/18 0701    Clinical Impression Statement  HEP reviewed ad it appears he is doing less HEP due to now working full time. Marland Kitchen  His strength is improved in both LE but cont weakness with PF bilaterally and RT ankle eversion. Black band issued for ankle and  chop /lift exercies at home. Final session 9/19.      PT Treatment/Interventions  Electrical Stimulation;Moist Heat;Therapeutic exercise;Patient/family education;Manual techniques;Dry needling    PT Next Visit Plan  Continue strength and balance.    FOTO       PT Home Exercise Plan  tandem balance , heel raises, hip abduction stand, wall sit , hip ext stretch (runners) and adductor stretching in standing, Abdominal set with exhaling in various positions.  chop and lift and cross body pulls  with band and weight.     Consulted and Agree with Plan of Care  Patient       Patient will benefit from skilled therapeutic intervention in  order to improve the following deficits and impairments:  Decreased strength, Pain, Decreased balance  Visit Diagnosis: Muscle weakness (generalized)  Chronic bilateral low back pain without sciatica  Unsteadiness on feet     Problem List Patient Active Problem List   Diagnosis Date Noted  . Essential hypertension 02/16/2018  . Sepsis due to skin infection ([place]) 02/16/2018  . Chronic pain 02/16/2018  . Cellulitis of left lower extremity   . Lumbar radiculopathy, chronic 01/24/2018  . Lumbar stenosis with neurogenic claudication 03/23/2017  . Hypothyroidism 07/25/2015  . Primary osteoarthritis of right knee 07/23/2015  . Primary osteoarthritis of knee 07/23/2015    Darrel Hoover  PT 06/13/2018, 7:53 AM  [facility] [address] [place], [place], [address] Phone: [phone]   Fax:  [phone]  Name: INGVALD THEISEN MRN: [id number] Date of Birth: 1950-12-17

## 2018-06-15 ENCOUNTER — Encounter: Payer: Self-pay | Admitting: Physical Therapy

## 2018-06-15 ENCOUNTER — Ambulatory Visit: Payer: Medicare Other | Admitting: Physical Therapy

## 2018-06-15 DIAGNOSIS — M6281 Muscle weakness (generalized): Secondary | ICD-10-CM

## 2018-06-15 DIAGNOSIS — G8929 Other chronic pain: Secondary | ICD-10-CM | POA: Diagnosis not present

## 2018-06-15 DIAGNOSIS — R2681 Unsteadiness on feet: Secondary | ICD-10-CM | POA: Diagnosis not present

## 2018-06-15 DIAGNOSIS — M545 Low back pain: Secondary | ICD-10-CM | POA: Diagnosis not present

## 2018-06-15 NOTE — Therapy (Addendum)
[place], [place], [address] Phone: [phone]   Fax:  [phone]  Physical Therapy Treatment/Discharge  Patient Details  Name: Larry [name] MRN: [id number] Date of Birth: [birth year] Referring Provider: Kristeen Miss, MD   Encounter Date: 06/15/2018  PT End of Session - 06/15/18 0721    Visit Number  27    Number of Visits  27    Date for PT Re-Evaluation  06/17/18    Authorization Type  MCR     Authorization Time Period  Kx at visit 15  Progress visit 20    PT Start Time  0715    PT Stop Time  0800    PT Time Calculation (min)  45 min       Past Medical History:  Diagnosis Date  . Arthritis    "knees; left shoulder" (04/12/2013)  . Asthma    "as a child" (04/12/2013)  . DDD (degenerative disc disease), cervical   . DDD (degenerative disc disease), lumbar   . Difficult intubation    2012   CERVICAL FUSION   . Headache(784.0)    VERAPAMIL FOR PREVENTION   . 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 History of chicken pox    as an adult  . Hypoglycemia    last episode 1 yr. ago, just gets jittery  . Hypothyroidism   . Lumbar stenosis   . OSA (obstructive sleep apnea)    "haven't wore mask in years; had OR; still snore" (04/12/2013)  . Urinary frequency     Past Surgical History:  Procedure Laterality Date  . BACK SURGERY    . CERVICAL FUSION  2002; 2012  . CYSTECTOMY    . INGUINAL HERNIA REPAIR Bilateral 1997  . KNEE ARTHROSCOPY Left ~ 1997; ~ 2005  . LUMBAR FUSION  2007; 04/11/2013  . PENILE DEBRIDEMENT  ~ 2011   X 3  FOR MRSA INFECTION    . SHOULDER ARTHROSCOPY Left ~ 2003  . TONSILLECTOMY  1990's  . TOTAL KNEE ARTHROPLASTY Right 07/23/2015  . TOTAL KNEE ARTHROPLASTY Right 07/23/2015   Procedure: TOTAL KNEE ARTHROPLASTY;  Surgeon: Garald Balding, MD;  Location: [facility];  Service:  Orthopedics;  Laterality: Right;  . UVULOPALATOPHARYNGOPLASTY (UPPP)/TONSILLECTOMY/SEPTOPLASTY  1990's  . VASECTOMY      There were no vitals filed for this visit.  Subjective Assessment - 06/15/18 0721    Subjective  Today's is my last day.     Currently in Pain?  No/denies         [facility] PT Assessment - 06/15/18 0001      Observation/Other Assessments   Focus on Therapeutic Outcomes (FOTO)   28% limited improved from 54% limited at eval                    [facility] Adult PT Treatment/Exercise - 06/15/18 0001      Knee/Hip Exercises: Aerobic   Nustep  LE x 6  min Level 6       Knee/Hip Exercises: Standing   SLS  12 sec best bilat     Other Standing Knee Exercises  3 way hp 3# 20 reps each , tandem gait, tandem stance     Other Standing Knee Exercises  Core stability chop blue band x 20 each way, cross chest pulls, shoulder extension, bicep curls 10#  Knee/Hip Exercises: Supine   Bridges  Both;20 reps    Straight Leg Raises  Right;Left;20 reps      Knee/Hip Exercises: Sidelying   Hip ABduction  Right;15 reps;Left    Hip ABduction Limitations  then 15 trying to keep body 45degrees  from vertical    Clams  2x10 foot on knee with down leg straight  RT/LT               PT Short Term Goals - 04/11/18 2952      PT SHORT TERM GOAL #1   Title  He will be independent with iniital HEp    Time  4    Period  Weeks    Status  Achieved      PT SHORT TERM GOAL #2   Title  He will report greater ease with walking and lifting leg up to dry leg    Time  4    Period  Weeks    Status  Achieved      PT SHORT TERM GOAL #4   Title  Single leg balance improved to 5 sec RT and LT leg    Time  4    Period  Weeks    Status  Achieved        PT Long Term Goals - 06/15/18 0731      PT LONG TERM GOAL #1   Title  He will be independent wih all HEP issued    Baseline  able to do all with monor cues as he is not doing them consistently    Time  8    Period   Weeks    Status  Achieved      PT LONG TERM GOAL #2   Title  He will have max 3/10 LBP with home tasks    Baseline  no back pain    Time  8    Period  Weeks    Status  Achieved      PT LONG TERM GOAL #3   Title  He will have 3/10 max pain doing normal home tasks  without brace if allowed by MD.     Baseline  no  back pain     Time  8    Period  Weeks    Status  Achieved      PT LONG TERM GOAL #4   Baseline  he walked 6 miles at work total so goal met    Time  8    Period  Weeks    Status  Achieved      PT LONG TERM GOAL #5   Title  FOTO score will improve to 42% limited to demo incr functional activity    Baseline  28% limited     Time  8    Period  Weeks    Status  Achieved      PT LONG TERM GOAL #6   Title  Hip and thigh strength all 4/5 or better to be able to lift and return to work and to protect back.     Time  4    Period  Weeks    Status  Achieved            Plan - 06/15/18 0732    Clinical Impression Statement  Strength improved in both LE but continued weakness with PF bilaterally and RT ankle eversion. FOTO score improved to 28% limited. All LTGS met. Reviewed HEP. He is working full time now and  plans to perform HEP 3 x per week.     PT Next Visit Plan  Discharge today    PT Home Exercise Plan  tandem balance , heel raises, hip abduction stand, wall sit , hip ext stretch (runners) and adductor stretching in standing, Abdominal set with exhaling in various positions.  chop and lift and cross body pulls  with band and weight.     Consulted and Agree with Plan of Care  Patient       Patient will benefit from skilled therapeutic intervention in order to improve the following deficits and impairments:  Decreased strength, Pain, Decreased balance  Visit Diagnosis: Muscle weakness (generalized)  Chronic bilateral low back pain without sciatica  Unsteadiness on feet     Problem List Patient Active Problem List   Diagnosis Date Noted  . Essential  hypertension 02/16/2018  . Sepsis due to skin infection ([place]) 02/16/2018  . Chronic pain 02/16/2018  . Cellulitis of left lower extremity   . Lumbar radiculopathy, chronic 01/24/2018  . Lumbar stenosis with neurogenic claudication 03/23/2017  . Hypothyroidism 07/25/2015  . Primary osteoarthritis of right knee 07/23/2015  . Primary osteoarthritis of knee 07/23/2015    Dorene Ar, [place] 06/15/2018, 8:05 AM  [facility] [address] [place], [place], [address] Phone: [phone]   Fax:  [phone]  Name: Larry [name] MRN: [id number] Date of Birth: [birth year]  PHYSICAL THERAPY DISCHARGE SUMMARY  Visits from Start of Care: 27  Current functional level related to goals / functional outcomes: See above  Remaining deficits: See above   Education / Equipment: HEP Plan: Patient agrees to discharge.  Patient goals were met. Patient is being discharged due to meeting the stated rehab goals.  ?????    Pearson Forster  PT  06/16/18

## 2018-07-05 DIAGNOSIS — M545 Low back pain: Secondary | ICD-10-CM | POA: Diagnosis not present

## 2018-07-05 DIAGNOSIS — G5601 Carpal tunnel syndrome, right upper limb: Secondary | ICD-10-CM | POA: Diagnosis not present

## 2018-07-05 DIAGNOSIS — R202 Paresthesia of skin: Secondary | ICD-10-CM | POA: Diagnosis not present

## 2018-07-05 DIAGNOSIS — R201 Hypoesthesia of skin: Secondary | ICD-10-CM | POA: Diagnosis not present

## 2018-07-05 DIAGNOSIS — M5412 Radiculopathy, cervical region: Secondary | ICD-10-CM | POA: Diagnosis not present

## 2018-07-14 DIAGNOSIS — M5416 Radiculopathy, lumbar region: Secondary | ICD-10-CM | POA: Diagnosis not present

## 2018-07-29 ENCOUNTER — Ambulatory Visit (HOSPITAL_COMMUNITY)
Admission: EM | Admit: 2018-07-29 | Discharge: 2018-07-29 | Disposition: A | Discharge status: Home / Self Care | Payer: Medicare Other | Attending: Family Medicine | Admitting: Family Medicine

## 2018-07-29 ENCOUNTER — Other Ambulatory Visit: Payer: Self-pay

## 2018-07-29 DIAGNOSIS — Z79890 Hormone replacement therapy (postmenopausal): Secondary | ICD-10-CM | POA: Insufficient documentation

## 2018-07-29 DIAGNOSIS — I10 Essential (primary) hypertension: Secondary | ICD-10-CM | POA: Insufficient documentation

## 2018-07-29 DIAGNOSIS — Z96651 Presence of right artificial knee joint: Secondary | ICD-10-CM | POA: Diagnosis not present

## 2018-07-29 DIAGNOSIS — N400 Enlarged prostate without lower urinary tract symptoms: Secondary | ICD-10-CM | POA: Diagnosis not present

## 2018-07-29 DIAGNOSIS — Z7982 Long term (current) use of aspirin: Secondary | ICD-10-CM | POA: Insufficient documentation

## 2018-07-29 DIAGNOSIS — G4733 Obstructive sleep apnea (adult) (pediatric): Secondary | ICD-10-CM | POA: Insufficient documentation

## 2018-07-29 DIAGNOSIS — B9689 Other specified bacterial agents as the cause of diseases classified elsewhere: Secondary | ICD-10-CM | POA: Diagnosis not present

## 2018-07-29 DIAGNOSIS — Z87891 Personal history of nicotine dependence: Secondary | ICD-10-CM | POA: Diagnosis not present

## 2018-07-29 DIAGNOSIS — N390 Urinary tract infection, site not specified: Secondary | ICD-10-CM | POA: Diagnosis not present

## 2018-07-29 DIAGNOSIS — Z79899 Other long term (current) drug therapy: Secondary | ICD-10-CM | POA: Diagnosis not present

## 2018-07-29 DIAGNOSIS — E039 Hypothyroidism, unspecified: Secondary | ICD-10-CM | POA: Diagnosis not present

## 2018-07-29 LAB — POCT URINALYSIS DIP (DEVICE)
Bilirubin Urine: NEGATIVE
Glucose, UA: NEGATIVE mg/dL
Ketones, ur: 15 mg/dL — AB
Leukocytes, UA: NEGATIVE
Nitrite: NEGATIVE
Protein, ur: 30 mg/dL — AB
Specific Gravity, Urine: >=1.03 (ref 1.005–1.030)
Urobilinogen, UA: 0.2 mg/dL (ref 0.0–1.0)
pH: 5.5 (ref 5.0–8.0)

## 2018-07-29 MED ORDER — SULFAMETHOXAZOLE-TRIMETHOPRIM 800-160 MG PO TABS: 1.0000 | ORAL_TABLET | Freq: Two times a day (BID) | ORAL | 0 refills | Status: AC

## 2018-07-29 NOTE — Discharge Instructions (Addendum)
Take the antibiotic Push fluid We did lab testing during this visit.  If there are any abnormal findings that require change in medicine or indicate a positive result, you will be notified.  If all of your tests are normal, you will not be called.

## 2018-07-29 NOTE — ED Triage Notes (Signed)
One week and a half ago had sex.  Reports partner had a uti.  Patient has prostate issues.  Patient has noticed discharge or drainage in underwear.  No pain

## 2018-07-29 NOTE — ED Provider Notes (Signed)
[facility]    CSN: [id number] Arrival date & time: 07/29/18  1859     History   Chief Complaint Chief Complaint  Patient presents with  . Urinary Tract Infection    HPI Larry [name] is a 67 y.o. male.   HPI  Patient has known BPH.  He lives alone.  He does not often have sexual relations.  He states his ex-wife came to visit and he did attempt to have sex with her.  They did not use a condom.  He was not able to ejaculate.  Ever since then he has been having straining to pass urine.  He is noticed some staining in his underwear that he thinks may be drainage.  He thinks is from his urine.  He does not have any burning with urination or penile discharge.  He is certain he does not have an STD.  He thinks he has a bladder infection.  No back pain.  No fever.  No nausea vomiting.  No flank pain.  Past Medical History:  Diagnosis Date  . Arthritis    "knees; left shoulder" (04/12/2013)  . Asthma    "as a child" (04/12/2013)  . DDD (degenerative disc disease), cervical   . DDD (degenerative disc disease), lumbar   . Difficult intubation    2012   CERVICAL FUSION   . Headache(784.0)    VERAPAMIL FOR PREVENTION   . 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 History of chicken pox    as an adult  . Hypoglycemia    last episode 1 yr. ago, just gets jittery  . Hypothyroidism   . Lumbar stenosis   . OSA (obstructive sleep apnea)    "haven't wore mask in years; had OR; still snore" (04/12/2013)  . Urinary frequency     Patient Active Problem List   Diagnosis Date Noted  . Essential hypertension 02/16/2018  . Sepsis due to skin infection ([place]) 02/16/2018  . Chronic pain 02/16/2018  . Cellulitis of left lower extremity   . Lumbar radiculopathy, chronic 01/24/2018  . Lumbar stenosis with neurogenic claudication 03/23/2017  . Hypothyroidism 07/25/2015  . Primary osteoarthritis  of right knee 07/23/2015  . Primary osteoarthritis of knee 07/23/2015    Past Surgical History:  Procedure Laterality Date  . BACK SURGERY    . CERVICAL FUSION  2002; 2012  . CYSTECTOMY    . INGUINAL HERNIA REPAIR Bilateral 1997  . KNEE ARTHROSCOPY Left ~ 1997; ~ 2005  . LUMBAR FUSION  2007; 04/11/2013  . PENILE DEBRIDEMENT  ~ 2011   X 3  FOR MRSA INFECTION    . SHOULDER ARTHROSCOPY Left ~ 2003  . TONSILLECTOMY  1990's  . TOTAL KNEE ARTHROPLASTY Right 07/23/2015  . TOTAL KNEE ARTHROPLASTY Right 07/23/2015   Procedure: TOTAL KNEE ARTHROPLASTY;  Surgeon: Garald Balding, MD;  Location: [facility];  Service: Orthopedics;  Laterality: Right;  . UVULOPALATOPHARYNGOPLASTY (UPPP)/TONSILLECTOMY/SEPTOPLASTY  1990's  . VASECTOMY         Home Medications    Prior to Admission medications   Medication Sig Start Date End Date Taking? Authorizing Provider  aspirin EC 81 MG tablet Take 81 mg by mouth daily.    [provider]  furosemide (LASIX) 40 MG tablet Take 40 mg by mouth daily as needed for edema.     [provider]  HYDROcodone-acetaminophen (NORCO/VICODIN) 5-325 MG  tablet Take 1 tablet by mouth every 6 (six) hours as needed for pain.  12/30/17   [provider]  HYDROmorphone (DILAUDID) 2 MG tablet Take 1 tablet (2 mg total) by mouth every 4 (four) hours as needed for severe pain. 01/25/18   Kristeen Miss, MD  latanoprost (XALATAN) 0.005 % ophthalmic solution Place 1 drop into both eyes at bedtime. 03/13/17   [provider]  levothyroxine (SYNTHROID, LEVOTHROID) 150 MCG tablet Take 150 mcg by mouth daily before breakfast.    [provider]  LORazepam (ATIVAN) 0.5 MG tablet Take 1 mg by mouth at bedtime.  03/14/17   [provider]  Multiple Vitamins-Minerals (MULTIVITAMIN PO) Take 1 tablet by mouth daily.    [provider]  Oxymetazoline HCl (AFRIN NASAL SPRAY NA) Place 2 sprays into both nostrils 2 (two) times daily as needed  (nasal congestion).    [provider]  Phosphatidylserine-DHA-EPA (VAYACOG) 100-19.5-6.5 MG CAPS Take 1 capsule by mouth daily.    [provider]  Sildenafil Citrate (VIAGRA PO) Take 30 mg by mouth daily as needed (for erectile dysfunction). Purchased from [email or web address]--dose indicated on website as 30 mg    [provider]  sulfamethoxazole-trimethoprim (BACTRIM DS,SEPTRA DS) 800-160 MG tablet Take 1 tablet by mouth 2 (two) times daily for 7 days. 07/29/18 08/05/18  Raylene Everts, MD  tamsulosin (FLOMAX) 0.4 MG CAPS capsule Take 0.4 mg by mouth daily.    [provider]  testosterone cypionate (DEPOTESTOSTERONE CYPIONATE) 200 MG/ML injection Inject 100 mg into the muscle every 14 (fourteen) days. 02/07/18   [provider]  verapamil (CALAN-SR) 180 MG CR tablet Take 180 mg by mouth daily. 12/31/16   [provider]    Family History No family history on file.  Social History Social History   Tobacco Use  . Smoking status: Former Smoker    Packs/day: 0.50    Years: 15.00    Pack years: 7.50    Types: Cigarettes    Last attempt to quit: 10/14/1986    Years since quitting: 31.8  . Smokeless tobacco: Never Used  Substance Use Topics  . Alcohol use: No  . Drug use: No     Allergies   Patient has no known allergies.   Review of Systems Review of Systems  Constitutional: Negative for chills and fever.  HENT: Negative for ear pain and sore throat.   Eyes: Negative for pain and visual disturbance.  Respiratory: Negative for cough and shortness of breath.   Cardiovascular: Negative for chest pain and palpitations.  Gastrointestinal: Negative for abdominal pain and vomiting.  Genitourinary: Positive for difficulty urinating and frequency. Negative for dysuria and hematuria.  Musculoskeletal: Negative for arthralgias and back pain.  Skin: Negative for color change and rash.  Neurological: Negative for seizures and syncope.  All  other systems reviewed and are negative.    Physical Exam Triage Vital Signs ED Triage Vitals  Enc Vitals Group     BP 07/29/18 2008 (!) 157/96     Pulse Rate 07/29/18 2008 66     Resp 07/29/18 2008 16     Temp 07/29/18 2008 (!) 97 F (36.1 C)     Temp Source 07/29/18 2008 Oral     SpO2 07/29/18 2008 96 %     Weight --      Height --      Head Circumference --      Peak Flow --      Pain Score 07/29/18  2005 0     Pain Loc --      Pain Edu? --      Excl. in [place]? --    No data found.  Updated Vital Signs BP (!) 157/96 (BP Location: Right Arm)   Pulse 66   Temp (!) 97 F (36.1 C) (Oral)   Resp 16   SpO2 96%       Physical Exam  Constitutional: He appears well-developed and well-nourished. No distress.  HENT:  Head: Normocephalic and atraumatic.  Mouth/Throat: Oropharynx is clear and moist.  Eyes: Pupils are equal, round, and reactive to light. Conjunctivae are normal.  Neck: Normal range of motion.  Cardiovascular: Normal rate.  Pulmonary/Chest: Effort normal. No respiratory distress.  Abdominal: Soft. He exhibits no distension.  Musculoskeletal: Normal range of motion. He exhibits no edema.  Neurological: He is alert.  Skin: Skin is warm and dry.     UC Treatments / Results  Labs (all labs ordered are listed, but only abnormal results are displayed) Labs Reviewed  POCT URINALYSIS DIP (DEVICE) - Abnormal; Notable for the following components:      Result Value   Ketones, ur 15 (*)    Hgb urine dipstick LARGE (*)    Protein, ur 30 (*)    All other components within normal limits  URINE CULTURE    EKG None  Radiology No results found.  Procedures Procedures (including critical care time)  Medications Ordered in UC Medications - No data to display  Initial Impression / Assessment and Plan / UC Course  I have reviewed the triage vital signs and the nursing notes.  Pertinent labs & imaging results that were available during my care of the patient  were reviewed by me and considered in my medical decision making (see chart for details).     Mild urinary symptoms may be BPH.  Hematuria on urinalysis.  Patient denies need for STD testing.  Will test urine for infection, urine culture sent. Final Clinical Impressions(s) / UC Diagnoses   Final diagnoses:  Lower urinary tract infectious disease     Discharge Instructions     Take the antibiotic Push fluid We did lab testing during this visit.  If there are any abnormal findings that require change in medicine or indicate a positive result, you will be notified.  If all of your tests are normal, you will not be called.      ED Prescriptions    Medication Sig Dispense Auth. Provider   sulfamethoxazole-trimethoprim (BACTRIM DS,SEPTRA DS) 800-160 MG tablet Take 1 tablet by mouth 2 (two) times daily for 7 days. 14 tablet Raylene Everts, MD     Controlled Substance Prescriptions [place] Controlled Substance Registry consulted? Not Applicable   Raylene Everts, MD 07/29/18 2152

## 2018-08-01 ENCOUNTER — Telehealth (HOSPITAL_COMMUNITY): Payer: Self-pay

## 2018-08-01 LAB — URINE CULTURE: Culture: 50000 — AB

## 2018-08-01 MED ORDER — NITROFURANTOIN MONOHYD MACRO 100 MG PO CAPS: 100.0000 mg | ORAL_CAPSULE | Freq: Two times a day (BID) | ORAL | 0 refills | Status: DC

## 2018-08-01 NOTE — Telephone Encounter (Signed)
Urine culture positive for Enterococcus Faecalis. This will not be properly treated with Bactrim given at time of visit.  Rx for Macrobid sent to pharmacy of choice.  Attempted to reach patient. No answer at this time. Message sent to mychart.

## 2018-08-03 ENCOUNTER — Telehealth (HOSPITAL_COMMUNITY): Payer: Self-pay

## 2018-08-03 NOTE — Telephone Encounter (Signed)
Attempted to reach patient x 2 

## 2018-08-05 ENCOUNTER — Telehealth (HOSPITAL_COMMUNITY): Payer: Self-pay

## 2018-08-16 DIAGNOSIS — R319 Hematuria, unspecified: Secondary | ICD-10-CM | POA: Diagnosis not present

## 2018-08-16 DIAGNOSIS — Z0000 Encounter for general adult medical examination without abnormal findings: Secondary | ICD-10-CM | POA: Diagnosis not present

## 2018-08-16 DIAGNOSIS — E291 Testicular hypofunction: Secondary | ICD-10-CM | POA: Diagnosis not present

## 2018-08-16 DIAGNOSIS — I10 Essential (primary) hypertension: Secondary | ICD-10-CM | POA: Diagnosis not present

## 2018-08-16 DIAGNOSIS — R972 Elevated prostate specific antigen [PSA]: Secondary | ICD-10-CM | POA: Diagnosis not present

## 2018-08-16 DIAGNOSIS — N390 Urinary tract infection, site not specified: Secondary | ICD-10-CM | POA: Diagnosis not present

## 2018-08-16 DIAGNOSIS — E039 Hypothyroidism, unspecified: Secondary | ICD-10-CM | POA: Diagnosis not present

## 2018-08-16 DIAGNOSIS — Z5181 Encounter for therapeutic drug level monitoring: Secondary | ICD-10-CM | POA: Diagnosis not present

## 2018-08-24 DIAGNOSIS — Z79899 Other long term (current) drug therapy: Secondary | ICD-10-CM | POA: Diagnosis not present

## 2018-08-24 DIAGNOSIS — Z0000 Encounter for general adult medical examination without abnormal findings: Secondary | ICD-10-CM | POA: Diagnosis not present

## 2018-08-24 DIAGNOSIS — Z125 Encounter for screening for malignant neoplasm of prostate: Secondary | ICD-10-CM | POA: Diagnosis not present

## 2018-08-24 DIAGNOSIS — E039 Hypothyroidism, unspecified: Secondary | ICD-10-CM | POA: Diagnosis not present

## 2018-08-24 DIAGNOSIS — E291 Testicular hypofunction: Secondary | ICD-10-CM | POA: Diagnosis not present

## 2018-08-24 DIAGNOSIS — N3001 Acute cystitis with hematuria: Secondary | ICD-10-CM | POA: Diagnosis not present

## 2018-08-24 DIAGNOSIS — I10 Essential (primary) hypertension: Secondary | ICD-10-CM | POA: Diagnosis not present

## 2018-09-14 DIAGNOSIS — R202 Paresthesia of skin: Secondary | ICD-10-CM | POA: Diagnosis not present

## 2018-09-14 DIAGNOSIS — R201 Hypoesthesia of skin: Secondary | ICD-10-CM | POA: Diagnosis not present

## 2018-09-14 DIAGNOSIS — G5601 Carpal tunnel syndrome, right upper limb: Secondary | ICD-10-CM | POA: Diagnosis not present

## 2018-09-14 DIAGNOSIS — M545 Low back pain: Secondary | ICD-10-CM | POA: Diagnosis not present

## 2018-11-18 DIAGNOSIS — I10 Essential (primary) hypertension: Secondary | ICD-10-CM | POA: Diagnosis not present

## 2018-11-18 DIAGNOSIS — E291 Testicular hypofunction: Secondary | ICD-10-CM | POA: Diagnosis not present

## 2018-11-18 DIAGNOSIS — E039 Hypothyroidism, unspecified: Secondary | ICD-10-CM | POA: Diagnosis not present

## 2018-11-28 DIAGNOSIS — M150 Primary generalized (osteo)arthritis: Secondary | ICD-10-CM | POA: Diagnosis not present

## 2018-11-28 DIAGNOSIS — G8929 Other chronic pain: Secondary | ICD-10-CM | POA: Diagnosis not present

## 2018-11-28 DIAGNOSIS — F419 Anxiety disorder, unspecified: Secondary | ICD-10-CM | POA: Diagnosis not present

## 2018-11-28 DIAGNOSIS — I10 Essential (primary) hypertension: Secondary | ICD-10-CM | POA: Diagnosis not present

## 2018-11-28 DIAGNOSIS — E291 Testicular hypofunction: Secondary | ICD-10-CM | POA: Diagnosis not present

## 2018-11-28 DIAGNOSIS — E039 Hypothyroidism, unspecified: Secondary | ICD-10-CM | POA: Diagnosis not present

## 2018-12-09 ENCOUNTER — Other Ambulatory Visit: Payer: Self-pay

## 2018-12-09 ENCOUNTER — Encounter (INDEPENDENT_AMBULATORY_CARE_PROVIDER_SITE_OTHER): Payer: Self-pay | Admitting: Orthopaedic Surgery

## 2018-12-09 ENCOUNTER — Ambulatory Visit (INDEPENDENT_AMBULATORY_CARE_PROVIDER_SITE_OTHER): Payer: Medicare Other | Admitting: Orthopaedic Surgery

## 2018-12-09 ENCOUNTER — Ambulatory Visit (INDEPENDENT_AMBULATORY_CARE_PROVIDER_SITE_OTHER): Payer: Medicare Other

## 2018-12-09 VITALS — BP 142/77 | HR 60 | Ht 66.0 in | Wt 178.0 lb

## 2018-12-09 DIAGNOSIS — M7542 Impingement syndrome of left shoulder: Secondary | ICD-10-CM | POA: Diagnosis not present

## 2018-12-09 DIAGNOSIS — G8929 Other chronic pain: Secondary | ICD-10-CM | POA: Diagnosis not present

## 2018-12-09 DIAGNOSIS — M25512 Pain in left shoulder: Secondary | ICD-10-CM

## 2018-12-09 MED ORDER — BUPIVACAINE HCL 0.5 % IJ SOLN
2.0000 mL | INTRAMUSCULAR | Status: AC | PRN
  Administered 2018-12-09: 2 mL via INTRA_ARTICULAR

## 2018-12-09 MED ORDER — METHYLPREDNISOLONE ACETATE 40 MG/ML IJ SUSP
80.0000 mg | INTRAMUSCULAR | Status: AC | PRN
  Administered 2018-12-09: 80 mg via INTRA_ARTICULAR

## 2018-12-09 MED ORDER — LIDOCAINE HCL 2 % IJ SOLN
2.0000 mL | INTRAMUSCULAR | Status: AC | PRN
  Administered 2018-12-09: 2 mL

## 2018-12-09 NOTE — Progress Notes (Signed)
Office Visit Note   Patient: Larry [name]           Date of Birth: Sep 10, 1951           MRN: [id number] Visit Date: 12/09/2018              Requested by: Merrilee Seashore, [address] [place], [place] [address] PCP: Merrilee Seashore, MD   Assessment & Plan: Visit Diagnoses:  1. Chronic left shoulder pain     Plan: Impingement syndrome left shoulder.  Long discussion regarding diagnostic possibilities.  We will start with a subacromial cortisone injection and monitor response.  Consider MRI scan provement  Follow-Up Instructions: Return if symptoms worsen or fail to improve.   Orders:  Orders Placed This Encounter  Procedures  . Large Joint Inj: L subacromial bursa  . XR Shoulder Left   No orders of the defined types were placed in this encounter.     Procedures: Large Joint Inj: L subacromial bursa on 12/09/2018 9:16 AM Indications: pain and diagnostic evaluation Details: 25 G 1.5 in needle, anterolateral approach  Arthrogram: No  Medications: 2 mL lidocaine 2 %; 2 mL bupivacaine 0.5 %; 80 mg methylPREDNISolone acetate 40 MG/ML Consent was given by the patient. Immediately prior to procedure a time out was called to verify the correct patient, procedure, equipment, support staff and site/side marked as required. Patient was prepped and draped in the usual sterile fashion.       Clinical Data: No additional findings.   Subjective: Chief Complaint  Patient presents with  . Left Shoulder - Pain  Patient presents today with left shoulder problems. He said that it has been hurting for about 6 months. He said that it hurts more superiorly and increased when he abducts his arm. He feels that his arm feels weaker. He is right hand dominant. He does not take anything for pain except Meloxicam. No pain radiates down his arm, and no numbness or tingling. He has a history of left shoulder surgery with Dr.Jenipher Havel to remove a spur. Mr. Yaworski  relates that he fell in May 2019 when he caught his foot on a step.  At the time he had an injury to his left shoulder.  Since then he has had pain off and on oftentimes to the point of sleep deprivation.  He is able to raise his arm over his head.  He is not having any neck pain.  He has had a number of back surgeries but presently is feeling quite good".  Is not had any numbness or tingling in his left upper extremity HPI  Review of Systems   Objective: Vital Signs: BP (!) 142/77   Pulse 60   Ht 5\' 6"  (1.676 m)   Wt 178 lb (80.7 kg)   BMI 28.73 kg/m   Physical Exam Constitutional:      Appearance: He is well-developed.  Eyes:     Pupils: Pupils are equal, round, and reactive to light.  Pulmonary:     Effort: Pulmonary effort is normal.  Skin:    General: Skin is warm and dry.  Neurological:     Mental Status: He is alert and oriented to person, place, and time.  Psychiatric:        Behavior: Behavior normal.     Ortho Exam awake alert and oriented x3.  Comfortable sitting.  Does have a slight limp referable to the arthritic changes in his left knee.  He was able to place  his left arm fully over his head.  Very minimal impingement on the extreme of external rotation.  Did have a minimally positive empty can test.  He had good strength but some pain with external rotation.  Skin intact.  Very minimal tenderness along the anterior subacromial region but not at the acromioclavicular joint.  Biceps intact.  Good grip and good release  Specialty Comments:  No specialty comments available.  Imaging: Xr Shoulder Left  Result Date: 12/09/2018 Numbness of the left shoulder pain several projections.  There is no ectopic calcification.  No acute changes.  Humeral head is centered about the glenoid.  Degenerative changes of the chromic clavicular joint are identified with hypertrophic changes.  Normal space between the humeral head and the acromium    PMFS History: Patient Active  Problem List   Diagnosis Date Noted  . Essential hypertension 02/16/2018  . Sepsis due to skin infection ([place]) 02/16/2018  . Chronic pain 02/16/2018  . Cellulitis of left lower extremity   . Lumbar radiculopathy, chronic 01/24/2018  . Lumbar stenosis with neurogenic claudication 03/23/2017  . Hypothyroidism 07/25/2015  . Primary osteoarthritis of right knee 07/23/2015  . Primary osteoarthritis of knee 07/23/2015   Past Medical History:  Diagnosis Date  . Arthritis    "knees; left shoulder" (04/12/2013)  . Asthma    "as a child" (04/12/2013)  . DDD (degenerative disc disease), cervical   . DDD (degenerative disc disease), lumbar   . Difficult intubation    2012   CERVICAL FUSION   . Headache(784.0)    VERAPAMIL FOR PREVENTION   . 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 History of chicken pox    as an adult  . Hypoglycemia    last episode 1 yr. ago, just gets jittery  . Hypothyroidism   . Lumbar stenosis   . OSA (obstructive sleep apnea)    "haven't wore mask in years; had OR; still snore" (04/12/2013)  . Urinary frequency     History reviewed. No pertinent family history.  Past Surgical History:  Procedure Laterality Date  . BACK SURGERY    . CERVICAL FUSION  2002; 2012  . CYSTECTOMY    . INGUINAL HERNIA REPAIR Bilateral 1997  . KNEE ARTHROSCOPY Left ~ 1997; ~ 2005  . LUMBAR FUSION  2007; 04/11/2013  . PENILE DEBRIDEMENT  ~ 2011   X 3  FOR MRSA INFECTION    . SHOULDER ARTHROSCOPY Left ~ 2003  . TONSILLECTOMY  1990's  . TOTAL KNEE ARTHROPLASTY Right 07/23/2015  . TOTAL KNEE ARTHROPLASTY Right 07/23/2015   Procedure: TOTAL KNEE ARTHROPLASTY;  Surgeon: Garald Balding, MD;  Location: [facility];  Service: Orthopedics;  Laterality: Right;  . UVULOPALATOPHARYNGOPLASTY (UPPP)/TONSILLECTOMY/SEPTOPLASTY  1990's  . VASECTOMY     Social History   Occupational History  . Not on file   Tobacco Use  . Smoking status: Former Smoker    Packs/day: 0.50    Years: 15.00    Pack years: 7.50    Types: Cigarettes    Last attempt to quit: 10/14/1986    Years since quitting: 32.1  . Smokeless tobacco: Never Used  Substance and Sexual Activity  . Alcohol use: No  . Drug use: No  . Sexual activity: Yes

## 2018-12-14 DIAGNOSIS — R201 Hypoesthesia of skin: Secondary | ICD-10-CM | POA: Diagnosis not present

## 2018-12-14 DIAGNOSIS — R200 Anesthesia of skin: Secondary | ICD-10-CM | POA: Diagnosis not present

## 2018-12-14 DIAGNOSIS — M545 Low back pain: Secondary | ICD-10-CM | POA: Diagnosis not present

## 2018-12-14 DIAGNOSIS — G5601 Carpal tunnel syndrome, right upper limb: Secondary | ICD-10-CM | POA: Diagnosis not present

## 2019-02-23 DIAGNOSIS — E291 Testicular hypofunction: Secondary | ICD-10-CM | POA: Diagnosis not present

## 2019-02-23 DIAGNOSIS — I10 Essential (primary) hypertension: Secondary | ICD-10-CM | POA: Diagnosis not present

## 2019-02-23 DIAGNOSIS — Z79899 Other long term (current) drug therapy: Secondary | ICD-10-CM | POA: Diagnosis not present

## 2019-02-23 DIAGNOSIS — E039 Hypothyroidism, unspecified: Secondary | ICD-10-CM | POA: Diagnosis not present

## 2019-03-01 DIAGNOSIS — R413 Other amnesia: Secondary | ICD-10-CM | POA: Diagnosis not present

## 2019-03-01 DIAGNOSIS — E039 Hypothyroidism, unspecified: Secondary | ICD-10-CM | POA: Diagnosis not present

## 2019-03-01 DIAGNOSIS — Z79899 Other long term (current) drug therapy: Secondary | ICD-10-CM | POA: Diagnosis not present

## 2019-03-01 DIAGNOSIS — M150 Primary generalized (osteo)arthritis: Secondary | ICD-10-CM | POA: Diagnosis not present

## 2019-03-01 DIAGNOSIS — E291 Testicular hypofunction: Secondary | ICD-10-CM | POA: Diagnosis not present

## 2019-03-01 DIAGNOSIS — I10 Essential (primary) hypertension: Secondary | ICD-10-CM | POA: Diagnosis not present

## 2019-03-01 DIAGNOSIS — J45909 Unspecified asthma, uncomplicated: Secondary | ICD-10-CM | POA: Diagnosis not present

## 2019-03-01 DIAGNOSIS — F419 Anxiety disorder, unspecified: Secondary | ICD-10-CM | POA: Diagnosis not present

## 2019-03-01 DIAGNOSIS — G8929 Other chronic pain: Secondary | ICD-10-CM | POA: Diagnosis not present

## 2019-03-16 DIAGNOSIS — M7731 Calcaneal spur, right foot: Secondary | ICD-10-CM | POA: Diagnosis not present

## 2019-03-16 DIAGNOSIS — M722 Plantar fascial fibromatosis: Secondary | ICD-10-CM | POA: Diagnosis not present

## 2019-03-22 DIAGNOSIS — M79672 Pain in left foot: Secondary | ICD-10-CM | POA: Diagnosis not present

## 2019-03-22 DIAGNOSIS — M545 Low back pain: Secondary | ICD-10-CM | POA: Diagnosis not present

## 2019-03-22 DIAGNOSIS — M79671 Pain in right foot: Secondary | ICD-10-CM | POA: Diagnosis not present

## 2019-03-22 DIAGNOSIS — G5601 Carpal tunnel syndrome, right upper limb: Secondary | ICD-10-CM | POA: Diagnosis not present

## 2019-04-27 ENCOUNTER — Other Ambulatory Visit: Payer: Self-pay

## 2019-05-15 ENCOUNTER — Telehealth: Payer: Self-pay | Admitting: Orthopaedic Surgery

## 2019-05-15 NOTE — Telephone Encounter (Signed)
Patient left a voicemail stating he would like to schedule surgery for his left knee in January 2021.  Patient states he prefers a Thursday or Friday due to his schedule.  Patient requested a return.

## 2019-05-15 NOTE — Telephone Encounter (Signed)
This patient had not been evaluated for his left knee since March of 2019, do you want to see this patient first to discuss surgery?

## 2019-05-16 NOTE — Telephone Encounter (Signed)
Will need to make an appt sometime in Nov to schedule surgery

## 2019-05-16 NOTE — Telephone Encounter (Signed)
Spoke with patient. He wants to get an appointment to see Dr.Whitfield at 4pm one day in November. He said that he does not mind which day, just let him know. I was not sure how to handle this because it looks like his schedule for November has not been created yet. Thanks!

## 2019-05-31 DIAGNOSIS — Z0000 Encounter for general adult medical examination without abnormal findings: Secondary | ICD-10-CM | POA: Diagnosis not present

## 2019-05-31 DIAGNOSIS — Z7251 High risk heterosexual behavior: Secondary | ICD-10-CM | POA: Diagnosis not present

## 2019-05-31 DIAGNOSIS — I10 Essential (primary) hypertension: Secondary | ICD-10-CM | POA: Diagnosis not present

## 2019-05-31 DIAGNOSIS — E785 Hyperlipidemia, unspecified: Secondary | ICD-10-CM | POA: Diagnosis not present

## 2019-06-08 DIAGNOSIS — Z23 Encounter for immunization: Secondary | ICD-10-CM | POA: Diagnosis not present

## 2019-06-08 DIAGNOSIS — F419 Anxiety disorder, unspecified: Secondary | ICD-10-CM | POA: Diagnosis not present

## 2019-06-08 DIAGNOSIS — I10 Essential (primary) hypertension: Secondary | ICD-10-CM | POA: Diagnosis not present

## 2019-06-08 DIAGNOSIS — N401 Enlarged prostate with lower urinary tract symptoms: Secondary | ICD-10-CM | POA: Diagnosis not present

## 2019-06-08 DIAGNOSIS — E039 Hypothyroidism, unspecified: Secondary | ICD-10-CM | POA: Diagnosis not present

## 2019-06-08 DIAGNOSIS — R413 Other amnesia: Secondary | ICD-10-CM | POA: Diagnosis not present

## 2019-06-08 DIAGNOSIS — E291 Testicular hypofunction: Secondary | ICD-10-CM | POA: Diagnosis not present

## 2019-06-08 DIAGNOSIS — J45909 Unspecified asthma, uncomplicated: Secondary | ICD-10-CM | POA: Diagnosis not present

## 2019-06-08 DIAGNOSIS — M150 Primary generalized (osteo)arthritis: Secondary | ICD-10-CM | POA: Diagnosis not present

## 2019-06-27 DIAGNOSIS — M545 Low back pain: Secondary | ICD-10-CM | POA: Diagnosis not present

## 2019-06-27 DIAGNOSIS — M79671 Pain in right foot: Secondary | ICD-10-CM | POA: Diagnosis not present

## 2019-06-27 DIAGNOSIS — M5412 Radiculopathy, cervical region: Secondary | ICD-10-CM | POA: Diagnosis not present

## 2019-06-27 DIAGNOSIS — M79672 Pain in left foot: Secondary | ICD-10-CM | POA: Diagnosis not present

## 2019-08-03 DIAGNOSIS — E291 Testicular hypofunction: Secondary | ICD-10-CM | POA: Diagnosis not present

## 2019-08-09 ENCOUNTER — Other Ambulatory Visit: Payer: Self-pay

## 2019-08-09 ENCOUNTER — Ambulatory Visit (INDEPENDENT_AMBULATORY_CARE_PROVIDER_SITE_OTHER): Payer: Medicare Other

## 2019-08-09 ENCOUNTER — Encounter: Payer: Self-pay | Admitting: Orthopaedic Surgery

## 2019-08-09 ENCOUNTER — Ambulatory Visit (INDEPENDENT_AMBULATORY_CARE_PROVIDER_SITE_OTHER): Payer: Medicare Other | Admitting: Orthopaedic Surgery

## 2019-08-09 VITALS — BP 169/85 | HR 56 | Ht 66.0 in | Wt 164.0 lb

## 2019-08-09 DIAGNOSIS — M1712 Unilateral primary osteoarthritis, left knee: Secondary | ICD-10-CM | POA: Diagnosis not present

## 2019-08-09 DIAGNOSIS — G8929 Other chronic pain: Secondary | ICD-10-CM

## 2019-08-09 DIAGNOSIS — M25562 Pain in left knee: Secondary | ICD-10-CM

## 2019-08-09 NOTE — Progress Notes (Signed)
Office Visit Note   Patient: Larry [name]           Date of Birth: 1950-10-28           MRN: [id number] Visit Date: 08/09/2019              Requested by: Larry [name], Larry [name] [address] [place],  [place] [address] PCP: Larry Seashore, MD   Assessment & Plan: Visit Diagnoses:  1. Chronic pain of left knee   2. Primary osteoarthritis of left knee     Plan: End-stage osteoarthritis left knee.  [name] has reached a point where he has significant compromise of his activities and would like to proceed with total knee replacement.  He is several years status post right total knee replacement with an excellent response.  Will need clearance forms from his primary care physician.  Follow-Up Instructions: Return Schedule left total knee replacement.   Orders:  Orders Placed This Encounter  Procedures  . XR KNEE 3 VIEW LEFT   No orders of the defined types were placed in this encounter.     Procedures: No procedures performed   Clinical Data: No additional findings.   Subjective: Chief Complaint  Patient presents with  . Left Knee - Pain  Patient states that he has tentatively set up left total knee surgery for January 2021.  He is needing to discuss surgery and recovery time.  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 He states that he has left knee pain all of the time. He has difficulty walking up flight of stairs. 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 Since that back surgery he has difficulty putting weight on left leg.  He has Vicodin that he takes rarely if he needs to take edge off of pain.  HPI  Review of Systems   Objective: Vital Signs: BP (!) 169/85   Pulse (!) 56   Ht 5\' 6"  (1.676 m)   Wt 164 lb (74.4 kg)   BMI 26.47 kg/m   Physical Exam Constitutional:      Appearance: He is well-developed.  Eyes:     Pupils: Pupils are equal, round, and reactive to light.  Pulmonary:     Effort:  Pulmonary effort is normal.  Skin:    General: Skin is warm and dry.  Neurological:     Mental Status: He is alert and oriented to person, place, and time.  Psychiatric:        Behavior: Behavior normal.     Ortho Exam mild varus position of left knee with weightbearing.  Lacks just a few degrees to full extension and flex to at least 110 degrees without instability.  Knee was not hot red warm or swollen.  No effusion.  No distal edema.  Good pulses.  Straight leg raise negative.  Painless range of motion of both hips  Specialty Comments:  No specialty comments available.  Imaging: No results found.   PMFS History: Patient Active Problem List   Diagnosis Date Noted  . Essential hypertension 02/16/2018  . Sepsis due to skin infection ([place]) 02/16/2018  . Chronic pain 02/16/2018  . Cellulitis of left lower extremity   . Lumbar radiculopathy, chronic 01/24/2018  . Lumbar stenosis with neurogenic claudication 03/23/2017  . Hypothyroidism 07/25/2015  . Primary osteoarthritis of right knee 07/23/2015  . Primary osteoarthritis of knee 07/23/2015   Past Medical History:  Diagnosis Date  . Arthritis    "  knees; left shoulder" (04/12/2013)  . Asthma    "as a child" (04/12/2013)  . DDD (degenerative disc disease), cervical   . DDD (degenerative disc disease), lumbar   . Difficult intubation    2012   CERVICAL FUSION   . Headache(784.0)    VERAPAMIL FOR PREVENTION   . 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 History of chicken pox    as an adult  . Hypoglycemia    last episode 1 yr. ago, just gets jittery  . Hypothyroidism   . Lumbar stenosis   . OSA (obstructive sleep apnea)    "haven't wore mask in years; had OR; still snore" (04/12/2013)  . Urinary frequency     History reviewed. No pertinent family history.  Past Surgical History:  Procedure Laterality Date  . BACK SURGERY    .  CERVICAL FUSION  2002; 2012  . CYSTECTOMY    . INGUINAL HERNIA REPAIR Bilateral 1997  . KNEE ARTHROSCOPY Left ~ 1997; ~ 2005  . LUMBAR FUSION  2007; 04/11/2013  . PENILE DEBRIDEMENT  ~ 2011   X 3  FOR MRSA INFECTION    . SHOULDER ARTHROSCOPY Left ~ 2003  . TONSILLECTOMY  1990's  . TOTAL KNEE ARTHROPLASTY Right 07/23/2015  . TOTAL KNEE ARTHROPLASTY Right 07/23/2015   Procedure: TOTAL KNEE ARTHROPLASTY;  Surgeon: Garald Balding, MD;  Location: [facility];  Service: Orthopedics;  Laterality: Right;  . UVULOPALATOPHARYNGOPLASTY (UPPP)/TONSILLECTOMY/SEPTOPLASTY  1990's  . VASECTOMY     Social History   Occupational History  . Not on file  Tobacco Use  . Smoking status: Former Smoker    Packs/day: 0.50    Years: 15.00    Pack years: 7.50    Types: Cigarettes    Quit date: 10/14/1986    Years since quitting: 32.8  . Smokeless tobacco: Never Used  Substance and Sexual Activity  . Alcohol use: No  . Drug use: No  . Sexual activity: Yes

## 2019-08-21 ENCOUNTER — Telehealth: Payer: Self-pay | Admitting: Orthopaedic Surgery

## 2019-08-21 NOTE — Telephone Encounter (Signed)
Patient cleared from medical standpoint by Dr. Ashby Dawes. Patient to stop Aspirin 81mg  five days before left total knee arthroplasty with Dr. Durward Fortes on 10-03-2019

## 2019-08-27 ENCOUNTER — Other Ambulatory Visit: Payer: Self-pay

## 2019-08-27 ENCOUNTER — Encounter (HOSPITAL_COMMUNITY): Payer: Self-pay | Admitting: Emergency Medicine

## 2019-08-27 ENCOUNTER — Emergency Department (HOSPITAL_COMMUNITY)
Admission: EM | Admit: 2019-08-27 | Discharge: 2019-08-27 | Disposition: A | Discharge status: Home / Self Care | Payer: Medicare Other | Source: Home / Self Care | Attending: Emergency Medicine | Admitting: Emergency Medicine

## 2019-08-27 DIAGNOSIS — I10 Essential (primary) hypertension: Secondary | ICD-10-CM | POA: Insufficient documentation

## 2019-08-27 DIAGNOSIS — G4733 Obstructive sleep apnea (adult) (pediatric): Secondary | ICD-10-CM | POA: Diagnosis not present

## 2019-08-27 DIAGNOSIS — L03119 Cellulitis of unspecified part of limb: Secondary | ICD-10-CM

## 2019-08-27 DIAGNOSIS — Z7982 Long term (current) use of aspirin: Secondary | ICD-10-CM | POA: Insufficient documentation

## 2019-08-27 DIAGNOSIS — Z87891 Personal history of nicotine dependence: Secondary | ICD-10-CM | POA: Insufficient documentation

## 2019-08-27 DIAGNOSIS — G8929 Other chronic pain: Secondary | ICD-10-CM | POA: Diagnosis not present

## 2019-08-27 DIAGNOSIS — Z79899 Other long term (current) drug therapy: Secondary | ICD-10-CM | POA: Insufficient documentation

## 2019-08-27 DIAGNOSIS — L02414 Cutaneous abscess of left upper limb: Secondary | ICD-10-CM | POA: Insufficient documentation

## 2019-08-27 DIAGNOSIS — L0291 Cutaneous abscess, unspecified: Secondary | ICD-10-CM

## 2019-08-27 DIAGNOSIS — L03114 Cellulitis of left upper limb: Secondary | ICD-10-CM | POA: Insufficient documentation

## 2019-08-27 DIAGNOSIS — E039 Hypothyroidism, unspecified: Secondary | ICD-10-CM | POA: Insufficient documentation

## 2019-08-27 DIAGNOSIS — Z20828 Contact with and (suspected) exposure to other viral communicable diseases: Secondary | ICD-10-CM | POA: Diagnosis not present

## 2019-08-27 DIAGNOSIS — B9562 Methicillin resistant Staphylococcus aureus infection as the cause of diseases classified elsewhere: Secondary | ICD-10-CM | POA: Diagnosis not present

## 2019-08-27 LAB — CBC WITH DIFFERENTIAL/PLATELET
Abs Immature Granulocytes: 0.05 10*3/uL (ref 0.00–0.07)
Basophils Absolute: 0 10*3/uL (ref 0.0–0.1)
Basophils Relative: 0 %
Eosinophils Absolute: 0.1 10*3/uL (ref 0.0–0.5)
Eosinophils Relative: 2 %
HCT: 47.4 % (ref 39.0–52.0)
Hemoglobin: 16.9 g/dL (ref 13.0–17.0)
Immature Granulocytes: 1 %
Lymphocytes Relative: 12 %
Lymphs Abs: 1 10*3/uL (ref 0.7–4.0)
MCH: 34.7 pg — ABNORMAL HIGH (ref 26.0–34.0)
MCHC: 35.7 g/dL (ref 30.0–36.0)
MCV: 97.3 fL (ref 80.0–100.0)
Monocytes Absolute: 0.5 10*3/uL (ref 0.1–1.0)
Monocytes Relative: 6 %
Neutro Abs: 7 10*3/uL (ref 1.7–7.7)
Neutrophils Relative %: 79 %
Platelets: 154 10*3/uL (ref 150–400)
RBC: 4.87 MIL/uL (ref 4.22–5.81)
RDW: 13.7 % (ref 11.5–15.5)
WBC: 8.7 10*3/uL (ref 4.0–10.5)
nRBC: 0 % (ref 0.0–0.2)

## 2019-08-27 LAB — COMPREHENSIVE METABOLIC PANEL
ALT: 29 U/L (ref 0–44)
AST: 28 U/L (ref 15–41)
Albumin: 3.9 g/dL (ref 3.5–5.0)
Alkaline Phosphatase: 45 U/L (ref 38–126)
Anion gap: 10 (ref 5–15)
BUN: 14 mg/dL (ref 8–23)
CO2: 25 mmol/L (ref 22–32)
Calcium: 9 mg/dL (ref 8.9–10.3)
Chloride: 104 mmol/L (ref 98–111)
Creatinine, Ser: 0.89 mg/dL (ref 0.61–1.24)
GFR calc Af Amer: >60 mL/min (ref >60)
GFR calc non Af Amer: >60 mL/min (ref >60)
Glucose, Bld: 105 mg/dL — ABNORMAL HIGH (ref 70–99)
Potassium: 4.3 mmol/L (ref 3.5–5.1)
Sodium: 139 mmol/L (ref 135–145)
Total Bilirubin: 1.5 mg/dL — ABNORMAL HIGH (ref 0.3–1.2)
Total Protein: 6.4 g/dL — ABNORMAL LOW (ref 6.5–8.1)

## 2019-08-27 MED ORDER — HYDROCODONE-ACETAMINOPHEN 5-325 MG PO TABS: 1.0000 | ORAL_TABLET | ORAL | 0 refills | Status: DC | PRN

## 2019-08-27 MED ORDER — CLINDAMYCIN HCL 300 MG PO CAPS: 300.0000 mg | ORAL_CAPSULE | Freq: Four times a day (QID) | ORAL | 0 refills | Status: DC

## 2019-08-27 MED ORDER — VANCOMYCIN HCL IN DEXTROSE 1-5 GM/200ML-% IV SOLN
1000.0000 mg | Freq: Once | INTRAVENOUS | Status: AC
  Administered 2019-08-27: 1000 mg via INTRAVENOUS
  Filled 2019-08-27: qty 200

## 2019-08-27 MED ORDER — LIDOCAINE HCL (PF) 1 % IJ SOLN
5.0000 mL | Freq: Once | INTRAMUSCULAR | Status: AC
  Administered 2019-08-27: 16:00:00 5 mL
  Filled 2019-08-27: qty 5

## 2019-08-27 NOTE — ED Provider Notes (Signed)
[facility] EMERGENCY DEPARTMENT Provider Note   CSN: [id number] Arrival date & time: 08/27/19  1253     History   Chief Complaint Chief Complaint  Patient presents with  . Arm Pain    HPI Larry [name] is a 68 y.o. male.     HPI   He complains of worsening pain swelling and redness after attempting to pop a abscess off his left elbow, several days ago.  He denies fever, chills, nausea, vomiting, weakness dizziness.  He is worried about worsening, recurrent, MRSA infection.  There are no other known modifying factors.  Past Medical History:  Diagnosis Date  . Arthritis    "knees; left shoulder" (04/12/2013)  . Asthma    "as a child" (04/12/2013)  . DDD (degenerative disc disease), cervical   . DDD (degenerative disc disease), lumbar   . Difficult intubation    2012   CERVICAL FUSION   . Headache(784.0)    VERAPAMIL FOR PREVENTION   . 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 History of chicken pox    as an adult  . Hypoglycemia    last episode 1 yr. ago, just gets jittery  . Hypothyroidism   . Lumbar stenosis   . OSA (obstructive sleep apnea)    "haven't wore mask in years; had OR; still snore" (04/12/2013)  . Urinary frequency     Patient Active Problem List   Diagnosis Date Noted  . Essential hypertension 02/16/2018  . Sepsis due to skin infection ([place]) 02/16/2018  . Chronic pain 02/16/2018  . Cellulitis of left lower extremity   . Lumbar radiculopathy, chronic 01/24/2018  . Lumbar stenosis with neurogenic claudication 03/23/2017  . Hypothyroidism 07/25/2015  . Primary osteoarthritis of right knee 07/23/2015  . Primary osteoarthritis of knee 07/23/2015    Past Surgical History:  Procedure Laterality Date  . BACK SURGERY    . CERVICAL FUSION  2002; 2012  . CYSTECTOMY    . INGUINAL HERNIA REPAIR Bilateral 1997  . KNEE ARTHROSCOPY Left ~ 1997;  ~ 2005  . LUMBAR FUSION  2007; 04/11/2013  . PENILE DEBRIDEMENT  ~ 2011   X 3  FOR MRSA INFECTION    . SHOULDER ARTHROSCOPY Left ~ 2003  . TONSILLECTOMY  1990's  . TOTAL KNEE ARTHROPLASTY Right 07/23/2015  . TOTAL KNEE ARTHROPLASTY Right 07/23/2015   Procedure: TOTAL KNEE ARTHROPLASTY;  Surgeon: Garald Balding, MD;  Location: [facility];  Service: Orthopedics;  Laterality: Right;  . UVULOPALATOPHARYNGOPLASTY (UPPP)/TONSILLECTOMY/SEPTOPLASTY  1990's  . VASECTOMY          Home Medications    Prior to Admission medications   Medication Sig Start Date End Date Taking? Authorizing Provider  aspirin EC 81 MG tablet Take 81 mg by mouth daily.   Yes [provider]  furosemide (LASIX) 40 MG tablet Take 40 mg by mouth daily as needed for edema.    Yes [provider]  latanoprost (XALATAN) 0.005 % ophthalmic solution Place 1 drop into both eyes at bedtime. 03/13/17  Yes [provider]  levothyroxine (SYNTHROID) 200 MCG tablet Take 200 mcg by mouth daily before breakfast.    Yes [provider]  LORazepam (ATIVAN) 0.5 MG tablet Take 1 mg by mouth at bedtime.  03/14/17  Yes [provider]  losartan (COZAAR) 50 MG tablet Take 50 mg by mouth daily.  07/17/19  Yes [provider]  meloxicam (MOBIC) 15 MG tablet Take 15 mg by mouth daily.  09/27/18  Yes [provider]  Multiple Vitamins-Minerals (MULTIVITAMIN PO) Take 1 tablet by mouth daily.   Yes [provider]  Oxymetazoline HCl (AFRIN NASAL SPRAY NA) Place 2 sprays into both nostrils 2 (two) times daily as needed (nasal congestion).   Yes [provider]  Phosphatidylserine-DHA-EPA (VAYACOG) 100-19.5-6.5 MG CAPS Take 1 capsule by mouth daily.   Yes [provider]  Sildenafil Citrate (VIAGRA PO) Take 30 mg by mouth daily as needed (for erectile dysfunction). Purchased from [employer]--dose indicated on website as 30 mg   Yes [provider]   tamsulosin (FLOMAX) 0.4 MG CAPS capsule Take 0.4 mg by mouth daily.   Yes [provider]  testosterone cypionate (DEPOTESTOSTERONE CYPIONATE) 200 MG/ML injection Inject 100 mg into the muscle every 14 (fourteen) days. 02/07/18  Yes [provider]  verapamil (CALAN-SR) 180 MG CR tablet Take 180 mg by mouth daily. 12/31/16  Yes [provider]  clindamycin (CLEOCIN) 300 MG capsule Take 1 capsule (300 mg total) by mouth 4 (four) times daily. X 7 days 08/27/19   Daleen Bo, MD  HYDROcodone-acetaminophen [facility]) 5-325 MG tablet Take 1 tablet by mouth every 4 (four) hours as needed for moderate pain. 08/27/19   Daleen Bo, MD  HYDROmorphone (DILAUDID) 2 MG tablet Take 1 tablet (2 mg total) by mouth every 4 (four) hours as needed for severe pain. Patient not taking: Reported on 12/09/2018 01/25/18   Kristeen Miss, MD    Family History No family history on file.  Social History Social History   Tobacco Use  . Smoking status: Former Smoker    Packs/day: 0.50    Years: 15.00    Pack years: 7.50    Types: Cigarettes    Quit date: 10/14/1986    Years since quitting: 32.8  . Smokeless tobacco: Never Used  Substance Use Topics  . Alcohol use: No  . Drug use: No     Allergies   Patient has no known allergies.   Review of Systems Review of Systems  All other systems reviewed and are negative.    Physical Exam Updated Vital Signs BP (!) 178/88 (BP Location: Right Arm)   Pulse 72   Temp 99.5 F (37.5 C) (Oral)   Resp 16   SpO2 100%   Physical Exam Vitals signs and nursing note reviewed.  Constitutional:      Appearance: He is well-developed.  HENT:     Head: Normocephalic and atraumatic.     Right Ear: External ear normal.     Left Ear: External ear normal.  Eyes:     Conjunctiva/sclera: Conjunctivae normal.     Pupils: Pupils are equal, round, and reactive to light.  Neck:     Musculoskeletal: Normal range of motion and neck supple.      Trachea: Phonation normal.  Cardiovascular:     Rate and Rhythm: Normal rate.  Pulmonary:     Effort: Pulmonary effort is normal.  Musculoskeletal: Normal range of motion.     Comments: Left elbow with mild redness, all posterior, above and below extension crease.  Central line this is a 2 cm indurated area with central opening, consistent with partially drained abscess.  Normal elbow motion both flexion, extension and pronation.  Neurovascular intact distally in the left hand.  Skin:    General: Skin is warm and dry.  Neurological:     Mental  Status: He is alert and oriented to person, place, and time.     Cranial Nerves: No cranial nerve deficit.     Sensory: No sensory deficit.     Motor: No abnormal muscle tone.     Coordination: Coordination normal.  Psychiatric:        Mood and Affect: Mood normal.        Behavior: Behavior normal.        Thought Content: Thought content normal.        Judgment: Judgment normal.      ED Treatments / Results  Labs (all labs ordered are listed, but only abnormal results are displayed) Labs Reviewed  COMPREHENSIVE METABOLIC PANEL - Abnormal; Notable for the following components:      Result Value   Glucose, Bld 105 (*)    Total Protein 6.4 (*)    Total Bilirubin 1.5 (*)    All other components within normal limits  CBC WITH DIFFERENTIAL/PLATELET - Abnormal; Notable for the following components:   MCH 34.7 (*)    All other components within normal limits  AEROBIC CULTURE (SUPERFICIAL SPECIMEN)    EKG EKG Interpretation  Date/Time:  Sunday August 27 2019 18:32:17 EST Ventricular Rate:  101 PR Interval:    QRS Duration: 114 QT Interval:  378 QTC Calculation: 490 R Axis:   -21 Text Interpretation: Sinus tachycardia with irregular rate Borderline intraventricular conduction delay Borderline prolonged QT interval Since last tracing rate faster and QT is longer Confirmed by Daleen Bo [phone]) on 08/27/2019 8:52:28 PM    Radiology No results found.  Procedures .Marland KitchenIncision and Drainage  Date/Time: 08/27/2019 6:01 PM Performed by: Daleen Bo, MD Authorized by: Daleen Bo, MD   Consent:    Consent obtained:  Verbal   Consent given by:  Patient   Risks discussed:  Incomplete drainage, pain and infection   Alternatives discussed:  No treatment Location:    Type:  Abscess   Size:  2 x 2 cm   Location: Left elbow, dorsum. Pre-procedure details:    Skin preparation:  Betadine Anesthesia (see MAR for exact dosages):    Anesthesia method:  Local infiltration   Local anesthetic:  Lidocaine 1% w/o epi Procedure type:    Complexity:  Simple Procedure details:    Needle aspiration: no     Incision types:  Single straight   Incision depth:  Subcutaneous   Scalpel blade:  11   Wound management:  Probed and deloculated   Drainage:  Bloody   Drainage amount:  Scant   Wound treatment:  Wound left open   Packing materials:  None Post-procedure details:    Patient tolerance of procedure:  Tolerated well, no immediate complications Comments:     Wound culture obtained after incision.   (including critical care time)  Medications Ordered in ED Medications  vancomycin (VANCOCIN) IVPB 1000 mg/200 mL premix (0 mg Intravenous Stopped 08/27/19 1740)  lidocaine (PF) (XYLOCAINE) 1 % injection 5 mL (5 mLs Infiltration Given 08/27/19 1610)     Initial Impression / Assessment and Plan / ED Course  I have reviewed the triage vital signs and the nursing notes.  Pertinent labs & imaging results that were available during my care of the patient were reviewed by me and considered in my medical decision making (see chart for details).  Clinical Course as of Aug 27 2051  Nancy Fetter Aug 27, 2019  1524 Normal except glucose high, total protein low, total bilirubin high  Comprehensive metabolic panel(!) [EW]  1524 Normal  CBC with Differential(!) [EW]    Clinical Course User Index [EW] Daleen Bo, MD         Patient Vitals for the past 24 hrs:  BP Temp Temp src Pulse Resp SpO2  08/27/19 1602 (!) 173/82 - - 68 - 100 %  08/27/19 1545 (!) 144/77 - - 63 18 98 %  08/27/19 1530 (!) 149/80 - - 63 - 100 %  08/27/19 1500 (!) 141/89 - - 70 - 100 %  08/27/19 1445 136/73 - - 64 - 98 %  08/27/19 1430 139/73 - - 61 - 97 %  08/27/19 1415 140/80 - - 64 - 98 %  08/27/19 1400 139/72 - - 65 18 100 %  08/27/19 1347 - - - 68 - 100 %  08/27/19 1346 (!) 161/97 - - 71 - 99 %  08/27/19 1256 (!) 142/78 99.5 F (37.5 C) Oral 70 16 99 %    6:03 PM Reevaluation with update and discussion. After initial assessment and treatment, an updated evaluation reveals he is comfortable after IV, findings discussed and questions answered. [place] Decision Making:, Superficial with mild surrounding cellulitis.  Doubt joint infection, evidence for ascending infection.  Possible  MRSA vs.streptococcal infection.  CRITICAL CARE- No Performed by: Daleen Bo  .......Marland KitchenNursing Notes Reviewed/ Care Coordinated Applicable Imaging Reviewed Interpretation of Laboratory Data incorporated into ED treatment  The patient appears reasonably screened and/or stabilized for discharge and I doubt any other medical condition or other [facility] requiring further screening, evaluation, or treatment in the ED at this time prior to discharge.  Plan: Home Medications-continue current medications Home Treatments-warm soaks 3 times daily; return here if the recommended treatment, does not improve the symptoms; Recommended follow up-PCP or return here as needed  Final Clinical Impressions(s) / ED Diagnoses   Final diagnoses:  Abscess  Cellulitis of elbow    ED Discharge Orders         Ordered    clindamycin (CLEOCIN) 300 MG capsule  4 times daily,   Status:  Discontinued     08/27/19 1805    HYDROcodone-acetaminophen ([place]) 5-325 MG tablet  Every 4 hours PRN     08/27/19 1805    clindamycin (CLEOCIN) 300 MG capsule  4 times  daily     08/27/19 1809           Daleen Bo, MD 08/28/19 1237

## 2019-08-27 NOTE — Discharge Instructions (Addendum)
Soak the wound of your left elbow in warm water 3 times a day for 30 minutes, until healed.  You will need to keep a light bandage on the wound after cleansing.  Start the antibiotic prescription this evening.  Return here or see your doctor for problems.

## 2019-08-27 NOTE — ED Triage Notes (Signed)
Patient c/o small bump to left elbow/ forearm area onset of 2 days ago. States he put pressure on it and it popped a little and has been oozing. Patient now having tenderness to left arm.

## 2019-08-28 ENCOUNTER — Inpatient Hospital Stay (HOSPITAL_COMMUNITY)
Admission: EM | Admit: 2019-08-28 | Discharge: 2019-09-01 | DRG: 581 | Disposition: A | Discharge status: Home / Self Care | Payer: Medicare Other | Attending: Internal Medicine | Admitting: Internal Medicine

## 2019-08-28 ENCOUNTER — Emergency Department (HOSPITAL_COMMUNITY): Payer: Medicare Other

## 2019-08-28 ENCOUNTER — Encounter (HOSPITAL_COMMUNITY): Payer: Self-pay | Admitting: Emergency Medicine

## 2019-08-28 ENCOUNTER — Other Ambulatory Visit: Payer: Self-pay

## 2019-08-28 DIAGNOSIS — E039 Hypothyroidism, unspecified: Secondary | ICD-10-CM | POA: Diagnosis present

## 2019-08-28 DIAGNOSIS — G894 Chronic pain syndrome: Secondary | ICD-10-CM

## 2019-08-28 DIAGNOSIS — I10 Essential (primary) hypertension: Secondary | ICD-10-CM | POA: Diagnosis present

## 2019-08-28 DIAGNOSIS — Z87891 Personal history of nicotine dependence: Secondary | ICD-10-CM

## 2019-08-28 DIAGNOSIS — L03114 Cellulitis of left upper limb: Principal | ICD-10-CM | POA: Diagnosis present

## 2019-08-28 DIAGNOSIS — Z20828 Contact with and (suspected) exposure to other viral communicable diseases: Secondary | ICD-10-CM | POA: Diagnosis present

## 2019-08-28 DIAGNOSIS — N400 Enlarged prostate without lower urinary tract symptoms: Secondary | ICD-10-CM | POA: Diagnosis present

## 2019-08-28 DIAGNOSIS — B9562 Methicillin resistant Staphylococcus aureus infection as the cause of diseases classified elsewhere: Secondary | ICD-10-CM | POA: Diagnosis not present

## 2019-08-28 DIAGNOSIS — L03119 Cellulitis of unspecified part of limb: Secondary | ICD-10-CM | POA: Diagnosis present

## 2019-08-28 DIAGNOSIS — Z03818 Encounter for observation for suspected exposure to other biological agents ruled out: Secondary | ICD-10-CM | POA: Diagnosis not present

## 2019-08-28 DIAGNOSIS — G4733 Obstructive sleep apnea (adult) (pediatric): Secondary | ICD-10-CM | POA: Diagnosis not present

## 2019-08-28 DIAGNOSIS — R0789 Other chest pain: Secondary | ICD-10-CM | POA: Diagnosis not present

## 2019-08-28 DIAGNOSIS — Z96651 Presence of right artificial knee joint: Secondary | ICD-10-CM | POA: Diagnosis present

## 2019-08-28 DIAGNOSIS — G8929 Other chronic pain: Secondary | ICD-10-CM | POA: Diagnosis not present

## 2019-08-28 DIAGNOSIS — Z981 Arthrodesis status: Secondary | ICD-10-CM

## 2019-08-28 LAB — CBC WITH DIFFERENTIAL/PLATELET
Abs Immature Granulocytes: 0.08 10*3/uL — ABNORMAL HIGH (ref 0.00–0.07)
Basophils Absolute: 0 10*3/uL (ref 0.0–0.1)
Basophils Relative: 0 %
Eosinophils Absolute: 0.2 10*3/uL (ref 0.0–0.5)
Eosinophils Relative: 1 %
HCT: 49.8 % (ref 39.0–52.0)
Hemoglobin: 17.3 g/dL — ABNORMAL HIGH (ref 13.0–17.0)
Immature Granulocytes: 1 %
Lymphocytes Relative: 7 %
Lymphs Abs: 0.9 10*3/uL (ref 0.7–4.0)
MCH: 34.1 pg — ABNORMAL HIGH (ref 26.0–34.0)
MCHC: 34.7 g/dL (ref 30.0–36.0)
MCV: 98.2 fL (ref 80.0–100.0)
Monocytes Absolute: 0.8 10*3/uL (ref 0.1–1.0)
Monocytes Relative: 7 %
Neutro Abs: 9.8 10*3/uL — ABNORMAL HIGH (ref 1.7–7.7)
Neutrophils Relative %: 84 %
Platelets: 151 10*3/uL (ref 150–400)
RBC: 5.07 MIL/uL (ref 4.22–5.81)
RDW: 13.8 % (ref 11.5–15.5)
WBC: 11.7 10*3/uL — ABNORMAL HIGH (ref 4.0–10.5)
nRBC: 0 % (ref 0.0–0.2)

## 2019-08-28 LAB — COMPREHENSIVE METABOLIC PANEL
ALT: 25 U/L (ref 0–44)
AST: 27 U/L (ref 15–41)
Albumin: 3.7 g/dL (ref 3.5–5.0)
Alkaline Phosphatase: 49 U/L (ref 38–126)
Anion gap: 10 (ref 5–15)
BUN: 12 mg/dL (ref 8–23)
CO2: 26 mmol/L (ref 22–32)
Calcium: 9 mg/dL (ref 8.9–10.3)
Chloride: 102 mmol/L (ref 98–111)
Creatinine, Ser: 0.96 mg/dL (ref 0.61–1.24)
GFR calc Af Amer: >60 mL/min (ref >60)
GFR calc non Af Amer: >60 mL/min (ref >60)
Glucose, Bld: 118 mg/dL — ABNORMAL HIGH (ref 70–99)
Potassium: 4.5 mmol/L (ref 3.5–5.1)
Sodium: 138 mmol/L (ref 135–145)
Total Bilirubin: 1.8 mg/dL — ABNORMAL HIGH (ref 0.3–1.2)
Total Protein: 6.5 g/dL (ref 6.5–8.1)

## 2019-08-28 LAB — PROTIME-INR
INR: 1.1 (ref 0.8–1.2)
Prothrombin Time: 14 seconds (ref 11.4–15.2)

## 2019-08-28 LAB — LACTIC ACID, PLASMA: Lactic Acid, Venous: 1.4 mmol/L (ref 0.5–1.9)

## 2019-08-28 LAB — SARS CORONAVIRUS 2 (TAT 6-24 HRS): SARS Coronavirus 2: NEGATIVE

## 2019-08-28 LAB — HIV ANTIBODY (ROUTINE TESTING W REFLEX): HIV Screen 4th Generation wRfx: NONREACTIVE

## 2019-08-28 LAB — TSH: TSH: 1.567 u[IU]/mL (ref 0.350–4.500)

## 2019-08-28 MED ORDER — ENOXAPARIN SODIUM 40 MG/0.4ML ~~LOC~~ SOLN
40.0000 mg | SUBCUTANEOUS | Status: DC
  Administered 2019-08-28 – 2019-09-01 (×5): 40 mg via SUBCUTANEOUS
  Filled 2019-08-28 (×5): qty 0.4

## 2019-08-28 MED ORDER — VERAPAMIL HCL ER 180 MG PO TBCR
180.0000 mg | EXTENDED_RELEASE_TABLET | Freq: Every day | ORAL | Status: DC
  Administered 2019-08-29 – 2019-09-01 (×4): 180 mg via ORAL
  Filled 2019-08-28 (×5): qty 1

## 2019-08-28 MED ORDER — ASPIRIN EC 81 MG PO TBEC
81.0000 mg | DELAYED_RELEASE_TABLET | Freq: Every day | ORAL | Status: DC
  Administered 2019-08-29 – 2019-09-01 (×4): 81 mg via ORAL
  Filled 2019-08-28 (×5): qty 1

## 2019-08-28 MED ORDER — LORAZEPAM 1 MG PO TABS
1.0000 mg | ORAL_TABLET | Freq: Every day | ORAL | Status: DC
  Administered 2019-08-28 – 2019-08-31 (×4): 1 mg via ORAL
  Filled 2019-08-28 (×4): qty 1

## 2019-08-28 MED ORDER — LOSARTAN POTASSIUM 50 MG PO TABS
50.0000 mg | ORAL_TABLET | Freq: Every day | ORAL | Status: DC
  Administered 2019-08-29 – 2019-09-01 (×4): 50 mg via ORAL
  Filled 2019-08-28 (×5): qty 1

## 2019-08-28 MED ORDER — LEVOTHYROXINE SODIUM 100 MCG PO TABS
200.0000 ug | ORAL_TABLET | Freq: Every day | ORAL | Status: DC
  Administered 2019-08-29 – 2019-09-01 (×4): 200 ug via ORAL
  Filled 2019-08-28 (×4): qty 2

## 2019-08-28 MED ORDER — ACETAMINOPHEN 650 MG RE SUPP: 650.0000 mg | Freq: Four times a day (QID) | RECTAL | Status: DC | PRN

## 2019-08-28 MED ORDER — ONDANSETRON HCL 4 MG PO TABS: 4.0000 mg | ORAL_TABLET | Freq: Four times a day (QID) | ORAL | Status: DC | PRN

## 2019-08-28 MED ORDER — ACETAMINOPHEN 325 MG PO TABS: 650.0000 mg | ORAL_TABLET | Freq: Four times a day (QID) | ORAL | Status: DC | PRN

## 2019-08-28 MED ORDER — VANCOMYCIN HCL IN DEXTROSE 750-5 MG/150ML-% IV SOLN
750.0000 mg | Freq: Two times a day (BID) | INTRAVENOUS | Status: DC
  Administered 2019-08-28 – 2019-09-01 (×9): 750 mg via INTRAVENOUS
  Filled 2019-08-28 (×11): qty 150

## 2019-08-28 MED ORDER — ONDANSETRON HCL 4 MG/2ML IJ SOLN: 4.0000 mg | Freq: Four times a day (QID) | INTRAMUSCULAR | Status: DC | PRN

## 2019-08-28 MED ORDER — TAMSULOSIN HCL 0.4 MG PO CAPS
0.4000 mg | ORAL_CAPSULE | Freq: Every day | ORAL | Status: DC
  Administered 2019-08-29 – 2019-09-01 (×4): 0.4 mg via ORAL
  Filled 2019-08-28 (×5): qty 1

## 2019-08-28 MED ORDER — DOCUSATE SODIUM 100 MG PO CAPS
100.0000 mg | ORAL_CAPSULE | Freq: Two times a day (BID) | ORAL | Status: DC
  Administered 2019-08-28 – 2019-09-01 (×9): 100 mg via ORAL
  Filled 2019-08-28 (×9): qty 1

## 2019-08-28 MED ORDER — MELOXICAM 7.5 MG PO TABS
15.0000 mg | ORAL_TABLET | Freq: Every day | ORAL | Status: DC
  Administered 2019-08-29 – 2019-09-01 (×4): 15 mg via ORAL
  Filled 2019-08-28 (×5): qty 2

## 2019-08-28 MED ORDER — LATANOPROST 0.005 % OP SOLN
1.0000 [drp] | Freq: Every day | OPHTHALMIC | Status: DC
  Administered 2019-08-28 – 2019-08-31 (×4): 1 [drp] via OPHTHALMIC
  Filled 2019-08-28: qty 2.5

## 2019-08-28 MED ORDER — HYDROCODONE-ACETAMINOPHEN 5-325 MG PO TABS
1.0000 | ORAL_TABLET | ORAL | Status: DC | PRN
  Administered 2019-08-29: 1 via ORAL
  Filled 2019-08-28 (×2): qty 1

## 2019-08-28 NOTE — Progress Notes (Addendum)
Pharmacy Antibiotic Note  Larry [name] is a 68 y.o. male represented on 08/28/2019 after being seen in the ED yesterday for L forearm cellulitis/abscess which was drained in the ED. In the ED yesterday patient received vancomycin 1000mg  and was discharged on clindamycin. Today patient presented with increased swelling and redness of forearm.  Pharmacy has been consulted for vancomycin dosing for cellulitis. Culture on 08/27/2019 is pending with gram stain indicating few GPC in pairs and clusters. Scr of 0.96 and current CrCl of 67 ml/min is approximately at the patient's baseline of Scr 0.9. WBC 11.7. Lactic acid 1.4. Afebrile  Vancomycin 750 mg IV Q 12 hrs. Goal AUC 400-550. Expected AUC: 464 SCr used: 0.96  Plan: Start vancomycin 750mg  IV q12h  Follow up results of cultures on 11/29 Monitor renal function, cultures, and clinical progression   Weight: 164 lb 0.4 oz (74.4 kg)  Temp (24hrs), Avg:98.5 F (36.9 C), Min:97.5 F (36.4 C), Max:99.5 F (37.5 C)  Recent Labs  Lab 08/27/19 1308 08/28/19 0716  WBC 8.7 11.7*  CREATININE 0.89 0.96  LATICACIDVEN  --  1.4    Estimated Creatinine Clearance: 67.4 mL/min (by C-G formula based on SCr of 0.96 mg/dL).    No Known Allergies  Antimicrobials this admission: Vancomycin 11/30 >>  Dose adjustments this admission: N/A  Microbiology results: 11/30 Bcx:  11/30 Abscess gram stain: few GPC in pairs and clusters  11/30 Abscess culture:   Thank you for allowing pharmacy to be a part of this patient's care.  Cristela Felt, PharmD PGY1 Pharmacy Resident [employer]: [phone]  08/28/2019 9:01 AM

## 2019-08-28 NOTE — Consult Note (Signed)
Reason for Consult:Left FA cellulitis Referring Physician: Delphine [name] is an 68 y.o. male.  HPI: Larry [name] come in with a 1 week hx/o pain, swelling, and redness of his left arm. He's had a small lesion over the past year that has occasionally swollen. The course of this time has waxed and waned with various degrees of redness, swelling, and pain. He did manipulate the area and was able to get a very small amount of pus out of it. He went to ED yesterday and had an I&D that only got a small amount of purulence (provider note not complete yet). When he woke up this morning the redness and swelling were worse and he came back in for evaluation. Orthopedic surgery was consulted by EDP with plans for medical admission. 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 He is RHD and works at [employer].  Past Medical History:  Diagnosis Date  . Arthritis    "knees; left shoulder" (04/12/2013)  . Asthma    "as a child" (04/12/2013)  . DDD (degenerative disc disease), cervical   . DDD (degenerative disc disease), lumbar   . Difficult intubation    2012   CERVICAL FUSION   . Headache(784.0)    VERAPAMIL FOR PREVENTION   . 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 History of chicken pox    as an adult  . Hypoglycemia    last episode 1 yr. ago, just gets jittery  . Hypothyroidism   . Lumbar stenosis   . OSA (obstructive sleep apnea)    "haven't wore mask in years; had OR; still snore" (04/12/2013)  . Urinary frequency     Past Surgical History:  Procedure Laterality Date  . BACK SURGERY    . CERVICAL FUSION  2002; 2012  . CYSTECTOMY    . INGUINAL HERNIA REPAIR Bilateral 1997  . KNEE ARTHROSCOPY Left ~ 1997; ~ 2005  . LUMBAR FUSION  2007; 04/11/2013  . PENILE DEBRIDEMENT  ~ 2011   X 3  FOR MRSA INFECTION    . SHOULDER ARTHROSCOPY Left ~ 2003  .  TONSILLECTOMY  1990's  . TOTAL KNEE ARTHROPLASTY Right 07/23/2015  . TOTAL KNEE ARTHROPLASTY Right 07/23/2015   Procedure: TOTAL KNEE ARTHROPLASTY;  Surgeon: Garald Balding, MD;  Location: [facility];  Service: Orthopedics;  Laterality: Right;  . UVULOPALATOPHARYNGOPLASTY (UPPP)/TONSILLECTOMY/SEPTOPLASTY  1990's  . VASECTOMY      No family history on file.  Social History:  reports that he quit smoking about 32 years ago. His smoking use included cigarettes. He has a 7.50 pack-year smoking history. He has never used smokeless tobacco. He reports that he does not drink alcohol or use drugs.  Allergies: No Known Allergies  Medications: I have reviewed the patient's current medications.  Results for orders placed or performed during the hospital encounter of 08/28/19 (from the past 48 hour(s))  Comprehensive metabolic panel     Status: Abnormal   Collection Time: 08/28/19  7:16 AM  Result Value Ref Range   Sodium 138 135 - 145 mmol/L   Potassium 4.5 3.5 - 5.1 mmol/L   Chloride 102 98 - 111 mmol/L   CO2 26 22 - 32 mmol/L   Glucose, Bld 118 (H) 70 - 99 mg/dL   BUN 12 8 - 23 mg/dL   Creatinine, Ser 0.96  0.61 - 1.24 mg/dL   Calcium 9.0 8.9 - 10.3 mg/dL   Total Protein 6.5 6.5 - 8.1 g/dL   Albumin 3.7 3.5 - 5.0 g/dL   AST 27 15 - 41 U/L   ALT 25 0 - 44 U/L   Alkaline Phosphatase 49 38 - 126 U/L   Total Bilirubin 1.8 (H) 0.3 - 1.2 mg/dL   GFR calc non Af Amer >60 >60 mL/min   GFR calc Af Amer >60 >60 mL/min   Anion gap 10 5 - 15    Comment: Performed at [facility] [address], [place], [place] [address]  Lactic acid, plasma     Status: None   Collection Time: 08/28/19  7:16 AM  Result Value Ref Range   Lactic Acid, Venous 1.4 0.5 - 1.9 mmol/L    Comment: Performed at [facility] [address], [place], [place] [address]  CBC with Differential     Status: Abnormal   Collection Time: 08/28/19  7:16 AM  Result Value Ref Range   WBC 11.7 (H) 4.0 - 10.5 K/uL   RBC  5.07 4.22 - 5.81 MIL/uL   Hemoglobin 17.3 (H) 13.0 - 17.0 g/dL   HCT 49.8 39.0 - 52.0 %   MCV 98.2 80.0 - 100.0 fL   MCH 34.1 (H) 26.0 - 34.0 pg   MCHC 34.7 30.0 - 36.0 g/dL   RDW 13.8 11.5 - 15.5 %   Platelets 151 150 - 400 K/uL   nRBC 0.0 0.0 - 0.2 %   Neutrophils Relative % 84 %   Neutro Abs 9.8 (H) 1.7 - 7.7 K/uL   Lymphocytes Relative 7 %   Lymphs Abs 0.9 0.7 - 4.0 K/uL   Monocytes Relative 7 %   Monocytes Absolute 0.8 0.1 - 1.0 K/uL   Eosinophils Relative 1 %   Eosinophils Absolute 0.2 0.0 - 0.5 K/uL   Basophils Relative 0 %   Basophils Absolute 0.0 0.0 - 0.1 K/uL   Immature Granulocytes 1 %   Abs Immature Granulocytes 0.08 (H) 0.00 - 0.07 K/uL    Comment: Performed at [facility] [address], [place], [place] [address]  Protime-INR     Status: None   Collection Time: 08/28/19  7:16 AM  Result Value Ref Range   Prothrombin Time 14.0 11.4 - 15.2 seconds   INR 1.1 0.8 - 1.2    Comment: (NOTE) INR goal varies based on device and disease states. Performed at [facility], [place] [address] [place], [place] [address]     Dg Chest 2 View  Result Date: 08/28/2019 CLINICAL DATA:  Pt mention no chest pain-only having midline cp pain when he takes deep breaths x this am. Pt mentioned having a knot removed from left forearm x yesterday, HTN-per pt, past smoker Tech wore surgical mask/gloves, pt had on mask EXAM: CHEST - 2 VIEW COMPARISON:  02/16/2018 FINDINGS: Lungs are clear. Heart size and mediastinal contours are within normal limits. No effusion. No pneumothorax. Cervical and lumbar fixation hardware partially visualized. Anterior vertebral endplate spurring at multiple levels in the mid and lower thoracic spine. IMPRESSION: No acute cardiopulmonary disease. Electronically Signed   By: Lucrezia Europe M.D.   On: 08/28/2019 07:48    Review of Systems  Constitutional: Negative for chills, fever and weight loss.  HENT: Negative for ear discharge, ear pain, hearing loss  and tinnitus.   Eyes: Negative for blurred vision, double vision, photophobia and pain.  Respiratory: Negative for cough, sputum production and shortness of breath.   Cardiovascular: Negative for chest pain.  Gastrointestinal: Negative for abdominal pain, nausea and vomiting.  Genitourinary: Negative for dysuria, flank pain, frequency and urgency.  Musculoskeletal: Positive for myalgias (Left arm). Negative for back pain, falls, joint pain and neck pain.  Neurological: Negative for dizziness, tingling, sensory change, focal weakness, loss of consciousness and headaches.  Endo/Heme/Allergies: Does not bruise/bleed easily.  Psychiatric/Behavioral: Negative for depression, memory loss and substance abuse. The patient is not nervous/anxious.    Blood pressure 132/84, pulse 74, temperature (!) 97.5 F (36.4 C), temperature source Oral, resp. rate 16, weight 74.4 kg, SpO2 98 %. Physical Exam  Constitutional: He appears well-developed and well-nourished. No distress.  HENT:  Head: Normocephalic and atraumatic.  Eyes: Conjunctivae are normal. Right eye exhibits no discharge. Left eye exhibits no discharge. No scleral icterus.  Neck: Normal range of motion.  Cardiovascular: Normal rate and regular rhythm.  Respiratory: Effort normal. No respiratory distress.  Musculoskeletal:     Comments: Left shoulder, elbow, wrist, digits- Punctate wound posterior FA ~5cm distal to olecranon, surrounding erythema from mid-FA to distal upper arm, mild diffuse TTP, no fluctuance, scant serosanguinous discharge from wound, bursa boggy but minimal TTP, no pain with ROM, no instability, no blocks to motion  Sens  Ax/R/M/U intact  Mot   Ax/ R/ PIN/ M/ AIN/ U intact  Rad 2+  Neurological: He is alert.  Skin: Skin is warm and dry. He is not diaphoretic.  Psychiatric: He has a normal mood and affect. His behavior is normal.    Assessment/Plan: Left arm cellulitis -- Exam is reassuring for lack of joint involvement.  Will get [place] to look for drainable fluid collection but suspect will just need IV abx. Medicine to admit and manage, appreciate their help. Medical problems including OSA not on CPAP; hypothyroidism; and hemochromatosis -- per primary service    Lisette Abu, PA-C Orthopedic Surgery [phone] 08/28/2019, 9:27 AM

## 2019-08-28 NOTE — H&P (Signed)
History and Physical    Larry [name] [id number] DOB: 10-31-1950 DOA: 08/28/2019  PCP: Merrilee Seashore, MD Consultants:  [facility] - orthopedics; [facility] - neurosurgery Patient coming from:  Home - lives alone; NOK: Daughter, [place], [phone]  Chief Complaint: LUE cellulitis  HPI: Larry [name] is a 68 y.o. male with medical history significant of OSA not on CPAP; hypothyroidism; and hemochromatosis presenting with arm pain.  He reports h/o prior bump on his elbow.  It has been bothering him the last 4 days and he squeezed it with some pus return.  He soaked it in Epsom salt.  The pain and swelling worsened and it started getting red.  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  When it started getting red, he came to the ER.  It was lanced yesterday in the ER with minimal drainage, culture pending.  He was given Vanc x 1 and Clinda at home and discharged.  He went home and went to sleep and it was more painful and swollen.  He decided to go to work but then changed his mind due to rapid progression of prior illness from MRSA.  He had fever yesterday, none today.  No systemic symptoms.  He was previously admitted for sepsis from LE cellulitis in 01/2018 and this was positive for MRSA.  He was treated with Vanc in the hospital with transition to Bactrim.    ED Course:  Worsening of cellulitis of forearm.  Had drainage yesterday, GPC in clusters on culture.  Given Vanc x 1 in ER then Clinda for home.  Today more pain and swelling.  Mild purulence in ER, ?abscess.  Ortho will see, ultrasound ordered.  Treating with Vanc.  Normal lactate.  Review of Systems: As per HPI; otherwise review of systems reviewed and negative.   Ambulatory Status:  Ambulates without assistance or with a cane (upcoming plan for knee replacement)  Past Medical History:  Diagnosis Date  . Arthritis    "knees; left shoulder" (04/12/2013)  . Asthma    "as a child" (04/12/2013)  . DDD  (degenerative disc disease), cervical   . DDD (degenerative disc disease), lumbar   . Difficult intubation    2012   CERVICAL FUSION   . Headache(784.0)    VERAPAMIL FOR PREVENTION   . 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 History of chicken pox    as an adult  . Hypoglycemia    last episode 1 yr. ago, just gets jittery  . Hypothyroidism   . Lumbar stenosis   . OSA (obstructive sleep apnea)    "haven't wore mask in years; had OR; still snore" (04/12/2013)  . Urinary frequency     Past Surgical History:  Procedure Laterality Date  . BACK SURGERY    . CERVICAL FUSION  2002; 2012  . CYSTECTOMY    . INGUINAL HERNIA REPAIR Bilateral 1997  . KNEE ARTHROSCOPY Left ~ 1997; ~ 2005  . LUMBAR FUSION  2007; 04/11/2013  . PENILE DEBRIDEMENT  ~ 2011   X 3  FOR MRSA INFECTION    . SHOULDER ARTHROSCOPY Left ~ 2003  . TONSILLECTOMY  1990's  . TOTAL KNEE ARTHROPLASTY Right 07/23/2015  . TOTAL KNEE ARTHROPLASTY Right 07/23/2015   Procedure: TOTAL KNEE ARTHROPLASTY;  Surgeon: Garald Balding, MD;  Location: [facility];  Service: Orthopedics;  Laterality: Right;  . UVULOPALATOPHARYNGOPLASTY (  UPPP)/TONSILLECTOMY/SEPTOPLASTY  1990's  . VASECTOMY      Social History   Socioeconomic History  . Marital status: Legally Separated    Spouse name: Not on file  . Number of children: Not on file  . Years of education: Not on file  . Highest education level: Not on file  Occupational History  . Not on file  Social Needs  . Financial resource strain: Not on file  . Food insecurity    Worry: Not on file    Inability: Not on file  . Transportation needs    Medical: Not on file    Non-medical: Not on file  Tobacco Use  . Smoking status: Former Smoker    Packs/day: 0.50    Years: 15.00    Pack years: 7.50    Types: Cigarettes    Quit date: 10/14/1986    Years since quitting: 32.8  . Smokeless tobacco:  Never Used  Substance and Sexual Activity  . Alcohol use: Yes    Comment: occasional  . Drug use: No  . Sexual activity: Yes  Lifestyle  . Physical activity    Days per week: Not on file    Minutes per session: Not on file  . Stress: Not on file  Relationships  . Social Herbalist on phone: Not on file    Gets together: Not on file    Attends religious service: Not on file    Active member of club or organization: Not on file    Attends meetings of clubs or organizations: Not on file    Relationship status: Not on file  . Intimate partner violence    Fear of current or ex partner: Not on file    Emotionally abused: Not on file    Physically abused: Not on file    Forced sexual activity: Not on file  Other Topics Concern  . Not on file  Social History Narrative  . Not on file    No Known Allergies  No family history on file.  Prior to Admission medications   Medication Sig Start Date End Date Taking? Authorizing Provider  aspirin EC 81 MG tablet Take 81 mg by mouth daily.    [provider]  clindamycin (CLEOCIN) 300 MG capsule Take 1 capsule (300 mg total) by mouth 4 (four) times daily. X 7 days 08/27/19   Daleen Bo, MD  furosemide (LASIX) 40 MG tablet Take 40 mg by mouth daily as needed for edema.     [provider]  HYDROcodone-acetaminophen (NORCO) 5-325 MG tablet Take 1 tablet by mouth every 4 (four) hours as needed for moderate pain. 08/27/19   Daleen Bo, MD  HYDROmorphone (DILAUDID) 2 MG tablet Take 1 tablet (2 mg total) by mouth every 4 (four) hours as needed for severe pain. Patient not taking: Reported on 12/09/2018 01/25/18   Kristeen Miss, MD  latanoprost (XALATAN) 0.005 % ophthalmic solution Place 1 drop into both eyes at bedtime. 03/13/17   [provider]  levothyroxine (SYNTHROID) 200 MCG tablet Take 200 mcg by mouth daily before breakfast.     [provider]  LORazepam (ATIVAN) 0.5 MG tablet Take 1 mg  by mouth at bedtime.  03/14/17   [provider]  losartan (COZAAR) 50 MG tablet Take 50 mg by mouth daily.  07/17/19   [provider]  meloxicam (MOBIC) 15 MG tablet Take 15 mg by mouth daily.  09/27/18   [provider]  Multiple Vitamins-Minerals (  MULTIVITAMIN PO) Take 1 tablet by mouth daily.    [provider]  Oxymetazoline HCl (AFRIN NASAL SPRAY NA) Place 2 sprays into both nostrils 2 (two) times daily as needed (nasal congestion).    [provider]  Phosphatidylserine-DHA-EPA (VAYACOG) 100-19.5-6.5 MG CAPS Take 1 capsule by mouth daily.    [provider]  Sildenafil Citrate (VIAGRA PO) Take 30 mg by mouth daily as needed (for erectile dysfunction). Purchased from [employer]--dose indicated on website as 30 mg    [provider]  tamsulosin (FLOMAX) 0.4 MG CAPS capsule Take 0.4 mg by mouth daily.    [provider]  testosterone cypionate (DEPOTESTOSTERONE CYPIONATE) 200 MG/ML injection Inject 100 mg into the muscle every 14 (fourteen) days. 02/07/18   [provider]  verapamil (CALAN-SR) 180 MG CR tablet Take 180 mg by mouth daily. 12/31/16   [provider]    Physical Exam: Vitals:   08/28/19 0711 08/28/19 0712 08/28/19 0900  BP: 139/68  132/84  Pulse: 78  74  Resp: 18  16  Temp: (!) 97.5 F (36.4 C)    TempSrc: Oral    SpO2: 98%  98%  Weight:  74.4 kg      . General:  Appears calm and comfortable and is NAD . Eyes:  PERRL, EOMI, normal lids, iris . ENT:  grossly normal hearing, lips & tongue, mmm; appropriate dentition . Neck:  no LAD, masses or thyromegaly . Cardiovascular:  RRR, no m/r/g. No LE edema.  Marland Kitchen Respiratory:   CTA bilaterally with no wheezes/rales/rhonchi.  Normal respiratory effort. . Abdomen:  soft, NT, ND, NABS . Skin:  Pustule on L lateral elbow with diffuse surrounding erythema and edema of the forearm as well as upper arm extending close the axilla.           . Musculoskeletal:  grossly normal tone BUE/BLE, good ROM, no bony abnormality . Psychiatric:  grossly normal mood and affect, speech fluent and appropriate, AOx3 . Neurologic:  CN 2-12 grossly intact, moves all extremities in coordinated fashion, sensation intact    Radiological Exams on Admission: Dg Chest 2 View  Result Date: 08/28/2019 CLINICAL DATA:  Pt mention no chest pain-only having midline cp pain when he takes deep breaths x this am. Pt mentioned having a knot removed from left forearm x yesterday, HTN-per pt, past smoker Tech wore surgical mask/gloves, pt had on mask EXAM: CHEST - 2 VIEW COMPARISON:  02/16/2018 FINDINGS: Lungs are clear. Heart size and mediastinal contours are within normal limits. No effusion. No pneumothorax. Cervical and lumbar fixation hardware partially visualized. Anterior vertebral endplate spurring at multiple levels in the mid and lower thoracic spine. IMPRESSION: No acute cardiopulmonary disease. Electronically Signed   By: Lucrezia Europe M.D.   On: 08/28/2019 07:48   [place] Lt Upper Extrem Ltd Soft Tissue Non Vascular  Result Date: 08/28/2019 CLINICAL DATA:  Left elbow cellulitis. EXAM: ULTRASOUND LEFT UPPER EXTREMITY LIMITED TECHNIQUE: Ultrasound examination of the upper extremity soft tissues was performed in the area of clinical concern. COMPARISON:  None. FINDINGS: Examination was targeted to the soft tissues of the left elbow. There is generalized subcutaneous edema without focal fluid collection. Intra-articular evaluation was not performed. No large joint effusion identified. IMPRESSION: Subcutaneous edema about the left elbow consistent with cellulitis. No focal fluid collection identified to suggest abscess. Electronically Signed   By: Richardean Sale M.D.   On: 08/28/2019 10:21    EKG: Independently reviewed.  Sinus tachycardia with rate 107; LVH;  nonspecific ST changes with no evidence of acute ischemia; NSCSLT   Labs on Admission: I have personally  reviewed the available labs and imaging studies at the time of the admission.  Pertinent labs:   Glucose 118 Bili 1.8 Lactate 1.4 WBC 11.7 INR 1.1 Blood cultures pending Wound culture: GPC in clusters, ID &Sensitivities pending   Assessment/Plan Principal Problem:   Cellulitis of arm Active Problems:   Hypothyroidism   Essential hypertension   Chronic pain   LUE cellulitis -Patient was seen in the ER yesterday for the same; I&D with minimal purulent drainage which was sent for culture -He received one dose of Vanc yesterday and was discharged on Clinda but took only one or two doses before symptoms worsened, prompting him to return; as such, this does not appear to constitute outpatient treatment failure -His only SIRS criteria is leukocytosis and he does not have evidence of organ failure; this does not appear to be sepsis at this time -Given his h/o MRSA, will treat with Vanc monotherapy for now; culture appears to be growing Strep -[place] appears to indicate lack of abscess or drainable fluid collection -Orthopedics is consulting but surgical intervention does not appear to be indicated at this time -If improved tomorrow, he likely can be discharged  Chronic pain -I attempted to review this patient in the [place] Controlled Substances Reporting System but was unable to access the system. -Will continue Norco prn  HTN -Continue Cozaar, Verapamil  Hypothyroidism -Check TSH -Continue Synthroid at current dose for now  Osteoarthritis -Scheduled for operative repair in January -Continue Mobic and Norco    Note: This patient has been tested and is pending for the novel coronavirus COVID-19.  DVT prophylaxis:  Lovenox  Code Status:  Full - confirmed with patient Family Communication: None present  Disposition Plan:  Home once clinically improved Consults called: Orthopedics  Admission status: It is my clinical opinion that referral for OBSERVATION is reasonable and necessary  in this patient based on the above information provided. The aforementioned taken together are felt to place the patient at high risk for further clinical deterioration. However it is anticipated that the patient may be medically stable for discharge from the hospital within 24 to 48 hours.    Karmen Bongo MD [facility]   How to contact the [facility] Attending or Consulting provider [place] or covering provider during after hours [place], for this patient?  1. Check the care team in [facility] and look for a) attending/consulting [facility] provider listed and b) the [facility] team listed 2. Log into [email or web address] and use [place]'s universal password to access. If you do not have the password, please contact the hospital operator. 3. Locate the [facility] provider you are looking for under [facility] and page to a number that you can be directly reached. 4. If you still have difficulty reaching the provider, please page the [facility] (Director on Call) for the Hospitalists listed on amion for assistance.   08/28/2019, 10:45 AM

## 2019-08-28 NOTE — ED Provider Notes (Signed)
[facility] EMERGENCY DEPARTMENT Provider Note   CSN: [id number] Arrival date & time: 08/28/19  [id number]     History   Chief Complaint Chief Complaint  Patient presents with  . L arm infection    HPI Larry [name] is a 68 y.o. male.     HPI Patient presents as a return after forearm cellulitis/abscess.  Seen in the ER yesterday and had drainage of what was told to be a 2 cm abscess.  Reportedly only had a small amount of purulence however.  Culture sent.  Had dose of vancomycin yesterday and has had clindamycin at home.  However woke up with more swelling of the forearm today.  No fevers or chills.  More redness.  History of MRSA infection requiring drainage and has had sepsis from it in the past. Past Medical History:  Diagnosis Date  . Arthritis    "knees; left shoulder" (04/12/2013)  . Asthma    "as a child" (04/12/2013)  . DDD (degenerative disc disease), cervical   . DDD (degenerative disc disease), lumbar   . Difficult intubation    2012   CERVICAL FUSION   . Headache(784.0)    VERAPAMIL FOR PREVENTION   . 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 History of chicken pox    as an adult  . Hypoglycemia    last episode 1 yr. ago, just gets jittery  . Hypothyroidism   . Lumbar stenosis   . OSA (obstructive sleep apnea)    "haven't wore mask in years; had OR; still snore" (04/12/2013)  . Urinary frequency     Patient Active Problem List   Diagnosis Date Noted  . Essential hypertension 02/16/2018  . Sepsis due to skin infection ([place]) 02/16/2018  . Chronic pain 02/16/2018  . Cellulitis of left lower extremity   . Lumbar radiculopathy, chronic 01/24/2018  . Lumbar stenosis with neurogenic claudication 03/23/2017  . Hypothyroidism 07/25/2015  . Primary osteoarthritis of right knee 07/23/2015  . Primary osteoarthritis of knee 07/23/2015    Past Surgical  History:  Procedure Laterality Date  . BACK SURGERY    . CERVICAL FUSION  2002; 2012  . CYSTECTOMY    . INGUINAL HERNIA REPAIR Bilateral 1997  . KNEE ARTHROSCOPY Left ~ 1997; ~ 2005  . LUMBAR FUSION  2007; 04/11/2013  . PENILE DEBRIDEMENT  ~ 2011   X 3  FOR MRSA INFECTION    . SHOULDER ARTHROSCOPY Left ~ 2003  . TONSILLECTOMY  1990's  . TOTAL KNEE ARTHROPLASTY Right 07/23/2015  . TOTAL KNEE ARTHROPLASTY Right 07/23/2015   Procedure: TOTAL KNEE ARTHROPLASTY;  Surgeon: Garald Balding, MD;  Location: [facility];  Service: Orthopedics;  Laterality: Right;  . UVULOPALATOPHARYNGOPLASTY (UPPP)/TONSILLECTOMY/SEPTOPLASTY  1990's  . VASECTOMY          Home Medications    Prior to Admission medications   Medication Sig Start Date End Date Taking? Authorizing Provider  aspirin EC 81 MG tablet Take 81 mg by mouth daily.    [provider]  clindamycin (CLEOCIN) 300 MG capsule Take 1 capsule (300 mg total) by mouth 4 (four) times daily. X 7 days 08/27/19   Daleen Bo, MD  furosemide (LASIX) 40 MG tablet Take 40 mg by mouth daily as needed for edema.     [provider]  HYDROcodone-acetaminophen (NORCO) 5-325 MG tablet Take 1 tablet by mouth every  4 (four) hours as needed for moderate pain. 08/27/19   Daleen Bo, MD  HYDROmorphone (DILAUDID) 2 MG tablet Take 1 tablet (2 mg total) by mouth every 4 (four) hours as needed for severe pain. Patient not taking: Reported on 12/09/2018 01/25/18   Kristeen Miss, MD  latanoprost (XALATAN) 0.005 % ophthalmic solution Place 1 drop into both eyes at bedtime. 03/13/17   [provider]  levothyroxine (SYNTHROID) 200 MCG tablet Take 200 mcg by mouth daily before breakfast.     [provider]  LORazepam (ATIVAN) 0.5 MG tablet Take 1 mg by mouth at bedtime.  03/14/17   [provider]  losartan (COZAAR) 50 MG tablet Take 50 mg by mouth daily.  07/17/19   [provider]  meloxicam (MOBIC) 15 MG tablet Take  15 mg by mouth daily.  09/27/18   [provider]  Multiple Vitamins-Minerals (MULTIVITAMIN PO) Take 1 tablet by mouth daily.    [provider]  Oxymetazoline HCl (AFRIN NASAL SPRAY NA) Place 2 sprays into both nostrils 2 (two) times daily as needed (nasal congestion).    [provider]  Phosphatidylserine-DHA-EPA (VAYACOG) 100-19.5-6.5 MG CAPS Take 1 capsule by mouth daily.    [provider]  Sildenafil Citrate (VIAGRA PO) Take 30 mg by mouth daily as needed (for erectile dysfunction). Purchased from [employer]--dose indicated on website as 30 mg    [provider]  tamsulosin (FLOMAX) 0.4 MG CAPS capsule Take 0.4 mg by mouth daily.    [provider]  testosterone cypionate (DEPOTESTOSTERONE CYPIONATE) 200 MG/ML injection Inject 100 mg into the muscle every 14 (fourteen) days. 02/07/18   [provider]  verapamil (CALAN-SR) 180 MG CR tablet Take 180 mg by mouth daily. 12/31/16   [provider]    Family History No family history on file.  Social History Social History   Tobacco Use  . Smoking status: Former Smoker    Packs/day: 0.50    Years: 15.00    Pack years: 7.50    Types: Cigarettes    Quit date: 10/14/1986    Years since quitting: 32.8  . Smokeless tobacco: Never Used  Substance Use Topics  . Alcohol use: No  . Drug use: No     Allergies   Patient has no known allergies.   Review of Systems Review of Systems  Constitutional: Negative for appetite change.  HENT: Negative for congestion.   Cardiovascular: Negative for chest pain.  Gastrointestinal: Negative for abdominal pain.  Genitourinary: Negative for flank pain.  Musculoskeletal: Negative for back pain.  Skin: Positive for color change and wound.  Neurological: Negative for weakness.  Psychiatric/Behavioral: Negative for confusion.     Physical Exam Updated Vital Signs BP 139/68   Pulse 78   Temp (!) 97.5 F (36.4 C) (Oral)    Resp 18   Wt 74.4 kg   SpO2 98%   BMI 26.47 kg/m   Physical Exam Vitals signs and nursing note reviewed.  HENT:     Head: Atraumatic.  Eyes:     Pupils: Pupils are equal, round, and reactive to light.  Neck:     Musculoskeletal: Muscular tenderness present.  Cardiovascular:     Rate and Rhythm: Regular rhythm.  Pulmonary:     Breath sounds: No wheezing, rhonchi or rales.  Musculoskeletal:     Comments: Erythema and swelling of forearm pain tracking up the posterior upper arm.  Site of previous incision and drainage is distal to the elbow and  has mild purulence expressed with palpation.  No fluctuance.  There is some induration surrounding this and an area smaller than the erythema.  Good movement in the elbow.  Skin:    General: Skin is warm.     Capillary Refill: Capillary refill takes less than 2 seconds.  Neurological:     Mental Status: He is alert and oriented to person, place, and time.          ED Treatments / Results  Labs (all labs ordered are listed, but only abnormal results are displayed) Labs Reviewed  COMPREHENSIVE METABOLIC PANEL - Abnormal; Notable for the following components:      Result Value   Glucose, Bld 118 (*)    Total Bilirubin 1.8 (*)    All other components within normal limits  CBC WITH DIFFERENTIAL/PLATELET - Abnormal; Notable for the following components:   WBC 11.7 (*)    Hemoglobin 17.3 (*)    MCH 34.1 (*)    Neutro Abs 9.8 (*)    Abs Immature Granulocytes 0.08 (*)    All other components within normal limits  CULTURE, BLOOD (ROUTINE X 2)  CULTURE, BLOOD (ROUTINE X 2)  LACTIC ACID, PLASMA  PROTIME-INR  LACTIC ACID, PLASMA  URINALYSIS, ROUTINE W REFLEX MICROSCOPIC    EKG None  Radiology Dg Chest 2 View  Result Date: 08/28/2019 CLINICAL DATA:  Pt mention no chest pain-only having midline cp pain when he takes deep breaths x this am. Pt mentioned having a knot removed from left forearm x yesterday, HTN-per pt, past smoker  Tech wore surgical mask/gloves, pt had on mask EXAM: CHEST - 2 VIEW COMPARISON:  02/16/2018 FINDINGS: Lungs are clear. Heart size and mediastinal contours are within normal limits. No effusion. No pneumothorax. Cervical and lumbar fixation hardware partially visualized. Anterior vertebral endplate spurring at multiple levels in the mid and lower thoracic spine. IMPRESSION: No acute cardiopulmonary disease. Electronically Signed   By: Lucrezia Europe M.D.   On: 08/28/2019 07:48    Procedures Procedures (including critical care time)  Medications Ordered in ED Medications - No data to display   Initial Impression / Assessment and Plan / ED Course  I have reviewed the triage vital signs and the nursing notes.  Pertinent labs & imaging results that were available during my care of the patient were reviewed by me and considered in my medical decision making (see chart for details).        Patient with cellulitis versus abscess of the left forearm.  More redness and swelling.  Culture yesterday shows gram-positive cocci in clusters.  White count mildly elevated with normal lactic acid.  Afebrile.  Worsening redness however.  Hand surgery will be following.  Will admit to internal medicine.  Final Clinical Impressions(s) / ED Diagnoses   Final diagnoses:  Cellulitis of forearm, left    ED Discharge Orders    None       Davonna Belling, MD 08/28/19 [phone]

## 2019-08-28 NOTE — ED Notes (Signed)
ED Provider at bedside. 

## 2019-08-28 NOTE — ED Notes (Signed)
Pt being transported to US

## 2019-08-28 NOTE — ED Triage Notes (Signed)
Pt in with L arm redness/swelling. Came to ED yesterday for dime-sized bump that was cut and drained. Area was cultured, pt woke up this am to increased swelling, redness and warmth of forearm, streaking up arm. Area was cultured yesterday. Hx of MRSA and sepsis in past. Temp 97.5 in triage

## 2019-08-29 DIAGNOSIS — L03114 Cellulitis of left upper limb: Secondary | ICD-10-CM | POA: Diagnosis not present

## 2019-08-29 DIAGNOSIS — I10 Essential (primary) hypertension: Secondary | ICD-10-CM | POA: Diagnosis not present

## 2019-08-29 DIAGNOSIS — G8929 Other chronic pain: Secondary | ICD-10-CM | POA: Diagnosis present

## 2019-08-29 DIAGNOSIS — Z981 Arthrodesis status: Secondary | ICD-10-CM | POA: Diagnosis not present

## 2019-08-29 DIAGNOSIS — N400 Enlarged prostate without lower urinary tract symptoms: Secondary | ICD-10-CM | POA: Diagnosis present

## 2019-08-29 DIAGNOSIS — Z20828 Contact with and (suspected) exposure to other viral communicable diseases: Secondary | ICD-10-CM | POA: Diagnosis present

## 2019-08-29 DIAGNOSIS — E039 Hypothyroidism, unspecified: Secondary | ICD-10-CM | POA: Diagnosis not present

## 2019-08-29 DIAGNOSIS — Z87891 Personal history of nicotine dependence: Secondary | ICD-10-CM | POA: Diagnosis not present

## 2019-08-29 DIAGNOSIS — G4733 Obstructive sleep apnea (adult) (pediatric): Secondary | ICD-10-CM | POA: Diagnosis present

## 2019-08-29 DIAGNOSIS — B9562 Methicillin resistant Staphylococcus aureus infection as the cause of diseases classified elsewhere: Secondary | ICD-10-CM | POA: Diagnosis present

## 2019-08-29 DIAGNOSIS — Z96651 Presence of right artificial knee joint: Secondary | ICD-10-CM | POA: Diagnosis present

## 2019-08-29 DIAGNOSIS — L03119 Cellulitis of unspecified part of limb: Secondary | ICD-10-CM | POA: Diagnosis not present

## 2019-08-29 DIAGNOSIS — G894 Chronic pain syndrome: Secondary | ICD-10-CM | POA: Diagnosis not present

## 2019-08-29 LAB — CBC
HCT: 47.8 % (ref 39.0–52.0)
Hemoglobin: 16.4 g/dL (ref 13.0–17.0)
MCH: 33.6 pg (ref 26.0–34.0)
MCHC: 34.3 g/dL (ref 30.0–36.0)
MCV: 98 fL (ref 80.0–100.0)
Platelets: 151 10*3/uL (ref 150–400)
RBC: 4.88 MIL/uL (ref 4.22–5.81)
RDW: 13.5 % (ref 11.5–15.5)
WBC: 10.7 10*3/uL — ABNORMAL HIGH (ref 4.0–10.5)
nRBC: 0 % (ref 0.0–0.2)

## 2019-08-29 LAB — BASIC METABOLIC PANEL
Anion gap: 16 — ABNORMAL HIGH (ref 5–15)
BUN: 10 mg/dL (ref 8–23)
CO2: 21 mmol/L — ABNORMAL LOW (ref 22–32)
Calcium: 8.7 mg/dL — ABNORMAL LOW (ref 8.9–10.3)
Chloride: 102 mmol/L (ref 98–111)
Creatinine, Ser: 0.85 mg/dL (ref 0.61–1.24)
GFR calc Af Amer: >60 mL/min (ref >60)
GFR calc non Af Amer: >60 mL/min (ref >60)
Glucose, Bld: 99 mg/dL (ref 70–99)
Potassium: 4.4 mmol/L (ref 3.5–5.1)
Sodium: 139 mmol/L (ref 135–145)

## 2019-08-29 LAB — AEROBIC CULTURE W GRAM STAIN (SUPERFICIAL SPECIMEN): Special Requests: NORMAL

## 2019-08-29 LAB — AEROBIC CULTURE  (SUPERFICIAL SPECIMEN)

## 2019-08-29 NOTE — Progress Notes (Signed)
Patient awake and seated up in bed, notes no new pain or increase in pain today. No increase in erythema from previous outline drawn yesterday. No new fluctuance. Mild TTP to surrounding area around wound. No pain with motion of elbow, full ROM of elbow. Distal sensation intact. US showed no focal fluid collection that could possibly be drained. Blood culture shows no growth so far.  At this time, it seems unnecessary to perform further imaging or surgical intervention, but will continue to follow.

## 2019-08-29 NOTE — Progress Notes (Signed)
PROGRESS NOTE  Larry [name] [id number] DOB: 1951/03/10 DOA: 08/28/2019 PCP: Merrilee Seashore, MD  HPI/Recap of past 24 hours: HPI from Dr Briscoe Burns Rampersaud is a 68 y.o. male with medical history significant of OSA not on CPAP; hypothyroidism; and hemochromatosis presenting with arm pain.  He reports h/o prior bump on his elbow which has been bothering him intermittently for the past week.  Noted left upper extremity was progressively getting red and swollen.  Presented to the ER had some minimal drainage, sent for culture and was discharged home on p.o. antibiotics.  Patient noted worsening symptoms and decided to come back again to the ER.  Patient has had a history of MRSA infection in the past which was pretty severe.  The ED, patient remained afebrile, vital signs somewhat stable.  Orthopedics consulted.  Ultrasound ordered which showed no drainable abscess. Patient admitted for further management.   Today, patient reports right upper extremity is about the same, no significant improvement.  Patient denies any fever/chills, shortness of breath, chest pain, abdominal pain, nausea/vomiting, diarrhea.  Assessment/Plan: Principal Problem:   Cellulitis of arm Active Problems:   Hypothyroidism   Essential hypertension   Chronic pain   LUE cellulitis Currently afebrile, with mild leukocytosis BC x2 pending Culture of drainage done on 08/27/2019 growing few staph aureus, susceptibilities to follow USS appears to indicate lack of abscess or drainable fluid collection Orthopedics on board, will follow Continue IV vancomycin for another day, may be discharged tomorrow if improvement  Chronic pain Continue Norco prn  HTN Continue Cozaar, Verapamil  Hypothyroidism TSH 1.567 Continue Synthroid  BPH Continue Flomax         Malnutrition Type:      Malnutrition Characteristics:      Nutrition Interventions:       Estimated body mass index is  26.47 kg/m as calculated from the following:   Height as of 08/09/19: 5\' 6"  (1.676 m).   Weight as of this encounter: 74.4 kg.     Code Status: Full  Family Communication: None at bedside  Disposition Plan: Likely home   Consultants:  Orthopedics  Procedures:  None  Antimicrobials:  Vancomycin  DVT prophylaxis: Lovenox   Objective: Vitals:   08/28/19 1731 08/28/19 2345 08/29/19 0547 08/29/19 0910  BP: 136/77 137/69 134/76 130/76  Pulse: 71 66 64 66  Resp: 18 18 18 18   Temp: 99.6 F (37.6 C) 98.5 F (36.9 C) 98 F (36.7 C) 98.5 F (36.9 C)  TempSrc: Oral Oral Oral Oral  SpO2: 96% 96% 95% 94%  Weight:        Intake/Output Summary (Last 24 hours) at 08/29/2019 1134 Last data filed at 08/29/2019 0300 Gross per 24 hour  Intake 390 ml  Output -  Net 390 ml   Filed Weights   08/28/19 0712  Weight: 74.4 kg    Exam:  General: NAD   Cardiovascular: S1, S2 present  Respiratory: CTAB  Abdomen: Soft, nontender, nondistended, bowel sounds present  Musculoskeletal: No bilateral pedal edema noted  Skin:  Pustule on left lateral elbow with surrounding erythema, swelling of forearm/arm  Psychiatry: Normal mood   Data Reviewed: CBC: Recent Labs  Lab 08/27/19 1308 08/28/19 0716 08/29/19 0620  WBC 8.7 11.7* 10.7*  NEUTROABS 7.0 9.8*  --   HGB 16.9 17.3* 16.4  HCT 47.4 49.8 47.8  MCV 97.3 98.2 98.0  PLT 154 151 [id number]   Basic Metabolic Panel: Recent Labs  Lab 08/27/19 1308 08/28/19 0716 08/29/19 [id number]  NA 139 138 139  K 4.3 4.5 4.4  CL 104 102 102  CO2 25 26 21*  GLUCOSE 105* 118* 99  BUN 14 12 10   CREATININE 0.89 0.96 0.85  CALCIUM 9.0 9.0 8.7*   GFR: Estimated Creatinine Clearance: 76.1 mL/min (by C-G formula based on SCr of 0.85 mg/dL). Liver Function Tests: Recent Labs  Lab 08/27/19 1308 08/28/19 0716  AST 28 27  ALT 29 25  ALKPHOS 45 49  BILITOT 1.5* 1.8*  PROT 6.4* 6.5  ALBUMIN 3.9 3.7   No results for input(s): LIPASE,  AMYLASE in the last 168 hours. No results for input(s): AMMONIA in the last 168 hours. Coagulation Profile: Recent Labs  Lab 08/28/19 0716  INR 1.1   Cardiac Enzymes: No results for input(s): CKTOTAL, CKMB, CKMBINDEX, TROPONINI in the last 168 hours. BNP (last 3 results) No results for input(s): PROBNP in the last 8760 hours. HbA1C: No results for input(s): HGBA1C in the last 72 hours. CBG: No results for input(s): GLUCAP in the last 168 hours. Lipid Profile: No results for input(s): CHOL, HDL, LDLCALC, TRIG, CHOLHDL, LDLDIRECT in the last 72 hours. Thyroid Function Tests: Recent Labs    08/28/19 1342  TSH 1.567   Anemia Panel: No results for input(s): VITAMINB12, FOLATE, FERRITIN, TIBC, IRON, RETICCTPCT in the last 72 hours. Urine analysis:    Component Value Date/Time   COLORURINE YELLOW 02/16/2018 2329   APPEARANCEUR HAZY (A) 02/16/2018 2329   LABSPEC >=1.030 07/29/2018 2007   PHURINE 5.5 07/29/2018 2007   GLUCOSEU NEGATIVE 07/29/2018 2007   HGBUR LARGE (A) 07/29/2018 2007   BILIRUBINUR NEGATIVE 07/29/2018 2007   KETONESUR 15 (A) 07/29/2018 2007   PROTEINUR 30 (A) 07/29/2018 2007   UROBILINOGEN 0.2 07/29/2018 2007   NITRITE NEGATIVE 07/29/2018 2007   LEUKOCYTESUR NEGATIVE 07/29/2018 2007   Sepsis Labs: @LABRCNTIP (procalcitonin:4,lacticidven:4)  ) Recent Results (from the past 240 hour(s))  Wound or Superficial Culture     Status: None (Preliminary result)   Collection Time: 08/27/19  3:41 PM   Specimen: Abscess; Wound  Result Value Ref Range Status   Specimen Description ABSCESS  Final   Special Requests Normal  Final   Gram Stain   Final    FEW WBC PRESENT, PREDOMINANTLY PMN FEW GRAM POSITIVE COCCI IN PAIRS IN CLUSTERS    Culture   Final    FEW STAPHYLOCOCCUS AUREUS SUSCEPTIBILITIES TO FOLLOW Performed at [facility], [address] [place], [place] [address]    Report Status PENDING  Incomplete  Culture, blood (Routine x 2)     Status: None  (Preliminary result)   Collection Time: 08/28/19  7:16 AM   Specimen: BLOOD  Result Value Ref Range Status   Specimen Description BLOOD RIGHT ANTECUBITAL  Final   Special Requests   Final    BOTTLES DRAWN AEROBIC AND ANAEROBIC Blood Culture adequate volume   Culture   Final    NO GROWTH < 24 HOURS Performed at [facility], [place] [address] [place], [place] [address]    Report Status PENDING  Incomplete  Culture, blood (Routine x 2)     Status: None (Preliminary result)   Collection Time: 08/28/19  7:16 AM   Specimen: BLOOD  Result Value Ref Range Status   Specimen Description BLOOD LEFT ANTECUBITAL  Final   Special Requests   Final    BOTTLES DRAWN AEROBIC AND ANAEROBIC Blood Culture adequate volume   Culture   Final    NO GROWTH <  24 HOURS Performed at [facility], [place] [address], [place], [place] [address]    Report Status PENDING  Incomplete  SARS CORONAVIRUS 2 (TAT 6-24 HRS) Nasopharyngeal Nasopharyngeal Swab     Status: None   Collection Time: 08/28/19  9:25 AM   Specimen: Nasopharyngeal Swab  Result Value Ref Range Status   SARS Coronavirus 2 NEGATIVE NEGATIVE Final    Comment: (NOTE) SARS-CoV-2 target nucleic acids are NOT DETECTED. The SARS-CoV-2 RNA is generally detectable in upper and lower respiratory specimens during the acute phase of infection. Negative results do not preclude SARS-CoV-2 infection, do not rule out co-infections with other pathogens, and should not be used as the sole basis for treatment or other patient management decisions. Negative results must be combined with clinical observations, patient history, and epidemiological information. The expected result is Negative. Fact Sheet for Patients: SugarRoll.be Fact Sheet for Healthcare Providers: https://www.woods-mathews.com/ This test is not yet approved or cleared by the [place] FDA and  has been authorized for detection and/or diagnosis  of SARS-CoV-2 by FDA under an Emergency Use Authorization (EUA). This EUA will remain  in effect (meaning this test can be used) for the duration of the COVID-19 declaration under Section 56 4(b)(1) of the Act, 21 U.S.C. section 360bbb-3(b)(1), unless the authorization is terminated or revoked sooner. Performed at [facility], [place] [address] [place], [place] [address]       Studies: No results found.  Scheduled Meds: . aspirin EC  81 mg Oral Daily  . docusate sodium  100 mg Oral BID  . enoxaparin (LOVENOX) injection  40 mg Subcutaneous Q24H  . latanoprost  1 drop Both Eyes QHS  . levothyroxine  200 mcg Oral QAC breakfast  . LORazepam  1 mg Oral QHS  . losartan  50 mg Oral Daily  . meloxicam  15 mg Oral Daily  . tamsulosin  0.4 mg Oral Daily  . verapamil  180 mg Oral Daily    Continuous Infusions: . vancomycin 750 mg (08/29/19 1015)     LOS: 0 days     Alma Friendly, MD [facility]  If 7PM-7AM, please contact night-coverage [email or web address] 08/29/2019, 11:34 AM

## 2019-08-30 ENCOUNTER — Inpatient Hospital Stay (HOSPITAL_COMMUNITY): Payer: Medicare Other

## 2019-08-30 ENCOUNTER — Telehealth: Payer: Self-pay | Admitting: Emergency Medicine

## 2019-08-30 LAB — CBC WITH DIFFERENTIAL/PLATELET
Abs Immature Granulocytes: 0.07 10*3/uL (ref 0.00–0.07)
Basophils Absolute: 0.1 10*3/uL (ref 0.0–0.1)
Basophils Relative: 1 %
Eosinophils Absolute: 0.3 10*3/uL (ref 0.0–0.5)
Eosinophils Relative: 3 %
HCT: 44.6 % (ref 39.0–52.0)
Hemoglobin: 15.7 g/dL (ref 13.0–17.0)
Immature Granulocytes: 1 %
Lymphocytes Relative: 15 %
Lymphs Abs: 1.2 10*3/uL (ref 0.7–4.0)
MCH: 33.8 pg (ref 26.0–34.0)
MCHC: 35.2 g/dL (ref 30.0–36.0)
MCV: 95.9 fL (ref 80.0–100.0)
Monocytes Absolute: 0.7 10*3/uL (ref 0.1–1.0)
Monocytes Relative: 9 %
Neutro Abs: 5.8 10*3/uL (ref 1.7–7.7)
Neutrophils Relative %: 71 %
Platelets: 133 10*3/uL — ABNORMAL LOW (ref 150–400)
RBC: 4.65 MIL/uL (ref 4.22–5.81)
RDW: 13.2 % (ref 11.5–15.5)
WBC: 8.1 10*3/uL (ref 4.0–10.5)
nRBC: 0 % (ref 0.0–0.2)

## 2019-08-30 LAB — BASIC METABOLIC PANEL
Anion gap: 11 (ref 5–15)
BUN: 9 mg/dL (ref 8–23)
CO2: 27 mmol/L (ref 22–32)
Calcium: 8.5 mg/dL — ABNORMAL LOW (ref 8.9–10.3)
Chloride: 101 mmol/L (ref 98–111)
Creatinine, Ser: 0.87 mg/dL (ref 0.61–1.24)
GFR calc Af Amer: >60 mL/min (ref >60)
GFR calc non Af Amer: >60 mL/min (ref >60)
Glucose, Bld: 102 mg/dL — ABNORMAL HIGH (ref 70–99)
Potassium: 3.9 mmol/L (ref 3.5–5.1)
Sodium: 139 mmol/L (ref 135–145)

## 2019-08-30 LAB — MRSA PCR SCREENING: MRSA by PCR: NEGATIVE

## 2019-08-30 MED ORDER — LORAZEPAM 2 MG/ML IJ SOLN
0.5000 mg | INTRAMUSCULAR | Status: DC | PRN
  Administered 2019-08-30: 0.5 mg via INTRAVENOUS
  Filled 2019-08-30: qty 1

## 2019-08-30 MED ORDER — GADOBUTROL 1 MMOL/ML IV SOLN: 7.0000 mL | Freq: Once | INTRAVENOUS | Status: DC | PRN

## 2019-08-30 NOTE — Progress Notes (Signed)
Orthopedic Tech Progress Note Patient Details:  Larry [name] 1951-08-06 [id number]  Ortho Devices Type of Ortho Device: Sling arm elevator Ortho Device/Splint Location: LUE Ortho Device/Splint Interventions: Ordered, Application   Post Interventions Patient Tolerated: Well Instructions Provided: Care of device, Adjustment of device   Janit Pagan 08/30/2019, 3:43 PM

## 2019-08-30 NOTE — Telephone Encounter (Signed)
Post ED Visit - Positive Culture Follow-up  Culture report reviewed by antimicrobial stewardship pharmacist: [facility] Team []  Elenor Quinones, Pharm.D. []  Heide Guile, Pharm.D., BCPS AQ-ID []  Parks Neptune, Pharm.D., BCPS []  Alycia Rossetti, Pharm.D., BCPS []  [place], Pharm.D., BCPS, AAHIVP []  Legrand Como, Pharm.D., BCPS, AAHIVP []  Salome Arnt, PharmD, BCPS []  Johnnette Gourd, PharmD, BCPS [x]  Hughes Better, PharmD, BCPS []  Leeroy Cha, PharmD []  Laqueta Linden, PharmD, BCPS []  Albertina Parr, PharmD  [place] Team []  Leodis Sias, PharmD []  Lindell Spar, PharmD []  Royetta Asal, PharmD []  Graylin Shiver, Rph []  Rema Fendt) Glennon Mac, PharmD []  Arlyn Dunning, PharmD []  Netta Cedars, PharmD []  Dia Sitter, PharmD []  Leone Haven, PharmD []  Gretta Arab, PharmD []  Theodis Shove, PharmD []  Peggyann Juba, PharmD []  Reuel Boom, PharmD   Positive wound culture Treated with clindamycin, also an inpatient @ [facility] ,no further patient follow-up is required at this time.  Hazle Nordmann 08/30/2019, 1:50 PM

## 2019-08-30 NOTE — Progress Notes (Signed)
On call DR. Was paged twice at 930 and around 1000pm with no response

## 2019-08-30 NOTE — Progress Notes (Signed)
PROGRESS NOTE    Larry [name]  [id number] DOB: 20-Jul-1951 DOA: 08/28/2019 PCP: Merrilee Seashore, MD    Brief Narrative:  68 y.o.malewith medical history significant ofOSA not on CPAP; hypothyroidism; and hemochromatosis presenting witharm pain. He reports h/o prior bump on his elbow which has been bothering him intermittently for the past week.  Noted left upper extremity was progressively getting red and swollen.  Presented to the ER had some minimal drainage, sent for culture and was discharged home on p.o. antibiotics.  Patient noted worsening symptoms and decided to come back again to the ER.  Patient has had a history of MRSA infection in the past which was pretty severe.  The ED, patient remained afebrile, vital signs somewhat stable.  Orthopedics consulted.  Ultrasound ordered which showed no drainable abscess. Patient admitted for further management.  Assessment & Plan:   Principal Problem:   Cellulitis of arm Active Problems:   Hypothyroidism   Essential hypertension   Chronic pain  LUE cellulitis -Currently afebrile, with mild leukocytosis -BC x2 reviewed, neg -Culture of drainage done on 08/27/2019 growing few staph aureus, susceptibilities, MRSA noted -USS appears to indicate lack of abscess or drainable fluid collection Orthopedics on board, will follow Continue IV vancomycin for another day, may be discharged tomorrow if improvement -Discussed case with Orthopedic Surgery. Plan to f/u on MRI of L elbow  Chronic pain -Continue Norco as needed  HTN -Continue Cozaar, Verapamil  Hypothyroidism -TSH 1.567 -Continue Synthroid as tolerated  BPH -Continue Flomax -Seems stable at this time  DVT prophylaxis: Lovenox subQ Code Status: Full Family Communication: Pt in room, family not at bedside Disposition Plan: Uncertain at this time  Consultants:   Orthopedic Surgery  Procedures:     Antimicrobials: Anti-infectives (From admission,  onward)   Start     Dose/Rate Route Frequency Ordered Stop   08/28/19 0930  vancomycin (VANCOCIN) IVPB 750 mg/150 ml premix     750 mg 150 mL/hr over 60 Minutes Intravenous Every 12 hours 08/28/19 0915         Subjective: Without complaints at this time  Objective: Vitals:   08/29/19 1845 08/29/19 2339 08/30/19 0543 08/30/19 1336  BP: 128/64 (!) 144/89 (!) 160/87 136/72  Pulse: 62 70 63 64  Resp: 18 16 16 16   Temp: 98.8 F (37.1 C) 98.3 F (36.8 C) 98 F (36.7 C) (!) 97.3 F (36.3 C)  TempSrc: Oral Oral Oral Oral  SpO2: 97% 97% 97% 95%  Weight:        Intake/Output Summary (Last 24 hours) at 08/30/2019 1555 Last data filed at 08/30/2019 0300 Gross per 24 hour  Intake 750 ml  Output -  Net 750 ml   Filed Weights   08/28/19 [id number]  Weight: 74.4 kg    Examination:  General exam: Appears calm and comfortable  Respiratory system: Clear to auscultation. Respiratory effort normal. Cardiovascular system: S1 & S2 heard, Regular Gastrointestinal system: Abdomen is nondistended, soft and nontender. No organomegaly or masses felt. Normal bowel sounds heard. Central nervous system: Alert and oriented. No focal neurological deficits. Extremities: Symmetric 5 x 5 power. Skin: L elbow erythematous with open ulceration Psychiatry: Judgement and insight appear normal. Mood & affect appropriate.   Data Reviewed: I have personally reviewed following labs and imaging studies  CBC: Recent Labs  Lab 08/27/19 1308 08/28/19 0716 08/29/19 0620 08/30/19 0548  WBC 8.7 11.7* 10.7* 8.1  NEUTROABS 7.0 9.8*  --  5.8  HGB 16.9 17.3* 16.4 15.7  HCT  47.4 49.8 47.8 44.6  MCV 97.3 98.2 98.0 95.9  PLT 154 151 151 [id number]*   Basic Metabolic Panel: Recent Labs  Lab 08/27/19 1308 08/28/19 0716 08/29/19 0620 08/30/19 0548  NA 139 138 139 139  K 4.3 4.5 4.4 3.9  CL 104 102 102 101  CO2 25 26 21* 27  GLUCOSE 105* 118* 99 102*  BUN 14 12 10 9   CREATININE 0.89 0.96 0.85 0.87  CALCIUM 9.0  9.0 8.7* 8.5*   GFR: Estimated Creatinine Clearance: 74.4 mL/min (by C-G formula based on SCr of 0.87 mg/dL). Liver Function Tests: Recent Labs  Lab 08/27/19 1308 08/28/19 0716  AST 28 27  ALT 29 25  ALKPHOS 45 49  BILITOT 1.5* 1.8*  PROT 6.4* 6.5  ALBUMIN 3.9 3.7   No results for input(s): LIPASE, AMYLASE in the last 168 hours. No results for input(s): AMMONIA in the last 168 hours. Coagulation Profile: Recent Labs  Lab 08/28/19 0716  INR 1.1   Cardiac Enzymes: No results for input(s): CKTOTAL, CKMB, CKMBINDEX, TROPONINI in the last 168 hours. BNP (last 3 results) No results for input(s): PROBNP in the last 8760 hours. HbA1C: No results for input(s): HGBA1C in the last 72 hours. CBG: No results for input(s): GLUCAP in the last 168 hours. Lipid Profile: No results for input(s): CHOL, HDL, LDLCALC, TRIG, CHOLHDL, LDLDIRECT in the last 72 hours. Thyroid Function Tests: Recent Labs    08/28/19 1342  TSH 1.567   Anemia Panel: No results for input(s): VITAMINB12, FOLATE, FERRITIN, TIBC, IRON, RETICCTPCT in the last 72 hours. Sepsis Labs: Recent Labs  Lab 08/28/19 0716  LATICACIDVEN 1.4    Recent Results (from the past 240 hour(s))  Wound or Superficial Culture     Status: None   Collection Time: 08/27/19  3:41 PM   Specimen: Abscess; Wound  Result Value Ref Range Status   Specimen Description ABSCESS  Final   Special Requests Normal  Final   Gram Stain   Final    FEW WBC PRESENT, PREDOMINANTLY PMN FEW GRAM POSITIVE COCCI IN PAIRS IN CLUSTERS Performed at [facility], [address] [place], [place] [address]    Culture FEW METHICILLIN RESISTANT STAPHYLOCOCCUS AUREUS  Final   Report Status 08/29/2019 FINAL  Final   Organism ID, Bacteria METHICILLIN RESISTANT STAPHYLOCOCCUS AUREUS  Final      Susceptibility   Methicillin resistant staphylococcus aureus - MIC*    CIPROFLOXACIN >=8 RESISTANT Resistant     ERYTHROMYCIN >=8 RESISTANT Resistant      GENTAMICIN <=0.5 SENSITIVE Sensitive     OXACILLIN >=4 RESISTANT Resistant     TETRACYCLINE <=1 SENSITIVE Sensitive     VANCOMYCIN <=0.5 SENSITIVE Sensitive     TRIMETH/SULFA <=10 SENSITIVE Sensitive     CLINDAMYCIN <=0.25 SENSITIVE Sensitive     RIFAMPIN <=0.5 SENSITIVE Sensitive     Inducible Clindamycin NEGATIVE Sensitive     * FEW METHICILLIN RESISTANT STAPHYLOCOCCUS AUREUS  Culture, blood (Routine x 2)     Status: None (Preliminary result)   Collection Time: 08/28/19  7:16 AM   Specimen: BLOOD  Result Value Ref Range Status   Specimen Description BLOOD RIGHT ANTECUBITAL  Final   Special Requests   Final    BOTTLES DRAWN AEROBIC AND ANAEROBIC Blood Culture adequate volume   Culture   Final    NO GROWTH 2 DAYS Performed at [facility], [address] [place], [place] [address]    Report Status PENDING  Incomplete  Culture, blood (Routine x 2)     Status: None (Preliminary result)   Collection Time: 08/28/19  7:16 AM   Specimen: BLOOD  Result Value Ref Range Status   Specimen Description BLOOD LEFT ANTECUBITAL  Final   Special Requests   Final    BOTTLES DRAWN AEROBIC AND ANAEROBIC Blood Culture adequate volume   Culture   Final    NO GROWTH 2 DAYS Performed at [facility], [address] [place], [place] [address]    Report Status PENDING  Incomplete  SARS CORONAVIRUS 2 (TAT 6-24 HRS) Nasopharyngeal Nasopharyngeal Swab     Status: None   Collection Time: 08/28/19  9:25 AM   Specimen: Nasopharyngeal Swab  Result Value Ref Range Status   SARS Coronavirus 2 NEGATIVE NEGATIVE Final    Comment: (NOTE) SARS-CoV-2 target nucleic acids are NOT DETECTED. The SARS-CoV-2 RNA is generally detectable in upper and lower respiratory specimens during the acute phase of infection. Negative results do not preclude SARS-CoV-2 infection, do not rule out co-infections with other pathogens, and should not be used as the sole basis for treatment or other patient management  decisions. Negative results must be combined with clinical observations, patient history, and epidemiological information. The expected result is Negative. Fact Sheet for Patients: SugarRoll.be Fact Sheet for Healthcare Providers: https://www.woods-mathews.com/ This test is not yet approved or cleared by the [place] FDA and  has been authorized for detection and/or diagnosis of SARS-CoV-2 by FDA under an Emergency Use Authorization (EUA). This EUA will remain  in effect (meaning this test can be used) for the duration of the COVID-19 declaration under Section 56 4(b)(1) of the Act, 21 U.S.C. section 360bbb-3(b)(1), unless the authorization is terminated or revoked sooner. Performed at [facility], [place] [address] [place], [place] [address]   MRSA PCR Screening     Status: None   Collection Time: 08/29/19 10:53 PM   Specimen: Nasal Mucosa; Nasopharyngeal  Result Value Ref Range Status   MRSA by PCR NEGATIVE NEGATIVE Final    Comment:        The GeneXpert MRSA Assay (FDA approved for NASAL specimens only), is one component of a comprehensive MRSA colonization surveillance program. It is not intended to diagnose MRSA infection nor to guide or monitor treatment for MRSA infections. Performed at [facility], [place] [address], [place], [place] [address]      Radiology Studies: No results found.  Scheduled Meds: . aspirin EC  81 mg Oral Daily  . docusate sodium  100 mg Oral BID  . enoxaparin (LOVENOX) injection  40 mg Subcutaneous Q24H  . latanoprost  1 drop Both Eyes QHS  . levothyroxine  200 mcg Oral QAC breakfast  . LORazepam  1 mg Oral QHS  . losartan  50 mg Oral Daily  . meloxicam  15 mg Oral Daily  . tamsulosin  0.4 mg Oral Daily  . verapamil  180 mg Oral Daily   Continuous Infusions: . vancomycin 750 mg (08/30/19 1013)     LOS: 1 day   Marylu Lund, MD [facility] Pager On Amion  If  7PM-7AM, please contact night-coverage 08/30/2019, 3:55 PM

## 2019-08-30 NOTE — Progress Notes (Addendum)
Patient sitting up in bed. Notes no new pain or redness. Denies fever, chills.   Erythema of right arm looks largely the same as yesterday. No swelling of olecranon bursa or around the joint. No limitation in ROM of left elbow. No pain with ROM or left elbow. No new fluctuance around site of original drainage.   US showed no drainable abscess. Blood cultures show no growth thus far. Culture of drainage performed on 11/29 showed few MRSA.   As of right now, cellulitis does not appear to have progressed into the elbow joint. No need for surgical intervention at this time. Agree with plan to continue vancomycin, hopefully we will see some improvement of erythema tomorrow. Going to go ahead and order MRI of left arm to make sure that we are not missing anything since he has not yet had significant improvement with the vancomycin over 3 days.

## 2019-08-30 NOTE — Progress Notes (Signed)
Patient could not handle contrast imaging due to pain in the position he was in. Patient was asked to change positions prior to contrast and refused. Patient did receive all pre contrast imaging on both forearm and humerus; patient understood and would be willing to do contrast IF needed tomorrow with medication;

## 2019-08-31 LAB — CBC WITH DIFFERENTIAL/PLATELET
Abs Immature Granulocytes: 0.08 10*3/uL — ABNORMAL HIGH (ref 0.00–0.07)
Basophils Absolute: 0 10*3/uL (ref 0.0–0.1)
Basophils Relative: 1 %
Eosinophils Absolute: 0.3 10*3/uL (ref 0.0–0.5)
Eosinophils Relative: 4 %
HCT: 46.6 % (ref 39.0–52.0)
Hemoglobin: 16 g/dL (ref 13.0–17.0)
Immature Granulocytes: 1 %
Lymphocytes Relative: 19 %
Lymphs Abs: 1.4 10*3/uL (ref 0.7–4.0)
MCH: 33.8 pg (ref 26.0–34.0)
MCHC: 34.3 g/dL (ref 30.0–36.0)
MCV: 98.3 fL (ref 80.0–100.0)
Monocytes Absolute: 0.7 10*3/uL (ref 0.1–1.0)
Monocytes Relative: 9 %
Neutro Abs: 4.9 10*3/uL (ref 1.7–7.7)
Neutrophils Relative %: 66 %
Platelets: 169 10*3/uL (ref 150–400)
RBC: 4.74 MIL/uL (ref 4.22–5.81)
RDW: 13.2 % (ref 11.5–15.5)
WBC: 7.5 10*3/uL (ref 4.0–10.5)
nRBC: 0 % (ref 0.0–0.2)

## 2019-08-31 LAB — BASIC METABOLIC PANEL
Anion gap: 11 (ref 5–15)
BUN: 11 mg/dL (ref 8–23)
CO2: 30 mmol/L (ref 22–32)
Calcium: 8.9 mg/dL (ref 8.9–10.3)
Chloride: 100 mmol/L (ref 98–111)
Creatinine, Ser: 0.97 mg/dL (ref 0.61–1.24)
GFR calc Af Amer: >60 mL/min (ref >60)
GFR calc non Af Amer: >60 mL/min (ref >60)
Glucose, Bld: 96 mg/dL (ref 70–99)
Potassium: 4.4 mmol/L (ref 3.5–5.1)
Sodium: 141 mmol/L (ref 135–145)

## 2019-08-31 MED ORDER — TESTOSTERONE CYPIONATE 200 MG/ML IM SOLN: 100.0000 mg | INTRAMUSCULAR | Status: DC

## 2019-08-31 MED ORDER — TESTOSTERONE CYPIONATE 200 MG/ML IM SOLN
100.0000 mg | INTRAMUSCULAR | Status: DC
  Administered 2019-09-01: 100 mg via INTRAMUSCULAR
  Filled 2019-08-31: qty 0.5

## 2019-08-31 NOTE — Progress Notes (Signed)
PROGRESS NOTE    Larry [name]  [id number] DOB: 12/20/1950 DOA: 08/28/2019 PCP: Merrilee Seashore, MD    Brief Narrative:  68 y.o.malewith medical history significant ofOSA not on CPAP; hypothyroidism; and hemochromatosis presenting witharm pain. He reports h/o prior bump on his elbow which has been bothering him intermittently for the past week.  Noted left upper extremity was progressively getting red and swollen.  Presented to the ER had some minimal drainage, sent for culture and was discharged home on p.o. antibiotics.  Patient noted worsening symptoms and decided to come back again to the ER.  Patient has had a history of MRSA infection in the past which was pretty severe.  The ED, patient remained afebrile, vital signs somewhat stable.  Orthopedics consulted.  Ultrasound ordered which showed no drainable abscess. Patient admitted for further management.  Assessment & Plan:   Principal Problem:   Cellulitis of arm Active Problems:   Hypothyroidism   Essential hypertension   Chronic pain  LUE cellulitis -Currently afebrile, with mild leukocytosis -BC x2 reviewed, neg -Culture of drainage done on 08/27/2019 growing few staph aureus, susceptibilities, MRSA noted -USS appears to indicate lack of abscess or drainable fluid collection Orthopedics on board, will follow -MRI L elbow without osteo or joint involvement. Is notable for cellulitis -Per orthopedic surgery, recommendation to continue current abx and long arm splint  Chronic pain -Continue Norco as needed  HTN -Continue Cozaar, Verapamil  Hypothyroidism -TSH 1.567 -Continue Synthroid as tolerated  BPH -Continue Flomax -Seems stable at this time  DVT prophylaxis: Lovenox subQ Code Status: Full Family Communication: Pt in room, family not at bedside Disposition Plan: Uncertain at this time  Consultants:   Orthopedic Surgery  Procedures:     Antimicrobials: Anti-infectives (From  admission, onward)   Start     Dose/Rate Route Frequency Ordered Stop   08/28/19 0930  vancomycin (VANCOCIN) IVPB 750 mg/150 ml premix     750 mg 150 mL/hr over 60 Minutes Intravenous Every 12 hours 08/28/19 0915        Subjective: No complaints this AM  Objective: Vitals:   08/30/19 1336 08/30/19 1834 08/30/19 2358 08/31/19 0626  BP: 136/72 132/71 (!) 149/86 (!) 147/78  Pulse: 64 64 65 (!) 55  Resp: 16 18 15 16   Temp: (!) 97.3 F (36.3 C) 97.9 F (36.6 C) 97.8 F (36.6 C) (!) 97.5 F (36.4 C)  TempSrc: Oral Oral Oral Oral  SpO2: 95% 95% 96% 98%  Weight:        Intake/Output Summary (Last 24 hours) at 08/31/2019 1629 Last data filed at 08/30/2019 2020 Gross per 24 hour  Intake 120 ml  Output -  Net 120 ml   Filed Weights   08/28/19 [id number]  Weight: 74.4 kg    Examination: General exam: Awake, laying in bed, in nad Respiratory system: Normal respiratory effort, no wheezing Cardiovascular system: regular rate, s1, s2 Gastrointestinal system: Soft, nondistended, positive BS Central nervous system: CN2-12 grossly intact, strength intact Extremities: Perfused, no clubbing, L arm in elevated splint Skin: Normal skin turgor, no notable skin lesions seen, L arm in splint Psychiatry: Mood normal // no visual hallucinations    Data Reviewed: I have personally reviewed following labs and imaging studies  CBC: Recent Labs  Lab 08/27/19 1308 08/28/19 0716 08/29/19 0620 08/30/19 0548 08/31/19 0537  WBC 8.7 11.7* 10.7* 8.1 7.5  NEUTROABS 7.0 9.8*  --  5.8 4.9  HGB 16.9 17.3* 16.4 15.7 16.0  HCT 47.4 49.8 47.8 44.6  46.6  MCV 97.3 98.2 98.0 95.9 98.3  PLT 154 151 151 133* [id number]   Basic Metabolic Panel: Recent Labs  Lab 08/27/19 1308 08/28/19 0716 08/29/19 0620 08/30/19 0548 08/31/19 0537  NA 139 138 139 139 141  K 4.3 4.5 4.4 3.9 4.4  CL 104 102 102 101 100  CO2 25 26 21* 27 30  GLUCOSE 105* 118* 99 102* 96  BUN 14 12 10 9 11   CREATININE 0.89 0.96 0.85 0.87  0.97  CALCIUM 9.0 9.0 8.7* 8.5* 8.9   GFR: Estimated Creatinine Clearance: 66.7 mL/min (by C-G formula based on SCr of 0.97 mg/dL). Liver Function Tests: Recent Labs  Lab 08/27/19 1308 08/28/19 0716  AST 28 27  ALT 29 25  ALKPHOS 45 49  BILITOT 1.5* 1.8*  PROT 6.4* 6.5  ALBUMIN 3.9 3.7   No results for input(s): LIPASE, AMYLASE in the last 168 hours. No results for input(s): AMMONIA in the last 168 hours. Coagulation Profile: Recent Labs  Lab 08/28/19 0716  INR 1.1   Cardiac Enzymes: No results for input(s): CKTOTAL, CKMB, CKMBINDEX, TROPONINI in the last 168 hours. BNP (last 3 results) No results for input(s): PROBNP in the last 8760 hours. HbA1C: No results for input(s): HGBA1C in the last 72 hours. CBG: No results for input(s): GLUCAP in the last 168 hours. Lipid Profile: No results for input(s): CHOL, HDL, LDLCALC, TRIG, CHOLHDL, LDLDIRECT in the last 72 hours. Thyroid Function Tests: No results for input(s): TSH, T4TOTAL, FREET4, T3FREE, THYROIDAB in the last 72 hours. Anemia Panel: No results for input(s): VITAMINB12, FOLATE, FERRITIN, TIBC, IRON, RETICCTPCT in the last 72 hours. Sepsis Labs: Recent Labs  Lab 08/28/19 0716  LATICACIDVEN 1.4    Recent Results (from the past 240 hour(s))  Wound or Superficial Culture     Status: None   Collection Time: 08/27/19  3:41 PM   Specimen: Abscess; Wound  Result Value Ref Range Status   Specimen Description ABSCESS  Final   Special Requests Normal  Final   Gram Stain   Final    FEW WBC PRESENT, PREDOMINANTLY PMN FEW GRAM POSITIVE COCCI IN PAIRS IN CLUSTERS Performed at [facility], [address] [place], [place] [address]    Culture FEW METHICILLIN RESISTANT STAPHYLOCOCCUS AUREUS  Final   Report Status 08/29/2019 FINAL  Final   Organism ID, Bacteria METHICILLIN RESISTANT STAPHYLOCOCCUS AUREUS  Final      Susceptibility   Methicillin resistant staphylococcus aureus - MIC*    CIPROFLOXACIN >=8  RESISTANT Resistant     ERYTHROMYCIN >=8 RESISTANT Resistant     GENTAMICIN <=0.5 SENSITIVE Sensitive     OXACILLIN >=4 RESISTANT Resistant     TETRACYCLINE <=1 SENSITIVE Sensitive     VANCOMYCIN <=0.5 SENSITIVE Sensitive     TRIMETH/SULFA <=10 SENSITIVE Sensitive     CLINDAMYCIN <=0.25 SENSITIVE Sensitive     RIFAMPIN <=0.5 SENSITIVE Sensitive     Inducible Clindamycin NEGATIVE Sensitive     * FEW METHICILLIN RESISTANT STAPHYLOCOCCUS AUREUS  Culture, blood (Routine x 2)     Status: None (Preliminary result)   Collection Time: 08/28/19  7:16 AM   Specimen: BLOOD  Result Value Ref Range Status   Specimen Description BLOOD RIGHT ANTECUBITAL  Final   Special Requests   Final    BOTTLES DRAWN AEROBIC AND ANAEROBIC Blood Culture adequate volume   Culture   Final    NO GROWTH 3 DAYS Performed at [facility], [address] [place],  [place] [address]    Report Status PENDING  Incomplete  Culture, blood (Routine x 2)     Status: None (Preliminary result)   Collection Time: 08/28/19  7:16 AM   Specimen: BLOOD  Result Value Ref Range Status   Specimen Description BLOOD LEFT ANTECUBITAL  Final   Special Requests   Final    BOTTLES DRAWN AEROBIC AND ANAEROBIC Blood Culture adequate volume   Culture   Final    NO GROWTH 3 DAYS Performed at [facility], [place] [address] [place], [place] [address]    Report Status PENDING  Incomplete  SARS CORONAVIRUS 2 (TAT 6-24 HRS) Nasopharyngeal Nasopharyngeal Swab     Status: None   Collection Time: 08/28/19  9:25 AM   Specimen: Nasopharyngeal Swab  Result Value Ref Range Status   SARS Coronavirus 2 NEGATIVE NEGATIVE Final    Comment: (NOTE) SARS-CoV-2 target nucleic acids are NOT DETECTED. The SARS-CoV-2 RNA is generally detectable in upper and lower respiratory specimens during the acute phase of infection. Negative results do not preclude SARS-CoV-2 infection, do not rule out co-infections with other pathogens, and should not be  used as the sole basis for treatment or other patient management decisions. Negative results must be combined with clinical observations, patient history, and epidemiological information. The expected result is Negative. Fact Sheet for Patients: SugarRoll.be Fact Sheet for Healthcare Providers: https://www.woods-mathews.com/ This test is not yet approved or cleared by the [place] FDA and  has been authorized for detection and/or diagnosis of SARS-CoV-2 by FDA under an Emergency Use Authorization (EUA). This EUA will remain  in effect (meaning this test can be used) for the duration of the COVID-19 declaration under Section 56 4(b)(1) of the Act, 21 U.S.C. section 360bbb-3(b)(1), unless the authorization is terminated or revoked sooner. Performed at [facility], [place] [address], [place], [place] [address]   MRSA PCR Screening     Status: None   Collection Time: 08/29/19 10:53 PM   Specimen: Nasal Mucosa; Nasopharyngeal  Result Value Ref Range Status   MRSA by PCR NEGATIVE NEGATIVE Final    Comment:        The GeneXpert MRSA Assay (FDA approved for NASAL specimens only), is one component of a comprehensive MRSA colonization surveillance program. It is not intended to diagnose MRSA infection nor to guide or monitor treatment for MRSA infections. Performed at [facility], [place] [address], [place], [place] [address]      Radiology Studies: Mr Humerus Left Wo Contrast  Result Date: 08/30/2019 CLINICAL DATA:  Cellulitis, question osteomyelitis EXAM: MRI OF THE LEFT HUMERUS WITHOUT CONTRAST TECHNIQUE: Multiplanar, multisequence MR imaging of the left forearm was performed. No intravenous contrast was administered. COMPARISON:  None. FINDINGS: Bones/Joint/Cartilage Normal osseous marrow signal seen throughout. No osseous fracture, pathologic marrow infiltration, cortical destruction or periosteal reaction. The joint spaces  appear to be well maintained. There is a trace glenohumeral joint effusion. Ligaments Suboptimally visualized Muscles and Tendons Normal appearance of the muscles. No evidence of muscular atrophy or tear. The tendons appear to be intact. There is a small amount of fluid seen surrounding the biceps tendon. Soft tissues There is diffuse subcutaneous edema seen along the lateral aspect of the humerus extending posteriorly at the level of the elbow. No loculated fluid collection is noted. Small probable lymph nodes are seen along the posterior aspect of the shoulder. There is diffuse skin thickening. IMPRESSION: Findings suggestive of cellulitis as described above. No evidence of osteomyelitis  or abscess. Electronically Signed   By: Prudencio Pair M.D.   On: 08/30/2019 22:57   Mr Forearm Left Wo Contrast  Result Date: 08/30/2019 CLINICAL DATA:  Soft tissue swelling, cellulitis, question of osteomyelitis EXAM: MRI OF THE LEFT FOREARM WITHOUT CONTRAST TECHNIQUE: Multiplanar, multisequence MR imaging of the left forearm was performed. No intravenous contrast was administered. COMPARISON:  None. FINDINGS: Bones/Joint/Cartilage Normal osseous marrow signal seen throughout. No cortical destruction or periosteal reaction. No marrow edema or pathologic marrow infiltration seen. The joint spaces appear to be maintained. No large joint effusions. Ligaments Suboptimally visualized Muscles and Tendons Normal signal seen throughout the musculature. No evidence of muscular tear or atrophy. The tendons appear to be intact. No evidence of tendinopathy or tear. Soft tissues Diffuse subcutaneous edema is seen surrounding the forearm. No loculated fluid collection is noted. There is diffuse skin thickening. IMPRESSION: Findings suggestive of diffuse cellulitis surrounding the forearm. No evidence of osteomyelitis or definite abscess. Electronically Signed   By: Prudencio Pair M.D.   On: 08/30/2019 22:55    Scheduled Meds: . aspirin EC   81 mg Oral Daily  . docusate sodium  100 mg Oral BID  . enoxaparin (LOVENOX) injection  40 mg Subcutaneous Q24H  . latanoprost  1 drop Both Eyes QHS  . levothyroxine  200 mcg Oral QAC breakfast  . LORazepam  1 mg Oral QHS  . losartan  50 mg Oral Daily  . meloxicam  15 mg Oral Daily  . tamsulosin  0.4 mg Oral Daily  . verapamil  180 mg Oral Daily   Continuous Infusions: . vancomycin 750 mg (08/31/19 0939)     LOS: 2 days   Marylu Lund, MD [facility] Pager On Amion  If 7PM-7AM, please contact night-coverage 08/31/2019, 4:29 PM

## 2019-08-31 NOTE — Progress Notes (Signed)
Pharmacy Antibiotic Note  Larry [name] is a 68 y.o. male admitted on 08/28/2019 with L forearm abscess/cellulitis.Marland Kitchen  Pharmacy has been consulted for Vancomcyin dosing.  ID: L forearm abscess/cellulitis. Wbc 7.5 down down. Afebrile. LA 1.4.  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  - In ED on 11/29 recevied vanc 1000mg  + I&D. Nothing further to drain at this time.  Vancomycin 750 mg IV Q 12 hrs. Goal AUC 400-550. Expected AUC: 464 SCr used: 0.96  Vanc 11/30 >>   11/30 Bcx: 11/29 Abscess: MRSA - 12/1: MrSA PCR: negative  Plan: Vanc 750mg  IV q12h  Discharge on Doxy vs Septra today?    Weight: 164 lb 0.4 oz (74.4 kg)  Temp (24hrs), Avg:97.6 F (36.4 C), Min:97.3 F (36.3 C), Max:97.9 F (36.6 C)  Recent Labs  Lab 08/27/19 1308 08/28/19 0716 08/29/19 0620 08/30/19 0548 08/31/19 0537  WBC 8.7 11.7* 10.7* 8.1 7.5  CREATININE 0.89 0.96 0.85 0.87 0.97  LATICACIDVEN  --  1.4  --   --   --     Estimated Creatinine Clearance: 66.7 mL/min (by C-G formula based on SCr of 0.97 mg/dL).    No Known Allergies  Norval Slaven S. Alford Highland, PharmD, BCPS Clinical Staff Pharmacist  Eilene Ghazi Stillinger 08/31/2019 10:08 AM

## 2019-08-31 NOTE — Progress Notes (Addendum)
     Subjective: 68 yo male with pain and redness of left arm. He has a history of OSA, hypothyroidism, and hemochromatosis. Patient noticed bump on right arm with swelling and redness and presented to ED on 11/29. In ED, bump was incised, drained, and cultured. He was sent home on clindamycin. He returned to ED on 11/30 because he noticed increased swelling and redness progressing up forearm. US showed no drainable abscess. He was admitted and started on vancomycin. He has remained afebrile since admission.   He does have a history of a MRSA infection which was severe and was treated with vancomycin.   Today patient reports pain is mild though increased during MRI due to holding his arm in the same position for 45 minutes. He has been keeping left arm in elevator sling.   Objective:   VITALS:   Vitals:   08/30/19 1336 08/30/19 1834 08/30/19 2358 08/31/19 0626  BP: 136/72 132/71 (!) 149/86 (!) 147/78  Pulse: 64 64 65 (!) 55  Resp: 16 18 15 16   Temp: (!) 97.3 F (36.3 C) 97.9 F (36.6 C) 97.8 F (36.6 C) (!) 97.5 F (36.4 C)  TempSrc: Oral Oral Oral Oral  SpO2: 95% 95% 96% 98%  Weight:       Well developed, well nourished. In no acute distress.  ABD soft Neurovascular intact Sensation intact distally Intact pulses distally  Full ROM of left elbow and wrist. No TTP of olecranon bursa. TTP of left proximal forearm. Erythema extending from mid forearm to mid upper arm. Skin is warm and dry. No fluctuance around punctate wound. No drainage.           Lab Results  Component Value Date   WBC 7.5 08/31/2019   HGB 16.0 08/31/2019   HCT 46.6 08/31/2019   MCV 98.3 08/31/2019   PLT 169 08/31/2019   BMET    Component Value Date/Time   NA 141 08/31/2019 0537   K 4.4 08/31/2019 0537   CL 100 08/31/2019 0537   CO2 30 08/31/2019 0537   GLUCOSE 96 08/31/2019 0537   BUN 11 08/31/2019 0537   CREATININE 0.97 08/31/2019 0537   CALCIUM 8.9 08/31/2019 0537   GFRNONAA >60 08/31/2019  0537   GFRAA >60 08/31/2019 0537     Assessment/Plan:     Principal Problem:   Cellulitis of arm Active Problems:   Hypothyroidism   Essential hypertension   Chronic pain  US showed no drainable abscess. MRIs showed no signs of oseomylitis, showed diffuse cellulitis. Elevator sling ordered yesterday, patient advised to continue to use this. Long arm splint ordered, can be removed for dressing, hygiene, and physical exam. We will plan to continue current antibiotic treatment.      Ventura Bruns 08/31/2019, 7:06 AM

## 2019-08-31 NOTE — Progress Notes (Signed)
Orthopedic Tech Progress Note Patient Details:  Larry [name] 15-Oct-1950 [id number]  Ortho Devices Type of Ortho Device: Ace wrap, Long arm splint Ortho Device/Splint Location: left Ortho Device/Splint Interventions: Application   Post Interventions Patient Tolerated: Well Instructions Provided: Care of device   [name] 08/31/2019, 11:06 AM

## 2019-08-31 NOTE — Progress Notes (Signed)
MR without abscess.  Continue with cellulitis treatment.  Could consider splinting and mission sling if not clinically improving.  No surgery indicated.    Johnny Bridge, MD

## 2019-09-01 LAB — CBC WITH DIFFERENTIAL/PLATELET
Abs Immature Granulocytes: 0.12 10*3/uL — ABNORMAL HIGH (ref 0.00–0.07)
Basophils Absolute: 0.1 10*3/uL (ref 0.0–0.1)
Basophils Relative: 1 %
Eosinophils Absolute: 0.3 10*3/uL (ref 0.0–0.5)
Eosinophils Relative: 4 %
HCT: 44.2 % (ref 39.0–52.0)
Hemoglobin: 15.5 g/dL (ref 13.0–17.0)
Immature Granulocytes: 2 %
Lymphocytes Relative: 20 %
Lymphs Abs: 1.6 10*3/uL (ref 0.7–4.0)
MCH: 33.8 pg (ref 26.0–34.0)
MCHC: 35.1 g/dL (ref 30.0–36.0)
MCV: 96.3 fL (ref 80.0–100.0)
Monocytes Absolute: 0.8 10*3/uL (ref 0.1–1.0)
Monocytes Relative: 11 %
Neutro Abs: 4.8 10*3/uL (ref 1.7–7.7)
Neutrophils Relative %: 62 %
Platelets: 165 10*3/uL (ref 150–400)
RBC: 4.59 MIL/uL (ref 4.22–5.81)
RDW: 13.2 % (ref 11.5–15.5)
WBC: 7.7 10*3/uL (ref 4.0–10.5)
nRBC: 0 % (ref 0.0–0.2)

## 2019-09-01 LAB — BASIC METABOLIC PANEL
Anion gap: 10 (ref 5–15)
BUN: 10 mg/dL (ref 8–23)
CO2: 26 mmol/L (ref 22–32)
Calcium: 8.8 mg/dL — ABNORMAL LOW (ref 8.9–10.3)
Chloride: 103 mmol/L (ref 98–111)
Creatinine, Ser: 0.8 mg/dL (ref 0.61–1.24)
GFR calc Af Amer: >60 mL/min (ref >60)
GFR calc non Af Amer: >60 mL/min (ref >60)
Glucose, Bld: 95 mg/dL (ref 70–99)
Potassium: 3.9 mmol/L (ref 3.5–5.1)
Sodium: 139 mmol/L (ref 135–145)

## 2019-09-01 MED ORDER — SULFAMETHOXAZOLE-TRIMETHOPRIM 800-160 MG PO TABS: 1.0000 | ORAL_TABLET | Freq: Two times a day (BID) | ORAL | 0 refills | Status: DC

## 2019-09-01 MED ORDER — SULFAMETHOXAZOLE-TRIMETHOPRIM 800-160 MG PO TABS: 1.0000 | ORAL_TABLET | Freq: Two times a day (BID) | ORAL | 0 refills | Status: AC

## 2019-09-01 MED ORDER — VANCOMYCIN HCL IN DEXTROSE 750-5 MG/150ML-% IV SOLN
750.0000 mg | Freq: Two times a day (BID) | INTRAVENOUS | Status: DC
  Administered 2019-09-01: 16:00:00 750 mg via INTRAVENOUS
  Filled 2019-09-01 (×2): qty 150

## 2019-09-01 MED FILL — SULFAMETHOXAZOLE-TMP DS TAB: 800-160 | 5 days supply | Qty: 10 | Fill #0

## 2019-09-01 NOTE — Progress Notes (Signed)
     Subjective: 68 yo male with pain and redness of left arm. Hx of OSA, hypothyroidism, and hemochromatosis. Patient noticed bump on right forearm with swelling and redness and presented to ED on 11/29. In ED, bump was incised, drained, and cultured. He was sent home on clindamycin. He returned to ED on 11/30 due to increased swelling and redness progressing up forearm. US showed no drainable abscess. He was admitted and started on vancomycin. He has remained afebrile since admission.   He does have a history of a MRSA infection which was severe and was treated with vancomycin.  Today pain is mild and located only around small wound on forearm. Patient feels forearm wound has decreased in size and arm does not feel as "tight". He has been using the splint and sling elevator since yesterday afternoon.   Objective:   VITALS:   Vitals:   08/31/19 1220 08/31/19 1826 09/01/19 0012 09/01/19 0615  BP: 140/82 138/78 (!) 148/71 (!) 149/73  Pulse: 60 (!) 58 (!) 54 (!) 55  Resp: 18 20 15 16   Temp: 98.4 F (36.9 C) 98.3 F (36.8 C) 97.8 F (36.6 C) 97.7 F (36.5 C)  TempSrc: Oral Oral Oral Oral  SpO2: 98% 98% 95% 97%  Weight:       Well developed, well nourished. In no acute distress.  ABD soft Neurovascular intact Sensation intact distally Intact pulses distally  Full ROM of left elbow and wrist. No TTP of olecranon bursa. TTP of left proximal forearm, however significantly improved since yesterday. Erythema of arm has decreased in size. Extends from distal upper to mid forearm.    Skin is warm and dry. No fluctuance around punctate wound. No drainage.    Lab Results  Component Value Date   WBC 7.7 09/01/2019   HGB 15.5 09/01/2019   HCT 44.2 09/01/2019   MCV 96.3 09/01/2019   PLT 165 09/01/2019   BMET    Component Value Date/Time   NA 139 09/01/2019 0500   K 3.9 09/01/2019 0500   CL 103 09/01/2019 0500   CO2 26 09/01/2019 0500   GLUCOSE 95 09/01/2019 0500   BUN 10 09/01/2019  0500   CREATININE 0.80 09/01/2019 0500   CALCIUM 8.8 (L) 09/01/2019 0500   GFRNONAA >60 09/01/2019 0500   GFRAA >60 09/01/2019 0500     Assessment/Plan:     Principal Problem:   Cellulitis of arm Active Problems:   Hypothyroidism   Essential hypertension   Chronic pain  US showed no drainable abscess. MRIs showed no signs of oseomylitis, showed diffuse cellulitis.   Patient to continue to use splint and elevator sling until discharge.   Noted improved of erythema, pain, and size of punctate wound today. Patient most likely ready for discharge on oral antibiotics this afternoon or tomorrow.   Ventura Bruns 09/01/2019, 7:54 AM

## 2019-09-01 NOTE — Progress Notes (Signed)
RN gave pt discharge instructions. Pt stated understanding, pt IV will be removed after last dose of vanc. TOC pharmacy dropped off pt prescriptions. Pt packed all belongings at bedside.

## 2019-09-01 NOTE — Discharge Summary (Signed)
Physician Discharge Summary  Larry [name] [id number] DOB: [birth year] DOA: 08/28/2019  PCP: Merrilee Seashore, MD  Admit date: 08/28/2019 Discharge date: 09/01/2019  Admitted From: Home Disposition:  Home  Recommendations for Outpatient Follow-up:  1. Follow up with PCP in 1-2 weeks  Discharge Condition:Stable CODE STATUS:Full Diet recommendation: Regular   Brief/Interim Summary: 68 y.o.malewith medical history significant ofOSA not on CPAP; hypothyroidism; and hemochromatosis presenting witharm pain. He reports h/o prior bump on his elbowwhich has been bothering him intermittently for the past week. Noted left upper extremity was progressively getting red and swollen. Presented to the ER had some minimal drainage,sent for culture and was discharged home on p.o. antibiotics. Patient noted worsening symptoms and decided to come back again to the ER. Patient has had a history of MRSA infection in the past which was pretty severe. The ED, patient remained afebrile, vital signs somewhat stable. Orthopedics consulted. Ultrasound ordered which showed no drainable abscess. Patient admitted for further management.   Discharge Diagnoses:  Principal Problem:   Cellulitis of arm Active Problems:   Hypothyroidism   Essential hypertension   Chronic pain   LUE cellulitis -Currently afebrile, with mild leukocytosis -BC x2 reviewed, neg -Culture of drainage done on 08/27/2019 growing few staph aureus, susceptibilities, MRSA noted -USappears to indicate lack of abscess or drainable fluid collection Orthopedics on board, will follow -MRI L elbow without osteo or joint involvement. Is notable for cellulitis -Per orthopedic surgery, recommendation to continue current abx and long arm splint -Clinically improved. Would complete 5 more days of bactrim on d/c. Of note, pt was discharged home on bactrim in 2019 for MRSA infection  Chronic pain -Continue Norco as  needed  HTN -Continue Cozaar, Verapamil  Hypothyroidism -TSH1.567 -Continue Synthroid as tolerated  BPH -Continue Flomax -Seems stable at this time   Discharge Instructions   Allergies as of 09/01/2019   No Known Allergies     Medication List    STOP taking these medications   clindamycin 300 MG capsule Commonly known as: CLEOCIN     TAKE these medications   AFRIN NASAL SPRAY NA Place 2 sprays into both nostrils 2 (two) times daily as needed (nasal congestion).   aspirin EC 81 MG tablet Take 81 mg by mouth daily.   furosemide 40 MG tablet Commonly known as: LASIX Take 40 mg by mouth daily as needed for edema.   HYDROcodone-acetaminophen 5-325 MG tablet Commonly known as: Norco Take 1 tablet by mouth every 4 (four) hours as needed for moderate pain.   latanoprost 0.005 % ophthalmic solution Commonly known as: XALATAN Place 1 drop into both eyes at bedtime.   levothyroxine 200 MCG tablet Commonly known as: SYNTHROID Take 200 mcg by mouth daily before breakfast.   LORazepam 0.5 MG tablet Commonly known as: ATIVAN Take 1 mg by mouth at bedtime.   losartan 50 MG tablet Commonly known as: COZAAR Take 50 mg by mouth daily.   meloxicam 15 MG tablet Commonly known as: MOBIC Take 15 mg by mouth daily.   MULTIVITAMIN PO Take 1 tablet by mouth daily.   sulfamethoxazole-trimethoprim 800-160 MG tablet Commonly known as: Bactrim DS Take 1 tablet by mouth 2 (two) times daily for 5 days.   tamsulosin 0.4 MG Caps capsule Commonly known as: FLOMAX Take 0.4 mg by mouth daily.   testosterone cypionate 200 MG/ML injection Commonly known as: DEPOTESTOSTERONE CYPIONATE Inject 100 mg into the muscle every 14 (fourteen) days.   Vayacog 100-19.5-6.5 MG Caps Generic drug: Phosphatidylserine-DHA-EPA Take  1 capsule by mouth daily.   verapamil 180 MG CR tablet Commonly known as: CALAN-SR Take 180 mg by mouth daily.   VIAGRA PO Take 30 mg by mouth daily as  needed (for erectile dysfunction). Purchased from [email or web address]--dose indicated on website as 30 mg      Follow-up Information    Merrilee Seashore, MD. Schedule an appointment as soon as possible for a visit in 1 week(s).   Specialty: Internal Medicine Contact information: [address] [place] [address] [phone]          No Known Allergies  Consultations:  Orthopedic Surgery  Procedures/Studies: Dg Chest 2 View  Result Date: 08/28/2019 CLINICAL DATA:  Pt mention no chest pain-only having midline cp pain when he takes deep breaths x this am. Pt mentioned having a knot removed from left forearm x yesterday, HTN-per pt, past smoker Tech wore surgical mask/gloves, pt had on mask EXAM: CHEST - 2 VIEW COMPARISON:  02/16/2018 FINDINGS: Lungs are clear. Heart size and mediastinal contours are within normal limits. No effusion. No pneumothorax. Cervical and lumbar fixation hardware partially visualized. Anterior vertebral endplate spurring at multiple levels in the mid and lower thoracic spine. IMPRESSION: No acute cardiopulmonary disease. Electronically Signed   By: Lucrezia Europe M.D.   On: 08/28/2019 07:48   Mr Humerus Left Wo Contrast  Result Date: 08/30/2019 CLINICAL DATA:  Cellulitis, question osteomyelitis EXAM: MRI OF THE LEFT HUMERUS WITHOUT CONTRAST TECHNIQUE: Multiplanar, multisequence MR imaging of the left forearm was performed. No intravenous contrast was administered. COMPARISON:  None. FINDINGS: Bones/Joint/Cartilage Normal osseous marrow signal seen throughout. No osseous fracture, pathologic marrow infiltration, cortical destruction or periosteal reaction. The joint spaces appear to be well maintained. There is a trace glenohumeral joint effusion. Ligaments Suboptimally visualized Muscles and Tendons Normal appearance of the muscles. No evidence of muscular atrophy or tear. The tendons appear to be intact. There is a small amount of fluid seen  surrounding the biceps tendon. Soft tissues There is diffuse subcutaneous edema seen along the lateral aspect of the humerus extending posteriorly at the level of the elbow. No loculated fluid collection is noted. Small probable lymph nodes are seen along the posterior aspect of the shoulder. There is diffuse skin thickening. IMPRESSION: Findings suggestive of cellulitis as described above. No evidence of osteomyelitis or abscess. Electronically Signed   By: Prudencio Pair M.D.   On: 08/30/2019 22:57   Mr Forearm Left Wo Contrast  Result Date: 08/30/2019 CLINICAL DATA:  Soft tissue swelling, cellulitis, question of osteomyelitis EXAM: MRI OF THE LEFT FOREARM WITHOUT CONTRAST TECHNIQUE: Multiplanar, multisequence MR imaging of the left forearm was performed. No intravenous contrast was administered. COMPARISON:  None. FINDINGS: Bones/Joint/Cartilage Normal osseous marrow signal seen throughout. No cortical destruction or periosteal reaction. No marrow edema or pathologic marrow infiltration seen. The joint spaces appear to be maintained. No large joint effusions. Ligaments Suboptimally visualized Muscles and Tendons Normal signal seen throughout the musculature. No evidence of muscular tear or atrophy. The tendons appear to be intact. No evidence of tendinopathy or tear. Soft tissues Diffuse subcutaneous edema is seen surrounding the forearm. No loculated fluid collection is noted. There is diffuse skin thickening. IMPRESSION: Findings suggestive of diffuse cellulitis surrounding the forearm. No evidence of osteomyelitis or definite abscess. Electronically Signed   By: Prudencio Pair M.D.   On: 08/30/2019 22:55   [place] Lt Upper Extrem Ltd Soft Tissue Non Vascular  Result Date: 08/28/2019 CLINICAL DATA:  Left elbow cellulitis. EXAM: ULTRASOUND  LEFT UPPER EXTREMITY LIMITED TECHNIQUE: Ultrasound examination of the upper extremity soft tissues was performed in the area of clinical concern. COMPARISON:  None. FINDINGS:  Examination was targeted to the soft tissues of the left elbow. There is generalized subcutaneous edema without focal fluid collection. Intra-articular evaluation was not performed. No large joint effusion identified. IMPRESSION: Subcutaneous edema about the left elbow consistent with cellulitis. No focal fluid collection identified to suggest abscess. Electronically Signed   By: Richardean Sale M.D.   On: 08/28/2019 10:21   Xr Knee 3 View Left  Result Date: 08/09/2019 Films of the left knee were obtained in 3 projections standing and compared to films performed in early 2019.  There is not much change.  There are tricompartmental degenerative changes with near complete collapse of the medial compartment.  There are peripheral osteophytes and subchondral sclerosis medially.  Osteophytes are identified in the lateral compartment and patellofemoral joint.  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    Subjective: Eager to go home  Discharge Exam: Vitals:   09/01/19 0615 09/01/19 1236  BP: (!) 149/73 122/76  Pulse: (!) 55 64  Resp: 16 18  Temp: 97.7 F (36.5 C) 98.1 F (36.7 C)  SpO2: 97% 97%   Vitals:   08/31/19 1826 09/01/19 0012 09/01/19 0615 09/01/19 1236  BP: 138/78 (!) 148/71 (!) 149/73 122/76  Pulse: (!) 58 (!) 54 (!) 55 64  Resp: 20 15 16 18   Temp: 98.3 F (36.8 C) 97.8 F (36.6 C) 97.7 F (36.5 C) 98.1 F (36.7 C)  TempSrc: Oral Oral Oral Oral  SpO2: 98% 95% 97% 97%  Weight:        General: Pt is alert, awake, not in acute distress Cardiovascular: RRR, S1/S2 +, no rubs, no gallops Respiratory: CTA bilaterally, no wheezing, no rhonchi Abdominal: Soft, NT, ND, bowel sounds + Extremities: no edema, no cyanosis   The results of significant diagnostics from this hospitalization (including imaging, microbiology, ancillary and laboratory) are listed below for reference.     Microbiology: Recent Results (from the past 240 hour(s))  Wound or Superficial Culture      Status: None   Collection Time: 08/27/19  3:41 PM   Specimen: Abscess; Wound  Result Value Ref Range Status   Specimen Description ABSCESS  Final   Special Requests Normal  Final   Gram Stain   Final    FEW WBC PRESENT, PREDOMINANTLY PMN FEW GRAM POSITIVE COCCI IN PAIRS IN CLUSTERS Performed at [facility], [address] [place], [place] [address]    Culture FEW METHICILLIN RESISTANT STAPHYLOCOCCUS AUREUS  Final   Report Status 08/29/2019 FINAL  Final   Organism ID, Bacteria METHICILLIN RESISTANT STAPHYLOCOCCUS AUREUS  Final      Susceptibility   Methicillin resistant staphylococcus aureus - MIC*    CIPROFLOXACIN >=8 RESISTANT Resistant     ERYTHROMYCIN >=8 RESISTANT Resistant     GENTAMICIN <=0.5 SENSITIVE Sensitive     OXACILLIN >=4 RESISTANT Resistant     TETRACYCLINE <=1 SENSITIVE Sensitive     VANCOMYCIN <=0.5 SENSITIVE Sensitive     TRIMETH/SULFA <=10 SENSITIVE Sensitive     CLINDAMYCIN <=0.25 SENSITIVE Sensitive     RIFAMPIN <=0.5 SENSITIVE Sensitive     Inducible Clindamycin NEGATIVE Sensitive     * FEW METHICILLIN RESISTANT STAPHYLOCOCCUS AUREUS  Culture, blood (Routine x 2)     Status: None (Preliminary result)   Collection Time: 08/28/19  7:16 AM   Specimen: BLOOD  Result Value Ref Range  Status   Specimen Description BLOOD RIGHT ANTECUBITAL  Final   Special Requests   Final    BOTTLES DRAWN AEROBIC AND ANAEROBIC Blood Culture adequate volume   Culture   Final    NO GROWTH 4 DAYS Performed at [facility], [address], [place], [place] [address]    Report Status PENDING  Incomplete  Culture, blood (Routine x 2)     Status: None (Preliminary result)   Collection Time: 08/28/19  7:16 AM   Specimen: BLOOD  Result Value Ref Range Status   Specimen Description BLOOD LEFT ANTECUBITAL  Final   Special Requests   Final    BOTTLES DRAWN AEROBIC AND ANAEROBIC Blood Culture adequate volume   Culture   Final    NO GROWTH 4 DAYS Performed at [facility], [place] [address], [place], [place] [address]    Report Status PENDING  Incomplete  SARS CORONAVIRUS 2 (TAT 6-24 HRS) Nasopharyngeal Nasopharyngeal Swab     Status: None   Collection Time: 08/28/19  9:25 AM   Specimen: Nasopharyngeal Swab  Result Value Ref Range Status   SARS Coronavirus 2 NEGATIVE NEGATIVE Final    Comment: (NOTE) SARS-CoV-2 target nucleic acids are NOT DETECTED. The SARS-CoV-2 RNA is generally detectable in upper and lower respiratory specimens during the acute phase of infection. Negative results do not preclude SARS-CoV-2 infection, do not rule out co-infections with other pathogens, and should not be used as the sole basis for treatment or other patient management decisions. Negative results must be combined with clinical observations, patient history, and epidemiological information. The expected result is Negative. Fact Sheet for Patients: SugarRoll.be Fact Sheet for Healthcare Providers: https://www.woods-mathews.com/ This test is not yet approved or cleared by the [place] FDA and  has been authorized for detection and/or diagnosis of SARS-CoV-2 by FDA under an Emergency Use Authorization (EUA). This EUA will remain  in effect (meaning this test can be used) for the duration of the COVID-19 declaration under Section 56 4(b)(1) of the Act, 21 U.S.C. section 360bbb-3(b)(1), unless the authorization is terminated or revoked sooner. Performed at [facility], [place] [address] [place], [place] [address]   MRSA PCR Screening     Status: None   Collection Time: 08/29/19 10:53 PM   Specimen: Nasal Mucosa; Nasopharyngeal  Result Value Ref Range Status   MRSA by PCR NEGATIVE NEGATIVE Final    Comment:        The GeneXpert MRSA Assay (FDA approved for NASAL specimens only), is one component of a comprehensive MRSA colonization surveillance program. It is not intended to diagnose MRSA infection nor  to guide or monitor treatment for MRSA infections. Performed at [facility], [place] [address], [place], [place] [address]      Labs: BNP (last 3 results) No results for input(s): BNP in the last 8760 hours. Basic Metabolic Panel: Recent Labs  Lab 08/28/19 0716 08/29/19 0620 08/30/19 0548 08/31/19 0537 09/01/19 0500  NA 138 139 139 141 139  K 4.5 4.4 3.9 4.4 3.9  CL 102 102 101 100 103  CO2 26 21* 27 30 26   GLUCOSE 118* 99 102* 96 95  BUN 12 10 9 11 10   CREATININE 0.96 0.85 0.87 0.97 0.80  CALCIUM 9.0 8.7* 8.5* 8.9 8.8*   Liver Function Tests: Recent Labs  Lab 08/27/19 1308 08/28/19 0716  AST 28 27  ALT 29 25  ALKPHOS 45 49  BILITOT 1.5* 1.8*  PROT 6.4*  6.5  ALBUMIN 3.9 3.7   No results for input(s): LIPASE, AMYLASE in the last 168 hours. No results for input(s): AMMONIA in the last 168 hours. CBC: Recent Labs  Lab 08/27/19 1308 08/28/19 0716 08/29/19 0620 08/30/19 0548 08/31/19 0537 09/01/19 0500  WBC 8.7 11.7* 10.7* 8.1 7.5 7.7  NEUTROABS 7.0 9.8*  --  5.8 4.9 4.8  HGB 16.9 17.3* 16.4 15.7 16.0 15.5  HCT 47.4 49.8 47.8 44.6 46.6 44.2  MCV 97.3 98.2 98.0 95.9 98.3 96.3  PLT 154 151 151 133* 169 165   Cardiac Enzymes: No results for input(s): CKTOTAL, CKMB, CKMBINDEX, TROPONINI in the last 168 hours. BNP: Invalid input(s): POCBNP CBG: No results for input(s): GLUCAP in the last 168 hours. D-Dimer No results for input(s): DDIMER in the last 72 hours. Hgb A1c No results for input(s): HGBA1C in the last 72 hours. Lipid Profile No results for input(s): CHOL, HDL, LDLCALC, TRIG, CHOLHDL, LDLDIRECT in the last 72 hours. Thyroid function studies No results for input(s): TSH, T4TOTAL, T3FREE, THYROIDAB in the last 72 hours.  Invalid input(s): FREET3 Anemia work up No results for input(s): VITAMINB12, FOLATE, FERRITIN, TIBC, IRON, RETICCTPCT in the last 72 hours. Urinalysis    Component Value Date/Time   COLORURINE YELLOW 02/16/2018 2329    APPEARANCEUR HAZY (A) 02/16/2018 2329   LABSPEC >=1.030 07/29/2018 2007   PHURINE 5.5 07/29/2018 2007   GLUCOSEU NEGATIVE 07/29/2018 2007   HGBUR LARGE (A) 07/29/2018 2007   BILIRUBINUR NEGATIVE 07/29/2018 2007   KETONESUR 15 (A) 07/29/2018 2007   PROTEINUR 30 (A) 07/29/2018 2007   UROBILINOGEN 0.2 07/29/2018 2007   NITRITE NEGATIVE 07/29/2018 2007   LEUKOCYTESUR NEGATIVE 07/29/2018 2007   Sepsis Labs Invalid input(s): PROCALCITONIN,  WBC,  LACTICIDVEN Microbiology Recent Results (from the past 240 hour(s))  Wound or Superficial Culture     Status: None   Collection Time: 08/27/19  3:41 PM   Specimen: Abscess; Wound  Result Value Ref Range Status   Specimen Description ABSCESS  Final   Special Requests Normal  Final   Gram Stain   Final    FEW WBC PRESENT, PREDOMINANTLY PMN FEW GRAM POSITIVE COCCI IN PAIRS IN CLUSTERS Performed at [facility], [address] [place], [place] [address]    Culture FEW METHICILLIN RESISTANT STAPHYLOCOCCUS AUREUS  Final   Report Status 08/29/2019 FINAL  Final   Organism ID, Bacteria METHICILLIN RESISTANT STAPHYLOCOCCUS AUREUS  Final      Susceptibility   Methicillin resistant staphylococcus aureus - MIC*    CIPROFLOXACIN >=8 RESISTANT Resistant     ERYTHROMYCIN >=8 RESISTANT Resistant     GENTAMICIN <=0.5 SENSITIVE Sensitive     OXACILLIN >=4 RESISTANT Resistant     TETRACYCLINE <=1 SENSITIVE Sensitive     VANCOMYCIN <=0.5 SENSITIVE Sensitive     TRIMETH/SULFA <=10 SENSITIVE Sensitive     CLINDAMYCIN <=0.25 SENSITIVE Sensitive     RIFAMPIN <=0.5 SENSITIVE Sensitive     Inducible Clindamycin NEGATIVE Sensitive     * FEW METHICILLIN RESISTANT STAPHYLOCOCCUS AUREUS  Culture, blood (Routine x 2)     Status: None (Preliminary result)   Collection Time: 08/28/19  7:16 AM   Specimen: BLOOD  Result Value Ref Range Status   Specimen Description BLOOD RIGHT ANTECUBITAL  Final   Special Requests   Final    BOTTLES DRAWN AEROBIC AND ANAEROBIC  Blood Culture adequate volume   Culture   Final    NO GROWTH 4 DAYS Performed at [facility], [address] [place], [place] [address]    Report Status PENDING  Incomplete  Culture, blood (Routine x 2)     Status: None (Preliminary result)   Collection Time: 08/28/19  7:16 AM   Specimen: BLOOD  Result Value Ref Range Status   Specimen Description BLOOD LEFT ANTECUBITAL  Final   Special Requests   Final    BOTTLES DRAWN AEROBIC AND ANAEROBIC Blood Culture adequate volume   Culture   Final    NO GROWTH 4 DAYS Performed at [facility], [place] [address] [place], [place] [address]    Report Status PENDING  Incomplete  SARS CORONAVIRUS 2 (TAT 6-24 HRS) Nasopharyngeal Nasopharyngeal Swab     Status: None   Collection Time: 08/28/19  9:25 AM   Specimen: Nasopharyngeal Swab  Result Value Ref Range Status   SARS Coronavirus 2 NEGATIVE NEGATIVE Final    Comment: (NOTE) SARS-CoV-2 target nucleic acids are NOT DETECTED. The SARS-CoV-2 RNA is generally detectable in upper and lower respiratory specimens during the acute phase of infection. Negative results do not preclude SARS-CoV-2 infection, do not rule out co-infections with other pathogens, and should not be used as the sole basis for treatment or other patient management decisions. Negative results must be combined with clinical observations, patient history, and epidemiological information. The expected result is Negative. Fact Sheet for Patients: SugarRoll.be Fact Sheet for Healthcare Providers: https://www.woods-mathews.com/ This test is not yet approved or cleared by the [place] FDA and  has been authorized for detection and/or diagnosis of SARS-CoV-2 by FDA under an Emergency Use Authorization (EUA). This EUA will remain  in effect (meaning this test can be used) for the duration of the COVID-19 declaration under Section 56 4(b)(1) of the Act, 21 U.S.C. section  360bbb-3(b)(1), unless the authorization is terminated or revoked sooner. Performed at [facility], [place] [address], [place], [place] [address]   MRSA PCR Screening     Status: None   Collection Time: 08/29/19 10:53 PM   Specimen: Nasal Mucosa; Nasopharyngeal  Result Value Ref Range Status   MRSA by PCR NEGATIVE NEGATIVE Final    Comment:        The GeneXpert MRSA Assay (FDA approved for NASAL specimens only), is one component of a comprehensive MRSA colonization surveillance program. It is not intended to diagnose MRSA infection nor to guide or monitor treatment for MRSA infections. Performed at [facility], [place] [address], [place], [place] [address]    Time spent: 30 min  SIGNED:   Marylu Lund, MD  [facility] 09/01/2019, 2:30 PM  If 7PM-7AM, please contact night-coverage

## 2019-09-02 LAB — CULTURE, BLOOD (ROUTINE X 2)
Culture: NO GROWTH
Culture: NO GROWTH
Special Requests: ADEQUATE
Special Requests: ADEQUATE

## 2019-09-10 DIAGNOSIS — Z22322 Carrier or suspected carrier of Methicillin resistant Staphylococcus aureus: Secondary | ICD-10-CM

## 2019-09-10 HISTORY — DX: Carrier or suspected carrier of methicillin resistant Staphylococcus aureus: Z22.322

## 2019-09-19 ENCOUNTER — Encounter: Payer: Self-pay | Admitting: Orthopaedic Surgery

## 2019-09-19 ENCOUNTER — Other Ambulatory Visit: Payer: Self-pay

## 2019-09-19 ENCOUNTER — Ambulatory Visit: Payer: Medicare Other | Admitting: Orthopedic Surgery

## 2019-09-19 ENCOUNTER — Ambulatory Visit (INDEPENDENT_AMBULATORY_CARE_PROVIDER_SITE_OTHER): Payer: Medicare Other | Admitting: Orthopaedic Surgery

## 2019-09-19 ENCOUNTER — Telehealth: Payer: Self-pay | Admitting: *Deleted

## 2019-09-19 VITALS — BP 152/75 | HR 59 | Ht 66.0 in | Wt 162.0 lb

## 2019-09-19 DIAGNOSIS — M1711 Unilateral primary osteoarthritis, right knee: Secondary | ICD-10-CM | POA: Diagnosis not present

## 2019-09-19 NOTE — H&P (Addendum)
TOTAL KNEE ADMISSION H&P  Patient is being admitted for left total knee arthroplasty.  Subjective:  Chief Complaint:left knee pain.  HPI: Larry [name], 68 y.o. male, has a history of pain and functional disability in the left knee due to arthritis and has failed non-surgical conservative treatments for greater than 12 weeks to includeNSAID's and/or analgesics, corticosteriod injections, flexibility and strengthening excercises, weight reduction as appropriate and activity modification.  Onset of symptoms was gradual, starting 4 years ago with gradually worsening course since that time. The patient noted prior procedures on the knee to include  arthroscopy on the left knee(s).  Patient currently rates pain in the left knee(s) at 7 out of 10 with activity. Patient has night pain, worsening of pain with activity and weight bearing, pain that interferes with activities of daily living, pain with passive range of motion, crepitus and joint swelling.  Patient has evidence of subchondral cysts, subchondral sclerosis, periarticular osteophytes, joint subluxation and joint space narrowing by imaging studies.. There is no active infection.  Patient Active Problem List   Diagnosis Date Noted  . Cellulitis of arm 08/28/2019  . Essential hypertension 02/16/2018  . Sepsis due to skin infection ([place]) 02/16/2018  . Chronic pain 02/16/2018  . Cellulitis of left lower extremity   . Lumbar radiculopathy, chronic 01/24/2018  . Lumbar stenosis with neurogenic claudication 03/23/2017  . Hypothyroidism 07/25/2015  . Primary osteoarthritis of right knee 07/23/2015  . Primary osteoarthritis of knee 07/23/2015   Past Medical History:  Diagnosis Date  . Arthritis    "knees; left shoulder" (04/12/2013)  . Asthma    "as a child" (04/12/2013)  . DDD (degenerative disc disease), cervical   . DDD (degenerative disc disease), lumbar   . Difficult intubation    2012   CERVICAL FUSION   . Headache(784.0)    VERAPAMIL FOR PREVENTION   . 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 History of chicken pox    as an adult  . Hypoglycemia    last episode 1 yr. ago, just gets jittery  . Hypothyroidism   . Lumbar stenosis   . OSA (obstructive sleep apnea)    "haven't wore mask in years; had OR; still snore" (04/12/2013)  . Urinary frequency     Past Surgical History:  Procedure Laterality Date  . BACK SURGERY    . CERVICAL FUSION  2002; 2012  . CYSTECTOMY    . INGUINAL HERNIA REPAIR Bilateral 1997  . KNEE ARTHROSCOPY Left ~ 1997; ~ 2005  . LUMBAR FUSION  2007; 04/11/2013  . PENILE DEBRIDEMENT  ~ 2011   X 3  FOR MRSA INFECTION    . SHOULDER ARTHROSCOPY Left ~ 2003  . TONSILLECTOMY  1990's  . TOTAL KNEE ARTHROPLASTY Right 07/23/2015  . TOTAL KNEE ARTHROPLASTY Right 07/23/2015   Procedure: TOTAL KNEE ARTHROPLASTY;  Surgeon: Garald Balding, MD;  Location: [facility];  Service: Orthopedics;  Laterality: Right;  . UVULOPALATOPHARYNGOPLASTY (UPPP)/TONSILLECTOMY/SEPTOPLASTY  1990's  . VASECTOMY      No current facility-administered medications for this encounter.   Current Outpatient Medications  Medication Sig Dispense Refill Last Dose  . aspirin EC 81 MG tablet Take 81 mg by mouth daily.     . furosemide (LASIX) 40 MG tablet Take 40 mg by mouth daily as needed for edema.      Marland Kitchen HYDROcodone-acetaminophen (NORCO) 5-325 MG tablet Take 1 tablet  by mouth every 4 (four) hours as needed for moderate pain. 15 tablet 0   . latanoprost (XALATAN) 0.005 % ophthalmic solution Place 1 drop into both eyes at bedtime.  2   . levothyroxine (SYNTHROID) 200 MCG tablet Take 200 mcg by mouth daily before breakfast.      . LORazepam (ATIVAN) 0.5 MG tablet Take 1 mg by mouth at bedtime.   1   . losartan (COZAAR) 50 MG tablet Take 50 mg by mouth daily.      . meloxicam (MOBIC) 15 MG tablet Take 15 mg by mouth daily.      . Multiple  Vitamins-Minerals (MULTIVITAMIN PO) Take 1 tablet by mouth daily.     . Oxymetazoline HCl (AFRIN NASAL SPRAY NA) Place 2 sprays into both nostrils 2 (two) times daily as needed (nasal congestion).     . Phosphatidylserine-DHA-EPA (VAYACOG) 100-19.5-6.5 MG CAPS Take 1 capsule by mouth daily.     . Sildenafil Citrate (VIAGRA PO) Take 30 mg by mouth daily as needed (for erectile dysfunction). Purchased from [employer]--dose indicated on website as 30 mg     . tamsulosin (FLOMAX) 0.4 MG CAPS capsule Take 0.4 mg by mouth daily.     Marland Kitchen testosterone cypionate (DEPOTESTOSTERONE CYPIONATE) 200 MG/ML injection Inject 100 mg into the muscle every 14 (fourteen) days.  2   . verapamil (CALAN-SR) 180 MG CR tablet Take 180 mg by mouth daily.  15    No Known Allergies  Social History   Tobacco Use  . Smoking status: Former Smoker    Packs/day: 0.50    Years: 15.00    Pack years: 7.50    Types: Cigarettes    Quit date: 10/14/1986    Years since quitting: 32.9  . Smokeless tobacco: Never Used  Substance Use Topics  . Alcohol use: Yes    Comment: occasional    No family history on file.   Review of Systems  Constitutional: Negative for fatigue.  HENT: Negative for ear pain.   Eyes: Negative for pain.  Respiratory: Negative for shortness of breath.   Cardiovascular: Negative for leg swelling.  Gastrointestinal: Positive for diarrhea. Negative for constipation.  Endocrine: Negative for cold intolerance and heat intolerance.  Genitourinary: Negative for difficulty urinating.  Musculoskeletal: Negative for joint swelling.  Skin: Negative for rash.  Allergic/Immunologic: Negative for food allergies.  Neurological: Positive for weakness.  Hematological: Does not bruise/bleed easily.  Psychiatric/Behavioral: Negative for sleep disturbance.   Objective:  Physical Exam  Constitutional: He is oriented to person, place, and time. He appears well-developed and well-nourished.  HENT:  Head:  Normocephalic and atraumatic.  Eyes: Pupils are equal, round, and reactive to light. Conjunctivae and EOM are normal.  Neck: No thyromegaly present.  Cardiovascular: Normal rate, regular rhythm, normal heart sounds and intact distal pulses.  No murmur heard. Respiratory: Effort normal and breath sounds normal. He has no wheezes.  GI: Soft. Bowel sounds are normal. He exhibits no distension. There is no abdominal tenderness.  Musculoskeletal:     Cervical back: Neck supple.  Neurological: He is alert and oriented to person, place, and time.  Skin: Skin is warm and dry.  Psychiatric: He has a normal mood and affect. His behavior is normal. Judgment and thought content normal.  Musculoskeletal: Range of motion today is from near full extension to about 100 degrees of flexion.  Does have crepitance with range of motion.  Mild effusion without warmth or erythema.    Vital signs in last 24  hours: Pulse Rate:  [59] 59 (12/22 1301) BP: (152)/(75) 152/75 (12/22 1301) Weight:  [73.5 kg] 73.5 kg (12/22 1301)  Labs:   Estimated body mass index is 26.15 kg/m as calculated from the following:   Height as of 09/19/19: 5\' 6"  (1.676 m).   Weight as of 09/19/19: 73.5 kg.   Imaging Review Plain radiographs demonstrate moderate degenerative joint disease of the left knee(s). The overall alignment issignificant varus. The bone quality appears to be good for age and reported activity level.      Assessment/Plan:  End stage arthritis, left knee   The patient history, physical examination, clinical judgment of the provider and imaging studies are consistent with end stage degenerative joint disease of the left knee(s) and total knee arthroplasty is deemed medically necessary. The treatment options including medical management, injection therapy arthroscopy and arthroplasty were discussed at length. The risks and benefits of total knee arthroplasty were presented and reviewed. The risks due to aseptic  loosening, infection, stiffness, patella tracking problems, thromboembolic complications and other imponderables were discussed. The patient acknowledged the explanation, agreed to proceed with the plan and consent was signed. Patient is being admitted for inpatient treatment for surgery, pain control, PT, OT, prophylactic antibiotics, VTE prophylaxis, progressive ambulation and ADL's and discharge planning. The patient is planning to be discharged home with home health services     Patient's anticipated LOS is less than 2 midnights, meeting these requirements: - Younger than 10 - Lives within 1 hour of care - Has a competent adult at home to recover with post-op recover - NO history of  - Chronic pain requiring opiods  - Diabetes  - Coronary Artery Disease  - Heart failure  - Heart attack  - Stroke  - DVT/VTE  - Cardiac arrhythmia  - Respiratory Failure/COPD  - Renal failure  - Anemia  - Advanced Liver disease

## 2019-09-19 NOTE — Progress Notes (Signed)
Office Visit Note   Patient: Larry [name]           Date of Birth: 12-23-1950           MRN: [id number] Visit Date: 09/19/2019              Chief Complaint:left knee pain.  HPI: Larry [name], 68 y.o. male, has a history of pain and functional disability in the left knee due to arthritis and has failed non-surgical conservative treatments for greater than 12 weeks to includeNSAID's and/or analgesics, corticosteriod injections, flexibility and strengthening excercises, weight reduction as appropriate and activity modification.  Onset of symptoms was gradual, starting 4 years ago with gradually worsening course since that time. The patient noted prior procedures on the knee to include  arthroscopy on the left knee(s).  Patient currently rates pain in the left knee(s) at 7 out of 10 with activity. Patient has night pain, worsening of pain with activity and weight bearing, pain that interferes with activities of daily living, pain with passive range of motion, crepitus and joint swelling.  Patient has evidence of subchondral cysts, subchondral sclerosis, periarticular osteophytes, joint subluxation and joint space narrowing by imaging studies.. There is no active infection.  Patient Active Problem List   Diagnosis Date Noted  . Cellulitis of arm 08/28/2019  . Essential hypertension 02/16/2018  . Sepsis due to skin infection ([place]) 02/16/2018  . Chronic pain 02/16/2018  . Cellulitis of left lower extremity   . Lumbar radiculopathy, chronic 01/24/2018  . Lumbar stenosis with neurogenic claudication 03/23/2017  . Hypothyroidism 07/25/2015  . Primary osteoarthritis of right knee 07/23/2015  . Primary osteoarthritis of knee 07/23/2015   Past Medical History:  Diagnosis Date  . Arthritis    "knees; left shoulder" (04/12/2013)  . Asthma    "as a child" (04/12/2013)  . DDD (degenerative disc disease), cervical   . DDD (degenerative disc disease), lumbar   . Difficult intubation    2012    CERVICAL FUSION   . Headache(784.0)    VERAPAMIL FOR PREVENTION   . 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 History of chicken pox    as an adult  . Hypoglycemia    last episode 1 yr. ago, just gets jittery  . Hypothyroidism   . Lumbar stenosis   . OSA (obstructive sleep apnea)    "haven't wore mask in years; had OR; still snore" (04/12/2013)  . Urinary frequency     Past Surgical History:  Procedure Laterality Date  . BACK SURGERY    . CERVICAL FUSION  2002; 2012  . CYSTECTOMY    . INGUINAL HERNIA REPAIR Bilateral 1997  . KNEE ARTHROSCOPY Left ~ 1997; ~ 2005  . LUMBAR FUSION  2007; 04/11/2013  . PENILE DEBRIDEMENT  ~ 2011   X 3  FOR MRSA INFECTION    . SHOULDER ARTHROSCOPY Left ~ 2003  . TONSILLECTOMY  1990's  . TOTAL KNEE ARTHROPLASTY Right 07/23/2015  . TOTAL KNEE ARTHROPLASTY Right 07/23/2015   Procedure: TOTAL KNEE ARTHROPLASTY;  Surgeon: Garald Balding, MD;  Location: [facility];  Service: Orthopedics;  Laterality: Right;  . UVULOPALATOPHARYNGOPLASTY (UPPP)/TONSILLECTOMY/SEPTOPLASTY  1990's  . VASECTOMY      No current facility-administered medications for this encounter.   Current Outpatient Medications  Medication Sig Dispense Refill Last Dose  . aspirin EC 81 MG tablet Take 81 mg by  mouth daily.     . furosemide (LASIX) 40 MG tablet Take 40 mg by mouth daily as needed for edema.      Marland Kitchen HYDROcodone-acetaminophen (NORCO) 5-325 MG tablet Take 1 tablet by mouth every 4 (four) hours as needed for moderate pain. 15 tablet 0   . latanoprost (XALATAN) 0.005 % ophthalmic solution Place 1 drop into both eyes at bedtime.  2   . levothyroxine (SYNTHROID) 200 MCG tablet Take 200 mcg by mouth daily before breakfast.      . LORazepam (ATIVAN) 0.5 MG tablet Take 1 mg by mouth at bedtime.   1   . losartan (COZAAR) 50 MG tablet Take 50 mg by mouth daily.      . meloxicam (MOBIC) 15 MG tablet Take  15 mg by mouth daily.      . Multiple Vitamins-Minerals (MULTIVITAMIN PO) Take 1 tablet by mouth daily.     . Oxymetazoline HCl (AFRIN NASAL SPRAY NA) Place 2 sprays into both nostrils 2 (two) times daily as needed (nasal congestion).     . Phosphatidylserine-DHA-EPA (VAYACOG) 100-19.5-6.5 MG CAPS Take 1 capsule by mouth daily.     . Sildenafil Citrate (VIAGRA PO) Take 30 mg by mouth daily as needed (for erectile dysfunction). Purchased from [email or web address]--dose indicated on website as 30 mg     . tamsulosin (FLOMAX) 0.4 MG CAPS capsule Take 0.4 mg by mouth daily.     Marland Kitchen testosterone cypionate (DEPOTESTOSTERONE CYPIONATE) 200 MG/ML injection Inject 100 mg into the muscle every 14 (fourteen) days.  2   . verapamil (CALAN-SR) 180 MG CR tablet Take 180 mg by mouth daily.  15    No Known Allergies  Social History   Tobacco Use  . Smoking status: Former Smoker    Packs/day: 0.50    Years: 15.00    Pack years: 7.50    Types: Cigarettes    Quit date: 10/14/1986    Years since quitting: 32.9  . Smokeless tobacco: Never Used  Substance Use Topics  . Alcohol use: Yes    Comment: occasional    No family history on file.   Review of Systems  Constitutional: Negative for fatigue.  HENT: Negative for ear pain.   Eyes: Negative for pain.  Respiratory: Negative for shortness of breath.   Cardiovascular: Negative for leg swelling.  Gastrointestinal: Positive for diarrhea. Negative for constipation.  Endocrine: Negative for cold intolerance and heat intolerance.  Genitourinary: Negative for difficulty urinating.  Musculoskeletal: Negative for joint swelling.  Skin: Negative for rash.  Allergic/Immunologic: Negative for food allergies.  Neurological: Positive for weakness.  Hematological: Does not bruise/bleed easily.  Psychiatric/Behavioral: Negative for sleep disturbance.   Objective:  Physical Exam  Constitutional: He is oriented to person, place, and time. He appears well-developed and  well-nourished.  HENT:  Head: Normocephalic and atraumatic.  Eyes: Pupils are equal, round, and reactive to light. Conjunctivae and EOM are normal.  Neck: No thyromegaly present.  Cardiovascular: Normal rate, regular rhythm, normal heart sounds and intact distal pulses.  No murmur heard. Respiratory: Effort normal and breath sounds normal. He has no wheezes.  GI: Soft. Bowel sounds are normal. He exhibits no distension. There is no abdominal tenderness.  Musculoskeletal:     Cervical back: Neck supple.  Neurological: He is alert and oriented to person, place, and time.  Skin: Skin is warm and dry.  Psychiatric: He has a normal mood and affect. His behavior is normal. Judgment and thought content normal.  Musculoskeletal: Range of  motion today is from near full extension to about 100 degrees of flexion.  Does have crepitance with range of motion.  Mild effusion without warmth or erythema.    Vital signs in last 24 hours: Pulse Rate:  [59] 59 (12/22 1301) BP: (152)/(75) 152/75 (12/22 1301) Weight:  [73.5 kg] 73.5 kg (12/22 1301)  Labs:   Estimated body mass index is 26.15 kg/m as calculated from the following:   Height as of 09/19/19: 5\' 6"  (1.676 m).   Weight as of 09/19/19: 73.5 kg.   Imaging Review Plain radiographs demonstrate moderate degenerative joint disease of the left knee(s). The overall alignment issignificant varus. The bone quality appears to be good for age and reported activity level.      Assessment/Plan:  End stage arthritis, left knee   The patient history, physical examination, clinical judgment of the provider and imaging studies are consistent with end stage degenerative joint disease of the left knee(s) and total knee arthroplasty is deemed medically necessary. The treatment options including medical management, injection therapy arthroscopy and arthroplasty were discussed at length. The risks and benefits of total knee arthroplasty were presented and  reviewed. The risks due to aseptic loosening, infection, stiffness, patella tracking problems, thromboembolic complications and other imponderables were discussed. The patient acknowledged the explanation, agreed to proceed with the plan and consent was signed. Patient is being admitted for inpatient treatment for surgery, pain control, PT, OT, prophylactic antibiotics, VTE prophylaxis, progressive ambulation and ADL's and discharge planning. The patient is planning to be discharged home with home health services     Patient's anticipated LOS is less than 2 midnights, meeting these requirements: - Younger than 48 - Lives within 1 hour of care - Has a competent adult at home to recover with post-op recover - NO history of  - Chronic pain requiring opiods  - Diabetes  - Coronary Artery Disease  - Heart failure  - Heart attack  - Stroke  - DVT/VTE  - Cardiac arrhythmia  - Respiratory Failure/COPD  - Renal failure  - Anemia  - Advanced Liver disease  Face-to-face time spent with patient was greater than 40 minutes.  Greater than 50% of the time was spent in counseling and coordination of care.

## 2019-09-19 NOTE — Care Plan (Signed)
RNCM met with patient in office during his H&P appointment with Biagio Borg, PA-C for Dr. Durward Fortes today. Reviewed that patient is an Ortho bundle through [facility]. Reviewed all pre- and post-op instructions related to his upcoming Left TKA on 10/03/19 with Dr. Durward Fortes. Patient reports he lives alone, but his daughter will be able to help for several days after discharge. Anticipate home health PT will be needed briefly for therapy after short hospital stay. Choice provided. Referral to [facility]. Patient has a [place] he states, but would like a 3in1. Will order through [place]. CPM already ordered through [place] per surgery scheduler. F/U scheduled for 2 weeks post-op on 10/16/18 at 1:00 pm. Discussed OPPT to be scheduled to begin at 2 weeks post-op. Business card and TOM/THN information sheet provided. Will continue to assess for CM needs.

## 2019-09-19 NOTE — Telephone Encounter (Signed)
Ortho bundle Pre-op call completed. 

## 2019-09-25 NOTE — Patient Instructions (Signed)
DUE TO COVID-19 ONLY ONE VISITOR IS ALLOWED TO COME WITH YOU AND STAY IN THE WAITING ROOM ONLY DURING PRE OP AND PROCEDURE DAY OF SURGERY. THE 1 VISITOR MAY VISIT WITH YOU AFTER SURGERY IN YOUR PRIVATE ROOM DURING VISITING HOURS ONLY!  YOU NEED TO HAVE A COVID 19 TEST ON_1/2/21______ @_11 :15______, THIS TEST MUST BE DONE BEFORE SURGERY, COME  [place], [place] , [id number].  ([place])  ONCE YOUR COVID TEST IS COMPLETED, PLEASE BEGIN THE QUARANTINE INSTRUCTIONS AS OUTLINED IN YOUR HANDOUT.                [place]   Your procedure is scheduled on: 10/03/19   Report to [facility] Main  Entrance   Report to Short Stay at 5:30 AM     Call this number if you have problems the morning of surgery [place], NO CHEWING GUM [place].   Do not eat food After Midnight.   YOU MAY HAVE CLEAR LIQUIDS FROM MIDNIGHT UNTIL 4:30 AM  . At 4:30 AM Please finish the prescribed Pre-Surgery  drink.   Nothing by mouth after you finish the  drink !   Take these medicines the morning of surgery with A SIP OF WATER: Flomax, Levothyroxine, eye drops                                 You may not have any metal on your body including               piercings  Do not wear jewelry,lotions, powders ordeodorant                     Men may shave face and neck.   Do not bring valuables to the hospital. [place].  Contacts, dentures or bridgework may not be worn into surgery.      Patients discharged the day of surgery will not be allowed to drive home.  IF YOU ARE HAVING SURGERY AND GOING HOME THE SAME DAY, YOU MUST HAVE AN ADULT TO DRIVE YOU HOME AND BE WITH YOU FOR 24 HOURS.  YOU MAY GO HOME BY TAXI OR UBER OR ORTHERWISE, BUT AN ADULT MUST ACCOMPANY YOU HOME AND STAY WITH YOU FOR 24 HOURS.  Name and phone number of your driver:  Special  Instructions: N/A              Please read over the following fact sheets you were given: _____________________________________________________________________             [facility] - Preparing for Surgery  Before surgery, you can play an important role.   Because skin is not sterile, your skin needs to be as free of germs as possible.   You can reduce the number of germs on your skin by washing with CHG (chlorahexidine gluconate) soap before surgery.   CHG is an antiseptic cleaner which kills germs and bonds with the skin to continue killing germs even after washing. Please DO NOT use if you have an allergy to CHG or antibacterial soaps.   If your skin becomes reddened/irritated stop using the CHG and inform your nurse when you arrive at Short Stay.  You may  shave your face/neck . Please follow these instructions carefully:  1.  Shower with CHG Soap the night before surgery and the  morning of Surgery.  2.  If you choose to wash your hair, wash your hair first as usual with your  normal  shampoo.  3.  After you shampoo, rinse your hair and body thoroughly to remove the  shampoo.                                        4.  Use CHG as you would any other liquid soap.  You can apply chg directly  to the skin and wash                       Gently with a scrungie or clean washcloth.  5.  Apply the CHG Soap to your body ONLY FROM THE NECK DOWN.   Do not use on face/ open                           Wound or open sores. Avoid contact with eyes, ears mouth and genitals (private parts).                       Wash face,  Genitals (private parts) with your normal soap.             6.  Wash thoroughly, paying special attention to the area where your surgery  will be performed.  7.  Thoroughly rinse your body with warm water from the neck down.  8.  DO NOT shower/wash with your normal soap after using and rinsing off  the CHG Soap.             9.  Pat yourself dry with a clean towel.            10.   Wear clean pajamas.            11.  Place clean sheets on your bed the night of your first shower and do not  sleep with pets. Day of Surgery : Do not apply any lotions/deodorants the morning of surgery.  Please wear clean clothes to the hospital/surgery center.  FAILURE TO FOLLOW THESE INSTRUCTIONS MAY RESULT IN THE CANCELLATION OF YOUR SURGERY PATIENT SIGNATURE_________________________________  NURSE SIGNATURE__________________________________  ________________________________________________________________________   Adam [place]ix  An incentive spirometer is a tool that can help keep your lungs clear and active. This tool measures how well you are filling your lungs with each breath. Taking long deep breaths may help reverse or decrease the chance of developing breathing (pulmonary) problems (especially infection) following:  A long period of time when you are unable to move or be active. BEFORE THE PROCEDURE   If the spirometer includes an indicator to show your best effort, your nurse or respiratory therapist will set it to a desired goal.  If possible, sit up straight or lean slightly forward. Try not to slouch.  Hold the incentive spirometer in an upright position. INSTRUCTIONS FOR USE  1. Sit on the edge of your bed if possible, or sit up as far as you can in bed or on a chair. 2. Hold the incentive spirometer in an upright position. 3. Breathe out normally. 4. Place the mouthpiece in your mouth and seal your lips tightly around it. 5.  Breathe in slowly and as deeply as possible, raising the piston or the ball toward the top of the column. 6. Hold your breath for 3-5 seconds or for as long as possible. Allow the piston or ball to fall to the bottom of the column. 7. Remove the mouthpiece from your mouth and breathe out normally. 8. Rest for a few seconds and repeat Steps 1 through 7 at least 10 times every 1-2 hours when you are awake. Take your time and take a few  normal breaths between deep breaths. 9. The spirometer may include an indicator to show your best effort. Use the indicator as a goal to work toward during each repetition. 10. After each set of 10 deep breaths, practice coughing to be sure your lungs are clear. If you have an incision (the cut made at the time of surgery), support your incision when coughing by placing a pillow or rolled up towels firmly against it. Once you are able to get out of bed, walk around indoors and cough well. You may stop using the incentive spirometer when instructed by your caregiver.  RISKS AND COMPLICATIONS  Take your time so you do not get dizzy or light-headed.  If you are in pain, you may need to take or ask for pain medication before doing incentive spirometry. It is harder to take a deep breath if you are having pain. AFTER USE  Rest and breathe slowly and easily.  It can be helpful to keep track of a log of your progress. Your caregiver can provide you with a simple table to help with this. If you are using the spirometer at home, follow these instructions: [place] IF:   You are having difficultly using the spirometer.  You have trouble using the spirometer as often as instructed.  Your pain medication is not giving enough relief while using the spirometer.  You develop fever of 100.5 F (38.1 C) or higher. SEEK IMMEDIATE MEDICAL CARE IF:   You cough up bloody sputum that had not been present before.  You develop fever of 102 F (38.9 C) or greater.  You develop worsening pain at or near the incision site. MAKE SURE YOU:   Understand these instructions.  Will watch your condition.  Will get help right away if you are not doing well or get worse. Document Released: 01/25/2007 Document Revised: 12/07/2011 Document Reviewed: 03/28/2007 ExitCare Patient Information 2014 ExitCare, [place].   ________________________________________________________________________  WHAT IS A  BLOOD TRANSFUSION? Blood Transfusion Information  A transfusion is the replacement of blood or some of its parts. Blood is made up of multiple cells which provide different functions.  Red blood cells carry oxygen and are used for blood loss replacement.  White blood cells fight against infection.  Platelets control bleeding.  Plasma helps clot blood.  Other blood products are available for specialized needs, such as hemophilia or other clotting disorders. BEFORE THE TRANSFUSION  Who gives blood for transfusions?   Healthy volunteers who are fully evaluated to make sure their blood is safe. This is blood bank blood. Transfusion therapy is the safest it has ever been in the practice of medicine. Before blood is taken from a donor, a complete history is taken to make sure that person has no history of diseases nor engages in risky social behavior (examples are intravenous drug use or sexual activity with multiple partners). The donor's travel history is screened to minimize risk of transmitting infections, such as malaria. The donated blood is  tested for signs of infectious diseases, such as HIV and hepatitis. The blood is then tested to be sure it is compatible with you in order to minimize the chance of a transfusion reaction. If you or a relative donates blood, this is often done in anticipation of surgery and is not appropriate for emergency situations. It takes many days to process the donated blood. RISKS AND COMPLICATIONS Although transfusion therapy is very safe and saves many lives, the main dangers of transfusion include:   Getting an infectious disease.  Developing a transfusion reaction. This is an allergic reaction to something in the blood you were given. Every precaution is taken to prevent this. The decision to have a blood transfusion has been considered carefully by your caregiver before blood is given. Blood is not given unless the benefits outweigh the risks. AFTER THE  TRANSFUSION  Right after receiving a blood transfusion, you will usually feel much better and more energetic. This is especially true if your red blood cells have gotten low (anemic). The transfusion raises the level of the red blood cells which carry oxygen, and this usually causes an energy increase.  The nurse administering the transfusion will monitor you carefully for complications. HOME CARE INSTRUCTIONS  No special instructions are needed after a transfusion. You may find your energy is better. Speak with your caregiver about any limitations on activity for underlying diseases you may have. SEEK MEDICAL CARE IF:   Your condition is not improving after your transfusion.  You develop redness or irritation at the intravenous (IV) site. SEEK IMMEDIATE MEDICAL CARE IF:  Any of the following symptoms occur over the next 12 hours:  Shaking chills.  You have a temperature by mouth above 102 F (38.9 C), not controlled by medicine.  Chest, back, or muscle pain.  People around you feel you are not acting correctly or are confused.  Shortness of breath or difficulty breathing.  Dizziness and fainting.  You get a rash or develop hives.  You have a decrease in urine output.  Your urine turns a dark color or changes to pink, red, or brown. Any of the following symptoms occur over the next 10 days:  You have a temperature by mouth above 102 F (38.9 C), not controlled by medicine.  Shortness of breath.  Weakness after normal activity.  The white part of the eye turns yellow (jaundice).  You have a decrease in the amount of urine or are urinating less often.  Your urine turns a dark color or changes to pink, red, or brown. Document Released: 09/11/2000 Document Revised: 12/07/2011 Document Reviewed: 04/30/2008 [facility] Patient Information 2014 [place], [place].  _______________________________________________________________________

## 2019-09-26 ENCOUNTER — Encounter (HOSPITAL_COMMUNITY)
Admission: RE | Admit: 2019-09-26 | Discharge: 2019-09-26 | Disposition: A | Discharge status: Home / Self Care | Payer: Medicare Other | Source: Ambulatory Visit | Attending: Orthopaedic Surgery | Admitting: Orthopaedic Surgery

## 2019-09-26 ENCOUNTER — Other Ambulatory Visit: Payer: Self-pay

## 2019-09-26 ENCOUNTER — Encounter (HOSPITAL_COMMUNITY): Payer: Self-pay

## 2019-09-26 DIAGNOSIS — Z01812 Encounter for preprocedural laboratory examination: Secondary | ICD-10-CM | POA: Insufficient documentation

## 2019-09-26 DIAGNOSIS — Z01810 Encounter for preprocedural cardiovascular examination: Secondary | ICD-10-CM | POA: Diagnosis present

## 2019-09-26 DIAGNOSIS — Z01818 Encounter for other preprocedural examination: Secondary | ICD-10-CM

## 2019-09-26 LAB — URINALYSIS, ROUTINE W REFLEX MICROSCOPIC
Bilirubin Urine: NEGATIVE
Glucose, UA: NEGATIVE mg/dL
Hgb urine dipstick: NEGATIVE
Ketones, ur: NEGATIVE mg/dL
Leukocytes,Ua: NEGATIVE
Nitrite: NEGATIVE
Protein, ur: NEGATIVE mg/dL
Specific Gravity, Urine: 1.028 (ref 1.005–1.030)
pH: 5 (ref 5.0–8.0)

## 2019-09-26 LAB — COMPREHENSIVE METABOLIC PANEL
ALT: 35 U/L (ref 0–44)
AST: 35 U/L (ref 15–41)
Albumin: 4.3 g/dL (ref 3.5–5.0)
Alkaline Phosphatase: 47 U/L (ref 38–126)
Anion gap: 8 (ref 5–15)
BUN: 20 mg/dL (ref 8–23)
CO2: 27 mmol/L (ref 22–32)
Calcium: 8.9 mg/dL (ref 8.9–10.3)
Chloride: 106 mmol/L (ref 98–111)
Creatinine, Ser: 0.82 mg/dL (ref 0.61–1.24)
GFR calc Af Amer: >60 mL/min (ref >60)
GFR calc non Af Amer: >60 mL/min (ref >60)
Glucose, Bld: 117 mg/dL — ABNORMAL HIGH (ref 70–99)
Potassium: 3.9 mmol/L (ref 3.5–5.1)
Sodium: 141 mmol/L (ref 135–145)
Total Bilirubin: 1 mg/dL (ref 0.3–1.2)
Total Protein: 7 g/dL (ref 6.5–8.1)

## 2019-09-26 LAB — CBC WITH DIFFERENTIAL/PLATELET
Abs Immature Granulocytes: 0.04 10*3/uL (ref 0.00–0.07)
Basophils Absolute: 0 10*3/uL (ref 0.0–0.1)
Basophils Relative: 1 %
Eosinophils Absolute: 0.1 10*3/uL (ref 0.0–0.5)
Eosinophils Relative: 2 %
HCT: 46.2 % (ref 39.0–52.0)
Hemoglobin: 15.9 g/dL (ref 13.0–17.0)
Immature Granulocytes: 1 %
Lymphocytes Relative: 25 %
Lymphs Abs: 1.5 10*3/uL (ref 0.7–4.0)
MCH: 33.4 pg (ref 26.0–34.0)
MCHC: 34.4 g/dL (ref 30.0–36.0)
MCV: 97.1 fL (ref 80.0–100.0)
Monocytes Absolute: 0.5 10*3/uL (ref 0.1–1.0)
Monocytes Relative: 8 %
Neutro Abs: 3.8 10*3/uL (ref 1.7–7.7)
Neutrophils Relative %: 63 %
Platelets: 159 10*3/uL (ref 150–400)
RBC: 4.76 MIL/uL (ref 4.22–5.81)
RDW: 13.4 % (ref 11.5–15.5)
WBC: 6 10*3/uL (ref 4.0–10.5)
nRBC: 0 % (ref 0.0–0.2)

## 2019-09-26 LAB — APTT: aPTT: 28 seconds (ref 24–36)

## 2019-09-26 LAB — PROTIME-INR
INR: 1.1 (ref 0.8–1.2)
Prothrombin Time: 14.2 seconds (ref 11.4–15.2)

## 2019-09-26 LAB — SURGICAL PCR SCREEN
MRSA, PCR: NEGATIVE
Staphylococcus aureus: NEGATIVE

## 2019-09-26 NOTE — Progress Notes (Signed)
PCP - Dr. Roselle Locus Cardiologist - none  Chest x-ray - 08/28/19 EKG - 08/31/19 Stress Test - no ECHO - no Cardiac Cath - no  Sleep Study - Yes CPAP - no. Pt had surgery to stop it  Fasting Blood Sugar - NA Checks Blood Sugar _____ times a day  Blood Thinner Instructions:ASA Aspirin Instructions:Dr. Durward Fortes said to stop it 5 days prior to DOS Last Dose:12/21  Anesthesia review:   Patient denies shortness of breath, fever, cough and chest pain at PAT appointment  yes Patient verbalized understanding of instructions that were given to them at the PAT appointment. Patient was also instructed that they will need to review over the PAT instructions again at home before surgery. Yes Pt had an I and D of a MRSA infection on his upper arm. He states that it is healed up

## 2019-09-27 LAB — ABO/RH: ABO/RH(D): A POS

## 2019-09-27 LAB — URINE CULTURE: Culture: <10000 — AB

## 2019-09-28 ENCOUNTER — Other Ambulatory Visit: Payer: Self-pay | Admitting: *Deleted

## 2019-09-28 ENCOUNTER — Telehealth: Payer: Self-pay | Admitting: *Deleted

## 2019-09-28 DIAGNOSIS — Z96652 Presence of left artificial knee joint: Secondary | ICD-10-CM

## 2019-09-28 DIAGNOSIS — M1712 Unilateral primary osteoarthritis, left knee: Secondary | ICD-10-CM

## 2019-09-28 NOTE — Telephone Encounter (Signed)
2nd Ortho bundle pre-op call.

## 2019-09-28 NOTE — Progress Notes (Addendum)
Anesthesia Chart Review   Case: [id number] Date/Time: 10/03/19 0700   Procedure: LEFT TOTAL KNEE ARTHROPLASTY (Left Knee)   Anesthesia type: Choice   Pre-op diagnosis: left knee osteoarthritis   Location: [facility] ROOM 04 / [facility]   Surgeons: Garald Balding, MD      DISCUSSION:68 y.o. former smoker (7.5 pack years, quit 10/14/86) with h/o hypothyroidism, OSA without CPAP, left knee OA scheduled for above procedure 10/03/2019 with Dr. Joni Fears.   Recent admission 11/30-12/12/2018 due to left upper arm cellulitis treated with vanc.  Cellulitis has resolved.   History of difficult intubation.  Per anesthesia notes from 04/11/2013, IV induction, mask ventilation without difficulty.  Grade III view with Miller 3, 7.37m tube used.  2 attempts made.  DL x 1 by CRNA with miller 2. unable to pick up epiglottis.  No attempt at placement of ETT. DL x 1 by MDA with MAC 3. able to visualize VC. Blue stylet used to place ETT. Easy placement of ETT.     No difficulty with subsequent anesthesia, Glidescope used due to limited ROM of neck and previous difficult intubation.   Cleared by PCP, clearance on chart.   Anticipate pt can proceed with planned procedure barring acute status change.   VS: There were no vitals taken for this visit.  PROVIDERS: RMerrilee Seashore MD is PCP    LABS: Labs reviewed: Acceptable for surgery. (all labs ordered are listed, but only abnormal results are displayed)  Labs Reviewed  URINE CULTURE - Abnormal; Notable for the following components:      Result Value   Culture   (*)    Value: <10,000 COLONIES/mL INSIGNIFICANT GROWTH Performed at [facility] [address] [place] [address]   All other components within normal limits  COMPREHENSIVE METABOLIC PANEL - Abnormal; Notable for the following components:   Glucose, Bld 117 (*)    All other components within normal limits  URINALYSIS, ROUTINE W REFLEX MICROSCOPIC - Abnormal; Notable for the  following components:   Color, Urine AMBER (*)    APPearance HAZY (*)    All other components within normal limits  SURGICAL PCR SCREEN  APTT  CBC WITH DIFFERENTIAL/PLATELET  PROTIME-INR  TYPE AND SCREEN  ABO/RH     IMAGES:   EKG: 08/27/2019 Rate 107 Sinus tachycardia with irregular rate Left ventricular hypertrophy Anterior ST elevation, probably due to LVH Since last tracing of earlier today QT has shortened Otherwise no significant change  CV:  Past Medical History:  Diagnosis Date  . Arthritis    "knees; left shoulder" (04/12/2013)  . Asthma    "as a child" (04/12/2013)  . DDD (degenerative disc disease), cervical   . DDD (degenerative disc disease), lumbar   . Difficult intubation    2012   CERVICAL FUSION   . Headache(784.0)    VERAPAMIL FOR PREVENTION   . 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 History of chicken pox    as an adult  . Hypoglycemia    last episode 1 yr. ago, just gets jittery  . Hypothyroidism   . Lumbar stenosis   . MRSA (methicillin resistant staph aureus) culture positive 09/10/2019   upper arm  . OSA (obstructive sleep apnea)    "haven't wore mask in years; had OR; still snore" (04/12/2013)  . Urinary frequency     Past Surgical History:  Procedure Laterality Date  . BACK SURGERY    . CERVICAL FUSION  2002; 2012  . CYSTECTOMY    . INGUINAL HERNIA REPAIR Bilateral 1997  . KNEE ARTHROSCOPY Left ~ 1997; ~ 2005  . LUMBAR FUSION  2007; 04/11/2013  . PENILE DEBRIDEMENT  ~ 2011   X 3  FOR MRSA INFECTION    . SHOULDER ARTHROSCOPY Left ~ 2003  . TONSILLECTOMY  1990's  . TOTAL KNEE ARTHROPLASTY Right 07/23/2015  . TOTAL KNEE ARTHROPLASTY Right 07/23/2015   Procedure: TOTAL KNEE ARTHROPLASTY;  Surgeon: Garald Balding, MD;  Location: [facility];  Service: Orthopedics;  Laterality: Right;  . UVULOPALATOPHARYNGOPLASTY (UPPP)/TONSILLECTOMY/SEPTOPLASTY   1990's  . VASECTOMY      MEDICATIONS: . aspirin EC 81 MG tablet  . furosemide (LASIX) 40 MG tablet  . HYDROcodone-acetaminophen (NORCO) 5-325 MG tablet  . latanoprost (XALATAN) 0.005 % ophthalmic solution  . levothyroxine (SYNTHROID) 200 MCG tablet  . LORazepam (ATIVAN) 0.5 MG tablet  . losartan (COZAAR) 50 MG tablet  . meloxicam (MOBIC) 15 MG tablet  . Multiple Vitamin (MULTIVITAMIN WITH MINERALS) TABS tablet  . Sildenafil Citrate (VIAGRA PO)  . tamsulosin (FLOMAX) 0.4 MG CAPS capsule  . testosterone enanthate (DELATESTRYL) 200 MG/ML injection  . verapamil (CALAN-SR) 180 MG CR tablet   No current facility-administered medications for this encounter.    Maia Plan [facility] Pre-Surgical Testing [phone] 09/28/19 10:39 AM

## 2019-09-28 NOTE — Anesthesia Preprocedure Evaluation (Addendum)
Anesthesia Evaluation  Patient identified by MRN, date of birth, ID band Patient awake    Airway Mallampati: II       Dental   Pulmonary asthma , sleep apnea , former smoker,    breath sounds clear to auscultation       Cardiovascular hypertension,  Rhythm:Regular Rate:Normal     Neuro/Psych    GI/Hepatic   Endo/Other  Hypothyroidism   Renal/GU      Musculoskeletal  (+) Arthritis ,   Abdominal   Peds  Hematology   Anesthesia Other Findings   Reproductive/Obstetrics                          Anesthesia Physical Anesthesia Plan  ASA: III  Anesthesia Plan: General   Post-op Pain Management:    Induction:   PONV Risk Score and Plan: 2 and Midazolam  Airway Management Planned: Oral ETT and Video Laryngoscope Planned  Additional Equipment:   Intra-op Plan:   Post-operative Plan:   Informed Consent:   Plan Discussed with: Anesthesiologist  Anesthesia Plan Comments: (See PAT note 09/26/2019, Konrad Felix, PA-C)       Anesthesia Quick Evaluation

## 2019-09-30 ENCOUNTER — Other Ambulatory Visit (HOSPITAL_COMMUNITY)
Admission: RE | Admit: 2019-09-30 | Discharge: 2019-09-30 | Disposition: A | Discharge status: Home / Self Care | Payer: Medicare Other | Source: Ambulatory Visit | Attending: Orthopaedic Surgery | Admitting: Orthopaedic Surgery

## 2019-09-30 DIAGNOSIS — H9193 Unspecified hearing loss, bilateral: Secondary | ICD-10-CM | POA: Diagnosis not present

## 2019-09-30 DIAGNOSIS — Z01812 Encounter for preprocedural laboratory examination: Secondary | ICD-10-CM | POA: Insufficient documentation

## 2019-09-30 DIAGNOSIS — Z20822 Contact with and (suspected) exposure to covid-19: Secondary | ICD-10-CM | POA: Diagnosis not present

## 2019-09-30 DIAGNOSIS — Z981 Arthrodesis status: Secondary | ICD-10-CM | POA: Diagnosis not present

## 2019-09-30 DIAGNOSIS — Z79891 Long term (current) use of opiate analgesic: Secondary | ICD-10-CM | POA: Diagnosis not present

## 2019-09-30 DIAGNOSIS — E039 Hypothyroidism, unspecified: Secondary | ICD-10-CM | POA: Diagnosis not present

## 2019-09-30 DIAGNOSIS — M1712 Unilateral primary osteoarthritis, left knee: Secondary | ICD-10-CM | POA: Diagnosis not present

## 2019-09-30 DIAGNOSIS — Z96651 Presence of right artificial knee joint: Secondary | ICD-10-CM | POA: Diagnosis not present

## 2019-09-30 DIAGNOSIS — Z87891 Personal history of nicotine dependence: Secondary | ICD-10-CM | POA: Diagnosis not present

## 2019-09-30 DIAGNOSIS — Z7982 Long term (current) use of aspirin: Secondary | ICD-10-CM | POA: Diagnosis not present

## 2019-09-30 DIAGNOSIS — Z79890 Hormone replacement therapy (postmenopausal): Secondary | ICD-10-CM | POA: Diagnosis not present

## 2019-09-30 DIAGNOSIS — G8929 Other chronic pain: Secondary | ICD-10-CM | POA: Diagnosis not present

## 2019-09-30 DIAGNOSIS — Z791 Long term (current) use of non-steroidal anti-inflammatories (NSAID): Secondary | ICD-10-CM | POA: Diagnosis not present

## 2019-09-30 DIAGNOSIS — I110 Hypertensive heart disease with heart failure: Secondary | ICD-10-CM | POA: Diagnosis not present

## 2019-10-01 LAB — SARS CORONAVIRUS 2 (TAT 6-24 HRS): SARS Coronavirus 2: NEGATIVE

## 2019-10-02 ENCOUNTER — Other Ambulatory Visit: Payer: Self-pay

## 2019-10-02 MED ORDER — TRANEXAMIC ACID 1000 MG/10ML IV SOLN
2000.0000 mg | INTRAVENOUS | Status: DC
  Filled 2019-10-02: qty 20

## 2019-10-03 ENCOUNTER — Ambulatory Visit (HOSPITAL_COMMUNITY): Payer: Medicare Other | Admitting: Physician Assistant

## 2019-10-03 ENCOUNTER — Inpatient Hospital Stay (HOSPITAL_COMMUNITY)
Admission: RE | Admit: 2019-10-03 | Discharge: 2019-10-03 | DRG: 470 | Disposition: A | Discharge status: Home / Self Care | Payer: Medicare Other | Source: Ambulatory Visit | Attending: Orthopaedic Surgery | Admitting: Orthopaedic Surgery

## 2019-10-03 ENCOUNTER — Emergency Department (HOSPITAL_COMMUNITY)
Admission: EM | Admit: 2019-10-03 | Discharge: 2019-10-03 | Disposition: A | Discharge status: Home / Self Care | Payer: Medicare Other | Attending: Emergency Medicine | Admitting: Emergency Medicine

## 2019-10-03 ENCOUNTER — Other Ambulatory Visit: Payer: Self-pay | Admitting: Orthopedic Surgery

## 2019-10-03 ENCOUNTER — Telehealth: Payer: Self-pay | Admitting: *Deleted

## 2019-10-03 ENCOUNTER — Encounter (HOSPITAL_COMMUNITY)
Admission: RE | Disposition: A | Discharge status: Home / Self Care | Payer: Self-pay | Source: Ambulatory Visit | Attending: Orthopaedic Surgery

## 2019-10-03 ENCOUNTER — Ambulatory Visit (HOSPITAL_COMMUNITY): Payer: Medicare Other | Admitting: Anesthesiology

## 2019-10-03 ENCOUNTER — Encounter (HOSPITAL_COMMUNITY): Payer: Self-pay | Admitting: Orthopaedic Surgery

## 2019-10-03 ENCOUNTER — Encounter (HOSPITAL_COMMUNITY): Payer: Self-pay | Admitting: Family Medicine

## 2019-10-03 DIAGNOSIS — Z20822 Contact with and (suspected) exposure to covid-19: Secondary | ICD-10-CM | POA: Diagnosis present

## 2019-10-03 DIAGNOSIS — Z96652 Presence of left artificial knee joint: Secondary | ICD-10-CM

## 2019-10-03 DIAGNOSIS — J45909 Unspecified asthma, uncomplicated: Secondary | ICD-10-CM | POA: Diagnosis not present

## 2019-10-03 DIAGNOSIS — M1712 Unilateral primary osteoarthritis, left knee: Secondary | ICD-10-CM

## 2019-10-03 DIAGNOSIS — Z791 Long term (current) use of non-steroidal anti-inflammatories (NSAID): Secondary | ICD-10-CM | POA: Diagnosis not present

## 2019-10-03 DIAGNOSIS — Z7982 Long term (current) use of aspirin: Secondary | ICD-10-CM

## 2019-10-03 DIAGNOSIS — G8918 Other acute postprocedural pain: Secondary | ICD-10-CM | POA: Diagnosis not present

## 2019-10-03 DIAGNOSIS — Z87891 Personal history of nicotine dependence: Secondary | ICD-10-CM | POA: Diagnosis not present

## 2019-10-03 DIAGNOSIS — Z79890 Hormone replacement therapy (postmenopausal): Secondary | ICD-10-CM | POA: Diagnosis not present

## 2019-10-03 DIAGNOSIS — I110 Hypertensive heart disease with heart failure: Secondary | ICD-10-CM | POA: Diagnosis present

## 2019-10-03 DIAGNOSIS — G8929 Other chronic pain: Secondary | ICD-10-CM | POA: Diagnosis present

## 2019-10-03 DIAGNOSIS — Z79891 Long term (current) use of opiate analgesic: Secondary | ICD-10-CM

## 2019-10-03 DIAGNOSIS — Z981 Arthrodesis status: Secondary | ICD-10-CM

## 2019-10-03 DIAGNOSIS — Z96653 Presence of artificial knee joint, bilateral: Secondary | ICD-10-CM | POA: Diagnosis not present

## 2019-10-03 DIAGNOSIS — Z79899 Other long term (current) drug therapy: Secondary | ICD-10-CM | POA: Diagnosis not present

## 2019-10-03 DIAGNOSIS — E039 Hypothyroidism, unspecified: Secondary | ICD-10-CM | POA: Diagnosis not present

## 2019-10-03 DIAGNOSIS — R58 Hemorrhage, not elsewhere classified: Secondary | ICD-10-CM

## 2019-10-03 DIAGNOSIS — Z96651 Presence of right artificial knee joint: Secondary | ICD-10-CM | POA: Diagnosis present

## 2019-10-03 DIAGNOSIS — I10 Essential (primary) hypertension: Secondary | ICD-10-CM | POA: Diagnosis not present

## 2019-10-03 DIAGNOSIS — H9193 Unspecified hearing loss, bilateral: Secondary | ICD-10-CM | POA: Diagnosis present

## 2019-10-03 HISTORY — PX: TOTAL KNEE ARTHROPLASTY: SHX125

## 2019-10-03 LAB — TYPE AND SCREEN
ABO/RH(D): A POS
Antibody Screen: NEGATIVE

## 2019-10-03 SURGERY — ARTHROPLASTY, KNEE, TOTAL
Anesthesia: General | Site: Knee | Laterality: Left

## 2019-10-03 MED ORDER — FENTANYL CITRATE (PF) 100 MCG/2ML IJ SOLN
INTRAMUSCULAR | Status: DC | PRN
  Administered 2019-10-03 (×3): 50 ug via INTRAVENOUS

## 2019-10-03 MED ORDER — DEXAMETHASONE SODIUM PHOSPHATE 10 MG/ML IJ SOLN
INTRAMUSCULAR | Status: AC
  Filled 2019-10-03: qty 1

## 2019-10-03 MED ORDER — CEFAZOLIN SODIUM-DEXTROSE 2-4 GM/100ML-% IV SOLN
INTRAVENOUS | Status: AC
  Filled 2019-10-03: qty 100

## 2019-10-03 MED ORDER — FLEET ENEMA 7-19 GM/118ML RE ENEM
1.0000 | ENEMA | Freq: Once | RECTAL | Status: DC | PRN
  Filled 2019-10-03: qty 1

## 2019-10-03 MED ORDER — ROCURONIUM BROMIDE 10 MG/ML (PF) SYRINGE
PREFILLED_SYRINGE | INTRAVENOUS | Status: DC | PRN
  Administered 2019-10-03: 10 mg via INTRAVENOUS
  Administered 2019-10-03: 50 mg via INTRAVENOUS

## 2019-10-03 MED ORDER — ROCURONIUM BROMIDE 10 MG/ML (PF) SYRINGE
PREFILLED_SYRINGE | INTRAVENOUS | Status: AC
  Filled 2019-10-03: qty 10

## 2019-10-03 MED ORDER — KETOROLAC TROMETHAMINE 15 MG/ML IJ SOLN
INTRAMUSCULAR | Status: AC
  Filled 2019-10-03: qty 1

## 2019-10-03 MED ORDER — BUPIVACAINE HCL (PF) 0.5 % IJ SOLN
INTRAMUSCULAR | Status: AC
  Filled 2019-10-03: qty 30

## 2019-10-03 MED ORDER — ONDANSETRON HCL 4 MG/2ML IJ SOLN
INTRAMUSCULAR | Status: DC | PRN
  Administered 2019-10-03: 4 mg via INTRAVENOUS

## 2019-10-03 MED ORDER — FENTANYL CITRATE (PF) 250 MCG/5ML IJ SOLN
INTRAMUSCULAR | Status: AC
  Filled 2019-10-03: qty 5

## 2019-10-03 MED ORDER — POVIDONE-IODINE 10 % EX SWAB
2.0000 "application " | Freq: Once | CUTANEOUS | Status: AC
  Administered 2019-10-03: 2 via TOPICAL

## 2019-10-03 MED ORDER — ONDANSETRON HCL 4 MG/2ML IJ SOLN: 4.0000 mg | Freq: Four times a day (QID) | INTRAMUSCULAR | Status: DC | PRN

## 2019-10-03 MED ORDER — ALUM & MAG HYDROXIDE-SIMETH 200-200-20 MG/5ML PO SUSP
30.0000 mL | ORAL | Status: DC | PRN
  Filled 2019-10-03: qty 30

## 2019-10-03 MED ORDER — ONDANSETRON HCL 4 MG PO TABS
4.0000 mg | ORAL_TABLET | Freq: Four times a day (QID) | ORAL | Status: DC | PRN
  Filled 2019-10-03: qty 1

## 2019-10-03 MED ORDER — METOCLOPRAMIDE HCL 5 MG PO TABS
5.0000 mg | ORAL_TABLET | Freq: Three times a day (TID) | ORAL | Status: DC | PRN
  Filled 2019-10-03: qty 2

## 2019-10-03 MED ORDER — CHLORHEXIDINE GLUCONATE 4 % EX LIQD: 60.0000 mL | Freq: Once | CUTANEOUS | Status: DC

## 2019-10-03 MED ORDER — DOCUSATE SODIUM 100 MG PO CAPS
100.0000 mg | ORAL_CAPSULE | Freq: Two times a day (BID) | ORAL | Status: DC
  Filled 2019-10-03: qty 1

## 2019-10-03 MED ORDER — LIDOCAINE 2% (20 MG/ML) 5 ML SYRINGE
INTRAMUSCULAR | Status: AC
  Filled 2019-10-03: qty 5

## 2019-10-03 MED ORDER — LACTATED RINGERS IV BOLUS
250.0000 mL | Freq: Once | INTRAVENOUS | Status: AC
  Administered 2019-10-03: 250 mL via INTRAVENOUS

## 2019-10-03 MED ORDER — PHENYLEPHRINE 40 MCG/ML (10ML) SYRINGE FOR IV PUSH (FOR BLOOD PRESSURE SUPPORT)
PREFILLED_SYRINGE | INTRAVENOUS | Status: DC | PRN
  Administered 2019-10-03: 40 ug via INTRAVENOUS

## 2019-10-03 MED ORDER — PHENOL 1.4 % MT LIQD
1.0000 | OROMUCOSAL | Status: DC | PRN
  Filled 2019-10-03: qty 177

## 2019-10-03 MED ORDER — MIDAZOLAM HCL 5 MG/5ML IJ SOLN
INTRAMUSCULAR | Status: DC | PRN
  Administered 2019-10-03: 2 mg via INTRAVENOUS

## 2019-10-03 MED ORDER — EPHEDRINE SULFATE-NACL 50-0.9 MG/10ML-% IV SOSY
PREFILLED_SYRINGE | INTRAVENOUS | Status: DC | PRN
  Administered 2019-10-03 (×2): 10 mg via INTRAVENOUS

## 2019-10-03 MED ORDER — SUCCINYLCHOLINE CHLORIDE 200 MG/10ML IV SOSY
PREFILLED_SYRINGE | INTRAVENOUS | Status: DC | PRN
  Administered 2019-10-03: 120 mg via INTRAVENOUS

## 2019-10-03 MED ORDER — LIDOCAINE-EPINEPHRINE 1 %-1:100000 IJ SOLN
INTRAMUSCULAR | Status: AC
  Filled 2019-10-03: qty 1

## 2019-10-03 MED ORDER — SODIUM CHLORIDE 0.9 % IV SOLN: INTRAVENOUS | Status: DC

## 2019-10-03 MED ORDER — 0.9 % SODIUM CHLORIDE (POUR BTL) OPTIME
TOPICAL | Status: DC | PRN
  Administered 2019-10-03: 1000 mL

## 2019-10-03 MED ORDER — LIDOCAINE 2% (20 MG/ML) 5 ML SYRINGE
INTRAMUSCULAR | Status: DC | PRN
  Administered 2019-10-03: 100 mg via INTRAVENOUS

## 2019-10-03 MED ORDER — CEFAZOLIN SODIUM-DEXTROSE 2-4 GM/100ML-% IV SOLN
2.0000 g | Freq: Four times a day (QID) | INTRAVENOUS | Status: DC
  Administered 2019-10-03: 2 g via INTRAVENOUS

## 2019-10-03 MED ORDER — METHOCARBAMOL 500 MG PO TABS: 500.0000 mg | ORAL_TABLET | Freq: Three times a day (TID) | ORAL | 0 refills | Status: DC | PRN

## 2019-10-03 MED ORDER — LIDOCAINE-EPINEPHRINE 1 %-1:100000 IJ SOLN
INTRAMUSCULAR | Status: DC | PRN
  Administered 2019-10-03: 20 mL

## 2019-10-03 MED ORDER — DIPHENHYDRAMINE HCL 12.5 MG/5ML PO ELIX
12.5000 mg | ORAL_SOLUTION | ORAL | Status: DC | PRN
  Filled 2019-10-03: qty 10

## 2019-10-03 MED ORDER — TRANEXAMIC ACID 1000 MG/10ML IV SOLN: INTRAVENOUS | Status: DC | PRN

## 2019-10-03 MED ORDER — DEXAMETHASONE SODIUM PHOSPHATE 10 MG/ML IJ SOLN
INTRAMUSCULAR | Status: DC | PRN
  Administered 2019-10-03: 10 mg via INTRAVENOUS

## 2019-10-03 MED ORDER — STERILE WATER FOR IRRIGATION IR SOLN
Status: DC | PRN
  Administered 2019-10-03: 2000 mL

## 2019-10-03 MED ORDER — HYDROMORPHONE HCL 2 MG PO TABS
ORAL_TABLET | ORAL | Status: AC
  Filled 2019-10-03: qty 1

## 2019-10-03 MED ORDER — TRANEXAMIC ACID-NACL 1000-0.7 MG/100ML-% IV SOLN
INTRAVENOUS | Status: AC
  Filled 2019-10-03: qty 100

## 2019-10-03 MED ORDER — LACTATED RINGERS IV SOLN: INTRAVENOUS | Status: DC

## 2019-10-03 MED ORDER — BISACODYL 10 MG RE SUPP
10.0000 mg | Freq: Every day | RECTAL | Status: DC | PRN
  Filled 2019-10-03: qty 1

## 2019-10-03 MED ORDER — ONDANSETRON HCL 4 MG/2ML IJ SOLN
INTRAMUSCULAR | Status: AC
  Filled 2019-10-03: qty 2

## 2019-10-03 MED ORDER — SUGAMMADEX SODIUM 200 MG/2ML IV SOLN
INTRAVENOUS | Status: DC | PRN
  Administered 2019-10-03: 200 mg via INTRAVENOUS

## 2019-10-03 MED ORDER — SODIUM CHLORIDE 0.9 % IR SOLN
Status: DC | PRN
  Administered 2019-10-03 (×2): 1000 mL

## 2019-10-03 MED ORDER — ASPIRIN EC 325 MG PO TBEC
325.0000 mg | DELAYED_RELEASE_TABLET | Freq: Every day | ORAL | Status: DC
  Filled 2019-10-03: qty 1

## 2019-10-03 MED ORDER — MENTHOL 3 MG MT LOZG
1.0000 | LOZENGE | OROMUCOSAL | Status: DC | PRN
  Filled 2019-10-03: qty 9

## 2019-10-03 MED ORDER — PROPOFOL 10 MG/ML IV BOLUS
INTRAVENOUS | Status: AC
  Filled 2019-10-03: qty 20

## 2019-10-03 MED ORDER — MAGNESIUM HYDROXIDE 400 MG/5ML PO SUSP
30.0000 mL | Freq: Every day | ORAL | Status: DC | PRN
  Filled 2019-10-03: qty 30

## 2019-10-03 MED ORDER — KETOROLAC TROMETHAMINE 15 MG/ML IJ SOLN
7.5000 mg | Freq: Four times a day (QID) | INTRAMUSCULAR | Status: DC
  Administered 2019-10-03: 7.5 mg via INTRAVENOUS

## 2019-10-03 MED ORDER — PROPOFOL 10 MG/ML IV BOLUS
INTRAVENOUS | Status: DC | PRN
  Administered 2019-10-03: 200 mg via INTRAVENOUS

## 2019-10-03 MED ORDER — ACETAMINOPHEN 10 MG/ML IV SOLN
INTRAVENOUS | Status: AC
  Filled 2019-10-03: qty 100

## 2019-10-03 MED ORDER — ACETAMINOPHEN 10 MG/ML IV SOLN
1000.0000 mg | Freq: Once | INTRAVENOUS | Status: AC
  Administered 2019-10-03: 1000 mg via INTRAVENOUS

## 2019-10-03 MED ORDER — METOCLOPRAMIDE HCL 5 MG/ML IJ SOLN: 5.0000 mg | Freq: Three times a day (TID) | INTRAMUSCULAR | Status: DC | PRN

## 2019-10-03 MED ORDER — METHOCARBAMOL 500 MG PO TABS: 500.0000 mg | ORAL_TABLET | Freq: Four times a day (QID) | ORAL | Status: DC | PRN

## 2019-10-03 MED ORDER — CEFAZOLIN SODIUM-DEXTROSE 2-4 GM/100ML-% IV SOLN
2.0000 g | INTRAVENOUS | Status: AC
  Administered 2019-10-03: 2 g via INTRAVENOUS
  Filled 2019-10-03: qty 100

## 2019-10-03 MED ORDER — HYDROMORPHONE HCL 2 MG PO TABS
2.0000 mg | ORAL_TABLET | ORAL | Status: DC | PRN
  Administered 2019-10-03: 2 mg via ORAL

## 2019-10-03 MED ORDER — OXYCODONE-ACETAMINOPHEN 5-325 MG PO TABS
1.0000 | ORAL_TABLET | Freq: Once | ORAL | Status: AC
  Administered 2019-10-03: 1 via ORAL
  Filled 2019-10-03: qty 1

## 2019-10-03 MED ORDER — HYDROMORPHONE HCL 1 MG/ML IJ SOLN
0.2500 mg | INTRAMUSCULAR | Status: DC | PRN
  Administered 2019-10-03 (×4): 0.5 mg via INTRAVENOUS

## 2019-10-03 MED ORDER — LACTATED RINGERS IV BOLUS
500.0000 mL | Freq: Once | INTRAVENOUS | Status: AC
  Administered 2019-10-03: 500 mL via INTRAVENOUS

## 2019-10-03 MED ORDER — METHOCARBAMOL 500 MG IVPB - SIMPLE MED
500.0000 mg | Freq: Four times a day (QID) | INTRAVENOUS | Status: DC | PRN
  Administered 2019-10-03: 500 mg via INTRAVENOUS

## 2019-10-03 MED ORDER — TRANEXAMIC ACID-NACL 1000-0.7 MG/100ML-% IV SOLN
INTRAVENOUS | Status: DC | PRN
  Administered 2019-10-03: 1000 mg via INTRAVENOUS

## 2019-10-03 MED ORDER — MIDAZOLAM HCL 2 MG/2ML IJ SOLN
INTRAMUSCULAR | Status: AC
  Filled 2019-10-03: qty 2

## 2019-10-03 MED ORDER — HYDROMORPHONE HCL 1 MG/ML IJ SOLN
INTRAMUSCULAR | Status: AC
  Filled 2019-10-03: qty 2

## 2019-10-03 MED ORDER — METHOCARBAMOL 500 MG IVPB - SIMPLE MED
INTRAVENOUS | Status: AC
  Filled 2019-10-03: qty 50

## 2019-10-03 MED ORDER — BUPIVACAINE HCL 0.5 % IJ SOLN
INTRAMUSCULAR | Status: DC | PRN
  Administered 2019-10-03: 20 mL

## 2019-10-03 MED ORDER — TRANEXAMIC ACID-NACL 1000-0.7 MG/100ML-% IV SOLN
1000.0000 mg | Freq: Once | INTRAVENOUS | Status: AC
  Administered 2019-10-03: 1000 mg via INTRAVENOUS

## 2019-10-03 MED ORDER — HYDROMORPHONE HCL 2 MG PO TABS: 2.0000 mg | ORAL_TABLET | Freq: Two times a day (BID) | ORAL | 0 refills | Status: AC | PRN

## 2019-10-03 MED ORDER — HYDROMORPHONE HCL 1 MG/ML IJ SOLN: 0.5000 mg | INTRAMUSCULAR | Status: DC | PRN

## 2019-10-03 SURGICAL SUPPLY — 53 items
BAG DECANTER FOR FLEXI CONT (MISCELLANEOUS) ×2 IMPLANT
BAG SPEC THK2 15X12 ZIP CLS (MISCELLANEOUS) ×1
BAG ZIPLOCK 12X15 (MISCELLANEOUS) ×2 IMPLANT
BLADE SAGITTAL 25.0X1.19X90 (BLADE) ×2 IMPLANT
BOWL SMART MIX CTS (DISPOSABLE) ×2 IMPLANT
CEMENT HV SMART SET (Cement) ×4 IMPLANT
CEMENT TIBIA MBT SIZE 4 (Knees) IMPLANT
COMP FEM CEM STD LT LCS (Orthopedic Implant) ×2 IMPLANT
COMP PATELLA PEGX3 CEM STAN (Knees) ×2 IMPLANT
COMPONENT FEM CEM STD LT LCS (Orthopedic Implant) IMPLANT
COMPONENT PTLLA PEGX3 CEM STAN (Knees) IMPLANT
COVER SURGICAL LIGHT HANDLE (MISCELLANEOUS) ×2 IMPLANT
COVER WAND RF STERILE (DRAPES) IMPLANT
CUFF TOURN SGL QUICK 34 (TOURNIQUET CUFF) ×2
CUFF TRNQT CYL 34X4.125X (TOURNIQUET CUFF) ×1 IMPLANT
DECANTER SPIKE VIAL GLASS SM (MISCELLANEOUS) ×2 IMPLANT
DRAPE SHEET LG 3/4 BI-LAMINATE (DRAPES) ×4 IMPLANT
DRSG ADAPTIC 3X8 NADH LF (GAUZE/BANDAGES/DRESSINGS) ×2 IMPLANT
DRSG PAD ABDOMINAL 8X10 ST (GAUZE/BANDAGES/DRESSINGS) ×2 IMPLANT
DURAPREP 26ML APPLICATOR (WOUND CARE) ×4 IMPLANT
ELECT REM PT RETURN 15FT ADLT (MISCELLANEOUS) ×2 IMPLANT
GAUZE SPONGE 4X4 12PLY STRL (GAUZE/BANDAGES/DRESSINGS) ×2 IMPLANT
GLOVE BIOGEL PI IND STRL 8 (GLOVE) ×1 IMPLANT
GLOVE BIOGEL PI IND STRL 8.5 (GLOVE) ×1 IMPLANT
GLOVE BIOGEL PI INDICATOR 8 (GLOVE) ×1
GLOVE BIOGEL PI INDICATOR 8.5 (GLOVE) ×1
GLOVE ECLIPSE 8.0 STRL XLNG CF (GLOVE) ×4 IMPLANT
GLOVE ECLIPSE 8.5 STRL (GLOVE) ×4 IMPLANT
GOWN STRL REUS W/ TWL LRG LVL3 (GOWN DISPOSABLE) ×1 IMPLANT
GOWN STRL REUS W/TWL 2XL LVL3 (GOWN DISPOSABLE) ×2 IMPLANT
GOWN STRL REUS W/TWL LRG LVL3 (GOWN DISPOSABLE) ×2
HANDPIECE INTERPULSE COAX TIP (DISPOSABLE) ×2
HOLDER FOLEY CATH W/STRAP (MISCELLANEOUS) IMPLANT
INSERT TIB LCS RP STD 10 (Knees) ×1 IMPLANT
KIT TURNOVER KIT A (KITS) IMPLANT
MANIFOLD NEPTUNE II (INSTRUMENTS) ×2 IMPLANT
NS IRRIG 1000ML POUR BTL (IV SOLUTION) ×2 IMPLANT
PACK TOTAL KNEE CUSTOM (KITS) ×2 IMPLANT
PADDING CAST COTTON 6X4 STRL (CAST SUPPLIES) ×3 IMPLANT
PENCIL SMOKE EVACUATOR (MISCELLANEOUS) IMPLANT
PIN STEINMAN FIXATION KNEE (PIN) ×1 IMPLANT
PROTECTOR NERVE ULNAR (MISCELLANEOUS) ×2 IMPLANT
SET HNDPC FAN SPRY TIP SCT (DISPOSABLE) ×1 IMPLANT
STAPLER VISISTAT 35W (STAPLE) ×2 IMPLANT
SUT BONE WAX W31G (SUTURE) ×2 IMPLANT
SUT ETHIBOND NAB CT1 #1 30IN (SUTURE) ×4 IMPLANT
SUT MNCRL AB 3-0 PS2 18 (SUTURE) ×2 IMPLANT
SUT VIC AB 2-0 PS2 27 (SUTURE) ×2 IMPLANT
TIBIA MBT CEMENT SIZE 4 (Knees) ×2 IMPLANT
TRAY FOLEY MTR SLVR 16FR STAT (SET/KITS/TRAYS/PACK) ×2 IMPLANT
UNDERPAD 30X36 HEAVY ABSORB (UNDERPADS AND DIAPERS) ×2 IMPLANT
WATER STERILE IRR 1000ML POUR (IV SOLUTION) ×4 IMPLANT
WRAP KNEE MAXI GEL POST OP (GAUZE/BANDAGES/DRESSINGS) ×2 IMPLANT

## 2019-10-03 NOTE — Discharge Instructions (Addendum)
Watch for further bleeding.  Watch for lightheadedness or dizziness.  Follow-up with your Surgeon.

## 2019-10-03 NOTE — H&P (Signed)
The recent History & Physical has been reviewed. I have personally examined the patient today. There is no interval change to the documented History & Physical. The patient would like to proceed with the procedure.  Garald Balding 10/03/2019,  7:10 AM

## 2019-10-03 NOTE — Progress Notes (Signed)
PACU NURSING NOTE: just rec a phone call from pt and family member, states upon arrival to home post PACU after a Left TKA pt noted "a lot of bleeding". Pt by phone instructed to remove knee immobilizer and ice wrap, was informed by pt that ace wrap was very bloody and active bleeding noted, "the blood is running down my leg" . Instructed pt to not remove ace wrap, to cover leg with clean towel and come to the [facility] ED by car. Called ED Charge RN to inform of pt arriving to ED.

## 2019-10-03 NOTE — ED Notes (Signed)
Dr. Alvino Chapel came to bedside to assess the surgical wound. He removed the dressing and squeezed some of the pooling blood from under the stables. Using clean technique, cleansed wound and applied a surgical island dressing. Awaiting a new thigh-high compression stocking. Sophronia Simas tech has brought knee immobilizer and ace wrap for securement of surgical sight.

## 2019-10-03 NOTE — Op Note (Signed)
PATIENT ID:      Larry [name]  MRN:     [id number] DOB/AGE:    Aug 08, 1951 / 69 y.o.       OPERATIVE REPORT    DATE OF PROCEDURE:  10/03/2019       PREOPERATIVE DIAGNOSIS:   END STAGE OSTEOARTHRITIS LEFT KNEE                                                       Estimated body mass index is 27.79 kg/m as calculated from the following:   Height as of this encounter: 5\' 6"  (1.676 m).   Weight as of this encounter: 78.1 kg.     POSTOPERATIVE DIAGNOSIS:   SAME                                                                  Estimated body mass index is 27.79 kg/m as calculated from the following:   Height as of this encounter: 5\' 6"  (1.676 m).   Weight as of this encounter: 78.1 kg.     PROCEDURE:  Procedure(s): LEFT TOTAL KNEE ARTHROPLASTY     SURGEON:  Joni Fears, MD    ASSISTANT:   Biagio Borg, PA-C   (Present and scrubbed throughout the case, critical for assistance with exposure, retraction, instrumentation, and closure.)          ANESTHESIA: regional and general     DRAINS: none :      TOURNIQUET TIME:  Total Tourniquet Time Documented: Thigh (Left) - 83 minutes Total: Thigh (Left) - 83 minutes     COMPLICATIONS:  None   CONDITION:  stable  PROCEDURE IN DETAIL: [id number]   Larry [name] 10/03/2019, 9:24 AM

## 2019-10-03 NOTE — Anesthesia Procedure Notes (Signed)
Procedure Name: Intubation Date/Time: 10/03/2019 7:27 AM Performed by: Gerald Leitz, CRNA Pre-anesthesia Checklist: Patient identified, Patient being monitored, Timeout performed, Emergency Drugs available and Suction available Patient Re-evaluated:Patient Re-evaluated prior to induction Oxygen Delivery Method: Circle system utilized Preoxygenation: Pre-oxygenation with 100% oxygen Induction Type: IV induction and Rapid sequence Ventilation: Mask ventilation without difficulty Laryngoscope Size: Mac and 4 Grade View: Grade III Tube type: Oral Tube size: 7.0 mm Number of attempts: 1 Airway Equipment and Method: Stylet and Video-laryngoscopy Placement Confirmation: ETT inserted through vocal cords under direct vision,  positive ETCO2 and breath sounds checked- equal and bilateral Secured at: 21 cm Tube secured with: Tape Dental Injury: Teeth and Oropharynx as per pre-operative assessment  Difficulty Due To: Difficulty was unanticipated, Difficult Airway- due to anterior larynx and Difficult Airway-  due to neck instability Future Recommendations: Recommend- induction with short-acting agent, and alternative techniques readily available Comments: Glide Scope used x 1 attempt, Anterior airway

## 2019-10-03 NOTE — ED Triage Notes (Signed)
Patient is from home. He had a left knee replacement done today by Dr. Joni Fears. He states he was sitting up at the kitchen table from getting into the house. He states he looked down and saw a puddle of blood.

## 2019-10-03 NOTE — ED Provider Notes (Signed)
[facility] DEPT Provider Note   CSN: [id number] Arrival date & time: 10/03/19  1542     History Chief Complaint  Patient presents with  . Post-op Problem    Larry [name] is a 69 y.o. male.  HPI Patient had a left knee replacement by Dr. Durward Fortes earlier today.  Had bleeding from the wound.  Feels fine otherwise.  No lightheadedness or dizziness.States he had a little bit of bleeding from her ears IV but otherwise feels fine.  Nursing and discussed with Dr. Durward Fortes and instructed to basically clean the wound and redressed.    Past Medical History:  Diagnosis Date  . Arthritis    "knees; left shoulder" (04/12/2013)  . Asthma    "as a child" (04/12/2013)  . DDD (degenerative disc disease), cervical   . DDD (degenerative disc disease), lumbar   . Difficult intubation    2012   CERVICAL FUSION   . Headache(784.0)    VERAPAMIL FOR PREVENTION   . 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 History of chicken pox    as an adult  . Hypoglycemia    last episode 1 yr. ago, just gets jittery  . Hypothyroidism   . Lumbar stenosis   . MRSA (methicillin resistant staph aureus) culture positive 09/10/2019   upper arm  . OSA (obstructive sleep apnea)    "haven't wore mask in years; had OR; still snore" (04/12/2013)  . Urinary frequency     Patient Active Problem List   Diagnosis Date Noted  . S/P TKR (total knee replacement) using cement, left 10/03/2019  . Cellulitis of arm 08/28/2019  . Essential hypertension 02/16/2018  . Sepsis due to skin infection ([place]) 02/16/2018  . Chronic pain 02/16/2018  . Cellulitis of left lower extremity   . Lumbar radiculopathy, chronic 01/24/2018  . Lumbar stenosis with neurogenic claudication 03/23/2017  . Hypothyroidism 07/25/2015  . Primary osteoarthritis of right knee 07/23/2015  . Primary osteoarthritis of knee  07/23/2015    Past Surgical History:  Procedure Laterality Date  . BACK SURGERY    . CERVICAL FUSION  2002; 2012  . CYSTECTOMY    . INGUINAL HERNIA REPAIR Bilateral 1997  . KNEE ARTHROSCOPY Left ~ 1997; ~ 2005  . LUMBAR FUSION  2007; 04/11/2013  . PENILE DEBRIDEMENT  ~ 2011   X 3  FOR MRSA INFECTION    . SHOULDER ARTHROSCOPY Left ~ 2003  . TONSILLECTOMY  1990's  . TOTAL KNEE ARTHROPLASTY Right 07/23/2015  . TOTAL KNEE ARTHROPLASTY Right 07/23/2015   Procedure: TOTAL KNEE ARTHROPLASTY;  Surgeon: Garald Balding, MD;  Location: [facility];  Service: Orthopedics;  Laterality: Right;  . UVULOPALATOPHARYNGOPLASTY (UPPP)/TONSILLECTOMY/SEPTOPLASTY  1990's  . VASECTOMY         History reviewed. No pertinent family history.  Social History   Tobacco Use  . Smoking status: Former Smoker    Packs/day: 0.50    Years: 15.00    Pack years: 7.50    Types: Cigarettes    Quit date: 10/14/1986    Years since quitting: 32.9  . Smokeless tobacco: Never Used  Substance Use Topics  . Alcohol use: Yes    Comment: occasional  . Drug use: No    Home Medications Prior to Admission medications   Medication Sig Start Date End Date Taking? Authorizing Provider  aspirin EC 81 MG tablet Take 81 mg  by mouth daily.    [provider]  furosemide (LASIX) 40 MG tablet Take 40 mg by mouth daily as needed for fluid or edema.     [provider]  HYDROcodone-acetaminophen (NORCO) 5-325 MG tablet Take 1 tablet by mouth every 4 (four) hours as needed for moderate pain. 08/27/19   Daleen Bo, MD  HYDROmorphone (DILAUDID) 2 MG tablet Take 1 tablet (2 mg total) by mouth every 12 (twelve) hours as needed for up to 5 days for severe pain. 10/03/19 10/08/19  Cherylann Ratel, PA-C  latanoprost (XALATAN) 0.005 % ophthalmic solution Place 1 drop into both eyes at bedtime. 03/13/17   [provider]  levothyroxine (SYNTHROID) 200 MCG tablet Take 200 mcg by mouth daily before breakfast.      [provider]  LORazepam (ATIVAN) 0.5 MG tablet Take 0.5-1 mg by mouth at bedtime.  03/14/17   [provider]  losartan (COZAAR) 50 MG tablet Take 50 mg by mouth daily.  07/17/19   [provider]  meloxicam (MOBIC) 15 MG tablet Take 15 mg by mouth daily.  09/27/18   [provider]  methocarbamol (ROBAXIN) 500 MG tablet Take 1 tablet (500 mg total) by mouth every 8 (eight) hours as needed for muscle spasms. 10/03/19   Cherylann Ratel, PA-C  Multiple Vitamin (MULTIVITAMIN WITH MINERALS) TABS tablet Take 1 tablet by mouth daily.    [provider]  Sildenafil Citrate (VIAGRA PO) Take 30-60 mg by mouth daily as needed (for erectile dysfunction). Purchased from [employer]--dose indicated on website as 30 mg     [provider]  tamsulosin (FLOMAX) 0.4 MG CAPS capsule Take 0.4 mg by mouth daily.    [provider]  testosterone enanthate (DELATESTRYL) 200 MG/ML injection Inject 200 mg into the muscle every 14 (fourteen) days. Fridays. 05/18/19   [provider]  verapamil (CALAN-SR) 180 MG CR tablet Take 180 mg by mouth daily. 12/31/16   [provider]    Allergies    Patient has no known allergies.  Review of Systems   Review of Systems  Constitutional: Negative for appetite change and fatigue.  Respiratory: Negative for shortness of breath.   Cardiovascular: Negative for chest pain.  Gastrointestinal: Negative for abdominal pain.  Musculoskeletal: Negative for back pain.  Neurological: Negative for light-headedness.  Hematological: Does not bruise/bleed easily.    Physical Exam Updated Vital Signs BP (!) 157/98 (BP Location: Right Arm)   Pulse 87   Temp 98.4 F (36.9 C) (Oral)   Resp 18   Ht 5\' 6"  (1.676 m)   Wt 74.8 kg   SpO2 99%   BMI 26.63 kg/m   Physical Exam Vitals and nursing note reviewed.  Cardiovascular:     Rate and Rhythm: Regular rhythm.  Musculoskeletal:     Comments: Left leg  postsurgical.  Dressing over wound with bleeding on the inferior aspect.  Dressing removed and states staples in place.  Does have some bleeding.  Hematoma expressed.  Skin:    General: Skin is warm.     Capillary Refill: Capillary refill takes less than 2 seconds.  Neurological:     Mental Status: He is alert.     ED Results / Procedures / Treatments   Labs (all labs ordered are listed, but only abnormal results are displayed) Labs Reviewed - No data to display  EKG None  Radiology No results found.  Procedures Procedures (including critical care time)  Medications Ordered in ED Medications -  No data to display  ED Course  I have reviewed the triage vital signs and the nursing notes.  Pertinent labs & imaging results that were available during my care of the patient were reviewed by me and considered in my medical decision making (see chart for details).    MDM Rules/Calculators/A&P                      Patient with postsurgical bleeding.  Well-appearing.  Hematoma expressed and redressed per surgery instructions. Final Clinical Impression(s) / ED Diagnoses Final diagnoses:  Bleeding    Rx / DC Orders ED Discharge Orders    None       Davonna Belling, MD 10/03/19 [phone]

## 2019-10-03 NOTE — Telephone Encounter (Signed)
Left VM for patient after discharge same day from hospital after surgery.

## 2019-10-03 NOTE — Op Note (Signed)
NAME: Larry [name], Larry [name] MEDICAL RECORD EX:[id number] ACCOUNT [id number] DATE OF BIRTH:Jan 21, 1951 FACILITY: [facility] LOCATION: [facility] PHYSICIAN:PETER Sharlotte Alamo, MD  OPERATIVE REPORT  DATE OF PROCEDURE:  10/03/2019  PREOPERATIVE DIAGNOSIS:  End-stage osteoarthritis, left knee.  POSTOPERATIVE DIAGNOSIS:  End-stage osteoarthritis, left knee.  PROCEDURE:  Left total knee replacement.  SURGEON:  Joni Fears, MD  ASSISTANT:  Biagio Borg, PA-C  ANESTHESIA:  General with adductor canal block.  COMPLICATIONS:  None.  COMPONENTS:  DePuy LCS standard femoral component, a #4 rotating keeled tibial tray with a 10 mm polyethylene bridging bearing and metal backed 3 peg rotating patella.  Components were secured with polymethyl methacrylate.  DESCRIPTION OF PROCEDURE:  The patient was met in the holding area and identified.  The left knee as the appropriate operative site and marked it accordingly.  Anesthesia performed an adductor canal block.  The patient was then transported to room #4.  He was placed under general anesthesia without difficulty.  Nursing staff inserted a Foley catheter.  Urine was clear.  Left lower extremity was then placed in a thigh tourniquet.  Left lower extremity was then prepared with chlorhexidine scrub and then DuraPrep x2 from the tourniquet to the tips of the toes.  Sterile draping was performed.  A timeout was called.  A gram of TXA was administered intravenously.  Left lower extremity was then elevated, it was Esmarch exsanguinated with a proximal tourniquet at 325 mmHg.  A midline longitudinal incision was made centered about the patella extending from the superior pouch to the tibial tubercle.  Via sharp dissection, incision carried down to subcutaneous tissue.  First layer of capsule was incised in the midline.  A  medial parapatellar incision was then made with the Bovie.  The joint was entered.  There was a clear yellow joint effusion of about  25-30 mL.  There was a mild to moderate amount of synovitis.  Synovectomy was performed.  There was a varus deformity preoperatively.  I could just about place it in neutral.  I did a partial medial release subperiosteally with a Freer at which point he had normal alignment.  There were large osteophytes along the medial and lateral femoral  condyle, which were removed.  There was complete absence of articular cartilage in the femoral condyle and tibial plateau medially.  I measured a standard femoral component.  First bony cut was then made transversely in the proximal tibia with a 7 degree angle of declination using the external tibial guide.  After each bony cut with both the femur and the tibia, I checked my alignment with the external guide.  Subsequent cuts were then made on the femur using the standard femoral guide.  I measured flexion and extension gap symmetrically at 10 mm.  MCL and LCL remained intact throughout the procedure.  Retractors were then placed about the medial and lateral compartment.  I inserted the laminar spreader and removed medial and lateral menisci, ACL and PCL.  I also removed osteophytes from the posterior femoral condyles medially and laterally with a 3/4  inch curved osteotome.  Again, I checked flexion and extension gaps, which were symmetrical at 10 mm.  Distal femoral valgus cut was made in 4 degrees of valgus.  Finishing guide was then applied to obtain the taper cuts in the center hole.  Retractors were then placed around the tibia.  I measured a #4 tibial tray, alignment was checked and this was pinned in place.  The central hole was then made followed by  the keeled cut.  With the tibial jig in place, the 10 mm polyethylene bridging bearing was inserted followed by the standard femoral component.  Through a full range of motion the tibia remained stable and aligned.  I thought it had excellent flexion and extension and no  opening with varus or valgus  stress.  Patella was prepared by removing about 13 mm of bone, leaving 13 mm of patella thickness.  The patella guide was applied, 3 holes made and the trial patella inserted and reduced.  Through a full range of motion remained stable.  Trial components were then removed.  I copiously irrigated the joint with saline solution.  We then applied the final components with polymethyl methacrylate.  The 1st component was the #4 tibial tray.  This was impacted and then the 10 mm polyethylene bridging bearing applied.  This was followed by the standard femoral component.  With the knee reduced, we had excellent alignment.  Extraneous methacrylate was  removed from the periphery of the components.  Patella was applied with methacrylate and a patellar clamp.  At approximately 16 minutes the methacrylate had matured during which time we removed any further extraneous methacrylate and injected the joint with 0.25% Marcaine with epinephrine.  Tourniquet was deflated at 83 minutes.  Excellent capillary refill to the joint surface.  Gross bleeders were Bovie coagulated.  With a nice dry field the joint was closed in several layers.  The deep capsule was closed with a running #1 Ethibond  superficial capsule with Vicryl and the subcutaneous with 3-0 Monocryl.  Skin was closed with skin clips.  Sterile bulky dressing was applied followed by the patient's support stocking.  PLAN:  Monitor in recovery room.  Possible discharge tonight, otherwise in the morning.  Dilaudid for pain.  CN/NUANCE  D:10/03/2019 T:10/03/2019 JOB:[id number]

## 2019-10-03 NOTE — Anesthesia Procedure Notes (Addendum)
Anesthesia Regional Block: Adductor canal block   Pre-Anesthetic Checklist: ,, timeout performed, Correct Patient, Correct Site, Correct Laterality, Correct Procedure, Correct Position, site marked, Risks and benefits discussed,  Surgical consent,  Pre-op evaluation,  At surgeon's request and post-op pain management  Laterality: Left  Prep: chloraprep       Needles:  Injection technique: Single-shot  Needle Type: Echogenic Stimulator Needle          Additional Needles:   Procedures: Doppler guided,,,, ultrasound used (permanent image in chart),,,,  Narrative:  Start time: 10/03/2019 6:50 AM End time: 10/03/2019 7:15 AM Injection made incrementally with aspirations every 5 mL.  Performed by: With CRNAs  Anesthesiologist: Belinda Block, MD

## 2019-10-03 NOTE — Care Plan (Signed)
RNCM call to patient and left VM regarding his return to the ED today and instructions per Dr. Durward Fortes. Reinforced education to stay in knee immobilizer overnight, ice if needed and to rest; CM will check on status in the morning. CM has also spoken with [facility] liaison and someone will be contacting him tomorrow to hopefully see him in his home tomorrow.

## 2019-10-03 NOTE — ED Notes (Signed)
An After Visit Summary was printed and given to the patient. Discharge instructions given and no further questions at this time.  

## 2019-10-03 NOTE — Evaluation (Signed)
Physical Therapy Evaluation Patient Details Name: Larry [name] MRN: [id number] DOB: 1951-04-30 Today's Date: 10/03/2019   History of Present Illness  Patient is 69 y.o. male s/p Lt TKA on 10/03/19 with PMH significant for hypothyroidism, OA, asthma, back pain s/p lumbar fusion and cervical fusion.    Clinical Impression  Larry [name] is a 69 y.o. male POD 0 s/p Lt TKA. Patient reports independence with mobility at baseline. Patient is now limited by functional impairments (see PT problem list below) and requires supervision for transfers and gait with RW. Patient was able to ambulate ~100 feet with RW and supervision and cues for safe walker management. Patient educated on safe sequencing for stair mobility and verbalized safe guarding position for people assisting with mobility. Patient instructed in exercises to facilitate ROM and circulation. Patient will benefit from continued skilled PT interventions to address impairments and progress towards PLOF. Patient has met mobility goals at adequate level for discharge home; will continue to follow if pt continues acute stay to progress towards Mod I goals.     Follow Up Recommendations Follow surgeon's recommendation for DC plan and follow-up therapies    Equipment Recommendations  None recommended by PT    Recommendations for Other Services       Precautions / Restrictions Precautions Precautions: Fall Restrictions Weight Bearing Restrictions: No      Mobility  Bed Mobility Overal bed mobility: Needs Assistance Bed Mobility: Supine to Sit     Supine to sit: Supervision     General bed mobility comments: pt taking extra time, no assistance required  Transfers Overall transfer level: Needs assistance Equipment used: Rolling walker (2 wheeled) Transfers: Sit to/from Stand Sit to Stand: Supervision         General transfer comment: cues for safe hand placement and technique with RW, no assist required for power  up  Ambulation/Gait Ambulation/Gait assistance: Supervision Gait Distance (Feet): 100 Feet Assistive device: Rolling walker (2 wheeled) Gait Pattern/deviations: Step-through pattern;Decreased stride length;Decreased step length - right;Decreased stance time - left Gait velocity: decreased   General Gait Details: cuse for safe step pattern and to maintain safe proximity to RW throughout, no overt LOB noted  Stairs Stairs: Yes Stairs assistance: Supervision Stair Management: Two rails;One rail Left;Sideways;Forwards;Step to pattern Number of Stairs: 6(2x 3) General stair comments: cues for safe step to pattern "up with good, down with bad" and to use 2 hand rails for forwards stair mobility. Also educated on safe side step pattern with 1 hand rail as alterantive. Pt verbalized safe understanding for guarding position for person(s) assist him to return home.  Wheelchair Mobility    Modified Rankin (Stroke Patients Only)       Balance Overall balance assessment: Needs assistance Sitting-balance support: Feet supported Sitting balance-Leahy Scale: Good     Standing balance support: During functional activity;Bilateral upper extremity supported Standing balance-Leahy Scale: Fair                Pertinent Vitals/Pain Pain Assessment: 0-10 Pain Score: 6  Pain Location: lt knee Pain Descriptors / Indicators: Aching;Burning Pain Intervention(s): Limited activity within patient's tolerance;Monitored during session    [place] expects to be discharged to:: Private residence Living Arrangements: Alone Available Help at Discharge: Family;Available PRN/intermittently(pt's daughter will stay with him tonight and is availble intermittently after that) Type of Home: House Home Access: Stairs to enter Entrance Stairs-Rails: Right;Left Entrance Stairs-Number of Steps: 3 at front 5 at back Home Layout: One level Home Equipment: Gilford Rile -  2 wheels;Cane - single  point;Crutches;Bedside commode;Grab bars - tub/shower Additional Comments: pt divorced and lives alone, daughter can stay tonight to help him but not more than a few days.    Prior Function Level of Independence: Independent               Hand Dominance   Dominant Hand: Right    Extremity/Trunk Assessment   Upper Extremity Assessment Upper Extremity Assessment: Overall WFL for tasks assessed    Lower Extremity Assessment Lower Extremity Assessment: LLE deficits/detail LLE Deficits / Details: good quad activation and no extensor lag with SLR LLE Sensation: WNL LLE Coordination: WNL    Cervical / Trunk Assessment Cervical / Trunk Assessment: Normal  Communication   Communication: No difficulties  Cognition Arousal/Alertness: Awake/alert Behavior During Therapy: WFL for tasks assessed/performed Overall Cognitive Status: Within Functional Limits for tasks assessed           General Comments      Exercises Total Joint Exercises Ankle Circles/Pumps: AROM;Both;Seated;10 reps Quad Sets: AROM;5 reps;Seated;Left Short Arc Quad: AROM;5 reps;Seated;Left Heel Slides: AAROM;5 reps;Seated;Left Hip ABduction/ADduction: 5 reps;AROM;Seated;Left Straight Leg Raises: AROM;5 reps;Seated;Left Long Arc Quad: AROM;5 reps;Seated;Left Knee Flexion: AAROM;5 reps;Seated;Left   Assessment/Plan    PT Assessment Patient needs continued PT services  PT Problem List Decreased range of motion;Decreased strength;Decreased activity tolerance;Decreased balance;Decreased mobility       PT Treatment Interventions DME instruction;Functional mobility training;Therapeutic activities;Gait training;Stair training;Therapeutic exercise;Balance training;Patient/family education    PT Goals (Current goals can be found in the Care Plan section)  Acute Rehab PT Goals Patient Stated Goal: to go home today PT Goal Formulation: With patient Time For Goal Achievement: 10/10/19 Potential to Achieve  Goals: Good    Frequency 7X/week    AM-PAC PT "6 Clicks" Mobility  Outcome Measure Help needed turning from your back to your side while in a flat bed without using bedrails?: A Little Help needed moving from lying on your back to sitting on the side of a flat bed without using bedrails?: A Little Help needed moving to and from a bed to a chair (including a wheelchair)?: A Little Help needed standing up from a chair using your arms (e.g., wheelchair or bedside chair)?: A Little Help needed to walk in hospital room?: A Little Help needed climbing 3-5 steps with a railing? : A Little 6 Click Score: 18    End of Session Equipment Utilized During Treatment: Gait belt Activity Tolerance: Patient tolerated treatment well Patient left: with call bell/phone within reach;in chair Nurse Communication: Mobility status PT Visit Diagnosis: Muscle weakness (generalized) (M62.81);Difficulty in walking, not elsewhere classified (R26.2)    Time: 6389-3734 PT Time Calculation (min) (ACUTE ONLY): 30 min   Charges:   PT Evaluation $PT Eval Low Complexity: 1 Low PT Treatments $Gait Training: 8-22 mins        Verner Mould, DPT Physical Therapist with [facility] [phone]  10/03/2019 3:16 PM

## 2019-10-03 NOTE — Transfer of Care (Signed)
Immediate Anesthesia Transfer of Care Note  Patient: Larry [name]  Procedure(s) Performed: Procedure(s): LEFT TOTAL KNEE ARTHROPLASTY (Left)  Patient Location: PACU  Anesthesia Type:General  Level of Consciousness: Alert, Awake, Oriented  Airway & Oxygen Therapy: Patient Spontanous Breathing  Post-op Assessment: Report given to RN  Post vital signs: Reviewed and stable  Last Vitals:  Vitals:   10/03/19 0538 10/03/19 1001  BP: (!) 155/84   Pulse: (!) 59   Resp: 16   Temp: (!) 36.4 C (P) 36.6 C  SpO2: [id number]     Complications: No apparent anesthesia complications

## 2019-10-03 NOTE — Care Plan (Signed)
Ortho Bundle Case Management Note  Patient Details  Name: Larry [name] MRN: [id number] Date of Birth: 10-31-1950  [facility] met with patient in office during his H&P appointment with Biagio Borg, PA-C for Dr. Durward Fortes prior to surgery. Reviewed that patient is an Ortho bundle through [facility]. Reviewed all pre- and post-op instructions related to his upcoming Left TKA on 10/03/19 with Dr. Durward Fortes. Patient reports he lives alone, but his daughter will be able to help for several days after discharge. Anticipate home health PT will be needed briefly for therapy after short hospital stay. Choice provided. Referral to [facility]. Patient requested a 3in1, FWW and CPM for home use. These were supposed to have been delivered to his home prior to surgery on 10/02/19. F/U scheduled for 2 weeks post-op on 10/16/18 at 1:00 pm. Discussed OPPT which has already been scheduled for [facility] location on 10/17/19 at 2:45 pm. Business card and TOM/THN information sheet provided. Will continue to assess for CM needs.          DME Arranged:  3-N-1, CPM, Walker rolling DME Agency:  [employer]  HH Arranged:  PT [place] Agency:  [facility] (now [facility])  Additional Comments: Please contact me with any questions of if this plan should need to change.  Jamse Arn, RN, BSN, [employer]  [phone] 10/03/2019, 10:36 AM

## 2019-10-04 ENCOUNTER — Telehealth: Payer: Self-pay | Admitting: *Deleted

## 2019-10-04 ENCOUNTER — Encounter: Payer: Self-pay | Admitting: *Deleted

## 2019-10-04 ENCOUNTER — Other Ambulatory Visit: Payer: Self-pay | Admitting: Orthopaedic Surgery

## 2019-10-04 DIAGNOSIS — E039 Hypothyroidism, unspecified: Secondary | ICD-10-CM | POA: Diagnosis not present

## 2019-10-04 DIAGNOSIS — Z96653 Presence of artificial knee joint, bilateral: Secondary | ICD-10-CM | POA: Diagnosis not present

## 2019-10-04 DIAGNOSIS — M5136 Other intervertebral disc degeneration, lumbar region: Secondary | ICD-10-CM | POA: Diagnosis not present

## 2019-10-04 DIAGNOSIS — Z87891 Personal history of nicotine dependence: Secondary | ICD-10-CM | POA: Diagnosis not present

## 2019-10-04 DIAGNOSIS — M5030 Other cervical disc degeneration, unspecified cervical region: Secondary | ICD-10-CM | POA: Diagnosis not present

## 2019-10-04 DIAGNOSIS — M5417 Radiculopathy, lumbosacral region: Secondary | ICD-10-CM | POA: Diagnosis not present

## 2019-10-04 DIAGNOSIS — G4733 Obstructive sleep apnea (adult) (pediatric): Secondary | ICD-10-CM | POA: Diagnosis not present

## 2019-10-04 DIAGNOSIS — H9190 Unspecified hearing loss, unspecified ear: Secondary | ICD-10-CM | POA: Diagnosis not present

## 2019-10-04 DIAGNOSIS — I10 Essential (primary) hypertension: Secondary | ICD-10-CM | POA: Diagnosis not present

## 2019-10-04 DIAGNOSIS — M48062 Spinal stenosis, lumbar region with neurogenic claudication: Secondary | ICD-10-CM | POA: Diagnosis not present

## 2019-10-04 DIAGNOSIS — Z7982 Long term (current) use of aspirin: Secondary | ICD-10-CM | POA: Diagnosis not present

## 2019-10-04 DIAGNOSIS — Z471 Aftercare following joint replacement surgery: Secondary | ICD-10-CM | POA: Diagnosis not present

## 2019-10-04 DIAGNOSIS — R350 Frequency of micturition: Secondary | ICD-10-CM | POA: Diagnosis not present

## 2019-10-04 MED ORDER — HYDROMORPHONE HCL 2 MG PO TABS: 2.0000 mg | ORAL_TABLET | ORAL | 0 refills | Status: DC | PRN

## 2019-10-04 NOTE — Anesthesia Postprocedure Evaluation (Signed)
Anesthesia Post Note  Patient: Larry [name]  Procedure(s) Performed: LEFT TOTAL KNEE ARTHROPLASTY (Left Knee)     Anesthesia Post Evaluation  Last Vitals:  Vitals:   10/03/19 1330 10/03/19 1430  BP: (!) 130/92 128/90  Pulse: 86 86  Resp: 15 15  Temp: 36.7 C 36.7 C  SpO2: 100% 100%    Last Pain:  Vitals:   10/03/19 1430  TempSrc:   PainSc: 4                  Rodriques Badie

## 2019-10-04 NOTE — Care Plan (Signed)
RNCM spoke with patient this morning for his D/C Ortho bundle call. He states he has done well overnight and does not notice any additional bleeding from the knee after his Left TKA per Dr. Durward Fortes yesterday. He was discharged home same day from PACU at [facility] and then called back to PACU once home due to increased bleeding at surgical site. PACU staff instructed patient to return to [facility] ED, where they contacted Dr. Durward Fortes. Instructions to remove current dressing, clean wound and re-dress were given. Patient discharged home. Anticipate HHPT to begin today. Patient had questions about removing the knee immobilizer and medications. Reviewed with Dr. Durward Fortes, who did fax in a new prescription for Dilaudid and 325 mg ASA to patient's pharmacy. Patient can remove knee immobilizer, participate in therapy today and use CPM as directed. Will schedule an appointment with Dr. Durward Fortes in 1 week to look at dressing and address any issues. Patient instructed to call office with any other needs or concerns. Updated on new medications and not to start Meloxicam back at this time.

## 2019-10-04 NOTE — Telephone Encounter (Signed)
Ortho bundle D/C call completed. 

## 2019-10-05 ENCOUNTER — Telehealth: Payer: Self-pay | Admitting: *Deleted

## 2019-10-05 NOTE — Care Plan (Signed)
RNCM call to patient to check status today. He states he is doing well and having no further issues. Therapy came to his home yesterday and will be back tomorrow. He states there has been no further bleeding from his Left knee incision. Dressing is dry and intact. Reminded of post-op appointment with Dr. Durward Fortes next week on Tuesday.

## 2019-10-05 NOTE — Telephone Encounter (Signed)
Ortho bundle call . 

## 2019-10-06 ENCOUNTER — Other Ambulatory Visit: Payer: Self-pay | Admitting: Orthopaedic Surgery

## 2019-10-06 ENCOUNTER — Telehealth: Payer: Self-pay | Admitting: *Deleted

## 2019-10-06 DIAGNOSIS — M48062 Spinal stenosis, lumbar region with neurogenic claudication: Secondary | ICD-10-CM | POA: Diagnosis not present

## 2019-10-06 DIAGNOSIS — E039 Hypothyroidism, unspecified: Secondary | ICD-10-CM | POA: Diagnosis not present

## 2019-10-06 DIAGNOSIS — I10 Essential (primary) hypertension: Secondary | ICD-10-CM | POA: Diagnosis not present

## 2019-10-06 DIAGNOSIS — Z471 Aftercare following joint replacement surgery: Secondary | ICD-10-CM | POA: Diagnosis not present

## 2019-10-06 DIAGNOSIS — M5417 Radiculopathy, lumbosacral region: Secondary | ICD-10-CM | POA: Diagnosis not present

## 2019-10-06 DIAGNOSIS — M5136 Other intervertebral disc degeneration, lumbar region: Secondary | ICD-10-CM | POA: Diagnosis not present

## 2019-10-06 MED ORDER — HYDROMORPHONE HCL 2 MG PO TABS: 2.0000 mg | ORAL_TABLET | ORAL | 0 refills | Status: DC | PRN

## 2019-10-06 NOTE — Telephone Encounter (Signed)
Ortho bundle call completed. 

## 2019-10-06 NOTE — Telephone Encounter (Signed)
Sent in prescription 

## 2019-10-06 NOTE — Telephone Encounter (Signed)
Call from patient stating he is doing well with pain medication. Taking 1 tablet every 4 to sometimes 5 hours as needed. Will need more by Monday a.m. and requested if this can be called into his pharmacy, so when it is ready to be filled, they can.Thanks.

## 2019-10-09 DIAGNOSIS — M5136 Other intervertebral disc degeneration, lumbar region: Secondary | ICD-10-CM | POA: Diagnosis not present

## 2019-10-09 DIAGNOSIS — E039 Hypothyroidism, unspecified: Secondary | ICD-10-CM | POA: Diagnosis not present

## 2019-10-09 DIAGNOSIS — M5417 Radiculopathy, lumbosacral region: Secondary | ICD-10-CM | POA: Diagnosis not present

## 2019-10-09 DIAGNOSIS — M48062 Spinal stenosis, lumbar region with neurogenic claudication: Secondary | ICD-10-CM | POA: Diagnosis not present

## 2019-10-09 DIAGNOSIS — Z471 Aftercare following joint replacement surgery: Secondary | ICD-10-CM | POA: Diagnosis not present

## 2019-10-09 DIAGNOSIS — I10 Essential (primary) hypertension: Secondary | ICD-10-CM | POA: Diagnosis not present

## 2019-10-10 ENCOUNTER — Ambulatory Visit (INDEPENDENT_AMBULATORY_CARE_PROVIDER_SITE_OTHER): Payer: Medicare Other | Admitting: Orthopaedic Surgery

## 2019-10-10 ENCOUNTER — Other Ambulatory Visit: Payer: Self-pay

## 2019-10-10 ENCOUNTER — Encounter: Payer: Self-pay | Admitting: Orthopaedic Surgery

## 2019-10-10 DIAGNOSIS — Z96659 Presence of unspecified artificial knee joint: Secondary | ICD-10-CM | POA: Insufficient documentation

## 2019-10-10 DIAGNOSIS — Z96652 Presence of left artificial knee joint: Secondary | ICD-10-CM | POA: Diagnosis not present

## 2019-10-10 MED ORDER — LIDOCAINE HCL 1 % IJ SOLN
2.0000 mL | INTRAMUSCULAR | Status: AC | PRN
  Administered 2019-10-10: 2 mL

## 2019-10-10 NOTE — Progress Notes (Signed)
Office Visit Note   Patient: Larry [name]           Date of Birth: 1950/11/12           MRN: [id number] Visit Date: 10/10/2019              Requested by: Merrilee Seashore, [address] [place],  [place] [address] PCP: Merrilee Seashore, MD   Assessment & Plan: Visit Diagnoses:  1. S/P TKR (total knee replacement) using cement, left   2. Status post total left knee replacement     Plan: 1 week status post left total knee replacement doing well.  Change the dressing to Mepilex.  Weightbearing as tolerated with a walker.  Start outpatient therapy.  No pain pills in the last 24 hours.  Return in 1 week to remove stitches and and obtain new films  Follow-Up Instructions: Return in about 1 week (around 10/17/2019).   Orders:  No orders of the defined types were placed in this encounter.  No orders of the defined types were placed in this encounter.     Procedures: Large Joint Inj: L knee on 10/10/2019 1:22 PM Indications: pain and diagnostic evaluation Details: 25 G 1.5 in needle, anteromedial approach  Arthrogram: No  Medications: 2 mL lidocaine 1 % Aspirate: 35 mL bloody Outcome: tolerated well, no immediate complications Procedure, treatment alternatives, risks and benefits explained, specific risks discussed. Consent was given by the patient. Patient was prepped and draped in the usual sterile fashion.       Clinical Data: No additional findings.   Subjective: No chief complaint on file. 1 week status post primary left total knee replacement and doing quite well.  Independent with a walker.  Denies fever or chills.  No pain meds in 24 hours.  He was done as an outpatient.  He was soon as he returned home later in the day postoperatively he had some bleeding from his knee which has subsequently stopped.  Has developed some ecchymosis related to his aspirin.  HPI  Review of Systems   Objective: Vital Signs: There were no vitals taken  for this visit.  Physical Exam  Ortho Exam left knee incision healing without problem.  Cleaned with peroxide and Mepilex dressing applied.  No calf pain.  I did aspirate 35 cc of old blood.  Range of motion is 0 to about 98 degrees.  No instability.  No calf pain.  Neurologically intact.  He does have ecchymosis in his thigh and calf probably related to the aspirin  Specialty Comments:  No specialty comments available.  Imaging: No results found.   PMFS History: Patient Active Problem List   Diagnosis Date Noted  . S/P TKR (total knee replacement) 10/10/2019  . S/P TKR (total knee replacement) using cement, left 10/03/2019  . Cellulitis of arm 08/28/2019  . Essential hypertension 02/16/2018  . Sepsis due to skin infection ([place]) 02/16/2018  . Chronic pain 02/16/2018  . Cellulitis of left lower extremity   . Lumbar radiculopathy, chronic 01/24/2018  . Lumbar stenosis with neurogenic claudication 03/23/2017  . Hypothyroidism 07/25/2015  . Primary osteoarthritis of right knee 07/23/2015  . Primary osteoarthritis of knee 07/23/2015   Past Medical History:  Diagnosis Date  . Arthritis    "knees; left shoulder" (04/12/2013)  . Asthma    "as a child" (04/12/2013)  . DDD (degenerative disc disease), cervical   . DDD (degenerative disc disease), lumbar   . Difficult intubation    2012  CERVICAL FUSION   . Headache(784.0)    VERAPAMIL FOR PREVENTION   . 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 History of chicken pox    as an adult  . Hypoglycemia    last episode 1 yr. ago, just gets jittery  . Hypothyroidism   . Lumbar stenosis   . MRSA (methicillin resistant staph aureus) culture positive 09/10/2019   upper arm  . OSA (obstructive sleep apnea)    "haven't wore mask in years; had OR; still snore" (04/12/2013)  . Urinary frequency     History reviewed. No pertinent family history.  Past  Surgical History:  Procedure Laterality Date  . BACK SURGERY    . CERVICAL FUSION  2002; 2012  . CYSTECTOMY    . INGUINAL HERNIA REPAIR Bilateral 1997  . KNEE ARTHROSCOPY Left ~ 1997; ~ 2005  . LUMBAR FUSION  2007; 04/11/2013  . PENILE DEBRIDEMENT  ~ 2011   X 3  FOR MRSA INFECTION    . SHOULDER ARTHROSCOPY Left ~ 2003  . TONSILLECTOMY  1990's  . TOTAL KNEE ARTHROPLASTY Right 07/23/2015  . TOTAL KNEE ARTHROPLASTY Right 07/23/2015   Procedure: TOTAL KNEE ARTHROPLASTY;  Surgeon: Garald Balding, MD;  Location: [facility];  Service: Orthopedics;  Laterality: Right;  . TOTAL KNEE ARTHROPLASTY Left 10/03/2019   Procedure: LEFT TOTAL KNEE ARTHROPLASTY;  Surgeon: Garald Balding, MD;  Location: [facility];  Service: Orthopedics;  Laterality: Left;  . UVULOPALATOPHARYNGOPLASTY (UPPP)/TONSILLECTOMY/SEPTOPLASTY  1990's  . VASECTOMY     Social History   Occupational History  . Not on file  Tobacco Use  . Smoking status: Former Smoker    Packs/day: 0.50    Years: 15.00    Pack years: 7.50    Types: Cigarettes    Quit date: 10/14/1986    Years since quitting: 33.0  . Smokeless tobacco: Never Used  Substance and Sexual Activity  . Alcohol use: Yes    Comment: occasional  . Drug use: No  . Sexual activity: Yes     Garald Balding, MD   Note - This record has been created using [employer].  Chart creation errors have been sought, but may not always  have been located. Such creation errors do not reflect on  the standard of medical care.

## 2019-10-11 ENCOUNTER — Telehealth: Payer: Self-pay | Admitting: *Deleted

## 2019-10-11 DIAGNOSIS — M48062 Spinal stenosis, lumbar region with neurogenic claudication: Secondary | ICD-10-CM | POA: Diagnosis not present

## 2019-10-11 DIAGNOSIS — Z471 Aftercare following joint replacement surgery: Secondary | ICD-10-CM | POA: Diagnosis not present

## 2019-10-11 DIAGNOSIS — M5417 Radiculopathy, lumbosacral region: Secondary | ICD-10-CM | POA: Diagnosis not present

## 2019-10-11 DIAGNOSIS — E039 Hypothyroidism, unspecified: Secondary | ICD-10-CM | POA: Diagnosis not present

## 2019-10-11 DIAGNOSIS — I10 Essential (primary) hypertension: Secondary | ICD-10-CM | POA: Diagnosis not present

## 2019-10-11 DIAGNOSIS — M5136 Other intervertebral disc degeneration, lumbar region: Secondary | ICD-10-CM | POA: Diagnosis not present

## 2019-10-11 NOTE — Telephone Encounter (Signed)
Ortho bundle 7 day post-op call/in person meeting.

## 2019-10-13 DIAGNOSIS — R0600 Dyspnea, unspecified: Secondary | ICD-10-CM | POA: Diagnosis not present

## 2019-10-13 DIAGNOSIS — R5082 Postprocedural fever: Secondary | ICD-10-CM | POA: Diagnosis not present

## 2019-10-13 DIAGNOSIS — R5383 Other fatigue: Secondary | ICD-10-CM | POA: Diagnosis not present

## 2019-10-13 DIAGNOSIS — R0602 Shortness of breath: Secondary | ICD-10-CM | POA: Diagnosis not present

## 2019-10-13 DIAGNOSIS — R630 Anorexia: Secondary | ICD-10-CM | POA: Diagnosis not present

## 2019-10-14 DIAGNOSIS — I10 Essential (primary) hypertension: Secondary | ICD-10-CM | POA: Diagnosis not present

## 2019-10-14 DIAGNOSIS — M48062 Spinal stenosis, lumbar region with neurogenic claudication: Secondary | ICD-10-CM | POA: Diagnosis not present

## 2019-10-14 DIAGNOSIS — Z471 Aftercare following joint replacement surgery: Secondary | ICD-10-CM | POA: Diagnosis not present

## 2019-10-14 DIAGNOSIS — M5136 Other intervertebral disc degeneration, lumbar region: Secondary | ICD-10-CM | POA: Diagnosis not present

## 2019-10-14 DIAGNOSIS — M5417 Radiculopathy, lumbosacral region: Secondary | ICD-10-CM | POA: Diagnosis not present

## 2019-10-14 DIAGNOSIS — E039 Hypothyroidism, unspecified: Secondary | ICD-10-CM | POA: Diagnosis not present

## 2019-10-16 DIAGNOSIS — R630 Anorexia: Secondary | ICD-10-CM | POA: Diagnosis not present

## 2019-10-16 DIAGNOSIS — R5082 Postprocedural fever: Secondary | ICD-10-CM | POA: Diagnosis not present

## 2019-10-16 DIAGNOSIS — R0600 Dyspnea, unspecified: Secondary | ICD-10-CM | POA: Diagnosis not present

## 2019-10-16 DIAGNOSIS — R5383 Other fatigue: Secondary | ICD-10-CM | POA: Diagnosis not present

## 2019-10-16 DIAGNOSIS — F419 Anxiety disorder, unspecified: Secondary | ICD-10-CM | POA: Diagnosis not present

## 2019-10-17 ENCOUNTER — Ambulatory Visit (INDEPENDENT_AMBULATORY_CARE_PROVIDER_SITE_OTHER): Payer: Medicare Other | Admitting: Physical Therapy

## 2019-10-17 ENCOUNTER — Encounter: Payer: Self-pay | Admitting: Physical Therapy

## 2019-10-17 ENCOUNTER — Telehealth: Payer: Self-pay | Admitting: *Deleted

## 2019-10-17 ENCOUNTER — Ambulatory Visit (INDEPENDENT_AMBULATORY_CARE_PROVIDER_SITE_OTHER): Payer: Medicare Other

## 2019-10-17 ENCOUNTER — Encounter: Payer: Self-pay | Admitting: Orthopaedic Surgery

## 2019-10-17 ENCOUNTER — Ambulatory Visit (INDEPENDENT_AMBULATORY_CARE_PROVIDER_SITE_OTHER): Payer: Medicare Other | Admitting: Orthopaedic Surgery

## 2019-10-17 ENCOUNTER — Other Ambulatory Visit: Payer: Self-pay

## 2019-10-17 VITALS — Ht 66.0 in | Wt 165.0 lb

## 2019-10-17 DIAGNOSIS — M25662 Stiffness of left knee, not elsewhere classified: Secondary | ICD-10-CM

## 2019-10-17 DIAGNOSIS — Z96652 Presence of left artificial knee joint: Secondary | ICD-10-CM | POA: Diagnosis not present

## 2019-10-17 DIAGNOSIS — R600 Localized edema: Secondary | ICD-10-CM | POA: Diagnosis not present

## 2019-10-17 DIAGNOSIS — M25562 Pain in left knee: Secondary | ICD-10-CM | POA: Diagnosis not present

## 2019-10-17 DIAGNOSIS — M6281 Muscle weakness (generalized): Secondary | ICD-10-CM

## 2019-10-17 NOTE — Progress Notes (Signed)
Office Visit Note   Patient: Larry [name]           Date of Birth: 05-23-1951           MRN: [id number] Visit Date: 10/17/2019              Requested by: Merrilee Seashore, [address] [place] [place],  [place] [address] PCP: Merrilee Seashore, MD   Assessment & Plan: Visit Diagnoses:  1. Status post total left knee replacement   2. S/P TKR (total knee replacement) using cement, left     Plan: 2-week status post primary left total knee replacement and doing well.  Independent with a walker.  Receiving outpatient physical therapy.  Recently developed pneumonia and being treated by his primary care physician.  Left knee wound is healing without problem.  The staples were removed and Steri-Strips applied.  Range of motion is 0 to probably 100 degrees.  No instability.  Continue with weightbearing as tolerated progressing to a cane.  No work.  Office 1 month.  Taking very little pain pills  Follow-Up Instructions: Return in about 1 month (around 11/17/2019).   Orders:  Orders Placed This Encounter  Procedures  . XR KNEE 3 VIEW LEFT   No orders of the defined types were placed in this encounter.     Procedures: No procedures performed   Clinical Data: No additional findings.   Subjective: Chief Complaint  Patient presents with  . Left Knee - Follow-up    Left TKA DOS 10/03/2019  Patient presents today for follow up on his left knee. He had a left total knee arthroplasty done on 10/03/2019. Doing well. He was recently diagnosed with pneumonia and states that he isn't feeling so well due to that. Not taking anything for pain.  HPI  Review of Systems   Objective: Vital Signs: Ht 5\' 6"  (1.676 m)   Wt 165 lb (74.8 kg)   BMI 26.63 kg/m   Physical Exam  Ortho Exam awake alert and oriented x3.  Comfortable sitting.  Left total knee replacement incision healing without problem.  Clips removed and Steri-Strips applied.  Full extension.  No instability.   Flexed about 100 degrees.  No calf pain.  No distal edema.  Motor exam intact  Specialty Comments:  No specialty comments available.  Imaging: XR KNEE 3 VIEW LEFT  Result Date: 10/17/2019 Films of the left knee were obtained in 3 projections standing.  The total knee replacement is in excellent position without evidence of complication.  Nice glue mantle.  No significant ectopic calcification.    PMFS History: Patient Active Problem List   Diagnosis Date Noted  . S/P TKR (total knee replacement) 10/10/2019  . S/P TKR (total knee replacement) using cement, left 10/03/2019  . Cellulitis of arm 08/28/2019  . Essential hypertension 02/16/2018  . Sepsis due to skin infection ([place]) 02/16/2018  . Chronic pain 02/16/2018  . Cellulitis of left lower extremity   . Lumbar radiculopathy, chronic 01/24/2018  . Lumbar stenosis with neurogenic claudication 03/23/2017  . Hypothyroidism 07/25/2015  . Primary osteoarthritis of right knee 07/23/2015  . Primary osteoarthritis of knee 07/23/2015   Past Medical History:  Diagnosis Date  . Arthritis    "knees; left shoulder" (04/12/2013)  . Asthma    "as a child" (04/12/2013)  . DDD (degenerative disc disease), cervical   . DDD (degenerative disc disease), lumbar   . Difficult intubation    2012   CERVICAL FUSION   .  Headache(784.0)    VERAPAMIL FOR PREVENTION   . 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 History of chicken pox    as an adult  . Hypoglycemia    last episode 1 yr. ago, just gets jittery  . Hypothyroidism   . Lumbar stenosis   . MRSA (methicillin resistant staph aureus) culture positive 09/10/2019   upper arm  . OSA (obstructive sleep apnea)    "haven't wore mask in years; had OR; still snore" (04/12/2013)  . Urinary frequency     History reviewed. No pertinent family history.  Past Surgical History:  Procedure Laterality Date  . BACK  SURGERY    . CERVICAL FUSION  2002; 2012  . CYSTECTOMY    . INGUINAL HERNIA REPAIR Bilateral 1997  . KNEE ARTHROSCOPY Left ~ 1997; ~ 2005  . LUMBAR FUSION  2007; 04/11/2013  . PENILE DEBRIDEMENT  ~ 2011   X 3  FOR MRSA INFECTION    . SHOULDER ARTHROSCOPY Left ~ 2003  . TONSILLECTOMY  1990's  . TOTAL KNEE ARTHROPLASTY Right 07/23/2015  . TOTAL KNEE ARTHROPLASTY Right 07/23/2015   Procedure: TOTAL KNEE ARTHROPLASTY;  Surgeon: Garald Balding, MD;  Location: [facility];  Service: Orthopedics;  Laterality: Right;  . TOTAL KNEE ARTHROPLASTY Left 10/03/2019   Procedure: LEFT TOTAL KNEE ARTHROPLASTY;  Surgeon: Garald Balding, MD;  [facility];  Service: Orthopedics;  Laterality: Left;  . UVULOPALATOPHARYNGOPLASTY (UPPP)/TONSILLECTOMY/SEPTOPLASTY  1990's  . VASECTOMY     Social History   Occupational History  . Not on file  Tobacco Use  . Smoking status: Former Smoker    Packs/day: 0.50    Years: 15.00    Pack years: 7.50    Types: Cigarettes    Quit date: 10/14/1986    Years since quitting: 33.0  . Smokeless tobacco: Never Used  Substance and Sexual Activity  . Alcohol use: Yes    Comment: occasional  . Drug use: No  . Sexual activity: Yes

## 2019-10-17 NOTE — Therapy (Signed)
[facility] [address] [place], [place], [address] Phone: [phone]   Fax:  [phone]  Physical Therapy Evaluation  Patient Details  Name: Larry [name] MRN: [id number] Date of Birth: 11-Jul-1951 Referring Provider (PT): Dr. Joni Fears   Encounter Date: 10/17/2019  PT End of Session - 10/17/19 1640    Visit Number  1    Number of Visits  12    Date for PT Re-Evaluation  11/28/19    PT Start Time  [id number]    PT Stop Time  1518    PT Time Calculation (min)  33 min    Activity Tolerance  Patient tolerated treatment well    Behavior During Therapy  [facility] for tasks assessed/performed       Past Medical History:  Diagnosis Date  . Arthritis    "knees; left shoulder" (04/12/2013)  . Asthma    "as a child" (04/12/2013)  . DDD (degenerative disc disease), cervical   . DDD (degenerative disc disease), lumbar   . Difficult intubation    2012   CERVICAL FUSION   . Headache(784.0)    VERAPAMIL FOR PREVENTION   . 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 History of chicken pox    as an adult  . Hypoglycemia    last episode 1 yr. ago, just gets jittery  . Hypothyroidism   . Lumbar stenosis   . MRSA (methicillin resistant staph aureus) culture positive 09/10/2019   upper arm  . OSA (obstructive sleep apnea)    "haven't wore mask in years; had OR; still snore" (04/12/2013)  . Urinary frequency     Past Surgical History:  Procedure Laterality Date  . BACK SURGERY    . CERVICAL FUSION  2002; 2012  . CYSTECTOMY    . INGUINAL HERNIA REPAIR Bilateral 1997  . KNEE ARTHROSCOPY Left ~ 1997; ~ 2005  . LUMBAR FUSION  2007; 04/11/2013  . PENILE DEBRIDEMENT  ~ 2011   X 3  FOR MRSA INFECTION    . SHOULDER ARTHROSCOPY Left ~ 2003  . TONSILLECTOMY  1990's  . TOTAL KNEE ARTHROPLASTY Right 07/23/2015  . TOTAL KNEE ARTHROPLASTY Right 07/23/2015   Procedure:  TOTAL KNEE ARTHROPLASTY;  Surgeon: Garald Balding, MD;  Location: [facility];  Service: Orthopedics;  Laterality: Right;  . TOTAL KNEE ARTHROPLASTY Left 10/03/2019   Procedure: LEFT TOTAL KNEE ARTHROPLASTY;  Surgeon: Garald Balding, MD;  [facility];  Service: Orthopedics;  Laterality: Left;  . UVULOPALATOPHARYNGOPLASTY (UPPP)/TONSILLECTOMY/SEPTOPLASTY  1990's  . VASECTOMY      There were no vitals filed for this visit.   Subjective Assessment - 10/17/19 1446    Subjective  Pt is a 69 y/o male who presents to OPPT s/o Lt TKA on [id number], complicated by PNA.  Pt currently WBAT and amb with RW still at this time but would like to learn how to amb with cane.    Limitations  Standing;Walking    Patient Stated Goals  recover faster    Currently in Pain?  Yes    Pain Score  0-No pain   up to 10/10   Pain Location  Knee    Pain Orientation  Left    Pain Descriptors / Indicators  Sharp    Pain Type  Surgical pain;Acute pain    Pain Onset  1 to 4 weeks ago    Pain Frequency  Intermittent    Aggravating Factors   twisting motion with foot planted    Pain Relieving Factors  avoidance of provoking factors, rest         [facility] PT Assessment - 10/17/19 1450      Assessment   Medical Diagnosis  Lt TKA    Referring Provider (PT)  Dr. Joni Fears    Onset Date/Surgical Date  10/03/19    Hand Dominance  Right    Next MD Visit  11/14/19    Prior Therapy  HHPT x 2 wks      Precautions   Precautions  None      Restrictions   Weight Bearing Restrictions  No      Balance Screen   Has the patient fallen in the past 6 months  No    Has the patient had a decrease in activity level because of a fear of falling?   No    Is the patient reluctant to leave their home because of a fear of falling?   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 - walks 2-4 miles a day      Cognition   Overall Cognitive Status  Within Functional Limits for tasks assessed      Posture/Postural Control   Posture/Postural Control  Postural limitations    Postural Limitations  Rounded Shoulders;Forward head      ROM / Strength   AROM / PROM / Strength  AROM;PROM;Strength      AROM   AROM Assessment Site  Knee    Right/Left Knee  Right;Left    Right Knee Extension  0    Right Knee Flexion  117    Left Knee Extension  0    Left Knee Flexion  90      PROM   PROM Assessment Site  Knee    Right/Left Knee  Left    Left Knee Flexion  112      Strength   Overall Strength Comments  deficits noted-pt with ~ 10 degree quad lag; poor quad activation on Lt      Palpation   Palpation comment  mild swelling present Lt knee      Ambulation/Gait   Assistive device  Rolling walker    Gait Pattern  Decreased stance time - left;Decreased step length - right;Decreased hip/knee flexion - left                Objective measurements completed on examination: See above findings.      [place] Adult PT Treatment/Exercise - 10/17/19 1450      Exercises   Exercises  Knee/Hip      Knee/Hip Exercises: Standing   Gait Training  amb with SPC with supervision and cues for sequencing,  gait deviations still present      Knee/Hip Exercises: Supine   Quad Sets  Left;5 reps    Quad Sets Limitations  poor quad activation    Short Arc [employer]  Left;5 reps    Heel Slides  AAROM;Left;5 sets    Straight Leg Raises  Left;5 reps     Straight Leg Raises Limitations  quad lag present             PT Education - 10/17/19 1638    Education Details  HEP    Person(s) Educated  Patient    Methods  Explanation    Comprehension  Verbalized understanding;Returned demonstration;Need further instruction          PT Long Term Goals - 10/17/19 1643      PT LONG TERM GOAL #1   Title  independent with HEP    Status  New    [place] Date  11/28/19      PT LONG TERM GOAL #2   Title  improve Lt knee AROM 0-115 for improved function    Status  New    [place] Date  11/28/19      PT LONG TERM GOAL #3   Title  report ability to amb without AD at least 30 min without increase in pain for improved function and preparation for return to work    Status  New    [place] Date  11/28/19      PT LONG TERM GOAL #4   Title  negotiate stairs reciprocally with 1 handrail modified independent for improved function    Status  New    [place] Date  11/28/19      PT LONG TERM GOAL #5   Title  demonstrate improved strength by performing at least 10 reps of SLR without quad lag    Status  New    [place] Date  11/28/19             Plan - 10/17/19 1639    Clinical Impression Statement  Pt is a 69 y/o male who presents to OPPT s/p Lt TKA.  Pt demonstrates decreased ROM and strength, as well as gait abnormalities and pain affecting functional mobility.  Pt will benefit from PT to address deficits listed.    Personal Factors and Comorbidities  Comorbidity 3+;Transportation    Comorbidities  hypothyroidism, OA, asthma, back pain s/p lumbar fusion and cervical fusion    Examination-Activity Limitations  Locomotion Level;Transfers;Squat;Stairs;Stand    Examination-Participation Restrictions  Other   occupation   Stability/Clinical Decision Making  Stable/Uncomplicated    Clinical Decision Making  Low    Rehab Potential  Good    PT Frequency  2x / week    PT Duration  6 weeks    PT Treatment/Interventions  ADLs/Self Care Home  Management;Cryotherapy;Electrical Stimulation;DME Instruction;Moist Heat;Gait training;Stair training;Functional mobility training;Patient/family education;Therapeutic activities;Therapeutic exercise;Balance training;Neuromuscular re-education;Manual techniques;Scar mobilization;Vasopneumatic Device;Taping;Passive range of motion;Dry needling    PT Next Visit Plan  review HEP, work on flexion/maintain extension, progress strengthening exercises, manual/modalities    PT Home Exercise Plan  Access Code: [email or web address]    Consulted and Agree with Plan of Care  Patient       Patient will benefit from skilled therapeutic intervention in order to improve the following deficits and impairments:  Abnormal gait, Pain, Decreased mobility, Decreased range of motion, Decreased balance, Difficulty walking, Increased edema, Decreased strength, Impaired flexibility  Visit Diagnosis: Acute pain of left knee - Plan: PT plan of care cert/re-cert  Stiffness of left knee, not elsewhere classified - Plan: PT plan of care cert/re-cert  Muscle weakness (generalized) - Plan: PT plan of care cert/re-cert  Localized edema - Plan: PT plan of care cert/re-cert     Problem List Patient Active Problem List   Diagnosis Date Noted  . S/P TKR (total knee replacement) 10/10/2019  . S/P TKR (total knee replacement) using cement, left 10/03/2019  . Cellulitis of arm 08/28/2019  . Essential hypertension 02/16/2018  . Sepsis due to skin infection ([place]) 02/16/2018  . Chronic pain 02/16/2018  . Cellulitis of left lower extremity   . Lumbar radiculopathy, chronic 01/24/2018  . Lumbar stenosis with neurogenic claudication 03/23/2017  . Hypothyroidism 07/25/2015  . Primary osteoarthritis of right knee 07/23/2015  . Primary osteoarthritis of knee 07/23/2015      Laureen Abrahams, PT, DPT 10/17/19 4:49 PM     [facility] [address] [place], [place], [address] Phone:  [phone]   Fax:  [phone]  Name: Larry [name] MRN: [id number] Date of Birth: Jan 30, 1951

## 2019-10-17 NOTE — Care Plan (Signed)
RNCM met with patient in office today for his 2 week post-op with Dr. Durward Fortes. He states he was seen last week by his PCP due to a cough and low grade fever one day. He was seen Thursday and did have a chest X-ray, but no Covid test was done. He was positive for pneumonia and started on an antibiotic. The knee itself is doing well. Staples removed today by Dr. Durward Fortes. Patient scheduled to begin OPPT today. F/U with Dr. Durward Fortes in 4 weeks. Reminded to contact CM for any other needs.

## 2019-10-17 NOTE — Telephone Encounter (Signed)
14 day Ortho bundle call completed.  

## 2019-10-17 NOTE — Patient Instructions (Signed)
Access Code: [email or web address]  URL: [email or web address]  Date: 10/17/2019  Prepared by: Faustino Congress   Exercises Supine Quadricep Sets - 15 reps - 1 sets - 3-5x daily - 7x weekly Supine Short Arc Quad - 15 reps - 1 sets - 3-5x daily - 7x weekly Supine Active Straight Leg Raise - 15 reps - 1 sets - 3-5x daily - 7x weekly Supine Heel Slide with Strap - 10 reps - 1 sets - 3-5x daily - 7x weekly Seated Knee Flexion Stretch - 15 reps - 1 sets - 3-5x daily - 7x weekly

## 2019-10-18 ENCOUNTER — Encounter: Payer: Self-pay | Admitting: Physical Therapy

## 2019-10-18 ENCOUNTER — Ambulatory Visit (INDEPENDENT_AMBULATORY_CARE_PROVIDER_SITE_OTHER): Payer: Medicare Other | Admitting: Physical Therapy

## 2019-10-18 DIAGNOSIS — M25562 Pain in left knee: Secondary | ICD-10-CM | POA: Diagnosis not present

## 2019-10-18 DIAGNOSIS — M6281 Muscle weakness (generalized): Secondary | ICD-10-CM | POA: Diagnosis not present

## 2019-10-18 DIAGNOSIS — M25662 Stiffness of left knee, not elsewhere classified: Secondary | ICD-10-CM

## 2019-10-18 DIAGNOSIS — R600 Localized edema: Secondary | ICD-10-CM

## 2019-10-18 NOTE — Therapy (Signed)
[facility] Physical Therapy [address] [place], [place], [address] Phone: [phone]   Fax:  [phone]  Physical Therapy Treatment  Patient Details  Name: Larry [name] MRN: [id number] Date of Birth: [birth year] Referring Provider (PT): Dr. Joni Fears   Encounter Date: 10/18/2019  PT End of Session - 10/18/19 1349    Visit Number  2    Number of Visits  12    Date for PT Re-Evaluation  11/28/19    PT Start Time  1312    PT Stop Time  1357    PT Time Calculation (min)  45 min    Activity Tolerance  Patient tolerated treatment well    Behavior During Therapy  [facility] for tasks assessed/performed       Past Medical History:  Diagnosis Date  . Arthritis    "knees; left shoulder" (04/12/2013)  . Asthma    "as a child" (04/12/2013)  . DDD (degenerative disc disease), cervical   . DDD (degenerative disc disease), lumbar   . Difficult intubation    2012   CERVICAL FUSION   . Headache(784.0)    VERAPAMIL FOR PREVENTION   . 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 History of chicken pox    as an adult  . Hypoglycemia    last episode 1 yr. ago, just gets jittery  . Hypothyroidism   . Lumbar stenosis   . MRSA (methicillin resistant staph aureus) culture positive 09/10/2019   upper arm  . OSA (obstructive sleep apnea)    "haven't wore mask in years; had OR; still snore" (04/12/2013)  . Urinary frequency     Past Surgical History:  Procedure Laterality Date  . BACK SURGERY    . CERVICAL FUSION  2002; 2012  . CYSTECTOMY    . INGUINAL HERNIA REPAIR Bilateral 1997  . KNEE ARTHROSCOPY Left ~ 1997; ~ 2005  . LUMBAR FUSION  2007; 04/11/2013  . PENILE DEBRIDEMENT  ~ 2011   X 3  FOR MRSA INFECTION    . SHOULDER ARTHROSCOPY Left ~ 2003  . TONSILLECTOMY  1990's  . TOTAL KNEE ARTHROPLASTY Right 07/23/2015  . TOTAL KNEE ARTHROPLASTY Right 07/23/2015   Procedure:  TOTAL KNEE ARTHROPLASTY;  Surgeon: Garald Balding, MD;  Location: [place];  Service: Orthopedics;  Laterality: Right;  . TOTAL KNEE ARTHROPLASTY Left 10/03/2019   Procedure: LEFT TOTAL KNEE ARTHROPLASTY;  Surgeon: Garald Balding, MD;  [facility];  Service: Orthopedics;  Laterality: Left;  . UVULOPALATOPHARYNGOPLASTY (UPPP)/TONSILLECTOMY/SEPTOPLASTY  1990's  . VASECTOMY      There were no vitals filed for this visit.  Subjective Assessment - 10/18/19 1312    Subjective  knee was pretty sore last night after passive measurement yesterday.  feels better today, using cane today.    Limitations  Standing;Walking    Patient Stated Goals  recover faster    Currently in Pain?  Yes    Pain Score  5     Pain Location  Knee    Pain Orientation  Left    Pain Descriptors / Indicators  Aching;Dull    Pain Type  Surgical pain;Acute pain    Pain Onset  1 to 4 weeks ago    Pain Frequency  Intermittent         [facility] PT Assessment - 10/18/19 1322      Assessment   Medical Diagnosis  Lt  TKA    Referring Provider (PT)  Dr. Joni Fears    Onset Date/Surgical Date  10/03/19    Next MD Visit  11/14/19                   [facility] Adult PT Treatment/Exercise - 10/18/19 1316      Knee/Hip Exercises: Aerobic   Recumbent Bike  partial revolutions x 8 min; seat 6      Knee/Hip Exercises: Supine   Quad Sets  Left;20 reps    Short Arc [employer]  Left;20 reps    Short Arc Quad Sets Limitations  5 sec hold    Heel Slides  AAROM;Left;10 reps    Other Supine Knee/Hip Exercises  hamstring curl on red physioball with PT overpressure x 10 repr for knee flexion      Modalities   Modalities  Vasopneumatic      Vasopneumatic   Number Minutes Vasopneumatic   10 minutes    Vasopnuematic Location   Shoulder    Vasopneumatic Pressure  Medium    Vasopneumatic Temperature   34      Manual Therapy   Manual Therapy  Passive ROM    Passive ROM  gently Lt knee flexion/extension - limited  tolerance to manual therapy                  PT Long Term Goals - 10/17/19 1643      PT LONG TERM GOAL #1   Title  independent with HEP    Status  New    Target Date  11/28/19      PT LONG TERM GOAL #2   Title  improve Lt knee AROM 0-115 for improved function    Status  New    Target Date  11/28/19      PT LONG TERM GOAL #3   Title  report ability to amb without AD at least 30 min without increase in pain for improved function and preparation for return to work    Status  New    Target Date  11/28/19      PT LONG TERM GOAL #4   Title  negotiate stairs reciprocally with 1 handrail modified independent for improved function    Status  New    Target Date  11/28/19      PT LONG TERM GOAL #5   Title  demonstrate improved strength by performing at least 10 reps of SLR without quad lag    Status  New    Target Date  11/28/19            Plan - 10/18/19 1349    Clinical Impression Statement  Pt tolerated session well today, and does have some limited tolerance to PROM.  Ball roll for ROM better tolerated today.  Will continue to benefit from PT to maximize function.  No goals met as only 2nd visit.    Personal Factors and Comorbidities  Comorbidity 3+;Transportation    Comorbidities  hypothyroidism, OA, asthma, back pain s/p lumbar fusion and cervical fusion    Examination-Activity Limitations  Locomotion Level;Transfers;Squat;Stairs;Stand    Examination-Participation Restrictions  Other   occupation   Stability/Clinical Decision Making  Stable/Uncomplicated    Rehab Potential  Good    PT Frequency  2x / week    PT Duration  6 weeks    PT Treatment/Interventions  ADLs/Self Care Home Management;Cryotherapy;Electrical Stimulation;DME Instruction;Moist Heat;Gait training;Stair training;Functional mobility training;Patient/family education;Therapeutic activities;Therapeutic exercise;Balance training;Neuromuscular re-education;Manual techniques;Scar  mobilization;Vasopneumatic Device;Taping;Passive range  of motion;Dry needling    PT Next Visit Plan  review HEP, work on flexion/maintain extension, progress strengthening exercises, manual/modalities    PT Home Exercise Plan  Access Code: [email or web address]    Consulted and Agree with Plan of Care  Patient       Patient will benefit from skilled therapeutic intervention in order to improve the following deficits and impairments:  Abnormal gait, Pain, Decreased mobility, Decreased range of motion, Decreased balance, Difficulty walking, Increased edema, Decreased strength, Impaired flexibility  Visit Diagnosis: Acute pain of left knee  Stiffness of left knee, not elsewhere classified  Muscle weakness (generalized)  Localized edema     Problem List Patient Active Problem List   Diagnosis Date Noted  . S/P TKR (total knee replacement) 10/10/2019  . S/P TKR (total knee replacement) using cement, left 10/03/2019  . Cellulitis of arm 08/28/2019  . Essential hypertension 02/16/2018  . Sepsis due to skin infection ([place]) 02/16/2018  . Chronic pain 02/16/2018  . Cellulitis of left lower extremity   . Lumbar radiculopathy, chronic 01/24/2018  . Lumbar stenosis with neurogenic claudication 03/23/2017  . Hypothyroidism 07/25/2015  . Primary osteoarthritis of right knee 07/23/2015  . Primary osteoarthritis of knee 07/23/2015     Laureen Abrahams, PT, DPT 10/18/19 1:51 PM     [facility] Physical Therapy [address] [place], [place], [address] Phone: [phone]   Fax:  [phone]  Name: Larry [name] MRN: [id number] Date of Birth: Mar 09, 1951

## 2019-10-23 DIAGNOSIS — R0600 Dyspnea, unspecified: Secondary | ICD-10-CM | POA: Diagnosis not present

## 2019-10-23 DIAGNOSIS — F419 Anxiety disorder, unspecified: Secondary | ICD-10-CM | POA: Diagnosis not present

## 2019-10-23 DIAGNOSIS — R5383 Other fatigue: Secondary | ICD-10-CM | POA: Diagnosis not present

## 2019-10-23 DIAGNOSIS — R5082 Postprocedural fever: Secondary | ICD-10-CM | POA: Diagnosis not present

## 2019-10-23 DIAGNOSIS — R630 Anorexia: Secondary | ICD-10-CM | POA: Diagnosis not present

## 2019-10-27 ENCOUNTER — Other Ambulatory Visit: Payer: Self-pay

## 2019-10-27 ENCOUNTER — Ambulatory Visit (INDEPENDENT_AMBULATORY_CARE_PROVIDER_SITE_OTHER): Payer: Medicare Other | Admitting: Physical Therapy

## 2019-10-27 DIAGNOSIS — M25562 Pain in left knee: Secondary | ICD-10-CM | POA: Diagnosis not present

## 2019-10-27 DIAGNOSIS — M25662 Stiffness of left knee, not elsewhere classified: Secondary | ICD-10-CM | POA: Diagnosis not present

## 2019-10-27 DIAGNOSIS — M6281 Muscle weakness (generalized): Secondary | ICD-10-CM | POA: Diagnosis not present

## 2019-10-27 DIAGNOSIS — R600 Localized edema: Secondary | ICD-10-CM | POA: Diagnosis not present

## 2019-10-27 NOTE — Therapy (Signed)
[facility] Physical Therapy [address] [place], [place], [address] Phone: [phone]   Fax:  [phone]  Physical Therapy Treatment  Patient Details  Name: Larry [name] MRN: [id number] Date of Birth: 07-17-1951 Referring Provider (PT): Dr. Joni Fears   Encounter Date: 10/27/2019  PT End of Session - 10/27/19 1013    Visit Number  3    Number of Visits  12    Date for PT Re-Evaluation  11/28/19    Authorization Type  MCR, BCBS    PT Start Time  0920    PT Stop Time  1010    PT Time Calculation (min)  50 min    Activity Tolerance  Patient tolerated treatment well    Behavior During Therapy  [facility] for tasks assessed/performed       Past Medical History:  Diagnosis Date  . Arthritis    "knees; left shoulder" (04/12/2013)  . Asthma    "as a child" (04/12/2013)  . DDD (degenerative disc disease), cervical   . DDD (degenerative disc disease), lumbar   . Difficult intubation    2012   CERVICAL FUSION   . Headache(784.0)    VERAPAMIL FOR PREVENTION   . 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 History of chicken pox    as an adult  . Hypoglycemia    last episode 1 yr. ago, just gets jittery  . Hypothyroidism   . Lumbar stenosis   . MRSA (methicillin resistant staph aureus) culture positive 09/10/2019   upper arm  . OSA (obstructive sleep apnea)    "haven't wore mask in years; had OR; still snore" (04/12/2013)  . Urinary frequency     Past Surgical History:  Procedure Laterality Date  . BACK SURGERY    . CERVICAL FUSION  2002; 2012  . CYSTECTOMY    . INGUINAL HERNIA REPAIR Bilateral 1997  . KNEE ARTHROSCOPY Left ~ 1997; ~ 2005  . LUMBAR FUSION  2007; 04/11/2013  . PENILE DEBRIDEMENT  ~ 2011   X 3  FOR MRSA INFECTION    . SHOULDER ARTHROSCOPY Left ~ 2003  . TONSILLECTOMY  1990's  . TOTAL KNEE ARTHROPLASTY Right 07/23/2015  . TOTAL KNEE  ARTHROPLASTY Right 07/23/2015   Procedure: TOTAL KNEE ARTHROPLASTY;  Surgeon: Garald Balding, MD;  Location: [place];  Service: Orthopedics;  Laterality: Right;  . TOTAL KNEE ARTHROPLASTY Left 10/03/2019   Procedure: LEFT TOTAL KNEE ARTHROPLASTY;  Surgeon: Garald Balding, MD;  [facility];  Service: Orthopedics;  Laterality: Left;  . UVULOPALATOPHARYNGOPLASTY (UPPP)/TONSILLECTOMY/SEPTOPLASTY  1990's  . VASECTOMY      There were no vitals filed for this visit.  Subjective Assessment - 10/27/19 0939    Subjective  Lt knee is sore and uncomfortable but not pain    Limitations  Standing;Walking    Patient Stated Goals  recover faster    Currently in Pain?  No/denies    Pain Onset  1 to 4 weeks ago         [facility] PT Assessment - 10/27/19 0001      Assessment   Medical Diagnosis  Lt TKA    Referring Provider (PT)  Dr. Joni Fears    Onset Date/Surgical Date  10/03/19      ROM / Strength   AROM / PROM / Strength  AROM      AROM   Left Knee Extension  0    Left Knee Flexion  114   AAROM with strap        [facility] Adult PT Treatment/Exercise - 10/27/19 0001      Ambulation/Gait   Ambulation/Gait  Yes    Ambulation Distance (Feet)  150 Feet   X2   Gait Comments  now ambulating no AD with slight limp due to lack of ROM      Knee/Hip Exercises: Stretches   Active Hamstring Stretch  Left;2 reps;30 seconds    Quad Stretch  Left;2 reps;30 seconds    Quad Stretch Limitations  supine with strap, leg off EOB    Other Knee/Hip Stretches  heelslides AAROM 10 sec X 10 reps      Knee/Hip Exercises: Aerobic   Recumbent Bike  full revolutions 8 min seat 6      Knee/Hip Exercises: Machines for Strengthening   Total Gym Leg Press  shuttle leg press 75 lbs for bilat leg press X 20 reps, then dropped down to 37 lbs for Lt leg only push 2X10 reps      Knee/Hip Exercises: Standing   Heel Raises Limitations  heel toe raises bilat X 15 ea    Knee Flexion  Left;2 sets;10 reps     Knee Flexion Limitations  1 lb    Forward Step Up  Left;10 reps;Hand Hold: 1;Step Height: 6"      Knee/Hip Exercises: Seated   Long Arc Quad  Left;2 sets;10 reps    Long Arc Quad Weight  1 lbs.    Sit to [employer]  10 reps;without UE support      Knee/Hip Exercises: Supine   Short Arc Quad Sets  Left;2 sets;10 reps    Short Arc Quad Sets Limitations  1 lb    Straight Leg Raises  Left;15 reps      Knee/Hip Exercises: Sidelying   Hip ABduction  Left;15 reps      Modalities   Modalities  --   pt declined     Manual Therapy   Passive ROM  Lt knee PROM into flexion and extension         PT Long Term Goals - 10/17/19 1643      PT LONG TERM GOAL #1   Title  independent with HEP    Status  New    Target Date  11/28/19      PT LONG TERM GOAL #2   Title  improve Lt knee AROM 0-115 for improved function    Status  New    Target Date  11/28/19      PT LONG TERM GOAL #3   Title  report ability to amb without AD at least 30 min without increase in pain for improved function and preparation for return to work    Status  New    Target Date  11/28/19      PT LONG TERM GOAL #4   Title  negotiate stairs reciprocally with 1 handrail modified independent for improved function    Status  New    Target Date  11/28/19      PT LONG TERM GOAL #5   Title  demonstrate improved strength by performing at least 10 reps of SLR without quad lag    Status  New    Target Date  11/28/19            Plan - 10/27/19 1014    Clinical Impression Statement  He is making excellent progress with ambulaiton, strenght, and ROM.  He was able to progress his program today without complaints. PT will continue to progress as able toward his functional goals.    Personal Factors and Comorbidities  Comorbidity 3+;Transportation    Comorbidities  hypothyroidism, OA, asthma, back pain s/p lumbar fusion and cervical fusion    Examination-Activity Limitations  Locomotion Level;Transfers;Squat;Stairs;Stand     Examination-Participation Restrictions  Other   occupation   Stability/Clinical Decision Making  Stable/Uncomplicated    Rehab Potential  Good    PT Frequency  2x / week    PT Duration  6 weeks    PT Treatment/Interventions  ADLs/Self Care Home Management;Cryotherapy;Electrical Stimulation;DME Instruction;Moist Heat;Gait training;Stair training;Functional mobility training;Patient/family education;Therapeutic activities;Therapeutic exercise;Balance training;Neuromuscular re-education;Manual techniques;Scar mobilization;Vasopneumatic Device;Taping;Passive range of motion;Dry needling    PT Next Visit Plan  update HEP PRN, work on flexion/maintain extension, progress strengthening exercises, manual/modalities    PT Home Exercise Plan  Access Code: [email or web address]    Consulted and Agree with Plan of Care  Patient       Patient will benefit from skilled therapeutic intervention in order to improve the following deficits and impairments:  Abnormal gait, Pain, Decreased mobility, Decreased range of motion, Decreased balance, Difficulty walking, Increased edema, Decreased strength, Impaired flexibility  Visit Diagnosis: Acute pain of left knee  Stiffness of left knee, not elsewhere classified  Muscle weakness (generalized)  Localized edema     Problem List Patient Active Problem List   Diagnosis Date Noted  . S/P TKR (total knee replacement) 10/10/2019  . S/P TKR (total knee replacement) using cement, left 10/03/2019  . Cellulitis of arm 08/28/2019  . Essential hypertension 02/16/2018  . Sepsis due to skin infection ([place]) 02/16/2018  . Chronic pain 02/16/2018  . Cellulitis of left lower extremity   . Lumbar radiculopathy, chronic 01/24/2018  . Lumbar stenosis with neurogenic claudication 03/23/2017  . Hypothyroidism 07/25/2015  . Primary osteoarthritis of right knee 07/23/2015  . Primary osteoarthritis of knee 07/23/2015    Silvestre Mesi 10/27/2019, 10:15 AM  [facility] Physical Therapy [address] [place], [place], [address] Phone: [phone]   Fax:  [phone]  Name: Larry [name] MRN: [id number] Date of Birth: [birth year]

## 2019-10-30 ENCOUNTER — Ambulatory Visit (INDEPENDENT_AMBULATORY_CARE_PROVIDER_SITE_OTHER): Payer: Medicare Other | Admitting: Physical Therapy

## 2019-10-30 ENCOUNTER — Other Ambulatory Visit: Payer: Self-pay

## 2019-10-30 VITALS — BP 151/94 | HR 83

## 2019-10-30 DIAGNOSIS — M25662 Stiffness of left knee, not elsewhere classified: Secondary | ICD-10-CM | POA: Diagnosis not present

## 2019-10-30 DIAGNOSIS — R600 Localized edema: Secondary | ICD-10-CM

## 2019-10-30 DIAGNOSIS — M6281 Muscle weakness (generalized): Secondary | ICD-10-CM

## 2019-10-30 DIAGNOSIS — M25562 Pain in left knee: Secondary | ICD-10-CM

## 2019-10-30 NOTE — Therapy (Signed)
[facility] Physical Therapy [address] [place], [place], [address] Phone: [phone]   Fax:  [phone]  Physical Therapy Treatment  Patient Details  Name: Larry [name] MRN: [id number] Date of Birth: 10-31-1950 Referring Provider (PT): Dr. Joni Fears   Encounter Date: 10/30/2019  PT End of Session - 10/30/19 1108    Visit Number  4    Number of Visits  12    Date for PT Re-Evaluation  11/28/19    Authorization Type  MCR, BCBS    PT Start Time  0927    PT Stop Time  1015    PT Time Calculation (min)  48 min    Activity Tolerance  Patient tolerated treatment well    Behavior During Therapy  [facility] for tasks assessed/performed       Past Medical History:  Diagnosis Date  . Arthritis    "knees; left shoulder" (04/12/2013)  . Asthma    "as a child" (04/12/2013)  . DDD (degenerative disc disease), cervical   . DDD (degenerative disc disease), lumbar   . Difficult intubation    2012   CERVICAL FUSION   . Headache(784.0)    VERAPAMIL FOR PREVENTION   . 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 History of chicken pox    as an adult  . Hypoglycemia    last episode 1 yr. ago, just gets jittery  . Hypothyroidism   . Lumbar stenosis   . MRSA (methicillin resistant staph aureus) culture positive 09/10/2019   upper arm  . OSA (obstructive sleep apnea)    "haven't wore mask in years; had OR; still snore" (04/12/2013)  . Urinary frequency     Past Surgical History:  Procedure Laterality Date  . BACK SURGERY    . CERVICAL FUSION  2002; 2012  . CYSTECTOMY    . INGUINAL HERNIA REPAIR Bilateral 1997  . KNEE ARTHROSCOPY Left ~ 1997; ~ 2005  . LUMBAR FUSION  2007; 04/11/2013  . PENILE DEBRIDEMENT  ~ 2011   X 3  FOR MRSA INFECTION    . SHOULDER ARTHROSCOPY Left ~ 2003  . TONSILLECTOMY  1990's  . TOTAL KNEE ARTHROPLASTY Right 07/23/2015  . TOTAL KNEE  ARTHROPLASTY Right 07/23/2015   Procedure: TOTAL KNEE ARTHROPLASTY;  Surgeon: Garald Balding, MD;  Location: [place];  Service: Orthopedics;  Laterality: Right;  . TOTAL KNEE ARTHROPLASTY Left 10/03/2019   Procedure: LEFT TOTAL KNEE ARTHROPLASTY;  Surgeon: Garald Balding, MD;  [facility];  Service: Orthopedics;  Laterality: Left;  . UVULOPALATOPHARYNGOPLASTY (UPPP)/TONSILLECTOMY/SEPTOPLASTY  1990's  . VASECTOMY      Vitals:   10/30/19 0933 10/30/19 0938  BP: (!) 159/94 (!) 151/94  Pulse:  83  SpO2:  98%    Subjective Assessment - 10/30/19 0938    Subjective  had a lot of swelling took fluid pill and lost 7 lbs of fluid so requests to have his BP checked. He denies being symptomatic for blood pressue changes. He has 2-3/10 overall pain for his Lt knee         [facility] PT Assessment - 10/30/19 0001      Assessment   Medical Diagnosis  Lt TKA    Referring Provider (PT)  Dr. Joni Fears      AROM   Left Knee Extension  0    Left Knee Flexion  120   AAROM with strap  [place] Adult PT Treatment/Exercise - 10/30/19 0001      Ambulation/Gait   Ambulation/Gait  Yes    Ambulation Distance (Feet)  150 Feet   X2   Gait Comments  now ambulating no AD with slight limp      Knee/Hip Exercises: Stretches   Active Hamstring Stretch  Left;2 reps;30 seconds    Quad Stretch  Left;2 reps;30 seconds    Quad Stretch Limitations  supine with strap, leg off EOB    Other Knee/Hip Stretches  heelslides AAROM 10 sec X 10 reps      Knee/Hip Exercises: Aerobic   Recumbent Bike  full revolutions 6 min seat 6      Knee/Hip Exercises: Machines for Strengthening   Total Gym Leg Press  shuttle leg press 75 lbs for bilat leg press X 20 reps 37 lbs for Lt leg only push      Knee/Hip Exercises: Standing   Heel Raises Limitations  heel toe raises bilat X 20 ea    Knee Flexion  Left;2 sets;10 reps    Knee Flexion Limitations  1 lb    Forward Step Up  Left;10  reps;Step Height: 6";Hand Hold: 0    SLS  Lt 10 sec hold X 5 reps    Other Standing Knee Exercises  march walking at counter X 3 reps      Knee/Hip Exercises: Seated   Long Arc Quad  Left;2 sets;10 reps    Long Arc Quad Weight  2 lbs.    Sit to [employer]  without UE support;15 reps      Knee/Hip Exercises: Supine   Quad Sets  Left;15 reps    Quad Sets Limitations  with heel prop    Short Arc Quad Sets  Left;2 sets;10 reps    Short Arc Quad Sets Limitations  2 lb    Straight Leg Raises  Left;15 reps    Straight Leg Raises Limitations  2 lb      Knee/Hip Exercises: Sidelying   Hip ABduction  Left;15 reps    Hip ABduction Limitations  2 lbs      Modalities   Modalities  --   pt declined     Vasopneumatic   Number Minutes Vasopneumatic   5 minutes   5 minutes at his request   Vasopnuematic Location   Knee    Vasopneumatic Pressure  Medium    Vasopneumatic Temperature   34      Manual Therapy   Passive ROM  --                  PT Long Term Goals - 10/17/19 1643      PT LONG TERM GOAL #1   Title  independent with HEP    Status  New    Target Date  11/28/19      PT LONG TERM GOAL #2   Title  improve Lt knee AROM 0-115 for improved function    Status  New    Target Date  11/28/19      PT LONG TERM GOAL #3   Title  report ability to amb without AD at least 30 min without increase in pain for improved function and preparation for return to work    Status  New    Target Date  11/28/19      PT LONG TERM GOAL #4   Title  negotiate stairs reciprocally with 1 handrail modified independent for improved function    Status  New  Target Date  11/28/19      PT LONG TERM GOAL #5   Title  demonstrate improved strength by performing at least 10 reps of SLR without quad lag    Status  New    Target Date  11/28/19            Plan - 10/30/19 1108    Clinical Impression Statement  Some high blood pressure today but was asymptomatic during session without any signs  of distress. Able to increased resistance today with strengthening program without complaints. He is is progressings as expected toward his goals and now has [facility] knee ROM 0-120 deg. He does still have some edema around his knee so was again treated with vaso.    Personal Factors and Comorbidities  Comorbidity 3+;Transportation    Comorbidities  hypothyroidism, OA, asthma, back pain s/p lumbar fusion and cervical fusion    Examination-Activity Limitations  Locomotion Level;Transfers;Squat;Stairs;Stand    Examination-Participation Restrictions  Other   occupation   Stability/Clinical Decision Making  Stable/Uncomplicated    Rehab Potential  Good    PT Frequency  2x / week    PT Duration  6 weeks    PT Treatment/Interventions  ADLs/Self Care Home Management;Cryotherapy;Electrical Stimulation;DME Instruction;Moist Heat;Gait training;Stair training;Functional mobility training;Patient/family education;Therapeutic activities;Therapeutic exercise;Balance training;Neuromuscular re-education;Manual techniques;Scar mobilization;Vasopneumatic Device;Taping;Passive range of motion;Dry needling    PT Next Visit Plan  update HEP PRN, work on flexion/maintain extension, progress strengthening exercises, manual/modalities    PT Home Exercise Plan  Access Code: [email or web address]    Consulted and Agree with Plan of Care  Patient       Patient will benefit from skilled therapeutic intervention in order to improve the following deficits and impairments:  Abnormal gait, Pain, Decreased mobility, Decreased range of motion, Decreased balance, Difficulty walking, Increased edema, Decreased strength, Impaired flexibility  Visit Diagnosis: Acute pain of left knee  Stiffness of left knee, not elsewhere classified  Muscle weakness (generalized)  Localized edema     Problem List Patient Active Problem List   Diagnosis Date Noted  . S/P TKR (total knee replacement) 10/10/2019  . S/P TKR (total knee replacement) using  cement, left 10/03/2019  . Cellulitis of arm 08/28/2019  . Essential hypertension 02/16/2018  . Sepsis due to skin infection ([place]) 02/16/2018  . Chronic pain 02/16/2018  . Cellulitis of left lower extremity   . Lumbar radiculopathy, chronic 01/24/2018  . Lumbar stenosis with neurogenic claudication 03/23/2017  . Hypothyroidism 07/25/2015  . Primary osteoarthritis of right knee 07/23/2015  . Primary osteoarthritis of knee 07/23/2015    Silvestre Mesi 10/30/2019, 11:11 AM  [facility] Physical Therapy [address] [place], [place], [address] Phone: [phone]   Fax:  [phone]  Name: VLAD ASAI MRN: [id number] Date of Birth: 1950/10/25

## 2019-11-01 ENCOUNTER — Encounter: Payer: Self-pay | Admitting: Physical Therapy

## 2019-11-01 ENCOUNTER — Other Ambulatory Visit: Payer: Self-pay

## 2019-11-01 ENCOUNTER — Ambulatory Visit (INDEPENDENT_AMBULATORY_CARE_PROVIDER_SITE_OTHER): Payer: Medicare Other | Admitting: Physical Therapy

## 2019-11-01 DIAGNOSIS — M6281 Muscle weakness (generalized): Secondary | ICD-10-CM | POA: Diagnosis not present

## 2019-11-01 DIAGNOSIS — M25562 Pain in left knee: Secondary | ICD-10-CM

## 2019-11-01 DIAGNOSIS — R600 Localized edema: Secondary | ICD-10-CM

## 2019-11-01 DIAGNOSIS — M25662 Stiffness of left knee, not elsewhere classified: Secondary | ICD-10-CM

## 2019-11-01 NOTE — Therapy (Signed)
[facility] Physical Therapy [address] [place], [place], [address] Phone: [phone]   Fax:  [phone]  Physical Therapy Treatment  Patient Details  Name: Larry [name] MRN: [id number] Date of Birth: [birth year] Referring Provider (PT): Dr. Joni Fears   Encounter Date: 11/01/2019  PT End of Session - 11/01/19 1312    Visit Number  5    Number of Visits  12    Date for PT Re-Evaluation  11/28/19    Authorization Type  MCR, BCBS    PT Start Time  1140    PT Stop Time  1230    PT Time Calculation (min)  50 min    Activity Tolerance  Patient tolerated treatment well    Behavior During Therapy  [facility] for tasks assessed/performed       Past Medical History:  Diagnosis Date  . Arthritis    "knees; left shoulder" (04/12/2013)  . Asthma    "as a child" (04/12/2013)  . DDD (degenerative disc disease), cervical   . DDD (degenerative disc disease), lumbar   . Difficult intubation    2012   CERVICAL FUSION   . Headache(784.0)    VERAPAMIL FOR PREVENTION   . 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 History of chicken pox    as an adult  . Hypoglycemia    last episode 1 yr. ago, just gets jittery  . Hypothyroidism   . Lumbar stenosis   . MRSA (methicillin resistant staph aureus) culture positive 09/10/2019   upper arm  . OSA (obstructive sleep apnea)    "haven't wore mask in years; had OR; still snore" (04/12/2013)  . Urinary frequency     Past Surgical History:  Procedure Laterality Date  . BACK SURGERY    . CERVICAL FUSION  2002; 2012  . CYSTECTOMY    . INGUINAL HERNIA REPAIR Bilateral 1997  . KNEE ARTHROSCOPY Left ~ 1997; ~ 2005  . LUMBAR FUSION  2007; 04/11/2013  . PENILE DEBRIDEMENT  ~ 2011   X 3  FOR MRSA INFECTION    . SHOULDER ARTHROSCOPY Left ~ 2003  . TONSILLECTOMY  1990's  . TOTAL KNEE ARTHROPLASTY Right 07/23/2015  . TOTAL KNEE  ARTHROPLASTY Right 07/23/2015   Procedure: TOTAL KNEE ARTHROPLASTY;  Surgeon: Garald Balding, MD;  Location: [place];  Service: Orthopedics;  Laterality: Right;  . TOTAL KNEE ARTHROPLASTY Left 10/03/2019   Procedure: LEFT TOTAL KNEE ARTHROPLASTY;  Surgeon: Garald Balding, MD;  Location[facility];  Service: Orthopedics;  Laterality: Left;  . UVULOPALATOPHARYNGOPLASTY (UPPP)/TONSILLECTOMY/SEPTOPLASTY  1990's  . VASECTOMY      There were no vitals filed for this visit.  Subjective Assessment - 11/01/19 1310    Subjective  reports soreness and muscle cramping at night; some difficulty with sleeping    Patient Stated Goals  recover faster    Currently in Pain?  Yes    Pain Score  --   mild to mod-did not rate   Pain Location  Knee    Pain Orientation  Left    Pain Descriptors / Indicators  Aching;Dull    Pain Type  Surgical pain;Acute pain    Pain Onset  1 to 4 weeks ago    Pain Frequency  Intermittent    Aggravating Factors   twisting motion with foot planted    Pain Relieving Factors  rest  [place] Adult PT Treatment/Exercise - 11/01/19 0001      Knee/Hip Exercises: Stretches   Other Knee/Hip Stretches  knee extension heel prop 5 min with gameready    Other Knee/Hip Stretches  standing incline calf stretch 30 sec X 3 reps      Knee/Hip Exercises: Aerobic   Recumbent Bike  5 min L2      Knee/Hip Exercises: Standing   Lateral Step Up Limitations  lateral step down 4 inch X 20 reps no UE support    Forward Step Up  Left;Step Height: 4";10 reps;Hand Hold: 0    Other Standing Knee Exercises  retrostep Lt LE was posterior X 20 reps, tandem fwd and retro 10 ft X 5 ea      Knee/Hip Exercises: Seated   Long Arc Quad  3 sets;15 reps;Left    Long Arc Quad Weight  8 lbs.    Other Seated Knee/Hip Exercises  sitting isometric knee extension holds 5 sec X 15 reps    Sit to Sand  20 reps;without UE support   18 inch     Vasopneumatic   Number Minutes  Vasopneumatic   8 minutes    Vasopnuematic Location   Knee    Vasopneumatic Pressure  Medium    Vasopneumatic Temperature   34      Manual Therapy   Manual therapy comments  grade II-III AP jt mobs Lt knee, MET contract-relax Lt knee flexion                   PT Long Term Goals - 10/17/19 1643      PT LONG TERM GOAL #1   Title  independent with HEP    Status  New    Target Date  11/28/19      PT LONG TERM GOAL #2   Title  improve Lt knee AROM 0-115 for improved function    Status  New    Target Date  11/28/19      PT LONG TERM GOAL #3   Title  report ability to amb without AD at least 30 min without increase in pain for improved function and preparation for return to work    Status  New    Target Date  11/28/19      PT LONG TERM GOAL #4   Title  negotiate stairs reciprocally with 1 handrail modified independent for improved function    Status  New    Target Date  11/28/19      PT LONG TERM GOAL #5   Title  demonstrate improved strength by performing at least 10 reps of SLR without quad lag    Status  New    Target Date  11/28/19            Plan - 11/01/19 1312    Clinical Impression Statement  Pt continues to amb with decreased stance phase on LLE, improved with exercises.  Will continue to benefit from PT to improve functional strength and movement activities.    Personal Factors and Comorbidities  Comorbidity 3+;Transportation    Comorbidities  hypothyroidism, OA, asthma, back pain s/p lumbar fusion and cervical fusion    Examination-Activity Limitations  Locomotion Level;Transfers;Squat;Stairs;Stand    Examination-Participation Restrictions  Other   occupation   Stability/Clinical Decision Making  Stable/Uncomplicated    Rehab Potential  Good    PT Frequency  2x / week    PT Duration  6 weeks    PT Treatment/Interventions  ADLs/Self Care Home  Management;Cryotherapy;Electrical Stimulation;DME Instruction;Moist Heat;Gait training;Stair  training;Functional mobility training;Patient/family education;Therapeutic activities;Therapeutic exercise;Balance training;Neuromuscular re-education;Manual techniques;Scar mobilization;Vasopneumatic Device;Taping;Passive range of motion;Dry needling    PT Next Visit Plan  update HEP PRN, work on flexion/maintain extension, progress strengthening exercises, manual/modalities    PT Home Exercise Plan  Access Code: [email or web address]    Consulted and Agree with Plan of Care  Patient       Patient will benefit from skilled therapeutic intervention in order to improve the following deficits and impairments:  Abnormal gait, Pain, Decreased mobility, Decreased range of motion, Decreased balance, Difficulty walking, Increased edema, Decreased strength, Impaired flexibility  Visit Diagnosis: Acute pain of left knee  Stiffness of left knee, not elsewhere classified  Muscle weakness (generalized)  Localized edema     Problem List Patient Active Problem List   Diagnosis Date Noted  . S/P TKR (total knee replacement) 10/10/2019  . S/P TKR (total knee replacement) using cement, left 10/03/2019  . Cellulitis of arm 08/28/2019  . Essential hypertension 02/16/2018  . Sepsis due to skin infection ([place]) 02/16/2018  . Chronic pain 02/16/2018  . Cellulitis of left lower extremity   . Lumbar radiculopathy, chronic 01/24/2018  . Lumbar stenosis with neurogenic claudication 03/23/2017  . Hypothyroidism 07/25/2015  . Primary osteoarthritis of right knee 07/23/2015  . Primary osteoarthritis of knee 07/23/2015    Session performed by Scot Jun, PT, DPT - documentation provided by Faustino Congress, PT, DPT and Elsie Ra, PT, DPT  Laureen Abrahams, PT, DPT 11/01/19 1:17 PM    [facility] Physical Therapy [address] [place], [place], [address] Phone: [phone]   Fax:  [phone]  Name: Larry [name] MRN: [id number] Date of Birth: 1951/09/17

## 2019-11-02 ENCOUNTER — Telehealth: Payer: Self-pay | Admitting: *Deleted

## 2019-11-02 ENCOUNTER — Other Ambulatory Visit: Payer: Self-pay | Admitting: Orthopaedic Surgery

## 2019-11-02 MED ORDER — HYDROCODONE-ACETAMINOPHEN 5-325 MG PO TABS: 1.0000 | ORAL_TABLET | Freq: Four times a day (QID) | ORAL | 0 refills | Status: DC | PRN

## 2019-11-02 MED ORDER — METHOCARBAMOL 500 MG PO TABS: 500.0000 mg | ORAL_TABLET | Freq: Three times a day (TID) | ORAL | 0 refills | Status: DC | PRN

## 2019-11-02 NOTE — Telephone Encounter (Signed)
Patient called and requested refill of Muscle relaxer and Hydrocodone. Informed Dr. Durward Fortes, who refilled Methocarbamol and Hydrocodone 5/325 mg today. Patient Updated on refills sent to his pharmacy.

## 2019-11-02 NOTE — Telephone Encounter (Signed)
Duplicate note

## 2019-11-03 DIAGNOSIS — Z471 Aftercare following joint replacement surgery: Secondary | ICD-10-CM | POA: Diagnosis not present

## 2019-11-06 ENCOUNTER — Other Ambulatory Visit: Payer: Self-pay

## 2019-11-06 ENCOUNTER — Encounter: Payer: Self-pay | Admitting: Physical Therapy

## 2019-11-06 ENCOUNTER — Ambulatory Visit (INDEPENDENT_AMBULATORY_CARE_PROVIDER_SITE_OTHER): Payer: Medicare Other | Admitting: Physical Therapy

## 2019-11-06 VITALS — BP 103/69 | HR 87

## 2019-11-06 DIAGNOSIS — M25662 Stiffness of left knee, not elsewhere classified: Secondary | ICD-10-CM

## 2019-11-06 DIAGNOSIS — M25562 Pain in left knee: Secondary | ICD-10-CM | POA: Diagnosis not present

## 2019-11-06 DIAGNOSIS — R600 Localized edema: Secondary | ICD-10-CM

## 2019-11-06 DIAGNOSIS — M6281 Muscle weakness (generalized): Secondary | ICD-10-CM

## 2019-11-06 NOTE — Therapy (Signed)
[facility] Physical Therapy [address] [place], [place], [address] Phone: [phone]   Fax:  [phone]  Physical Therapy Treatment  Patient Details  Name: Larry [name] MRN: [id number] Date of Birth: Jan 18, 1951 Referring Provider (PT): Dr. Joni Fears   Encounter Date: 11/06/2019  PT End of Session - 11/06/19 1600    Visit Number  6    Number of Visits  12    Date for PT Re-Evaluation  11/28/19    Authorization Type  MCR, BCBS    PT Start Time  1521    PT Stop Time  1610    PT Time Calculation (min)  49 min    Activity Tolerance  Patient tolerated treatment well    Behavior During Therapy  [facility] for tasks assessed/performed       Past Medical History:  Diagnosis Date  . Arthritis    "knees; left shoulder" (04/12/2013)  . Asthma    "as a child" (04/12/2013)  . DDD (degenerative disc disease), cervical   . DDD (degenerative disc disease), lumbar   . Difficult intubation    2012   CERVICAL FUSION   . Headache(784.0)    VERAPAMIL FOR PREVENTION   . 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 History of chicken pox    as an adult  . Hypoglycemia    last episode 1 yr. ago, just gets jittery  . Hypothyroidism   . Lumbar stenosis   . MRSA (methicillin resistant staph aureus) culture positive 09/10/2019   upper arm  . OSA (obstructive sleep apnea)    "haven't wore mask in years; had OR; still snore" (04/12/2013)  . Urinary frequency     Past Surgical History:  Procedure Laterality Date  . BACK SURGERY    . CERVICAL FUSION  2002; 2012  . CYSTECTOMY    . INGUINAL HERNIA REPAIR Bilateral 1997  . KNEE ARTHROSCOPY Left ~ 1997; ~ 2005  . LUMBAR FUSION  2007; 04/11/2013  . PENILE DEBRIDEMENT  ~ 2011   X 3  FOR MRSA INFECTION    . SHOULDER ARTHROSCOPY Left ~ 2003  . TONSILLECTOMY  1990's  . TOTAL KNEE ARTHROPLASTY Right 07/23/2015  . TOTAL KNEE  ARTHROPLASTY Right 07/23/2015   Procedure: TOTAL KNEE ARTHROPLASTY;  Surgeon: Garald Balding, MD;  Location: [place];  Service: Orthopedics;  Laterality: Right;  . TOTAL KNEE ARTHROPLASTY Left 10/03/2019   Procedure: LEFT TOTAL KNEE ARTHROPLASTY;  Surgeon: Garald Balding, MD;  Location: [facility];  Service: Orthopedics;  Laterality: Left;  . UVULOPALATOPHARYNGOPLASTY (UPPP)/TONSILLECTOMY/SEPTOPLASTY  1990's  . VASECTOMY      Vitals:   11/06/19 1529  BP: 103/69  Pulse: 87  SpO2: 97%    Subjective Assessment - 11/06/19 1525    Subjective  doing okay; still only sleeping 3-4 hours at a time    Patient Stated Goals  recover faster    Currently in Pain?  No/denies    Pain Onset  1 to 4 weeks ago                       [facility] Adult PT Treatment/Exercise - 11/06/19 1525      Knee/Hip Exercises: Stretches   Passive Hamstring Stretch  Left;3 reps;30 seconds    Passive Hamstring Stretch Limitations  supine with strap; overpressure at distal thigh      Knee/Hip Exercises: Aerobic  Recumbent Bike  L3 x 6 min      Knee/Hip Exercises: Machines for Strengthening   Total Gym Leg Press  LLE only 56# x10; 75# 2x10      Knee/Hip Exercises: Standing   Lateral Step Up  Left;20 reps;Hand Hold: 0;Step Height: 6"    Forward Step Up  Left;20 reps;Hand Hold: 0;Step Height: 6"    Functional Squat  2 sets;10 reps;3 seconds   TRX   Other Standing Knee Exercises  single limb deadlift with UE support; LLE 10# KB 2x10      Knee/Hip Exercises: Seated   Long Arc Quad  3 sets;15 reps;Left    Long Arc Quad Weight  8 lbs.    Sit to Sand  20 reps;without UE support      Vasopneumatic   Number Minutes Vasopneumatic   10 minutes    Vasopnuematic Location   Knee    Vasopneumatic Pressure  Medium    Vasopneumatic Temperature   34                  PT Long Term Goals - 10/17/19 1643      PT LONG TERM GOAL #1   Title  independent with HEP    Status  New    Target Date   11/28/19      PT LONG TERM GOAL #2   Title  improve Lt knee AROM 0-115 for improved function    Status  New    Target Date  11/28/19      PT LONG TERM GOAL #3   Title  report ability to amb without AD at least 30 min without increase in pain for improved function and preparation for return to work    Status  New    Target Date  11/28/19      PT LONG TERM GOAL #4   Title  negotiate stairs reciprocally with 1 handrail modified independent for improved function    Status  New    Target Date  11/28/19      PT LONG TERM GOAL #5   Title  demonstrate improved strength by performing at least 10 reps of SLR without quad lag    Status  New    Target Date  11/28/19            Plan - 11/06/19 1600    Clinical Impression Statement  Pt tolerated strengthening exercises well today with min c/o muscle fatigue.  Overall progressing well with PT.    Personal Factors and Comorbidities  Comorbidity 3+;Transportation    Comorbidities  hypothyroidism, OA, asthma, back pain s/p lumbar fusion and cervical fusion    Examination-Activity Limitations  Locomotion Level;Transfers;Squat;Stairs;Stand    Examination-Participation Restrictions  Other   occupation   Stability/Clinical Decision Making  Stable/Uncomplicated    Rehab Potential  Good    PT Frequency  2x / week    PT Duration  6 weeks    PT Treatment/Interventions  ADLs/Self Care Home Management;Cryotherapy;Electrical Stimulation;DME Instruction;Moist Heat;Gait training;Stair training;Functional mobility training;Patient/family education;Therapeutic activities;Therapeutic exercise;Balance training;Neuromuscular re-education;Manual techniques;Scar mobilization;Vasopneumatic Device;Taping;Passive range of motion;Dry needling    PT Next Visit Plan  update HEP PRN, work on flexion/maintain extension, progress strengthening exercises, manual/modalities    PT Home Exercise Plan  Access Code: [email or web address]    Consulted and Agree with Plan of Care  Patient        Patient will benefit from skilled therapeutic intervention in order to improve the following deficits and impairments:  Abnormal gait, Pain,  Decreased mobility, Decreased range of motion, Decreased balance, Difficulty walking, Increased edema, Decreased strength, Impaired flexibility  Visit Diagnosis: Stiffness of left knee, not elsewhere classified  Acute pain of left knee  Muscle weakness (generalized)  Localized edema     Problem List Patient Active Problem List   Diagnosis Date Noted  . S/P TKR (total knee replacement) 10/10/2019  . S/P TKR (total knee replacement) using cement, left 10/03/2019  . Cellulitis of arm 08/28/2019  . Essential hypertension 02/16/2018  . Sepsis due to skin infection ([place]) 02/16/2018  . Chronic pain 02/16/2018  . Cellulitis of left lower extremity   . Lumbar radiculopathy, chronic 01/24/2018  . Lumbar stenosis with neurogenic claudication 03/23/2017  . Hypothyroidism 07/25/2015  . Primary osteoarthritis of right knee 07/23/2015  . Primary osteoarthritis of knee 07/23/2015      Laureen Abrahams, PT, DPT 11/06/19 4:01 PM     [facility] Physical Therapy [address] [place], [place], [address] Phone: [phone]   Fax:  [phone]  Name: Larry [name] MRN: [id number] Date of Birth: [birth year]

## 2019-11-07 DIAGNOSIS — R5082 Postprocedural fever: Secondary | ICD-10-CM | POA: Diagnosis not present

## 2019-11-07 DIAGNOSIS — I10 Essential (primary) hypertension: Secondary | ICD-10-CM | POA: Diagnosis not present

## 2019-11-07 DIAGNOSIS — R0600 Dyspnea, unspecified: Secondary | ICD-10-CM | POA: Diagnosis not present

## 2019-11-07 DIAGNOSIS — R609 Edema, unspecified: Secondary | ICD-10-CM | POA: Diagnosis not present

## 2019-11-07 DIAGNOSIS — R5383 Other fatigue: Secondary | ICD-10-CM | POA: Diagnosis not present

## 2019-11-07 DIAGNOSIS — F419 Anxiety disorder, unspecified: Secondary | ICD-10-CM | POA: Diagnosis not present

## 2019-11-07 DIAGNOSIS — R630 Anorexia: Secondary | ICD-10-CM | POA: Diagnosis not present

## 2019-11-08 ENCOUNTER — Telehealth: Payer: Self-pay | Admitting: *Deleted

## 2019-11-08 NOTE — Care Plan (Signed)
Call to patient for a 30 day Ortho bundle call. He verbalized he is doing well overall. Attending therapy weekly at [facility] and feels this is going well. Having some swelling when he is up more on his knee. Taking pain medication as needed. Also currently continuing to covering from pneumonia diagnosed by his PCP recently. Patient satisfaction survey completed.

## 2019-11-08 NOTE — Telephone Encounter (Signed)
Ortho bundle 30 day call completed. °

## 2019-11-09 ENCOUNTER — Encounter: Payer: Self-pay | Admitting: Physical Therapy

## 2019-11-09 ENCOUNTER — Other Ambulatory Visit: Payer: Self-pay

## 2019-11-09 ENCOUNTER — Ambulatory Visit (INDEPENDENT_AMBULATORY_CARE_PROVIDER_SITE_OTHER): Payer: Medicare Other | Admitting: Physical Therapy

## 2019-11-09 DIAGNOSIS — R600 Localized edema: Secondary | ICD-10-CM

## 2019-11-09 DIAGNOSIS — M25662 Stiffness of left knee, not elsewhere classified: Secondary | ICD-10-CM

## 2019-11-09 DIAGNOSIS — M6281 Muscle weakness (generalized): Secondary | ICD-10-CM

## 2019-11-09 DIAGNOSIS — M25562 Pain in left knee: Secondary | ICD-10-CM

## 2019-11-09 NOTE — Therapy (Signed)
[facility] Physical Therapy [address] [place], [place], [address] Phone: [phone]   Fax:  [phone]  Physical Therapy Treatment  Patient Details  Name: Larry [name] MRN: [id number] Date of Birth: [birth year] Referring Provider (PT): Dr. Joni Fears   Encounter Date: 11/09/2019  PT End of Session - 11/09/19 1405    Visit Number  7    Number of Visits  12    Date for PT Re-Evaluation  11/28/19    Authorization Type  MCR, BCBS    PT Start Time  1315    PT Stop Time  1405    PT Time Calculation (min)  50 min    Activity Tolerance  Patient tolerated treatment well    Behavior During Therapy  [facility] for tasks assessed/performed       Past Medical History:  Diagnosis Date  . Arthritis    "knees; left shoulder" (04/12/2013)  . Asthma    "as a child" (04/12/2013)  . DDD (degenerative disc disease), cervical   . DDD (degenerative disc disease), lumbar   . Difficult intubation    2012   CERVICAL FUSION   . Headache(784.0)    VERAPAMIL FOR PREVENTION   . 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 History of chicken pox    as an adult  . Hypoglycemia    last episode 1 yr. ago, just gets jittery  . Hypothyroidism   . Lumbar stenosis   . MRSA (methicillin resistant staph aureus) culture positive 09/10/2019   upper arm  . OSA (obstructive sleep apnea)    "haven't wore mask in years; had OR; still snore" (04/12/2013)  . Urinary frequency     Past Surgical History:  Procedure Laterality Date  . BACK SURGERY    . CERVICAL FUSION  2002; 2012  . CYSTECTOMY    . INGUINAL HERNIA REPAIR Bilateral 1997  . KNEE ARTHROSCOPY Left ~ 1997; ~ 2005  . LUMBAR FUSION  2007; 04/11/2013  . PENILE DEBRIDEMENT  ~ 2011   X 3  FOR MRSA INFECTION    . SHOULDER ARTHROSCOPY Left ~ 2003  . TONSILLECTOMY  1990's  . TOTAL KNEE ARTHROPLASTY Right 07/23/2015  . TOTAL KNEE  ARTHROPLASTY Right 07/23/2015   Procedure: TOTAL KNEE ARTHROPLASTY;  Surgeon: Garald Balding, MD;  Location: [place];  Service: Orthopedics;  Laterality: Right;  . TOTAL KNEE ARTHROPLASTY Left 10/03/2019   Procedure: LEFT TOTAL KNEE ARTHROPLASTY;  Surgeon: Garald Balding, MD;  [facility];  Service: Orthopedics;  Laterality: Left;  . UVULOPALATOPHARYNGOPLASTY (UPPP)/TONSILLECTOMY/SEPTOPLASTY  1990's  . VASECTOMY      There were no vitals filed for this visit.  Subjective Assessment - 11/09/19 1312    Subjective  doing well; going up/down stairs.  still having pain at night    Patient Stated Goals  recover faster    Currently in Pain?  Yes    Pain Score  4     Pain Location  Knee    Pain Orientation  Left    Pain Descriptors / Indicators  Aching;Sore    Pain Type  Surgical pain;Acute pain    Pain Onset  1 to 4 weeks ago    Pain Frequency  Intermittent    Aggravating Factors   night time    Pain Relieving Factors  rest  [place] Adult PT Treatment/Exercise - 11/09/19 1317      Knee/Hip Exercises: Stretches   Passive Hamstring Stretch  Left;3 reps;30 seconds    Passive Hamstring Stretch Limitations  supine with strap; overpressure at distal thigh    Quad Stretch  Left;3 reps;30 seconds    Quad Stretch Limitations  prone with strap      Knee/Hip Exercises: Aerobic   Recumbent Bike  L3 x 6 min      Knee/Hip Exercises: Machines for Strengthening   Total Gym Leg Press  LLE 75# x 20; 125# 2x10      Knee/Hip Exercises: Standing   Functional Squat  2 sets;10 reps;3 seconds   10# KB   Other Standing Knee Exercises  single limb deadlift with UE support; LLE 10# KB 2x10      Knee/Hip Exercises: Seated   Long Arc Quad  3 sets;15 reps;Left    Long Arc Quad Weight  9 lbs.      Knee/Hip Exercises: Prone   Hamstring Curl  3 sets;15 reps   9#     Vasopneumatic   Number Minutes Vasopneumatic   10 minutes    Vasopnuematic Location   Knee     Vasopneumatic Pressure  Medium    Vasopneumatic Temperature   34                  PT Long Term Goals - 11/09/19 1405      PT LONG TERM GOAL #1   Title  independent with HEP    Status  New    Target Date  11/28/19      PT LONG TERM GOAL #2   Title  improve Lt knee AROM 0-115 for improved function    Status  Achieved      PT LONG TERM GOAL #3   Title  report ability to amb without AD at least 30 min without increase in pain for improved function and preparation for return to work    Status  On-going      PT LONG TERM GOAL #4   Title  negotiate stairs reciprocally with 1 handrail modified independent for improved function    Status  On-going      PT LONG TERM GOAL #5   Title  demonstrate improved strength by performing at least 10 reps of SLR without quad lag    Status  On-going            Plan - 11/09/19 1406    Clinical Impression Statement  Pt has met ROM goal, and is overall doing well with PT, and anticipate he will be prepared for d/c and transition to community fitness next 1-2 weeks. Still has some strength deficits at this time, and will spend remaining sessions focusing on strength.    Personal Factors and Comorbidities  Comorbidity 3+;Transportation    Comorbidities  hypothyroidism, OA, asthma, back pain s/p lumbar fusion and cervical fusion    Examination-Activity Limitations  Locomotion Level;Transfers;Squat;Stairs;Stand    Examination-Participation Restrictions  Other   occupation   Stability/Clinical Decision Making  Stable/Uncomplicated    Rehab Potential  Good    PT Frequency  2x / week    PT Duration  6 weeks    PT Treatment/Interventions  ADLs/Self Care Home Management;Cryotherapy;Electrical Stimulation;DME Instruction;Moist Heat;Gait training;Stair training;Functional mobility training;Patient/family education;Therapeutic activities;Therapeutic exercise;Balance training;Neuromuscular re-education;Manual techniques;Scar  mobilization;Vasopneumatic Device;Taping;Passive range of motion;Dry needling    PT Next Visit Plan  update HEP PRN, work on flexion/maintain extension, progress strengthening exercises, manual/modalities;  needs MD note    PT Home Exercise Plan  Access Code: [email or web address]    Consulted and Agree with Plan of Care  Patient       Patient will benefit from skilled therapeutic intervention in order to improve the following deficits and impairments:  Abnormal gait, Pain, Decreased mobility, Decreased range of motion, Decreased balance, Difficulty walking, Increased edema, Decreased strength, Impaired flexibility  Visit Diagnosis: Stiffness of left knee, not elsewhere classified  Acute pain of left knee  Muscle weakness (generalized)  Localized edema     Problem List Patient Active Problem List   Diagnosis Date Noted  . S/P TKR (total knee replacement) 10/10/2019  . S/P TKR (total knee replacement) using cement, left 10/03/2019  . Cellulitis of arm 08/28/2019  . Essential hypertension 02/16/2018  . Sepsis due to skin infection ([place]) 02/16/2018  . Chronic pain 02/16/2018  . Cellulitis of left lower extremity   . Lumbar radiculopathy, chronic 01/24/2018  . Lumbar stenosis with neurogenic claudication 03/23/2017  . Hypothyroidism 07/25/2015  . Primary osteoarthritis of right knee 07/23/2015  . Primary osteoarthritis of knee 07/23/2015      Laureen Abrahams, PT, DPT 11/09/19 2:08 PM     [facility] Physical Therapy [address] [place], [place], [address] Phone: [phone]   Fax:  [phone]  Name: KHAI ARRONA MRN: [id number] Date of Birth: Jan 19, 1951

## 2019-11-12 ENCOUNTER — Ambulatory Visit: Payer: Medicare Other | Attending: Internal Medicine

## 2019-11-12 DIAGNOSIS — Z23 Encounter for immunization: Secondary | ICD-10-CM | POA: Insufficient documentation

## 2019-11-12 NOTE — Progress Notes (Signed)
   Covid-19 Vaccination Clinic  Name:  Larry [name]    MRN: [id number] DOB: [birth year]  11/12/2019  Larry [name] was observed post Covid-19 immunization for 15 minutes without incidence. He was provided with Vaccine Information Sheet and instruction to access the V-Safe system.   Larry [name] was instructed to call 911 with any severe reactions post vaccine: Marland Kitchen Difficulty breathing  . Swelling of your face and throat  . A fast heartbeat  . A bad rash all over your body  . Dizziness and weakness    Immunizations Administered    Name Date Dose VIS Date Route   Pfizer COVID-19 Vaccine 11/12/2019  9:46 AM 0.3 mL 09/08/2019 Intramuscular   Manufacturer: [place]   Lot: [id number]   [place]: [id number]

## 2019-11-13 ENCOUNTER — Encounter: Payer: Medicare Other | Admitting: Physical Therapy

## 2019-11-14 ENCOUNTER — Other Ambulatory Visit: Payer: Self-pay

## 2019-11-14 ENCOUNTER — Encounter: Payer: Self-pay | Admitting: Orthopaedic Surgery

## 2019-11-14 ENCOUNTER — Ambulatory Visit (INDEPENDENT_AMBULATORY_CARE_PROVIDER_SITE_OTHER): Payer: Medicare Other | Admitting: Rehabilitative and Restorative Service Providers"

## 2019-11-14 ENCOUNTER — Ambulatory Visit (INDEPENDENT_AMBULATORY_CARE_PROVIDER_SITE_OTHER): Payer: Medicare Other | Admitting: Orthopaedic Surgery

## 2019-11-14 ENCOUNTER — Encounter: Payer: Self-pay | Admitting: Rehabilitative and Restorative Service Providers"

## 2019-11-14 VITALS — Ht 66.0 in | Wt 165.0 lb

## 2019-11-14 DIAGNOSIS — M545 Low back pain, unspecified: Secondary | ICD-10-CM

## 2019-11-14 DIAGNOSIS — R600 Localized edema: Secondary | ICD-10-CM

## 2019-11-14 DIAGNOSIS — R2681 Unsteadiness on feet: Secondary | ICD-10-CM

## 2019-11-14 DIAGNOSIS — M25662 Stiffness of left knee, not elsewhere classified: Secondary | ICD-10-CM | POA: Diagnosis not present

## 2019-11-14 DIAGNOSIS — M6281 Muscle weakness (generalized): Secondary | ICD-10-CM

## 2019-11-14 DIAGNOSIS — M170 Bilateral primary osteoarthritis of knee: Secondary | ICD-10-CM

## 2019-11-14 DIAGNOSIS — M25562 Pain in left knee: Secondary | ICD-10-CM

## 2019-11-14 DIAGNOSIS — G8929 Other chronic pain: Secondary | ICD-10-CM | POA: Diagnosis not present

## 2019-11-14 DIAGNOSIS — Z96652 Presence of left artificial knee joint: Secondary | ICD-10-CM

## 2019-11-14 NOTE — Therapy (Signed)
[facility] Physical Therapy [address] [place], [place], [address] Phone: [phone]   Fax:  [phone]  Physical Therapy Treatment/Progress Note  Patient Details  Name: Larry [name] MRN: [id number] Date of Birth: [birth year] Referring Provider (PT): Dr. Joni Fears   Encounter Date: 11/14/2019  PT End of Session - 11/14/19 1129    Visit Number  8    Number of Visits  12    Date for PT Re-Evaluation  11/28/19    Authorization Type  MCR, BCBS    PT Start Time  1114    PT Stop Time  1205    PT Time Calculation (min)  51 min    Activity Tolerance  Patient tolerated treatment well    Behavior During Therapy  [facility] for tasks assessed/performed       Past Medical History:  Diagnosis Date  . Arthritis    "knees; left shoulder" (04/12/2013)  . Asthma    "as a child" (04/12/2013)  . DDD (degenerative disc disease), cervical   . DDD (degenerative disc disease), lumbar   . Difficult intubation    2012   CERVICAL FUSION   . Headache(784.0)    VERAPAMIL FOR PREVENTION   . 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 History of chicken pox    as an adult  . Hypoglycemia    last episode 1 yr. ago, just gets jittery  . Hypothyroidism   . Lumbar stenosis   . MRSA (methicillin resistant staph aureus) culture positive 09/10/2019   upper arm  . OSA (obstructive sleep apnea)    "haven't wore mask in years; had OR; still snore" (04/12/2013)  . Urinary frequency     Past Surgical History:  Procedure Laterality Date  . BACK SURGERY    . CERVICAL FUSION  2002; 2012  . CYSTECTOMY    . INGUINAL HERNIA REPAIR Bilateral 1997  . KNEE ARTHROSCOPY Left ~ 1997; ~ 2005  . LUMBAR FUSION  2007; 04/11/2013  . PENILE DEBRIDEMENT  ~ 2011   X 3  FOR MRSA INFECTION    . SHOULDER ARTHROSCOPY Left ~ 2003  . TONSILLECTOMY  1990's  . TOTAL KNEE ARTHROPLASTY Right 07/23/2015  . TOTAL  KNEE ARTHROPLASTY Right 07/23/2015   Procedure: TOTAL KNEE ARTHROPLASTY;  Surgeon: Garald Balding, MD;  Location: [facility];  Service: Orthopedics;  Laterality: Right;  . TOTAL KNEE ARTHROPLASTY Left 10/03/2019   Procedure: LEFT TOTAL KNEE ARTHROPLASTY;  Surgeon: Garald Balding, MD;  Location: [facility];  Service: Orthopedics;  Laterality: Left;  . UVULOPALATOPHARYNGOPLASTY (UPPP)/TONSILLECTOMY/SEPTOPLASTY  1990's  . VASECTOMY      There were no vitals filed for this visit.  Subjective Assessment - 11/14/19 1054    Subjective  Pt. rated Global rating of Change for condition at +7 compared to evaluation.   Pt. indicated sleeping at night in bed is still troublesome, standing prolonged on leg, and still having swelling.    Patient Stated Goals  recover faster    Currently in Pain?  Yes    Pain Score  4     Pain Location  Knee    Pain Orientation  Left    Pain Descriptors / Indicators  Aching;Sore    Pain Type  Acute pain;Surgical pain    Pain Onset  1 to 4 weeks ago    Pain Frequency  Intermittent    Aggravating Factors  Nighttime, standing prolonged         [facility] PT Assessment - 11/14/19 0001      Functional Tests   Functional tests  Single leg stance      Single Leg Stance   Comments  LLE 10 seconds      AROM   Left Knee Extension  -5   measured in sitting LAQ   Left Knee Flexion  120   measured in supine     PROM   Left Knee Extension  0    Left Knee Flexion  120      Strength   Strength Assessment Site  Hip;Knee;Ankle    Right/Left Hip  Left    Left Hip Flexion  5/5    Right/Left Knee  Left    Left Knee Flexion  5/5    Left Knee Extension  4+/5    Right/Left Ankle  Left    Left Ankle Dorsiflexion  5/5                   [facility] Adult PT Treatment/Exercise - 11/14/19 0001      Knee/Hip Exercises: Aerobic   Recumbent Bike  L3 10 mins      Knee/Hip Exercises: Machines for Strengthening   Total Gym Leg Press  LLE 75 3 x 10       Knee/Hip Exercises:  Standing   Forward Step Up  Both;3 sets;10 reps    Functional Squat  2 sets;10 reps   10 lb   Other Standing Knee Exercises  SLS 15 sec x 6 bilat    Other Standing Knee Exercises  single limb deadlift with UE support; LLE 10# KB 2x10      Knee/Hip Exercises: Supine   Bridges  Strengthening;2 sets;10 reps    Straight Leg Raises  10 reps;1 set      Vasopneumatic   Number Minutes Vasopneumatic   10 minutes    Vasopnuematic Location   Knee    Vasopneumatic Pressure  Medium    Vasopneumatic Temperature   34             PT Education - 11/14/19 1059    Education Details  Occasional cues for intervention in clinic today.    Person(s) Educated  Patient    Methods  Verbal cues    Comprehension  Verbalized understanding;Returned demonstration          PT Long Term Goals - 11/14/19 1059      PT LONG TERM GOAL #1   Title  independent with HEP    Baseline  11/14/2019:  Pt. indicated knowledge of HEP but not 100% consistent with daily plan    Time  8    Period  Weeks    Status  Partially Met    Target Date  11/28/19      PT LONG TERM GOAL #2   Title  improve Lt knee AROM 0-115 for improved function    Time  8    Period  Weeks    Status  Achieved    Target Date  11/28/19      PT LONG TERM GOAL #3   Title  report ability to amb without AD at least 30 min without increase in pain for improved function and preparation for return to work    Baseline  11/14/2019: Independent but c gait deviation    Time  8    Period  Weeks    Status  Partially Met  Target Date  11/28/19      PT LONG TERM GOAL #4   Title  negotiate stairs reciprocally with 1 handrail modified independent for improved function    Baseline  11/14/2019: Able to perform 3-4 steps with handrail, reported he hasn't performed full flight c reciprocal gait pattern.    Time  8    Period  Weeks    Status  Partially Met    Target Date  11/28/19      PT LONG TERM GOAL #5   Title  demonstrate improved strength by  performing at least 10 reps of SLR without quad lag    Time  8    Period  Weeks    Status  On-going    Target Date  11/28/19            Plan - 11/14/19 1102    Clinical Impression Statement  Pt. has attened 8 visits for current episode of care.  Pt. has reported +7 on Global rating of change with continued pain but reduced severity during daily activity.  See objective data for updated information.  Pt. has made improvements but continues to report pain demonstrates objective impairments that indicated necessity for continued skilled PT services at this time.    Personal Factors and Comorbidities  Comorbidity 3+;Transportation    Comorbidities  hypothyroidism, OA, asthma, back pain s/p lumbar fusion and cervical fusion    Examination-Activity Limitations  Locomotion Level;Transfers;Squat;Stairs;Stand    Examination-Participation Restrictions  Other   occupation   Stability/Clinical Decision Making  Stable/Uncomplicated    Rehab Potential  Good    PT Frequency  2x / week    PT Duration  6 weeks    PT Treatment/Interventions  ADLs/Self Care Home Management;Cryotherapy;Electrical Stimulation;DME Instruction;Moist Heat;Gait training;Stair training;Functional mobility training;Patient/family education;Therapeutic activities;Therapeutic exercise;Balance training;Neuromuscular re-education;Manual techniques;Scar mobilization;Vasopneumatic Device;Taping;Passive range of motion;Dry needling    PT Next Visit Plan  return to MD, continue to improve HEP and improve strength, mobility and gait cycle.    PT Home Exercise Plan  Access Code: [email or web address]    Consulted and Agree with Plan of Care  Patient       Patient will benefit from skilled therapeutic intervention in order to improve the following deficits and impairments:  Abnormal gait, Pain, Decreased mobility, Decreased range of motion, Decreased balance, Difficulty walking, Increased edema, Decreased strength, Impaired flexibility  Visit  Diagnosis: Stiffness of left knee, not elsewhere classified  Acute pain of left knee  Muscle weakness (generalized)  Localized edema  Chronic bilateral low back pain without sciatica  Unsteadiness on feet     Problem List Patient Active Problem List   Diagnosis Date Noted  . S/P TKR (total knee replacement) 10/10/2019  . S/P TKR (total knee replacement) using cement, left 10/03/2019  . Cellulitis of arm 08/28/2019  . Essential hypertension 02/16/2018  . Sepsis due to skin infection ([place]) 02/16/2018  . Chronic pain 02/16/2018  . Cellulitis of left lower extremity   . Lumbar radiculopathy, chronic 01/24/2018  . Lumbar stenosis with neurogenic claudication 03/23/2017  . Hypothyroidism 07/25/2015  . Primary osteoarthritis of right knee 07/23/2015  . Primary osteoarthritis of knee 07/23/2015    Scot Jun, PT, DPT, OCS, ATC 11/14/19  11:40 AM    [facility] Physical Therapy [address] [place], [place], [address] Phone: [phone]   Fax:  [phone]  Name: Larry [name] MRN: [id number] Date of Birth: [birth year]

## 2019-11-14 NOTE — Progress Notes (Signed)
Office Visit Note   Patient: Larry [name]           Date of Birth: 09-Apr-1951           MRN: [id number] Visit Date: 11/14/2019              Requested by: Merrilee Seashore, [place],  [place] [address] PCP: Merrilee Seashore, MD   Assessment & Plan: Visit Diagnoses:  1. Primary osteoarthritis of both knees   2. Status post total left knee replacement     Plan: 5 to 6 weeks status post primary left total knee replacement doing very well.  No ambulatory aid.  Has gained 120 degrees of flexion.  Some discomfort at night but quads are still weak.  Plans to go back to work about 26 March and might need a note.  Okay to drive taking occasional Vicodin at night.  Finished up physical therapy with a home exercise program  Follow-Up Instructions: Return in about 3 months (around 02/11/2020).   Orders:  No orders of the defined types were placed in this encounter.  No orders of the defined types were placed in this encounter.     Procedures: No procedures performed   Clinical Data: No additional findings.   Subjective: Chief Complaint  Patient presents with  . Left Knee - Follow-up    Left TKA DOS 10/03/2019  Patient presents today for follow up on his left knee. He had a left total knee arthroplasty on 10/03/2019 and is now 6 weeks out from surgery. Patient states that he is doing very well. He does have pain at night when lying in his bed if his knees touch. The pain is located medially. He takes Vicodin at night, and only at night. He is going to physical therapy twice weekly. He does state that he has some soreness in his thigh and ankle. 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  HPI  Review of Systems   Objective: Vital Signs: Ht 5\' 6"  (1.676 m)   Wt 165 lb (74.8 kg)   BMI 26.63 kg/m   Physical Exam  Ortho Exam left total knee incision healing without a problem.  No instability.   Full quick extension that appears to be full with 120 degrees of flexion.  No instability.  No effusion.  No calf pain.  Neurologically intact.  Still has some mild thigh atrophy  Specialty Comments:  No specialty comments available.  Imaging: No results found.   PMFS History: Patient Active Problem List   Diagnosis Date Noted  . S/P TKR (total knee replacement) 10/10/2019  . S/P TKR (total knee replacement) using cement, left 10/03/2019  . Cellulitis of arm 08/28/2019  . Essential hypertension 02/16/2018  . Sepsis due to skin infection ([place]) 02/16/2018  . Chronic pain 02/16/2018  . Cellulitis of left lower extremity   . Lumbar radiculopathy, chronic 01/24/2018  . Lumbar stenosis with neurogenic claudication 03/23/2017  . Hypothyroidism 07/25/2015  . Primary osteoarthritis of right knee 07/23/2015  . Primary osteoarthritis of knee 07/23/2015   Past Medical History:  Diagnosis Date  . Arthritis    "knees; left shoulder" (04/12/2013)  . Asthma    "as a child" (04/12/2013)  . DDD (degenerative disc disease), cervical   . DDD (degenerative disc disease), lumbar   . Difficult intubation    2012   CERVICAL FUSION   . Headache(784.0)    VERAPAMIL FOR  PREVENTION   . 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 History of chicken pox    as an adult  . Hypoglycemia    last episode 1 yr. ago, just gets jittery  . Hypothyroidism   . Lumbar stenosis   . MRSA (methicillin resistant staph aureus) culture positive 09/10/2019   upper arm  . OSA (obstructive sleep apnea)    "haven't wore mask in years; had OR; still snore" (04/12/2013)  . Urinary frequency     History reviewed. No pertinent family history.  Past Surgical History:  Procedure Laterality Date  . BACK SURGERY    . CERVICAL FUSION  2002; 2012  . CYSTECTOMY    . INGUINAL HERNIA REPAIR Bilateral 1997  . KNEE ARTHROSCOPY Left ~ 1997; ~  2005  . LUMBAR FUSION  2007; 04/11/2013  . PENILE DEBRIDEMENT  ~ 2011   X 3  FOR MRSA INFECTION    . SHOULDER ARTHROSCOPY Left ~ 2003  . TONSILLECTOMY  1990's  . TOTAL KNEE ARTHROPLASTY Right 07/23/2015  . TOTAL KNEE ARTHROPLASTY Right 07/23/2015   Procedure: TOTAL KNEE ARTHROPLASTY;  Surgeon: Garald Balding, MD;  Location: [facility];  Service: Orthopedics;  Laterality: Right;  . TOTAL KNEE ARTHROPLASTY Left 10/03/2019   Procedure: LEFT TOTAL KNEE ARTHROPLASTY;  Surgeon: Garald Balding, MD;  [facility];  Service: Orthopedics;  Laterality: Left;  . UVULOPALATOPHARYNGOPLASTY (UPPP)/TONSILLECTOMY/SEPTOPLASTY  1990's  . VASECTOMY     Social History   Occupational History  . Not on file  Tobacco Use  . Smoking status: Former Smoker    Packs/day: 0.50    Years: 15.00    Pack years: 7.50    Types: Cigarettes    Quit date: 10/14/1986    Years since quitting: 33.1  . Smokeless tobacco: Never Used  Substance and Sexual Activity  . Alcohol use: Yes    Comment: occasional  . Drug use: No  . Sexual activity: Yes

## 2019-11-15 ENCOUNTER — Encounter: Payer: Medicare Other | Admitting: Physical Therapy

## 2019-11-16 ENCOUNTER — Encounter: Payer: Medicare Other | Admitting: Physical Therapy

## 2019-11-20 ENCOUNTER — Encounter: Payer: Self-pay | Admitting: Physical Therapy

## 2019-11-20 ENCOUNTER — Other Ambulatory Visit: Payer: Self-pay

## 2019-11-20 ENCOUNTER — Ambulatory Visit (INDEPENDENT_AMBULATORY_CARE_PROVIDER_SITE_OTHER): Payer: Medicare Other | Admitting: Physical Therapy

## 2019-11-20 DIAGNOSIS — M6281 Muscle weakness (generalized): Secondary | ICD-10-CM

## 2019-11-20 DIAGNOSIS — R600 Localized edema: Secondary | ICD-10-CM | POA: Diagnosis not present

## 2019-11-20 DIAGNOSIS — M25662 Stiffness of left knee, not elsewhere classified: Secondary | ICD-10-CM

## 2019-11-20 DIAGNOSIS — M25562 Pain in left knee: Secondary | ICD-10-CM | POA: Diagnosis not present

## 2019-11-20 NOTE — Therapy (Signed)
[facility] Physical Therapy [address] [place], [place], [address] Phone: [phone]   Fax:  [phone]  Physical Therapy Treatment  Patient Details  Name: Larry [name] MRN: [id number] Date of Birth: 12-19-1950 Referring Provider (PT): Dr. Joni Fears   Encounter Date: 11/20/2019  PT End of Session - 11/20/19 1355    Visit Number  9    Number of Visits  12    Date for PT Re-Evaluation  11/28/19    Authorization Type  MCR, BCBS    PT Start Time  1308    PT Stop Time  1352    PT Time Calculation (min)  44 min    Activity Tolerance  Patient tolerated treatment well    Behavior During Therapy  [facility] for tasks assessed/performed       Past Medical History:  Diagnosis Date  . Arthritis    "knees; left shoulder" (04/12/2013)  . Asthma    "as a child" (04/12/2013)  . DDD (degenerative disc disease), cervical   . DDD (degenerative disc disease), lumbar   . Difficult intubation    2012   CERVICAL FUSION   . Headache(784.0)    VERAPAMIL FOR PREVENTION   . 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 [name] that he is a carrier  . 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 History of chicken pox    as an adult  . Hypoglycemia    last episode 1 yr. ago, just gets jittery  . Hypothyroidism   . Lumbar stenosis   . MRSA (methicillin resistant staph aureus) culture positive 09/10/2019   upper arm  . OSA (obstructive sleep apnea)    "haven't wore mask in years; had OR; still snore" (04/12/2013)  . Urinary frequency     Past Surgical History:  Procedure Laterality Date  . BACK SURGERY    . CERVICAL FUSION  2002; 2012  . CYSTECTOMY    . INGUINAL HERNIA REPAIR Bilateral 1997  . KNEE ARTHROSCOPY Left ~ 1997; ~ 2005  . LUMBAR FUSION  2007; 04/11/2013  . PENILE DEBRIDEMENT  ~ 2011   X 3  FOR MRSA INFECTION    . SHOULDER ARTHROSCOPY Left ~ 2003  . TONSILLECTOMY  1990's  . TOTAL KNEE ARTHROPLASTY Right 07/23/2015  . TOTAL KNEE  ARTHROPLASTY Right 07/23/2015   Procedure: TOTAL KNEE ARTHROPLASTY;  Surgeon: Garald Balding, MD;  Location: [facility];  Service: Orthopedics;  Laterality: Right;  . TOTAL KNEE ARTHROPLASTY Left 10/03/2019   Procedure: LEFT TOTAL KNEE ARTHROPLASTY;  Surgeon: Garald Balding, MD;  [facility];  Service: Orthopedics;  Laterality: Left;  . UVULOPALATOPHARYNGOPLASTY (UPPP)/TONSILLECTOMY/SEPTOPLASTY  1990's  . VASECTOMY      There were no vitals filed for this visit.  Subjective Assessment - 11/20/19 1311    Subjective  Dr. Durward Fortes was pleased with progress; feels like he'll be ready to d/c next week.    Patient Stated Goals  recover faster    Currently in Pain?  No/denies    Pain Onset  1 to 4 weeks ago                       [facility] Adult PT Treatment/Exercise - 11/20/19 1313      Knee/Hip Exercises: Stretches   Passive Hamstring Stretch  Left;3 reps;30 seconds    Passive Hamstring Stretch Limitations  supine with strap; overpressure at distal thigh    Quad Stretch  Left;3 reps;30 seconds  Quad Stretch Limitations  prone with strap      Knee/Hip Exercises: Aerobic   Recumbent Bike  L3 10 mins      Knee/Hip Exercises: Machines for Strengthening   Total Gym Leg Press  LLE 100# 3 x 10       Knee/Hip Exercises: Standing   Functional Squat  3 sets;10 reps   10# KB; wide stance   Other Standing Knee Exercises  single limb deadlift with UE support; LLE 10# KB 3x10      Knee/Hip Exercises: Seated   Long Arc Quad  3 sets;Left;10 reps    Long Arc Quad Weight  10 lbs.      Knee/Hip Exercises: Supine   Bridges  Strengthening;2 sets;10 reps    Bridges Limitations  on red physioball with hamstring curl                  PT Long Term Goals - 11/14/19 1059      PT LONG TERM GOAL #1   Title  independent with HEP    Baseline  11/14/2019:  Pt. indicated knowledge of HEP but not 100% consistent with daily plan    Time  8    Period  Weeks    Status   Partially Met    Target Date  11/28/19      PT LONG TERM GOAL #2   Title  improve Lt knee AROM 0-115 for improved function    Time  8    Period  Weeks    Status  Achieved    Target Date  11/28/19      PT LONG TERM GOAL #3   Title  report ability to amb without AD at least 30 min without increase in pain for improved function and preparation for return to work    Baseline  11/14/2019: Independent but c gait deviation    Time  8    Period  Weeks    Status  Partially Met    Target Date  11/28/19      PT LONG TERM GOAL #4   Title  negotiate stairs reciprocally with 1 handrail modified independent for improved function    Baseline  11/14/2019: Able to perform 3-4 steps with handrail, reported he hasn't performed full flight c reciprocal gait pattern.    Time  8    Period  Weeks    Status  Partially Met    Target Date  11/28/19      PT LONG TERM GOAL #5   Title  demonstrate improved strength by performing at least 10 reps of SLR without quad lag    Time  8    Period  Weeks    Status  On-going    Target Date  11/28/19            Plan - 11/20/19 1355    Clinical Impression Statement  Pt progressing well with PT, and feel he will be ready to transition to home and community HEP in the next few sessions.  Will continue to benefit from PT to maximize function and mobility.    Personal Factors and Comorbidities  Comorbidity 3+;Transportation    Comorbidities  hypothyroidism, OA, asthma, back pain s/p lumbar fusion and cervical fusion    Examination-Activity Limitations  Locomotion Level;Transfers;Squat;Stairs;Stand    Examination-Participation Restrictions  Other   occupation   Stability/Clinical Decision Making  Stable/Uncomplicated    Rehab Potential  Good    PT Frequency  2x / week  PT Duration  6 weeks    PT Treatment/Interventions  ADLs/Self Care Home Management;Cryotherapy;Electrical Stimulation;DME Instruction;Moist Heat;Gait training;Stair training;Functional mobility  training;Patient/family education;Therapeutic activities;Therapeutic exercise;Balance training;Neuromuscular re-education;Manual techniques;Scar mobilization;Vasopneumatic Device;Taping;Passive range of motion;Dry needling    PT Next Visit Plan  begin working on transition to home, address any needs/concerns    PT Home Exercise Plan  Access Code: [email or web address]    Consulted and Agree with Plan of Care  Patient       Patient will benefit from skilled therapeutic intervention in order to improve the following deficits and impairments:  Abnormal gait, Pain, Decreased mobility, Decreased range of motion, Decreased balance, Difficulty walking, Increased edema, Decreased strength, Impaired flexibility  Visit Diagnosis: Stiffness of left knee, not elsewhere classified  Acute pain of left knee  Muscle weakness (generalized)  Localized edema     Problem List Patient Active Problem List   Diagnosis Date Noted  . S/P TKR (total knee replacement) 10/10/2019  . S/P TKR (total knee replacement) using cement, left 10/03/2019  . Cellulitis of arm 08/28/2019  . Essential hypertension 02/16/2018  . Sepsis due to skin infection ([place]) 02/16/2018  . Chronic pain 02/16/2018  . Cellulitis of left lower extremity   . Lumbar radiculopathy, chronic 01/24/2018  . Lumbar stenosis with neurogenic claudication 03/23/2017  . Hypothyroidism 07/25/2015  . Primary osteoarthritis of right knee 07/23/2015  . Primary osteoarthritis of knee 07/23/2015      Laureen Abrahams, PT, DPT 11/20/19 1:57 PM    [facility] Physical Therapy [address] [place], [place], [address] Phone: [phone]   Fax:  [phone]  Name: JAHMAR MCKELVY MRN: [id number] Date of Birth: 1951/01/20

## 2019-11-22 ENCOUNTER — Telehealth: Payer: Self-pay | Admitting: Orthopaedic Surgery

## 2019-11-22 ENCOUNTER — Other Ambulatory Visit: Payer: Self-pay

## 2019-11-22 ENCOUNTER — Other Ambulatory Visit: Payer: Self-pay | Admitting: Orthopaedic Surgery

## 2019-11-22 ENCOUNTER — Ambulatory Visit (INDEPENDENT_AMBULATORY_CARE_PROVIDER_SITE_OTHER): Payer: Medicare Other | Admitting: Physical Therapy

## 2019-11-22 DIAGNOSIS — R600 Localized edema: Secondary | ICD-10-CM | POA: Diagnosis not present

## 2019-11-22 DIAGNOSIS — R918 Other nonspecific abnormal finding of lung field: Secondary | ICD-10-CM | POA: Diagnosis not present

## 2019-11-22 DIAGNOSIS — M25562 Pain in left knee: Secondary | ICD-10-CM

## 2019-11-22 DIAGNOSIS — J9589 Other postprocedural complications and disorders of respiratory system, not elsewhere classified: Secondary | ICD-10-CM | POA: Diagnosis not present

## 2019-11-22 DIAGNOSIS — M25662 Stiffness of left knee, not elsewhere classified: Secondary | ICD-10-CM | POA: Diagnosis not present

## 2019-11-22 DIAGNOSIS — J189 Pneumonia, unspecified organism: Secondary | ICD-10-CM | POA: Diagnosis not present

## 2019-11-22 DIAGNOSIS — M6281 Muscle weakness (generalized): Secondary | ICD-10-CM

## 2019-11-22 MED ORDER — HYDROCODONE-ACETAMINOPHEN 5-325 MG PO TABS: 1.0000 | ORAL_TABLET | Freq: Four times a day (QID) | ORAL | 0 refills | Status: DC | PRN

## 2019-11-22 NOTE — Telephone Encounter (Signed)
Notified patient.

## 2019-11-22 NOTE — Therapy (Signed)
[facility] Physical Therapy [address] [place], [place], [address] Phone: [phone]   Fax:  [phone]  Physical Therapy Treatment  Patient Details  Name: Larry [name] MRN: [id number] Date of Birth: November 02, 1950 Referring Provider (PT): Dr. Joni Fears   Encounter Date: 11/22/2019  PT End of Session - 11/22/19 1426    Visit Number  10    Number of Visits  12    Date for PT Re-Evaluation  11/28/19    Authorization Type  MCR, BCBS    PT Start Time  1402    PT Stop Time  1455    PT Time Calculation (min)  53 min    Activity Tolerance  Patient tolerated treatment well    Behavior During Therapy  [facility] for tasks assessed/performed       Past Medical History:  Diagnosis Date  . Arthritis    "knees; left shoulder" (04/12/2013)  . Asthma    "as a child" (04/12/2013)  . DDD (degenerative disc disease), cervical   . DDD (degenerative disc disease), lumbar   . Difficult intubation    2012   CERVICAL FUSION   . Headache(784.0)    VERAPAMIL FOR PREVENTION   . 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 History of chicken pox    as an adult  . Hypoglycemia    last episode 1 yr. ago, just gets jittery  . Hypothyroidism   . Lumbar stenosis   . MRSA (methicillin resistant staph aureus) culture positive 09/10/2019   upper arm  . OSA (obstructive sleep apnea)    "haven't wore mask in years; had OR; still snore" (04/12/2013)  . Urinary frequency     Past Surgical History:  Procedure Laterality Date  . BACK SURGERY    . CERVICAL FUSION  2002; 2012  . CYSTECTOMY    . INGUINAL HERNIA REPAIR Bilateral 1997  . KNEE ARTHROSCOPY Left ~ 1997; ~ 2005  . LUMBAR FUSION  2007; 04/11/2013  . PENILE DEBRIDEMENT  ~ 2011   X 3  FOR MRSA INFECTION    . SHOULDER ARTHROSCOPY Left ~ 2003  . TONSILLECTOMY  1990's  . TOTAL KNEE ARTHROPLASTY Right 07/23/2015  . TOTAL KNEE  ARTHROPLASTY Right 07/23/2015   Procedure: TOTAL KNEE ARTHROPLASTY;  Surgeon: Garald Balding, MD;  Location: [place];  Service: Orthopedics;  Laterality: Right;  . TOTAL KNEE ARTHROPLASTY Left 10/03/2019   Procedure: LEFT TOTAL KNEE ARTHROPLASTY;  Surgeon: Garald Balding, MD;  Location: [facility];  Service: Orthopedics;  Laterality: Left;  . UVULOPALATOPHARYNGOPLASTY (UPPP)/TONSILLECTOMY/SEPTOPLASTY  1990's  . VASECTOMY      There were no vitals filed for this visit.  Subjective Assessment - 11/22/19 1425    Subjective  Relays a litte achy in his Lt knee but overall doing well and feels ready to DC next week    Limitations  Standing;Walking    Patient Stated Goals  recover faster    Pain Onset  1 to 4 weeks ago         [facility] Adult PT Treatment/Exercise - 11/22/19 0001      Knee/Hip Exercises: Stretches   Passive Hamstring Stretch  Left;3 reps;30 seconds    Passive Hamstring Stretch Limitations  supine with strap; overpressure at distal thigh    Quad Stretch  Left;3 reps;30 seconds    Quad Stretch Limitations  prone with strap    Other Knee/Hip  Stretches  knee extension heel prop 5 min with gameready    Other Knee/Hip Stretches  heelslides AAROM 10 sec hold X 10 reps      Knee/Hip Exercises: Aerobic   Recumbent Bike  L3 10 mins      Knee/Hip Exercises: Machines for Strengthening   Total Gym Leg Press  LLE 100# 3 x 10       Knee/Hip Exercises: Standing   Functional Squat  3 sets;10 reps    Other Standing Knee Exercises  deadlift 20 lb weight from 6 inch step 3X10      Knee/Hip Exercises: Seated   Long Arc Quad  3 sets;Left;10 reps    Long Arc Quad Weight  10 lbs.      Knee/Hip Exercises: Supine   Bridges  Strengthening;2 sets;10 reps    Bridges Limitations  on red physioball with hamstring curl      Vasopneumatic   Number Minutes Vasopneumatic   10 minutes    Vasopnuematic Location   Knee    Vasopneumatic Pressure  Medium    Vasopneumatic Temperature   34                   PT Long Term Goals - 11/14/19 1059      PT LONG TERM GOAL #1   Title  independent with HEP    Baseline  11/14/2019:  Pt. indicated knowledge of HEP but not 100% consistent with daily plan    Time  8    Period  Weeks    Status  Partially Met    Target Date  11/28/19      PT LONG TERM GOAL #2   Title  improve Lt knee AROM 0-115 for improved function    Time  8    Period  Weeks    Status  Achieved    Target Date  11/28/19      PT LONG TERM GOAL #3   Title  report ability to amb without AD at least 30 min without increase in pain for improved function and preparation for return to work    Baseline  11/14/2019: Independent but c gait deviation    Time  8    Period  Weeks    Status  Partially Met    Target Date  11/28/19      PT LONG TERM GOAL #4   Title  negotiate stairs reciprocally with 1 handrail modified independent for improved function    Baseline  11/14/2019: Able to perform 3-4 steps with handrail, reported he hasn't performed full flight c reciprocal gait pattern.    Time  8    Period  Weeks    Status  Partially Met    Target Date  11/28/19      PT LONG TERM GOAL #5   Title  demonstrate improved strength by performing at least 10 reps of SLR without quad lag    Time  8    Period  Weeks    Status  On-going    Target Date  11/28/19            Plan - 11/22/19 1449    Clinical Impression Statement  Pt has done excellent with PT. Has good strength and ROM and little pain. He does still lack minor endurance and gets out of breath as he still is battling PNA. He will come 2 more visits then discharge to HEP    Personal Factors and Comorbidities  Comorbidity 3+;Transportation  Comorbidities  hypothyroidism, OA, asthma, back pain s/p lumbar fusion and cervical fusion    Examination-Activity Limitations  Locomotion Level;Transfers;Squat;Stairs;Stand    Examination-Participation Restrictions  Other   occupation   Stability/Clinical Decision  Making  Stable/Uncomplicated    Rehab Potential  Good    PT Frequency  2x / week    PT Duration  6 weeks    PT Treatment/Interventions  ADLs/Self Care Home Management;Cryotherapy;Electrical Stimulation;DME Instruction;Moist Heat;Gait training;Stair training;Functional mobility training;Patient/family education;Therapeutic activities;Therapeutic exercise;Balance training;Neuromuscular re-education;Manual techniques;Scar mobilization;Vasopneumatic Device;Taping;Passive range of motion;Dry needling    PT Next Visit Plan  begin working on transition to home, address any needs/concerns    PT Home Exercise Plan  Access Code: [email or web address]    Consulted and Agree with Plan of Care  Patient       Patient will benefit from skilled therapeutic intervention in order to improve the following deficits and impairments:  Abnormal gait, Pain, Decreased mobility, Decreased range of motion, Decreased balance, Difficulty walking, Increased edema, Decreased strength, Impaired flexibility  Visit Diagnosis: Stiffness of left knee, not elsewhere classified  Acute pain of left knee  Muscle weakness (generalized)  Localized edema     Problem List Patient Active Problem List   Diagnosis Date Noted  . S/P TKR (total knee replacement) 10/10/2019  . S/P TKR (total knee replacement) using cement, left 10/03/2019  . Cellulitis of arm 08/28/2019  . Essential hypertension 02/16/2018  . Sepsis due to skin infection ([place]) 02/16/2018  . Chronic pain 02/16/2018  . Cellulitis of left lower extremity   . Lumbar radiculopathy, chronic 01/24/2018  . Lumbar stenosis with neurogenic claudication 03/23/2017  . Hypothyroidism 07/25/2015  . Primary osteoarthritis of right knee 07/23/2015  . Primary osteoarthritis of knee 07/23/2015    Debbe Odea, PT,DPT 11/22/2019, 3:46 PM  [facility] Physical Therapy [address] [place], [place], [address] Phone: [phone]   Fax:  [phone]  Name: NEVYN BOSSMAN MRN: [id number] Date of Birth: [birth year]

## 2019-11-22 NOTE — Telephone Encounter (Signed)
Patient would like a refill on hydrocodone. He is here for PT today.

## 2019-11-27 ENCOUNTER — Encounter: Payer: Self-pay | Admitting: Physical Therapy

## 2019-11-27 ENCOUNTER — Ambulatory Visit (INDEPENDENT_AMBULATORY_CARE_PROVIDER_SITE_OTHER): Payer: Medicare Other | Admitting: Physical Therapy

## 2019-11-27 ENCOUNTER — Other Ambulatory Visit: Payer: Self-pay

## 2019-11-27 DIAGNOSIS — M25562 Pain in left knee: Secondary | ICD-10-CM | POA: Diagnosis not present

## 2019-11-27 DIAGNOSIS — M6281 Muscle weakness (generalized): Secondary | ICD-10-CM

## 2019-11-27 DIAGNOSIS — R600 Localized edema: Secondary | ICD-10-CM | POA: Diagnosis not present

## 2019-11-27 DIAGNOSIS — M25662 Stiffness of left knee, not elsewhere classified: Secondary | ICD-10-CM

## 2019-11-27 NOTE — Patient Instructions (Signed)
Access Code: [email or web address]  URL: [email or web address]  Date: 11/27/2019  Prepared by: Faustino Congress   Exercises Mini Squat with Counter Support - 10 reps - 3 sets - 10 sec hold - 1x daily - 7x weekly Deadlift with Resistance - 10 reps - 3 sets - 1x daily - 7x weekly Standing Single Leg Stance with Unilateral Counter Support - 3 reps - 1 sets - 10 sec hold - 1x daily - 7x weekly Forward Step Up with Counter Support - 10 reps - 3 sets - 1x daily                            - 7x weekly Supine Hamstring Stretch with Strap - 3 reps - 1 sets - 30 sec hold - 1x daily - 7x weekly Prone Quadriceps Stretch with Strap - 3 reps - 1 sets - 30 sec hold - 1x daily - 7x weekly

## 2019-11-27 NOTE — Therapy (Signed)
[facility] Physical Therapy [address] [place], [place], [address] Phone: [phone]   Fax:  [phone]  Physical Therapy Treatment/Discharge Summary  Patient Details  Name: Larry [name] MRN: [id number] Date of Birth: 1951-03-15 Referring Provider (PT): Dr. Joni Fears   Encounter Date: 11/27/2019  PT End of Session - 11/27/19 1352    Visit Number  11    Number of Visits  12    Date for PT Re-Evaluation  11/28/19    Authorization Type  MCR, BCBS    PT Start Time  1315    PT Stop Time  1349    PT Time Calculation (min)  34 min    Activity Tolerance  Patient tolerated treatment well    Behavior During Therapy  [facility] for tasks assessed/performed       Past Medical History:  Diagnosis Date  . Arthritis    "knees; left shoulder" (04/12/2013)  . Asthma    "as a child" (04/12/2013)  . DDD (degenerative disc disease), cervical   . DDD (degenerative disc disease), lumbar   . Difficult intubation    2012   CERVICAL FUSION   . Headache(784.0)    VERAPAMIL FOR PREVENTION   . 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 History of chicken pox    as an adult  . Hypoglycemia    last episode 1 yr. ago, just gets jittery  . Hypothyroidism   . Lumbar stenosis   . MRSA (methicillin resistant staph aureus) culture positive 09/10/2019   upper arm  . OSA (obstructive sleep apnea)    "haven't wore mask in years; had OR; still snore" (04/12/2013)  . Urinary frequency     Past Surgical History:  Procedure Laterality Date  . BACK SURGERY    . CERVICAL FUSION  2002; 2012  . CYSTECTOMY    . INGUINAL HERNIA REPAIR Bilateral 1997  . KNEE ARTHROSCOPY Left ~ 1997; ~ 2005  . LUMBAR FUSION  2007; 04/11/2013  . PENILE DEBRIDEMENT  ~ 2011   X 3  FOR MRSA INFECTION    . SHOULDER ARTHROSCOPY Left ~ 2003  . TONSILLECTOMY  1990's  . TOTAL KNEE ARTHROPLASTY Right 07/23/2015  .  TOTAL KNEE ARTHROPLASTY Right 07/23/2015   Procedure: TOTAL KNEE ARTHROPLASTY;  Surgeon: Garald Balding, MD;  Location: [place];  Service: Orthopedics;  Laterality: Right;  . TOTAL KNEE ARTHROPLASTY Left 10/03/2019   Procedure: LEFT TOTAL KNEE ARTHROPLASTY;  Surgeon: Garald Balding, MD;  Location: [facility];  Service: Orthopedics;  Laterality: Left;  . UVULOPALATOPHARYNGOPLASTY (UPPP)/TONSILLECTOMY/SEPTOPLASTY  1990's  . VASECTOMY      There were no vitals filed for this visit.  Subjective Assessment - 11/27/19 1317    Subjective  doing well; "this is your last week with me"    Limitations  Standing;Walking    Patient Stated Goals  recover faster    Currently in Pain?  No/denies    Pain Onset  1 to 4 weeks ago                       [facility] Adult PT Treatment/Exercise - 11/27/19 1318      Ambulation/Gait   Stairs  Yes    Stairs Assistance  7: Independent    Stair Management Technique  No rails;Alternating pattern    Number of Stairs  8    Height of Stairs  8      Knee/Hip Exercises: Stretches   Passive Hamstring Stretch  Left;3 reps;30 seconds    Passive Hamstring Stretch Limitations  supine with strap; overpressure at distal thigh    Quad Stretch  Left;3 reps;30 seconds    Quad Stretch Limitations  prone with strap      Knee/Hip Exercises: Aerobic   Recumbent Bike  L3 10 mins      Knee/Hip Exercises: Standing   Forward Step Up  Both;3 sets;10 reps;Step Height: 6"    Functional Squat  3 sets;10 reps    SLS  Lt 15 sec hold X 5 reps    Other Standing Knee Exercises  deadlifts with L5 band 3x10      Knee/Hip Exercises: Supine   Straight Leg Raises  10 reps;1 set    Straight Leg Raises Limitations  no quad lag present             PT Education - 11/27/19 1354    Education Details  updated HEP    Person(s) Educated  Patient    Methods  Explanation;Demonstration;Handout    Comprehension  Verbalized understanding;Returned demonstration          PT  Long Term Goals - 11/27/19 1352      PT LONG TERM GOAL #1   Title  independent with HEP    Time  8    Period  Weeks    Status  Achieved      PT LONG TERM GOAL #2   Title  improve Lt knee AROM 0-115 for improved function    Time  8    Period  Weeks    Status  Achieved      PT LONG TERM GOAL #3   Title  report ability to amb without AD at least 30 min without increase in pain for improved function and preparation for return to work    Time  8    Period  Weeks    Status  Achieved      PT LONG TERM GOAL #4   Title  negotiate stairs reciprocally with 1 handrail modified independent for improved function    Time  8    Period  Weeks    Status  Achieved      PT LONG TERM GOAL #5   Title  demonstrate improved strength by performing at least 10 reps of SLR without quad lag    Time  8    Period  Weeks    Status  Achieved            Plan - 11/27/19 1353    Clinical Impression Statement  Pt has met all goals and feels ready for d/c from PT.  Will d/c PT today.    Personal Factors and Comorbidities  Comorbidity 3+;Transportation    Comorbidities  hypothyroidism, OA, asthma, back pain s/p lumbar fusion and cervical fusion    Examination-Activity Limitations  Locomotion Level;Transfers;Squat;Stairs;Stand    Examination-Participation Restrictions  Other   occupation   Stability/Clinical Decision Making  Stable/Uncomplicated    Rehab Potential  Good    PT Frequency  2x / week    PT Duration  6 weeks    PT Treatment/Interventions  ADLs/Self Care Home Management;Cryotherapy;Electrical Stimulation;DME Instruction;Moist Heat;Gait training;Stair training;Functional mobility training;Patient/family education;Therapeutic activities;Therapeutic exercise;Balance training;Neuromuscular re-education;Manual techniques;Scar mobilization;Vasopneumatic Device;Taping;Passive range of motion;Dry needling    PT Next Visit Plan  d/c PT today    PT Home Exercise Plan  Access Code: [email or web address]  Consulted and Agree with Plan of Care  Patient       Patient will benefit from skilled therapeutic intervention in order to improve the following deficits and impairments:  Abnormal gait, Pain, Decreased mobility, Decreased range of motion, Decreased balance, Difficulty walking, Increased edema, Decreased strength, Impaired flexibility  Visit Diagnosis: Stiffness of left knee, not elsewhere classified  Acute pain of left knee  Muscle weakness (generalized)  Localized edema     Problem List Patient Active Problem List   Diagnosis Date Noted  . S/P TKR (total knee replacement) 10/10/2019  . S/P TKR (total knee replacement) using cement, left 10/03/2019  . Cellulitis of arm 08/28/2019  . Essential hypertension 02/16/2018  . Sepsis due to skin infection ([place]) 02/16/2018  . Chronic pain 02/16/2018  . Cellulitis of left lower extremity   . Lumbar radiculopathy, chronic 01/24/2018  . Lumbar stenosis with neurogenic claudication 03/23/2017  . Hypothyroidism 07/25/2015  . Primary osteoarthritis of right knee 07/23/2015  . Primary osteoarthritis of knee 07/23/2015      Laureen Abrahams, PT, DPT 11/27/19 1:55 PM    [facility] [address] [place], [place], [address] Phone: [phone]   Fax:  [phone]  Name: GENNIE DIB MRN: [id number] Date of Birth: 11-01-1950     PHYSICAL THERAPY DISCHARGE SUMMARY  Visits from Start of Care: 11  Current functional level related to goals / functional outcomes: See above   Remaining deficits: See above   Education / Equipment: HEP  Plan: Patient agrees to discharge.  Patient goals were met. Patient is being discharged due to meeting the stated rehab goals.  ?????    Laureen Abrahams, PT, DPT 11/27/19 1:55 PM  [facility] [address] [place], [place], [address] Phone: [phone]   Fax:  [phone]

## 2019-11-29 ENCOUNTER — Encounter: Payer: Medicare Other | Admitting: Physical Therapy

## 2019-12-01 ENCOUNTER — Telehealth: Payer: Self-pay | Admitting: *Deleted

## 2019-12-01 NOTE — Telephone Encounter (Signed)
Patient called today requesting a return to work note for 12/18/19 if agreeable to you. [employer] will need this sent to [facility]. He has requested this be faxed and emailed if possible. I can help if needed. Claim# [id number] needs to be on the letter. Email: [email or web address]; fax# [phone]. He would like it to say he may return to his regular duties/hours with accommodating his morning and afternoon breaks and lunch if agreeable. Thanks-

## 2019-12-01 NOTE — Telephone Encounter (Signed)
Larry [name] remind me on Tues and I will have Larry [name] write the  note

## 2019-12-05 ENCOUNTER — Ambulatory Visit: Payer: Medicare Other | Attending: Internal Medicine

## 2019-12-05 DIAGNOSIS — H40012 Open angle with borderline findings, low risk, left eye: Secondary | ICD-10-CM | POA: Diagnosis not present

## 2019-12-05 DIAGNOSIS — Z23 Encounter for immunization: Secondary | ICD-10-CM

## 2019-12-05 NOTE — Telephone Encounter (Signed)
Faxed to [facility] and also left letter up front for patient to pick up.

## 2019-12-05 NOTE — Telephone Encounter (Signed)
Please advise on what note needs to say

## 2019-12-05 NOTE — Telephone Encounter (Signed)
Call Larry [name] and ask what he needs specifically in the note to return to work-thanks

## 2019-12-05 NOTE — Progress Notes (Signed)
   Covid-19 Vaccination Clinic  Name:  Larry [name]    MRN: [id number] DOB: [birth year]  12/05/2019  [name] was observed post Covid-19 immunization for 15 minutes without incident. He was provided with Vaccine Information Sheet and instruction to access the V-Safe system.   [name] was instructed to call 911 with any severe reactions post vaccine: Marland Kitchen Difficulty breathing  . Swelling of face and throat  . A fast heartbeat  . A bad rash all over body  . Dizziness and weakness   Immunizations Administered    Name Date Dose VIS Date Route   Pfizer COVID-19 Vaccine 12/05/2019  9:50 AM 0.3 mL 09/08/2019 Intramuscular   Manufacturer: [place]   Lot: [id number]   [place]: [id number]

## 2019-12-13 DIAGNOSIS — Z79899 Other long term (current) drug therapy: Secondary | ICD-10-CM | POA: Diagnosis not present

## 2019-12-13 DIAGNOSIS — R201 Hypoesthesia of skin: Secondary | ICD-10-CM | POA: Diagnosis not present

## 2019-12-13 DIAGNOSIS — M79671 Pain in right foot: Secondary | ICD-10-CM | POA: Diagnosis not present

## 2019-12-13 DIAGNOSIS — R202 Paresthesia of skin: Secondary | ICD-10-CM | POA: Diagnosis not present

## 2019-12-13 DIAGNOSIS — M545 Low back pain: Secondary | ICD-10-CM | POA: Diagnosis not present

## 2019-12-15 DIAGNOSIS — I10 Essential (primary) hypertension: Secondary | ICD-10-CM | POA: Diagnosis not present

## 2019-12-15 DIAGNOSIS — R972 Elevated prostate specific antigen [PSA]: Secondary | ICD-10-CM | POA: Diagnosis not present

## 2019-12-15 DIAGNOSIS — Z125 Encounter for screening for malignant neoplasm of prostate: Secondary | ICD-10-CM | POA: Diagnosis not present

## 2019-12-15 DIAGNOSIS — J189 Pneumonia, unspecified organism: Secondary | ICD-10-CM | POA: Diagnosis not present

## 2019-12-15 DIAGNOSIS — E785 Hyperlipidemia, unspecified: Secondary | ICD-10-CM | POA: Diagnosis not present

## 2019-12-15 DIAGNOSIS — Z79899 Other long term (current) drug therapy: Secondary | ICD-10-CM | POA: Diagnosis not present

## 2019-12-15 DIAGNOSIS — E039 Hypothyroidism, unspecified: Secondary | ICD-10-CM | POA: Diagnosis not present

## 2019-12-15 DIAGNOSIS — R918 Other nonspecific abnormal finding of lung field: Secondary | ICD-10-CM | POA: Diagnosis not present

## 2019-12-15 IMAGING — MR MR HUMERUS*L* W/O CM
9 series · 40 of 40 positions shown · non-contrast
Comparison: None.

CLINICAL DATA: Cellulitis, question osteomyelitis

EXAM:
MRI OF THE LEFT HUMERUS WITHOUT CONTRAST
TECHNIQUE: Multiplanar, multisequence MR imaging of the left forearm was
performed. No intravenous contrast was administered.

[Series 11: T2 fat-sat · axial · left · 5.0mm · 0.62mm/px · z∈[-426,-130]mm · 5 of 60 slices shown (1 of 2)]
[im 1/60]
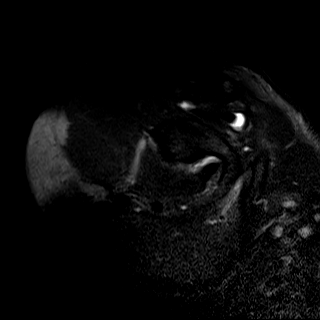
[im 15/60]
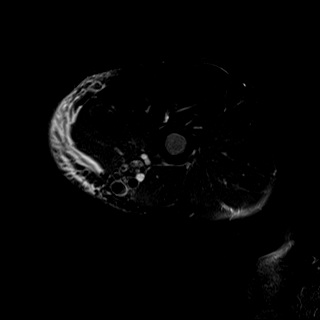
[im 30/60]
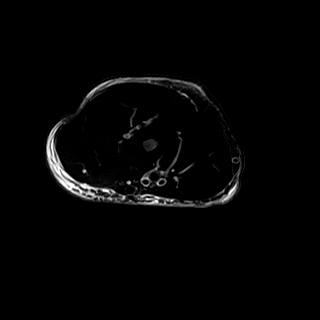
[im 45/60]
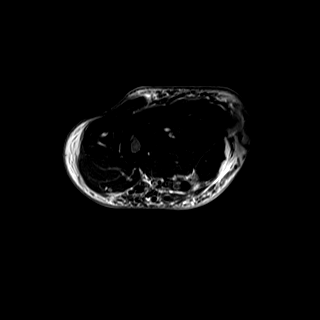
[im 60/60]
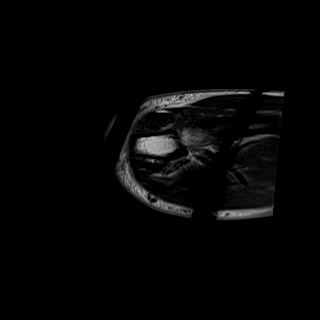

[Series 12: T1 · axial · left · 5.0mm · 0.78mm/px · z∈[-426,-130]mm · 5 of 60 slices shown (1 of 4)]
[im 1/60]
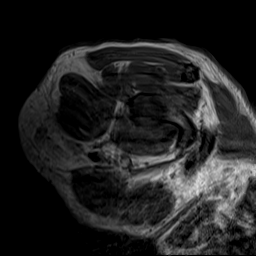
[im 15/60]
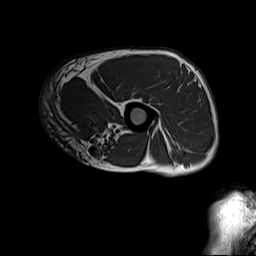
[im 30/60]
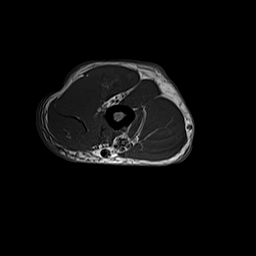
[im 45/60]
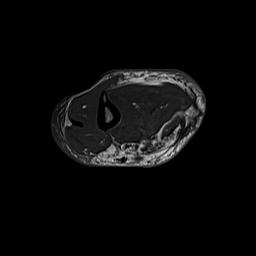
[im 60/60]
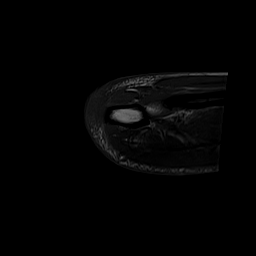

[Series 13: T1 fat-sat · axial · left · 5.0mm · 0.78mm/px · z∈[-426,-130]mm · 6 of 60 slices shown]
[im 1/60]
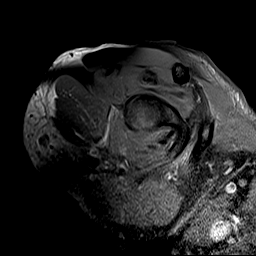
[im 12/60]
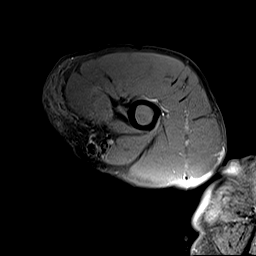
[im 24/60]
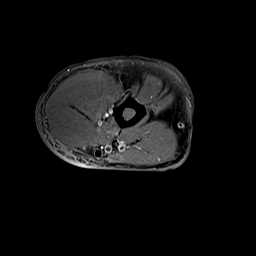
[im 36/60]
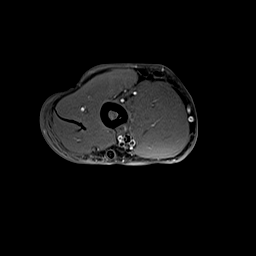
[im 48/60]
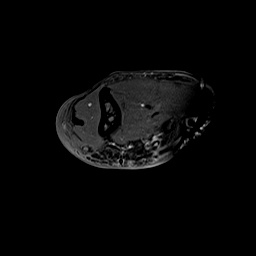
[im 60/60]
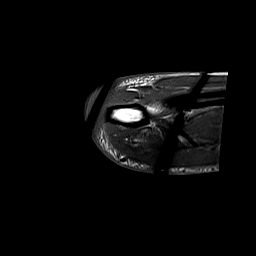

[Series 14: T1 · coronal · left · 4.0mm · 1.22mm/px · 4 of 36 slices shown (2 of 4)]
[im 1/36]
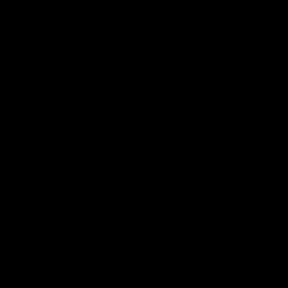
[im 12/36]
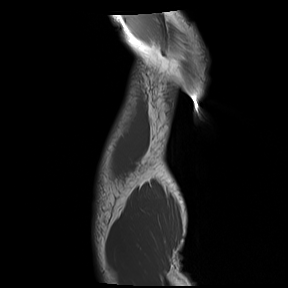
[im 24/36]
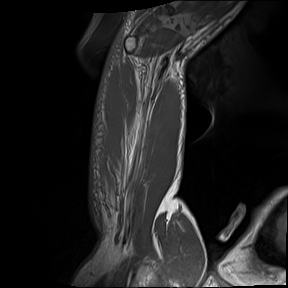
[im 36/36]
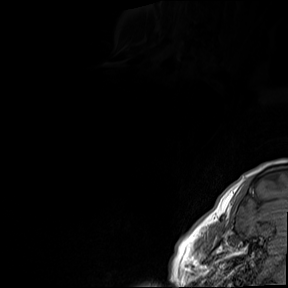

[Series 15: STIR · coronal · left · 4.0mm · 1.04mm/px · 4 of 36 slices shown (1 of 2)]
[im 1/36]
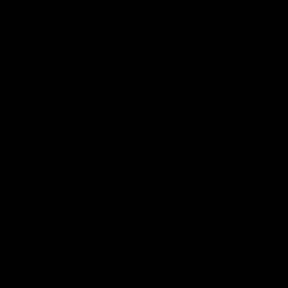
[im 12/36]
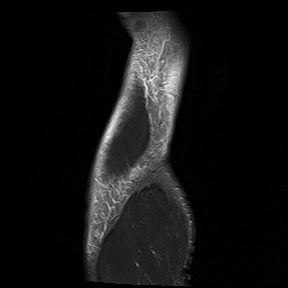
[im 24/36]
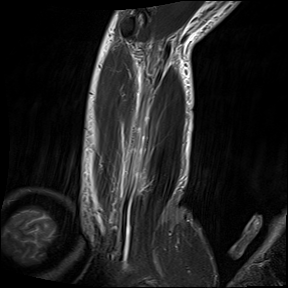
[im 36/36]
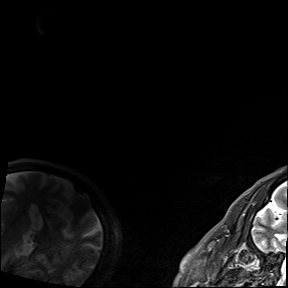

[Series 16: T2 fat-sat · sagittal · left · 4.0mm · 1.15mm/px · 4 of 40 slices shown (2 of 2)]
[im 1/40]
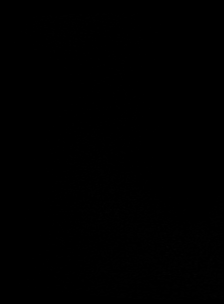
[im 14/40]
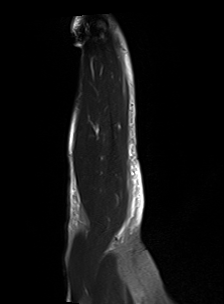
[im 27/40]
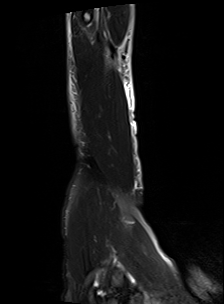
[im 40/40]
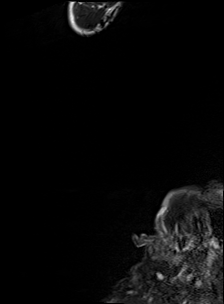

[Series 17: T1 · sagittal · left · 4.0mm · 1.22mm/px · 4 of 40 slices shown (3 of 4)]
[im 1/40]
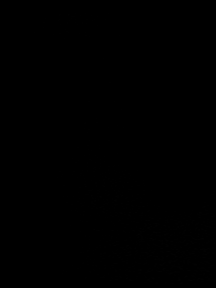
[im 14/40]
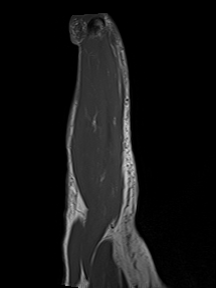
[im 27/40]
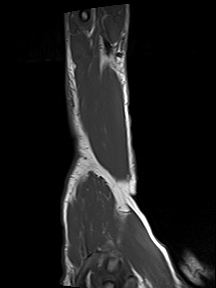
[im 40/40]
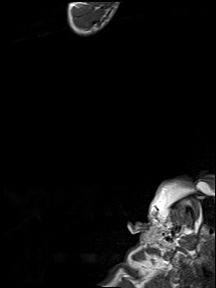

[Series 18: STIR · coronal · left · 4.0mm · 1.39mm/px · 4 of 36 slices shown (2 of 2)]
[im 1/36]
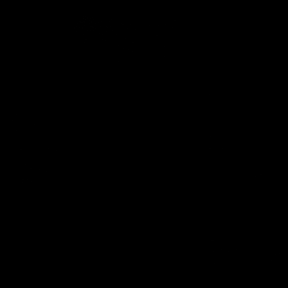
[im 12/36]
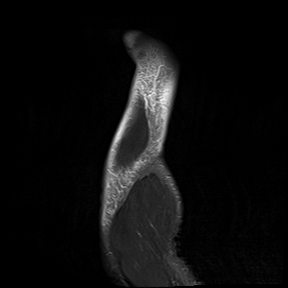
[im 24/36]
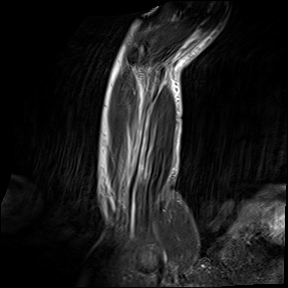
[im 36/36]
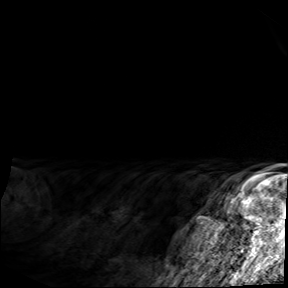

[Series 19: T1 · coronal · left · 4.0mm · 1.39mm/px · 4 of 36 slices shown (4 of 4)]
[im 1/36]
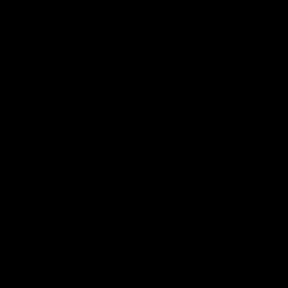
[im 12/36]
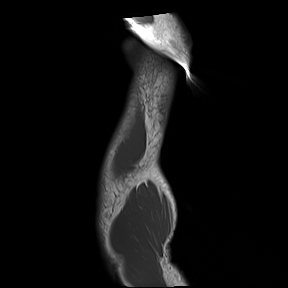
[im 24/36]
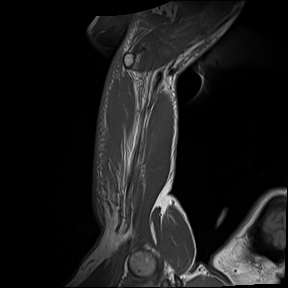
[im 36/36]
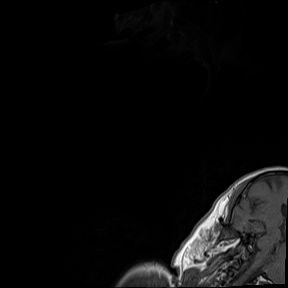

[40 of 40 positions shown; findings below may reference images not displayed]

FINDINGS: Bones/Joint/Cartilage

Normal osseous marrow signal seen throughout. No osseous fracture,
pathologic marrow infiltration, cortical destruction or periosteal
reaction. The joint spaces appear to be well maintained. There is a
trace glenohumeral joint effusion.

Ligaments

Suboptimally visualized

Muscles and Tendons

Normal appearance of the muscles. No evidence of muscular atrophy or
tear. The tendons appear to be intact. There is a small amount of
fluid seen surrounding the biceps tendon.

Soft tissues

There is diffuse subcutaneous edema seen along the lateral aspect of
the humerus extending posteriorly at the level of the elbow. No
loculated fluid collection is noted. Small probable lymph nodes are
seen along the posterior aspect of the shoulder. There is diffuse
skin thickening.
IMPRESSION: Findings suggestive of cellulitis as described above. No evidence of
osteomyelitis or abscess.

## 2019-12-25 DIAGNOSIS — G8929 Other chronic pain: Secondary | ICD-10-CM | POA: Diagnosis not present

## 2019-12-25 DIAGNOSIS — E039 Hypothyroidism, unspecified: Secondary | ICD-10-CM | POA: Diagnosis not present

## 2019-12-25 DIAGNOSIS — E291 Testicular hypofunction: Secondary | ICD-10-CM | POA: Diagnosis not present

## 2019-12-25 DIAGNOSIS — I10 Essential (primary) hypertension: Secondary | ICD-10-CM | POA: Diagnosis not present

## 2019-12-25 DIAGNOSIS — J45909 Unspecified asthma, uncomplicated: Secondary | ICD-10-CM | POA: Diagnosis not present

## 2019-12-25 DIAGNOSIS — N401 Enlarged prostate with lower urinary tract symptoms: Secondary | ICD-10-CM | POA: Diagnosis not present

## 2019-12-25 DIAGNOSIS — M150 Primary generalized (osteo)arthritis: Secondary | ICD-10-CM | POA: Diagnosis not present

## 2019-12-25 DIAGNOSIS — F419 Anxiety disorder, unspecified: Secondary | ICD-10-CM | POA: Diagnosis not present

## 2019-12-25 DIAGNOSIS — Z79899 Other long term (current) drug therapy: Secondary | ICD-10-CM | POA: Diagnosis not present

## 2019-12-25 DIAGNOSIS — E785 Hyperlipidemia, unspecified: Secondary | ICD-10-CM | POA: Diagnosis not present

## 2020-01-01 ENCOUNTER — Telehealth: Payer: Self-pay | Admitting: *Deleted

## 2020-01-01 NOTE — Telephone Encounter (Signed)
90 day Ortho bundle call completed. 

## 2020-01-01 NOTE — Telephone Encounter (Signed)
Ortho bundle 90 day call completed. 

## 2020-01-10 ENCOUNTER — Telehealth: Payer: Self-pay | Admitting: Orthopaedic Surgery

## 2020-01-10 NOTE — Telephone Encounter (Signed)
Patient has had a knee replacement that involved insertion of metal and plastic.

## 2020-01-10 NOTE — Telephone Encounter (Signed)
Spoke with patient and left note up front for pick up.  

## 2020-01-10 NOTE — Telephone Encounter (Signed)
Patient called requesting a letter stating metal is present in knee from knee replacement. Patient states needed for metal detectors to present for travel. Patient phone number is [phone].

## 2020-01-22 DIAGNOSIS — N5201 Erectile dysfunction due to arterial insufficiency: Secondary | ICD-10-CM | POA: Diagnosis not present

## 2020-02-08 ENCOUNTER — Encounter: Payer: Self-pay | Admitting: Orthopaedic Surgery

## 2020-02-08 ENCOUNTER — Ambulatory Visit (INDEPENDENT_AMBULATORY_CARE_PROVIDER_SITE_OTHER): Payer: Medicare Other | Admitting: Orthopaedic Surgery

## 2020-02-08 ENCOUNTER — Other Ambulatory Visit: Payer: Self-pay

## 2020-02-08 DIAGNOSIS — M7542 Impingement syndrome of left shoulder: Secondary | ICD-10-CM

## 2020-02-08 DIAGNOSIS — Z96652 Presence of left artificial knee joint: Secondary | ICD-10-CM

## 2020-02-08 MED ORDER — BUPIVACAINE HCL 0.5 % IJ SOLN
2.0000 mL | INTRAMUSCULAR | Status: AC | PRN
  Administered 2020-02-08: 2 mL via INTRA_ARTICULAR

## 2020-02-08 MED ORDER — LIDOCAINE HCL 2 % IJ SOLN
2.0000 mL | INTRAMUSCULAR | Status: AC | PRN
  Administered 2020-02-08: 2 mL

## 2020-02-08 MED ORDER — METHYLPREDNISOLONE ACETATE 40 MG/ML IJ SUSP
80.0000 mg | INTRAMUSCULAR | Status: AC | PRN
  Administered 2020-02-08: 80 mg via INTRA_ARTICULAR

## 2020-02-08 NOTE — Progress Notes (Signed)
Office Visit Note   Patient: Larry [name]           Date of Birth: [birth year]           MRN: [id number] Visit Date: 02/08/2020              Requested by: Larry [name], [place] [address] [place],  [place] [address] PCP: Larry Seashore, MD   Assessment & Plan: Visit Diagnoses:  1. Status post total left knee replacement   2. Impingement syndrome of left shoulder     Plan: Larry [name] is just over 4 months status post primary left total knee replacement and very happy with the results.  He has not taken any pain medicines for months.  He is back working and driving and performing activities daily living without any issues.  He denies any fever or chills.  He recently developed anterior subacromial pain of his left shoulder after lifting weights.  He does have mild impingement testing.  He has had prior arthroscopic debridement of one of his shoulder she is not sure.  Going to inject the subacromial space left shoulde with Depo-Medrol, Xylocaine and Marcaine.  Monitor results.  See him back in 6 months or sooner if necessary  Follow-Up Instructions: Return in about 6 months (around 08/10/2020).   Orders:  No orders of the defined types were placed in this encounter.  No orders of the defined types were placed in this encounter.     Procedures: Large Joint Inj: L subacromial bursa on 02/08/2020 3:59 PM Indications: pain and diagnostic evaluation Details: 25 G 1.5 in needle, anterolateral approach  Arthrogram: No  Medications: 2 mL lidocaine 2 %; 2 mL bupivacaine 0.5 %; 80 mg methylPREDNISolone acetate 40 MG/ML Consent was given by the patient. Immediately prior to procedure a time out was called to verify the correct patient, procedure, equipment, support staff and site/side marked as required. Patient was prepped and draped in the usual sterile fashion.       Clinical Data: No additional findings.   Subjective: Chief Complaint  Patient presents  with  . Left Knee - Follow-up  . Left Shoulder - Follow-up  Just over 4 months status post primary left total knee replacement and doing very well without any issues.  Not taking any pain medicines or using any ambulatory aid.  Back to full activity.  Has developed some pain along the anterior subacromial region of his left shoulder after working with weights and having some discomfort raising his arm over his head.  No numbness or tingling.  He does have chronic back pain and is being followed by Dr. Ellene [name]  HPI  Review of Systems   Objective: Vital Signs: There were no vitals taken for this visit.  Physical Exam Constitutional:      Appearance: He is well-developed.  Eyes:     Pupils: Pupils are equal, round, and reactive to light.  Pulmonary:     Effort: Pulmonary effort is normal.  Skin:    General: Skin is warm and dry.  Neurological:     Mental Status: He is alert and oriented to person, place, and time.  Psychiatric:        Behavior: Behavior normal.     Ortho Exam left knee with full extension and flexion of 110 degrees with a goniometer.  No opening with varus valgus stress.  No localized areas of tenderness.  No effusion.  Negative anterior drawer sign.  No calf pain.  No popliteal  fullness.  No distal edema.  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  Positive impingement testing left shoulder.  Full overhead motion and abduction.  Some mild tenderness in the anterior subacromial region.  No pain at the [facility] joint  Specialty Comments:  No specialty comments available.  Imaging: No results found.   PMFS History: Patient Active Problem List   Diagnosis Date Noted  . Impingement syndrome of left shoulder 02/08/2020  . S/P TKR (total knee replacement) 10/10/2019  . S/P TKR (total knee replacement) using cement, left 10/03/2019  . Cellulitis of arm 08/28/2019  . Essential hypertension 02/16/2018  . Sepsis due to skin infection  ([place]) 02/16/2018  . Chronic pain 02/16/2018  . Cellulitis of left lower extremity   . Lumbar radiculopathy, chronic 01/24/2018  . Lumbar stenosis with neurogenic claudication 03/23/2017  . Hypothyroidism 07/25/2015  . Primary osteoarthritis of right knee 07/23/2015  . Primary osteoarthritis of knee 07/23/2015   Past Medical History:  Diagnosis Date  . Arthritis    "knees; left shoulder" (04/12/2013)  . Asthma    "as a child" (04/12/2013)  . DDD (degenerative disc disease), cervical   . DDD (degenerative disc disease), lumbar   . Difficult intubation    2012   CERVICAL FUSION   . Headache(784.0)    VERAPAMIL FOR PREVENTION   . 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 History of chicken pox    as an adult  . Hypoglycemia    last episode 1 yr. ago, just gets jittery  . Hypothyroidism   . Lumbar stenosis   . MRSA (methicillin resistant staph aureus) culture positive 09/10/2019   upper arm  . OSA (obstructive sleep apnea)    "haven't wore mask in years; had OR; still snore" (04/12/2013)  . Urinary frequency     History reviewed. No pertinent family history.  Past Surgical History:  Procedure Laterality Date  . BACK SURGERY    . CERVICAL FUSION  2002; 2012  . CYSTECTOMY    . INGUINAL HERNIA REPAIR Bilateral 1997  . KNEE ARTHROSCOPY Left ~ 1997; ~ 2005  . LUMBAR FUSION  2007; 04/11/2013  . PENILE DEBRIDEMENT  ~ 2011   X 3  FOR MRSA INFECTION    . SHOULDER ARTHROSCOPY Left ~ 2003  . TONSILLECTOMY  1990's  . TOTAL KNEE ARTHROPLASTY Right 07/23/2015  . TOTAL KNEE ARTHROPLASTY Right 07/23/2015   Procedure: TOTAL KNEE ARTHROPLASTY;  Surgeon: Larry Balding, MD;  Location: [place];  Service: Orthopedics;  Laterality: Right;  . TOTAL KNEE ARTHROPLASTY Left 10/03/2019   Procedure: LEFT TOTAL KNEE ARTHROPLASTY;  Surgeon: Larry Balding, MD;  Location: [facility];  Service: Orthopedics;  Laterality:  Left;  . UVULOPALATOPHARYNGOPLASTY (UPPP)/TONSILLECTOMY/SEPTOPLASTY  1990's  . VASECTOMY     Social History   Occupational History  . Not on file  Tobacco Use  . Smoking status: Former Smoker    Packs/day: 0.50    Years: 15.00    Pack years: 7.50    Types: Cigarettes    Quit date: 10/14/1986    Years since quitting: 33.3  . Smokeless tobacco: Never Used  Substance and Sexual Activity  . Alcohol use: Yes    Comment: occasional  . Drug use: No  . Sexual activity: Yes     Larry Balding, MD   Note - This record  has been created using [employer].  Chart creation errors have been sought, but may not always  have been located. Such creation errors do not reflect on  the standard of medical care.

## 2020-02-13 ENCOUNTER — Ambulatory Visit: Payer: Medicare Other | Admitting: Orthopaedic Surgery

## 2020-05-07 ENCOUNTER — Ambulatory Visit (INDEPENDENT_AMBULATORY_CARE_PROVIDER_SITE_OTHER): Payer: Medicare Other

## 2020-05-07 ENCOUNTER — Encounter: Payer: Self-pay | Admitting: Orthopaedic Surgery

## 2020-05-07 ENCOUNTER — Ambulatory Visit (INDEPENDENT_AMBULATORY_CARE_PROVIDER_SITE_OTHER): Payer: Medicare Other | Admitting: Orthopaedic Surgery

## 2020-05-07 ENCOUNTER — Other Ambulatory Visit: Payer: Self-pay

## 2020-05-07 VITALS — Ht 66.0 in | Wt 173.0 lb

## 2020-05-07 DIAGNOSIS — M19012 Primary osteoarthritis, left shoulder: Secondary | ICD-10-CM

## 2020-05-07 DIAGNOSIS — M79642 Pain in left hand: Secondary | ICD-10-CM

## 2020-05-07 DIAGNOSIS — M189 Osteoarthritis of first carpometacarpal joint, unspecified: Secondary | ICD-10-CM | POA: Diagnosis not present

## 2020-05-07 NOTE — Progress Notes (Signed)
Office Visit Note   Patient: Larry [name]           Date of Birth: 07-16-1951           MRN: [id number] Visit Date: 05/07/2020              Requested by: Merrilee Seashore, [address] [place],  [place] [address] PCP: Merrilee Seashore, MD   Assessment & Plan: Visit Diagnoses:  1. Pain in left hand   2. 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 Osteoarthritis of left AC (acromioclavicular) joint     Plan: #1: Corticosteroid injection was given to the first [facility] joint of the left hand without difficulty.  Improvement in pain. #2: No treatment at this time for the [facility] joint at his request.   Follow-Up Instructions: No follow-ups on file.   Orders:  Orders Placed This Encounter  Procedures  . XR Hand Complete Left   No orders of the defined types were placed in this encounter.     Procedures: No procedures performed   Clinical Data: No additional findings.   Subjective: Chief Complaint  Patient presents with  . Left Hand - Pain   HPI Patient presents today for left hand pain that has been been hurting for 3 weeks. No known injury. His pain is located around the base of his thumb. It is worsening and now hurts almost all the time. No numbness or pain in his hand otherwise. He had EMG studies on his extremities since having neck and lower back surgeries. He was told after those studies that he is developing carpal tunnel bilaterally. He is right hand dominant. He takes meloxicam. He does state that his left hand is weaker.  He also was having some symptoms in his left shoulder at the [facility] joint.  This is under Workmen's Comp. and will be cared for by them.   Review of Systems  Constitutional: Negative for fatigue.  HENT: Negative for ear pain.   Eyes: Negative for pain.  Respiratory: Negative for shortness of breath.   Cardiovascular: Negative for leg swelling.  Gastrointestinal: Negative for constipation and  diarrhea.  Endocrine: Negative for cold intolerance and heat intolerance.  Genitourinary: Negative for difficulty urinating.  Musculoskeletal: Negative for joint swelling.  Skin: Negative for rash.  Allergic/Immunologic: Negative for food allergies.  Neurological: Negative for weakness.  Hematological: Does not bruise/bleed easily.  Psychiatric/Behavioral: Negative for sleep disturbance.     Objective: Vital Signs: Ht 5\' 6"  (1.676 m)   Wt 173 lb (78.5 kg)   BMI 27.92 kg/m   Physical Exam Constitutional:      Appearance: Normal appearance. He is well-developed and normal weight.  HENT:     Head: Normocephalic.  Eyes:     Pupils: Pupils are equal, round, and reactive to light.  Pulmonary:     Effort: Pulmonary effort is normal.  Skin:    General: Skin is warm and dry.  Neurological:     Mental Status: He is alert and oriented to person, place, and time.  Psychiatric:        Mood and Affect: Mood normal.        Behavior: Behavior normal.        Thought Content: Thought content normal.        Judgment: Judgment normal.     Ortho Exam  Exam today of the left wrist reveals tenderness to palpation over the [facility] joint of the left hand.  Positive grind test.  Does have pain with opposition to the little finger.  Good strength though.  Neurovascular intact distally.  Good capillary refill.  Incidental noting that the there is prominence over the [facility] joint of the left shoulder.  Tender at that area also.  Specialty Comments:  No specialty comments available.  Imaging: No results found.   PMFS History: Current Outpatient Medications  Medication Sig Dispense Refill  . aspirin EC 81 MG tablet Take 81 mg by mouth daily.    . furosemide (LASIX) 40 MG tablet Take 40 mg by mouth daily as needed for fluid or edema.     Marland Kitchen HYDROcodone-acetaminophen (NORCO) 5-325 MG tablet Take 1 tablet by mouth every 4 (four) hours as needed for moderate pain. 15 tablet 0  .  HYDROcodone-acetaminophen (NORCO/VICODIN) 5-325 MG tablet Take 1 tablet by mouth every 6 (six) hours as needed for moderate pain. 30 tablet 0  . HYDROcodone-acetaminophen (NORCO/VICODIN) 5-325 MG tablet Take 1 tablet by mouth every 6 (six) hours as needed for moderate pain. 30 tablet 0  . HYDROmorphone (DILAUDID) 2 MG tablet Take 1 tablet (2 mg total) by mouth every 4 (four) hours as needed for severe pain. 30 tablet 0  . HYDROmorphone (DILAUDID) 2 MG tablet Take 1 tablet (2 mg total) by mouth every 4 (four) hours as needed for severe pain. 30 tablet 0  . latanoprost (XALATAN) 0.005 % ophthalmic solution Place 1 drop into both eyes at bedtime.  2  . levothyroxine (SYNTHROID) 200 MCG tablet Take 200 mcg by mouth daily before breakfast.     . LORazepam (ATIVAN) 0.5 MG tablet Take 0.5-1 mg by mouth at bedtime.   1  . losartan (COZAAR) 50 MG tablet Take 50 mg by mouth daily.     . meloxicam (MOBIC) 15 MG tablet Take 15 mg by mouth daily.     . methocarbamol (ROBAXIN) 500 MG tablet Take 1 tablet (500 mg total) by mouth every 8 (eight) hours as needed for muscle spasms. 30 tablet 0  . methocarbamol (ROBAXIN) 500 MG tablet Take 1 tablet (500 mg total) by mouth every 8 (eight) hours as needed for muscle spasms. 30 tablet 0  . Multiple Vitamin (MULTIVITAMIN WITH MINERALS) TABS tablet Take 1 tablet by mouth daily.    . Sildenafil Citrate (VIAGRA PO) Take 30-60 mg by mouth daily as needed (for erectile dysfunction). Purchased from [employer]--dose indicated on website as 30 mg     . tamsulosin (FLOMAX) 0.4 MG CAPS capsule Take 0.4 mg by mouth daily.    Marland Kitchen testosterone enanthate (DELATESTRYL) 200 MG/ML injection Inject 200 mg into the muscle every 14 (fourteen) days. Fridays.    . verapamil (CALAN-SR) 180 MG CR tablet Take 180 mg by mouth daily.  15   No current facility-administered medications for this visit.    Patient Active Problem List   Diagnosis Date Noted  . Osteoarthritis of first  carpometacarpal [facility]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 05/07/2020  . Osteoarthritis of left AC (acromioclavicular) joint 05/07/2020  . Impingement syndrome of left shoulder 02/08/2020  . S/P TKR (total knee replacement) 10/10/2019  . S/P TKR (total knee replacement) using cement, left 10/03/2019  . Cellulitis of arm 08/28/2019  . Essential hypertension 02/16/2018  . Sepsis due to skin infection ([place]) 02/16/2018  . Chronic pain 02/16/2018  . Cellulitis of left lower extremity   . Lumbar radiculopathy, chronic 01/24/2018  . Lumbar stenosis with neurogenic claudication 03/23/2017  . Hypothyroidism 07/25/2015  . Primary osteoarthritis of  right knee 07/23/2015  . Primary osteoarthritis of knee 07/23/2015   Past Medical History:  Diagnosis Date  . Arthritis    "knees; left shoulder" (04/12/2013)  . Asthma    "as a child" (04/12/2013)  . DDD (degenerative disc disease), cervical   . DDD (degenerative disc disease), lumbar   . Difficult intubation    2012   CERVICAL FUSION   . Headache(784.0)    VERAPAMIL FOR PREVENTION   . 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 History of chicken pox    as an adult  . Hypoglycemia    last episode 1 yr. ago, just gets jittery  . Hypothyroidism   . Lumbar stenosis   . MRSA (methicillin resistant staph aureus) culture positive 09/10/2019   upper arm  . OSA (obstructive sleep apnea)    "haven't wore mask in years; had OR; still snore" (04/12/2013)  . Urinary frequency     History reviewed. No pertinent family history.  Past Surgical History:  Procedure Laterality Date  . BACK SURGERY    . CERVICAL FUSION  2002; 2012  . CYSTECTOMY    . INGUINAL HERNIA REPAIR Bilateral 1997  . KNEE ARTHROSCOPY Left ~ 1997; ~ 2005  . LUMBAR FUSION  2007; 04/11/2013  . PENILE DEBRIDEMENT  ~ 2011   X 3  FOR MRSA INFECTION    . SHOULDER ARTHROSCOPY Left ~ 2003  . TONSILLECTOMY  1990's  .  TOTAL KNEE ARTHROPLASTY Right 07/23/2015  . TOTAL KNEE ARTHROPLASTY Right 07/23/2015   Procedure: TOTAL KNEE ARTHROPLASTY;  Surgeon: Garald Balding, MD;  Location: [place];  Service: Orthopedics;  Laterality: Right;  . TOTAL KNEE ARTHROPLASTY Left 10/03/2019   Procedure: LEFT TOTAL KNEE ARTHROPLASTY;  Surgeon: Garald Balding, MD;  [facility];  Service: Orthopedics;  Laterality: Left;  . UVULOPALATOPHARYNGOPLASTY (UPPP)/TONSILLECTOMY/SEPTOPLASTY  1990's  . VASECTOMY     Social History   Occupational History  . Not on file  Tobacco Use  . Smoking status: Former Smoker    Packs/day: 0.50    Years: 15.00    Pack years: 7.50    Types: Cigarettes    Quit date: 10/14/1986    Years since quitting: 33.5  . Smokeless tobacco: Never Used  Vaping Use  . Vaping Use: Never used  Substance and Sexual Activity  . Alcohol use: Yes    Comment: occasional  . Drug use: No  . Sexual activity: Yes

## 2020-05-15 DIAGNOSIS — M542 Cervicalgia: Secondary | ICD-10-CM | POA: Diagnosis not present

## 2020-05-15 DIAGNOSIS — M545 Low back pain: Secondary | ICD-10-CM | POA: Diagnosis not present

## 2020-06-19 DIAGNOSIS — I10 Essential (primary) hypertension: Secondary | ICD-10-CM | POA: Diagnosis not present

## 2020-06-26 DIAGNOSIS — Z23 Encounter for immunization: Secondary | ICD-10-CM | POA: Diagnosis not present

## 2020-06-26 DIAGNOSIS — N401 Enlarged prostate with lower urinary tract symptoms: Secondary | ICD-10-CM | POA: Diagnosis not present

## 2020-06-26 DIAGNOSIS — E039 Hypothyroidism, unspecified: Secondary | ICD-10-CM | POA: Diagnosis not present

## 2020-06-26 DIAGNOSIS — G8929 Other chronic pain: Secondary | ICD-10-CM | POA: Diagnosis not present

## 2020-06-26 DIAGNOSIS — E291 Testicular hypofunction: Secondary | ICD-10-CM | POA: Diagnosis not present

## 2020-06-26 DIAGNOSIS — I10 Essential (primary) hypertension: Secondary | ICD-10-CM | POA: Diagnosis not present

## 2020-06-27 ENCOUNTER — Other Ambulatory Visit: Payer: Self-pay

## 2020-06-27 ENCOUNTER — Encounter: Payer: Self-pay | Admitting: Orthopaedic Surgery

## 2020-06-27 ENCOUNTER — Ambulatory Visit (INDEPENDENT_AMBULATORY_CARE_PROVIDER_SITE_OTHER): Payer: No Typology Code available for payment source | Admitting: Orthopaedic Surgery

## 2020-06-27 VITALS — Ht 66.0 in | Wt 173.0 lb

## 2020-06-27 DIAGNOSIS — S63055A Dislocation of other carpometacarpal joint of left hand, initial encounter: Secondary | ICD-10-CM | POA: Diagnosis not present

## 2020-06-27 DIAGNOSIS — M189 Osteoarthritis of first carpometacarpal joint, unspecified: Secondary | ICD-10-CM | POA: Diagnosis not present

## 2020-06-27 NOTE — Progress Notes (Signed)
Office Visit Note   Patient: Larry [name]           Date of Birth: 1951-06-29           MRN: [id number] Visit Date: 06/27/2020              Requested by: Merrilee Seashore, [place],  [place] [address] PCP: Merrilee Seashore, MD   Assessment & Plan: Visit Diagnoses:  1. CMC (carpometacarpal joint) dislocation, left, initial encounter   2. 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 Erlinda Hong for evaluation for surgical intervention.  Follow-Up Instructions: No follow-ups on file.   Orders:  Orders Placed This Encounter  Procedures  . Ambulatory referral to Orthopedic Surgery   No orders of the defined types were placed in this encounter.     Procedures: No procedures performed   Clinical Data: No additional findings.   Subjective: Chief Complaint  Patient presents with  . Left Hand - Pain   HPI Patient presents today for his left hand. He was last here in August and received a cortisone injection into his 1st [facility] joint. Patient states that the injection might have helped for a day or two, but no relief otherwise. He applies voltaren daily daily and wears a CMC brace. He is unable to grip anything with his hand. He states that he is ready to have it fixed. He is right hand dominant.    Review of Systems  Constitutional: Negative for fatigue.  HENT: Negative for ear pain.   Eyes: Negative for pain.  Respiratory: Negative for shortness of breath.   Cardiovascular: Negative for leg swelling.  Gastrointestinal: Negative for constipation and diarrhea.  Endocrine: Negative for cold intolerance and heat intolerance.  Genitourinary: Negative for difficulty urinating.  Musculoskeletal: Negative for joint swelling.  Skin: Negative for rash.  Allergic/Immunologic: Negative for food allergies.  Neurological: Positive for weakness.  Hematological: Does not bruise/bleed easily.    Psychiatric/Behavioral: Negative for sleep disturbance.     Objective: Vital Signs: Ht 5\' 6"  (1.676 m)   Wt 173 lb (78.5 kg)   BMI 27.92 kg/m   Physical Exam Constitutional:      Appearance: Normal appearance. He is well-developed. He is obese.  HENT:     Head: Normocephalic.  Eyes:     Pupils: Pupils are equal, round, and reactive to light.  Pulmonary:     Effort: Pulmonary effort is normal.  Skin:    General: Skin is warm and dry.  Neurological:     Mental Status: He is alert and oriented to person, place, and time.  Psychiatric:        Behavior: Behavior normal.     Ortho Exam  Tender 1st CMC joint Pain with grind test   Specialty Comments:  No specialty comments available.  Imaging: No results found.   PMFS History: Patient Active Problem List   Diagnosis Date Noted  . 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 Osteoarthritis of left AC (acromioclavicular) joint 05/07/2020  . Impingement syndrome of left shoulder 02/08/2020  . S/P TKR (total knee replacement) 10/10/2019  . S/P TKR (total knee replacement) using cement, left 10/03/2019  . Cellulitis of arm 08/28/2019  . Essential hypertension 02/16/2018  . Sepsis due to skin infection ([place]) 02/16/2018  . Chronic pain 02/16/2018  . Cellulitis of left lower extremity   . Lumbar radiculopathy,  chronic 01/24/2018  . Lumbar stenosis with neurogenic claudication 03/23/2017  . Hypothyroidism 07/25/2015  . Primary osteoarthritis of right knee 07/23/2015  . Primary osteoarthritis of knee 07/23/2015   Past Medical History:  Diagnosis Date  . Arthritis    "knees; left shoulder" (04/12/2013)  . Asthma    "as a child" (04/12/2013)  . DDD (degenerative disc disease), cervical   . DDD (degenerative disc disease), lumbar   . Difficult intubation    2012   CERVICAL FUSION   . Headache(784.0)    VERAPAMIL FOR PREVENTION   . 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  states that he is a carrier  . 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 History of chicken pox    as an adult  . Hypoglycemia    last episode 1 yr. ago, just gets jittery  . Hypothyroidism   . Lumbar stenosis   . MRSA (methicillin resistant staph aureus) culture positive 09/10/2019   upper arm  . OSA (obstructive sleep apnea)    "haven't wore mask in years; had OR; still snore" (04/12/2013)  . Urinary frequency     History reviewed. No pertinent family history.  Past Surgical History:  Procedure Laterality Date  . BACK SURGERY    . CERVICAL FUSION  2002; 2012  . CYSTECTOMY    . INGUINAL HERNIA REPAIR Bilateral 1997  . KNEE ARTHROSCOPY Left ~ 1997; ~ 2005  . LUMBAR FUSION  2007; 04/11/2013  . PENILE DEBRIDEMENT  ~ 2011   X 3  FOR MRSA INFECTION    . SHOULDER ARTHROSCOPY Left ~ 2003  . TONSILLECTOMY  1990's  . TOTAL KNEE ARTHROPLASTY Right 07/23/2015  . TOTAL KNEE ARTHROPLASTY Right 07/23/2015   Procedure: TOTAL KNEE ARTHROPLASTY;  Surgeon: Garald Balding, MD;  Location: [place];  Service: Orthopedics;  Laterality: Right;  . TOTAL KNEE ARTHROPLASTY Left 10/03/2019   Procedure: LEFT TOTAL KNEE ARTHROPLASTY;  Surgeon: Garald Balding, MD;  [facility];  Service: Orthopedics;  Laterality: Left;  . UVULOPALATOPHARYNGOPLASTY (UPPP)/TONSILLECTOMY/SEPTOPLASTY  1990's  . VASECTOMY     Social History   Occupational History  . Not on file  Tobacco Use  . Smoking status: Former Smoker    Packs/day: 0.50    Years: 15.00    Pack years: 7.50    Types: Cigarettes    Quit date: 10/14/1986    Years since quitting: 33.7  . Smokeless tobacco: Never Used  Vaping Use  . Vaping Use: Never used  Substance and Sexual Activity  . Alcohol use: Yes    Comment: occasional  . Drug use: No  . Sexual activity: Yes

## 2020-07-02 ENCOUNTER — Encounter: Payer: Self-pay | Admitting: Orthopaedic Surgery

## 2020-07-02 ENCOUNTER — Ambulatory Visit (INDEPENDENT_AMBULATORY_CARE_PROVIDER_SITE_OTHER): Payer: Medicare Other | Admitting: Orthopaedic Surgery

## 2020-07-02 DIAGNOSIS — M189 Osteoarthritis of first carpometacarpal joint, unspecified: Secondary | ICD-10-CM

## 2020-07-02 NOTE — Progress Notes (Signed)
Office Visit Note   Patient: Larry [name]           Date of Birth: May 14, 1951           MRN: [id number] Visit Date: 07/02/2020              Requested by: Cherylann Ratel, PA-C [address] [place],  [place] [address] PCP: Merrilee Seashore, MD   Assessment & Plan: Visit Diagnoses:  1. 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 I reviewed the x-rays with the patient in detail and based on his lack of improvement from conservative treatment I have recommended CMC arthroplasty. Details of the surgery including potential risk benefits and alternatives were discussed in detail. Patient has elected to move forward with the surgery. Questions encouraged and answered. Rehab and recovery reviewed as well.  Follow-Up Instructions: Return if symptoms worsen or fail to improve.   Orders:  No orders of the defined types were placed in this encounter.  No orders of the defined types were placed in this encounter.     Procedures: No procedures performed   Clinical Data: No additional findings.   Subjective: Chief Complaint  Patient presents with  . Left Hand - Pain    [name] is a very pleasant 69 year old gentleman referral from Dr.Whitfield who comes in for surgical consultation of left thumb CMC arthritis. He has undergone extensive conservative treatment by Dr. Durward Fortes in the forms of bracing and injections and medications. They have failed to provide him with any significant relief and he is looking for a more permanent solution. He is currently working at [employer] doing light duty. He is right-hand dominant.   Review of Systems  Constitutional: Negative.   All other systems reviewed and are negative.    Objective: Vital Signs: There were no vitals taken for this visit.  Physical Exam Vitals and nursing note reviewed.  Constitutional:      Appearance: He is well-developed.    Pulmonary:     Effort: Pulmonary effort is normal.  Abdominal:     Palpations: Abdomen is soft.  Skin:    General: Skin is warm.  Neurological:     Mental Status: He is alert and oriented to person, place, and time.  Psychiatric:        Behavior: Behavior normal.        Thought Content: Thought content normal.        Judgment: Judgment normal.     Ortho Exam Left hand shows a positive grind test. No triggering. Negative carpal tunnel compressive signs. Negative Finkelstein's. Specialty Comments:  No specialty comments available.  Imaging: No results found.   PMFS History: Patient Active Problem List   Diagnosis Date Noted  . 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 Osteoarthritis of left AC (acromioclavicular) joint 05/07/2020  . Impingement syndrome of left shoulder 02/08/2020  . S/P TKR (total knee replacement) 10/10/2019  . S/P TKR (total knee replacement) using cement, left 10/03/2019  . Cellulitis of arm 08/28/2019  . Essential hypertension 02/16/2018  . Sepsis due to skin infection ([place]) 02/16/2018  . Chronic pain 02/16/2018  . Cellulitis of left lower extremity   . Lumbar radiculopathy, chronic 01/24/2018  . Lumbar stenosis with neurogenic claudication 03/23/2017  . Hypothyroidism 07/25/2015  . Primary osteoarthritis of right knee 07/23/2015  . Primary osteoarthritis of knee 07/23/2015   Past Medical History:  Diagnosis  Date  . Arthritis    "knees; left shoulder" (04/12/2013)  . Asthma    "as a child" (04/12/2013)  . DDD (degenerative disc disease), cervical   . DDD (degenerative disc disease), lumbar   . Difficult intubation    2012   CERVICAL FUSION   . Headache(784.0)    VERAPAMIL FOR PREVENTION   . 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 History of chicken pox    as an adult  . Hypoglycemia    last episode 1 yr. ago, just  gets jittery  . Hypothyroidism   . Lumbar stenosis   . MRSA (methicillin resistant staph aureus) culture positive 09/10/2019   upper arm  . OSA (obstructive sleep apnea)    "haven't wore mask in years; had OR; still snore" (04/12/2013)  . Urinary frequency     History reviewed. No pertinent family history.  Past Surgical History:  Procedure Laterality Date  . BACK SURGERY    . CERVICAL FUSION  2002; 2012  . CYSTECTOMY    . INGUINAL HERNIA REPAIR Bilateral 1997  . KNEE ARTHROSCOPY Left ~ 1997; ~ 2005  . LUMBAR FUSION  2007; 04/11/2013  . PENILE DEBRIDEMENT  ~ 2011   X 3  FOR MRSA INFECTION    . SHOULDER ARTHROSCOPY Left ~ 2003  . TONSILLECTOMY  1990's  . TOTAL KNEE ARTHROPLASTY Right 07/23/2015  . TOTAL KNEE ARTHROPLASTY Right 07/23/2015   Procedure: TOTAL KNEE ARTHROPLASTY;  Surgeon: Garald Balding, MD;  Location: [facility];  Service: Orthopedics;  Laterality: Right;  . TOTAL KNEE ARTHROPLASTY Left 10/03/2019   Procedure: LEFT TOTAL KNEE ARTHROPLASTY;  Surgeon: Garald Balding, MD;  Location: [facility];  Service: Orthopedics;  Laterality: Left;  . UVULOPALATOPHARYNGOPLASTY (UPPP)/TONSILLECTOMY/SEPTOPLASTY  1990's  . VASECTOMY     Social History   Occupational History  . Not on file  Tobacco Use  . Smoking status: Former Smoker    Packs/day: 0.50    Years: 15.00    Pack years: 7.50    Types: Cigarettes    Quit date: 10/14/1986    Years since quitting: 33.7  . Smokeless tobacco: Never Used  Vaping Use  . Vaping Use: Never used  Substance and Sexual Activity  . Alcohol use: Yes    Comment: occasional  . Drug use: No  . Sexual activity: Yes

## 2020-07-08 ENCOUNTER — Other Ambulatory Visit: Payer: Self-pay

## 2020-07-09 ENCOUNTER — Other Ambulatory Visit: Payer: Self-pay

## 2020-07-09 ENCOUNTER — Encounter: Payer: Self-pay | Admitting: Orthopaedic Surgery

## 2020-07-09 ENCOUNTER — Ambulatory Visit (INDEPENDENT_AMBULATORY_CARE_PROVIDER_SITE_OTHER): Payer: No Typology Code available for payment source | Admitting: Orthopaedic Surgery

## 2020-07-09 ENCOUNTER — Ambulatory Visit: Payer: Self-pay

## 2020-07-09 VITALS — Ht 66.0 in | Wt 173.0 lb

## 2020-07-09 DIAGNOSIS — M25512 Pain in left shoulder: Secondary | ICD-10-CM

## 2020-07-09 DIAGNOSIS — S46012A Strain of muscle(s) and tendon(s) of the rotator cuff of left shoulder, initial encounter: Secondary | ICD-10-CM | POA: Diagnosis not present

## 2020-07-09 DIAGNOSIS — G8929 Other chronic pain: Secondary | ICD-10-CM | POA: Diagnosis not present

## 2020-07-09 DIAGNOSIS — M75122 Complete rotator cuff tear or rupture of left shoulder, not specified as traumatic: Secondary | ICD-10-CM | POA: Insufficient documentation

## 2020-07-09 NOTE — Progress Notes (Signed)
Office Visit Note   Patient: Larry [name]           Date of Birth: 05-16-1951           MRN: [id number] Visit Date: 07/09/2020              Requested by: Merrilee Seashore, [place] [address] [place],  [place] [address] PCP: Merrilee Seashore, MD   Assessment & Plan: Visit Diagnoses:  1. Chronic left shoulder pain   2. Traumatic complete tear of left rotator cuff, initial encounter     Plan: Larry [name] sustained an on-the-job injury to his left shoulder on 03 May 2020.  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  Later that day he was loading 2 by fours into her truck and the wood dropped suddenly with a recurrent injury to the same shoulder.  He notified his "boss" of his injury and was eventually seen at [facility] with the following week.  He was placed initially on NSAIDs.  He subsequently received "2 shots".  He notes that the injections did not make much of a difference and, accordingly, had an MRI scan.  This was performed through the [facility] on 05/30/2020.  I have a copy of the report demonstrating full-thickness partial width tear at the junction of the infra and supraspinatus tendons.  The gap is about 1.5  x 1.2 cm.  Subscapularis and teres minor tendons intact.  There was moderate tendinosis versus partial tearing of the biceps long head.  There was a tear at the base of the superior labrum extending from anterior to posterior at the biceps anchor.  Mild shallow osteochondral changes of the inferior glenoid otherwise intact.  Mild muscle atrophy of the infra and supraspinatus tendons.  Long discussion with Larry [name] regarding all of the above.  I would suggest an arthroscopic SCD and DCR and then a mini open rotator cuff tear repair and probable biceps tenodesis.  Dr. Erlinda Hong is planning to perform an arthroplasty at the base of his left thumb in approximately 10 days.  Left coordinate the left shoulder  surgery with his rehab sometime in November.  I discussed the surgery, time in a sling and need for physical therapy and probably several months if not longer before he would have much use of his left arm to do any lifting.  He certainly would be able to work several weeks after surgery with his arm in a sling if allowable.  He would like to proceed  Follow-Up Instructions: Return We will schedule rotator cuff tear surgery left shoulder.   Orders:  Orders Placed This Encounter  Procedures  . XR Shoulder Left   No orders of the defined types were placed in this encounter.     Procedures: No procedures performed   Clinical Data: No additional findings.   Subjective: Chief Complaint  Patient presents with  . Left Shoulder - Pain  Patient presents today for left shoulder pain. He states that he injured his shoulder while at work on 05/03/2020. 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 Then later that day he was loading 2X4s for a customer with another employee and the wood dropped. He instinctively reached to catch the wood and it pulled his shoulder. He has had pain anteriorly and superiorly since. He experiences a pulling sensation if he reaches backwards. No numbness or  tingling. He is right hand dominant. He is taking Vicodin for pain as needed.  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 Having difficulty with pain raising his arm over his head.  He is had some discomfort in the anterior lateral subacromial region with abduction and flexion of his shoulder.  No numbness or tingling.  HPI  Review of Systems   Objective: Vital Signs: Ht 5\' 6"  (1.676 m)   Wt 173 lb (78.5 kg)   BMI 27.92 kg/m   Physical Exam Constitutional:      Appearance: He is well-developed.  Eyes:     Pupils: Pupils are equal, round, and reactive to light.  Pulmonary:     Effort: Pulmonary effort is normal.  Skin:    General: Skin is warm and dry.   Neurological:     Mental Status: He is alert and oriented to person, place, and time.  Psychiatric:        Behavior: Behavior normal.     Ortho Exam awake alert and oriented x3.  Comfortable sitting.  Mild impingement testing left shoulder.  Able to place his arm fully over his head with good strength.  Did have some pain with abduction and of the left arm in the anterior lateral subacromial region.  Some prominence of the [facility] joint but minimal discomfort.  Some clicking with rotation of his arm internal and external rotation in the anterior subacromial region.  Positive Speed sign.  Good grip and release although does have discomfort at the base of the thumb related to the arthritis previously identified  Specialty Comments:  No specialty comments available.  Imaging: XR Shoulder Left  Result Date: 07/09/2020 Films left shoulder pain in 3 projections.  The humeral head is centered about the glenoid.  Normal space between the humeral head and the acromion.  No ectopic calcification or acute changes.  Moderate degenerative changes of the acromioclavicular joint    PMFS History: Patient Active Problem List   Diagnosis Date Noted  . Complete tear of left rotator cuff 07/09/2020  . 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 Osteoarthritis of left AC (acromioclavicular) joint 05/07/2020  . Impingement syndrome of left shoulder 02/08/2020  . S/P TKR (total knee replacement) 10/10/2019  . S/P TKR (total knee replacement) using cement, left 10/03/2019  . Cellulitis of arm 08/28/2019  . Essential hypertension 02/16/2018  . Sepsis due to skin infection ([place]) 02/16/2018  . Chronic pain 02/16/2018  . Cellulitis of left lower extremity   . Lumbar radiculopathy, chronic 01/24/2018  . Lumbar stenosis with neurogenic claudication 03/23/2017  . Hypothyroidism 07/25/2015  . Primary osteoarthritis of right knee 07/23/2015  . Primary osteoarthritis of knee  07/23/2015   Past Medical History:  Diagnosis Date  . Arthritis    "knees; left shoulder" (04/12/2013)  . Asthma    "as a child" (04/12/2013)  . DDD (degenerative disc disease), cervical   . DDD (degenerative disc disease), lumbar   . Difficult intubation    2012   CERVICAL FUSION   . Headache(784.0)    VERAPAMIL FOR PREVENTION   . 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 History of chicken pox    as an adult  . Hypoglycemia    last episode 1 yr. ago, just gets jittery  . Hypothyroidism   . Lumbar stenosis   . MRSA (methicillin resistant staph aureus) culture positive 09/10/2019  upper arm  . OSA (obstructive sleep apnea)    "haven't wore mask in years; had OR; still snore" (04/12/2013)  . Urinary frequency     No family history on file.  Past Surgical History:  Procedure Laterality Date  . BACK SURGERY    . CERVICAL FUSION  2002; 2012  . CYSTECTOMY    . INGUINAL HERNIA REPAIR Bilateral 1997  . KNEE ARTHROSCOPY Left ~ 1997; ~ 2005  . LUMBAR FUSION  2007; 04/11/2013  . PENILE DEBRIDEMENT  ~ 2011   X 3  FOR MRSA INFECTION    . SHOULDER ARTHROSCOPY Left ~ 2003  . TONSILLECTOMY  1990's  . TOTAL KNEE ARTHROPLASTY Right 07/23/2015  . TOTAL KNEE ARTHROPLASTY Right 07/23/2015   Procedure: TOTAL KNEE ARTHROPLASTY;  Surgeon: Garald Balding, MD;  Location: [facility];  Service: Orthopedics;  Laterality: Right;  . TOTAL KNEE ARTHROPLASTY Left 10/03/2019   Procedure: LEFT TOTAL KNEE ARTHROPLASTY;  Surgeon: Garald Balding, MD;  [facility];  Service: Orthopedics;  Laterality: Left;  . UVULOPALATOPHARYNGOPLASTY (UPPP)/TONSILLECTOMY/SEPTOPLASTY  1990's  . VASECTOMY     Social History   Occupational History  . Not on file  Tobacco Use  . Smoking status: Former Smoker    Packs/day: 0.50    Years: 15.00    Pack years: 7.50    Types: Cigarettes    Quit date: 10/14/1986    Years since  quitting: 33.7  . Smokeless tobacco: Never Used  Vaping Use  . Vaping Use: Never used  Substance and Sexual Activity  . Alcohol use: Yes    Comment: occasional  . Drug use: No  . Sexual activity: Yes

## 2020-07-11 ENCOUNTER — Encounter (HOSPITAL_BASED_OUTPATIENT_CLINIC_OR_DEPARTMENT_OTHER): Payer: Self-pay | Admitting: Orthopaedic Surgery

## 2020-07-11 ENCOUNTER — Other Ambulatory Visit: Payer: Self-pay

## 2020-07-16 ENCOUNTER — Other Ambulatory Visit (HOSPITAL_COMMUNITY): Payer: Medicare Other

## 2020-07-17 ENCOUNTER — Other Ambulatory Visit (HOSPITAL_COMMUNITY)
Admission: RE | Admit: 2020-07-17 | Discharge: 2020-07-17 | Disposition: A | Discharge status: Home / Self Care | Payer: Medicare Other | Source: Ambulatory Visit | Attending: Orthopaedic Surgery | Admitting: Orthopaedic Surgery

## 2020-07-17 ENCOUNTER — Encounter (HOSPITAL_BASED_OUTPATIENT_CLINIC_OR_DEPARTMENT_OTHER)
Admission: RE | Admit: 2020-07-17 | Discharge: 2020-07-17 | Disposition: A | Discharge status: Home / Self Care | Payer: Medicare Other | Source: Ambulatory Visit | Attending: Orthopaedic Surgery | Admitting: Orthopaedic Surgery

## 2020-07-17 DIAGNOSIS — Z20822 Contact with and (suspected) exposure to covid-19: Secondary | ICD-10-CM | POA: Diagnosis not present

## 2020-07-17 DIAGNOSIS — Z01818 Encounter for other preprocedural examination: Secondary | ICD-10-CM | POA: Insufficient documentation

## 2020-07-17 LAB — BASIC METABOLIC PANEL
Anion gap: 6 (ref 5–15)
BUN: 11 mg/dL (ref 8–23)
CO2: 31 mmol/L (ref 22–32)
Calcium: 9.4 mg/dL (ref 8.9–10.3)
Chloride: 103 mmol/L (ref 98–111)
Creatinine, Ser: 0.82 mg/dL (ref 0.61–1.24)
GFR, Estimated: >60 mL/min (ref >60)
Glucose, Bld: 103 mg/dL — ABNORMAL HIGH (ref 70–99)
Potassium: 5.3 mmol/L — ABNORMAL HIGH (ref 3.5–5.1)
Sodium: 140 mmol/L (ref 135–145)

## 2020-07-17 LAB — SARS CORONAVIRUS 2 (TAT 6-24 HRS): SARS Coronavirus 2: NEGATIVE

## 2020-07-17 NOTE — Progress Notes (Signed)

## 2020-07-17 NOTE — Progress Notes (Signed)
Patients potassium resulted at 5.3 from pre op lab work. Reviewed with Dr. Gifford Shave, anesthesiologist at [facility]. Pt will need DOS BMP per Dr. Gifford Shave. Pt called and instructed to come DOS at 0930 instead of 1000.

## 2020-07-19 ENCOUNTER — Ambulatory Visit (HOSPITAL_BASED_OUTPATIENT_CLINIC_OR_DEPARTMENT_OTHER)
Admission: RE | Admit: 2020-07-19 | Discharge: 2020-07-19 | Disposition: A | Discharge status: Home / Self Care | Payer: Medicare Other | Attending: Orthopaedic Surgery | Admitting: Orthopaedic Surgery

## 2020-07-19 ENCOUNTER — Ambulatory Visit (HOSPITAL_BASED_OUTPATIENT_CLINIC_OR_DEPARTMENT_OTHER): Payer: Medicare Other | Admitting: Anesthesiology

## 2020-07-19 ENCOUNTER — Encounter (HOSPITAL_BASED_OUTPATIENT_CLINIC_OR_DEPARTMENT_OTHER)
Admission: RE | Disposition: A | Discharge status: Home / Self Care | Payer: Self-pay | Source: Home / Self Care | Attending: Orthopaedic Surgery

## 2020-07-19 ENCOUNTER — Encounter (HOSPITAL_BASED_OUTPATIENT_CLINIC_OR_DEPARTMENT_OTHER): Payer: Self-pay | Admitting: Orthopaedic Surgery

## 2020-07-19 ENCOUNTER — Other Ambulatory Visit: Payer: Self-pay

## 2020-07-19 DIAGNOSIS — Z79890 Hormone replacement therapy (postmenopausal): Secondary | ICD-10-CM | POA: Insufficient documentation

## 2020-07-19 DIAGNOSIS — J45909 Unspecified asthma, uncomplicated: Secondary | ICD-10-CM | POA: Insufficient documentation

## 2020-07-19 DIAGNOSIS — Z96653 Presence of artificial knee joint, bilateral: Secondary | ICD-10-CM | POA: Diagnosis not present

## 2020-07-19 DIAGNOSIS — Z7982 Long term (current) use of aspirin: Secondary | ICD-10-CM | POA: Insufficient documentation

## 2020-07-19 DIAGNOSIS — G4733 Obstructive sleep apnea (adult) (pediatric): Secondary | ICD-10-CM | POA: Insufficient documentation

## 2020-07-19 DIAGNOSIS — Z79899 Other long term (current) drug therapy: Secondary | ICD-10-CM | POA: Diagnosis not present

## 2020-07-19 DIAGNOSIS — Z87891 Personal history of nicotine dependence: Secondary | ICD-10-CM | POA: Diagnosis not present

## 2020-07-19 DIAGNOSIS — Z8614 Personal history of Methicillin resistant Staphylococcus aureus infection: Secondary | ICD-10-CM | POA: Insufficient documentation

## 2020-07-19 DIAGNOSIS — I10 Essential (primary) hypertension: Secondary | ICD-10-CM | POA: Insufficient documentation

## 2020-07-19 DIAGNOSIS — E039 Hypothyroidism, unspecified: Secondary | ICD-10-CM | POA: Insufficient documentation

## 2020-07-19 DIAGNOSIS — M189 Osteoarthritis of first carpometacarpal joint, unspecified: Secondary | ICD-10-CM

## 2020-07-19 DIAGNOSIS — M1812 Unilateral primary osteoarthritis of first carpometacarpal joint, left hand: Secondary | ICD-10-CM | POA: Diagnosis not present

## 2020-07-19 HISTORY — PX: CARPOMETACARPEL SUSPENSION PLASTY: SHX5005

## 2020-07-19 LAB — BASIC METABOLIC PANEL
Anion gap: 8 (ref 5–15)
BUN: 21 mg/dL (ref 8–23)
CO2: 31 mmol/L (ref 22–32)
Calcium: 9.3 mg/dL (ref 8.9–10.3)
Chloride: 100 mmol/L (ref 98–111)
Creatinine, Ser: 1.11 mg/dL (ref 0.61–1.24)
GFR, Estimated: >60 mL/min (ref >60)
Glucose, Bld: 99 mg/dL (ref 70–99)
Potassium: 3.6 mmol/L (ref 3.5–5.1)
Sodium: 139 mmol/L (ref 135–145)

## 2020-07-19 SURGERY — CARPOMETACARPEL (CMC) SUSPENSION PLASTY
Anesthesia: Monitor Anesthesia Care | Site: Hand | Laterality: Left

## 2020-07-19 MED ORDER — FENTANYL CITRATE (PF) 100 MCG/2ML IJ SOLN
INTRAMUSCULAR | Status: DC | PRN
  Administered 2020-07-19 (×2): 50 ug via INTRAVENOUS

## 2020-07-19 MED ORDER — MIDAZOLAM HCL 2 MG/2ML IJ SOLN
INTRAMUSCULAR | Status: AC
  Filled 2020-07-19: qty 2

## 2020-07-19 MED ORDER — HYDROMORPHONE HCL 1 MG/ML IJ SOLN: 0.2500 mg | INTRAMUSCULAR | Status: DC | PRN

## 2020-07-19 MED ORDER — PROPOFOL 500 MG/50ML IV EMUL
INTRAVENOUS | Status: AC
  Filled 2020-07-19: qty 50

## 2020-07-19 MED ORDER — LIDOCAINE HCL (CARDIAC) PF 100 MG/5ML IV SOSY
PREFILLED_SYRINGE | INTRAVENOUS | Status: DC | PRN
  Administered 2020-07-19: 60 mg via INTRAVENOUS

## 2020-07-19 MED ORDER — LACTATED RINGERS IV SOLN: INTRAVENOUS | Status: DC

## 2020-07-19 MED ORDER — DEXAMETHASONE SODIUM PHOSPHATE 4 MG/ML IJ SOLN
INTRAMUSCULAR | Status: DC | PRN
  Administered 2020-07-19: 5 mg via INTRAVENOUS

## 2020-07-19 MED ORDER — DEXAMETHASONE SODIUM PHOSPHATE 10 MG/ML IJ SOLN
INTRAMUSCULAR | Status: DC | PRN
  Administered 2020-07-19: 5 mg

## 2020-07-19 MED ORDER — MIDAZOLAM HCL 2 MG/2ML IJ SOLN
1.0000 mg | Freq: Once | INTRAMUSCULAR | Status: AC
  Administered 2020-07-19: 1 mg via INTRAVENOUS

## 2020-07-19 MED ORDER — MIDAZOLAM HCL 5 MG/5ML IJ SOLN
INTRAMUSCULAR | Status: DC | PRN
  Administered 2020-07-19: 2 mg via INTRAVENOUS

## 2020-07-19 MED ORDER — CEFAZOLIN SODIUM-DEXTROSE 2-4 GM/100ML-% IV SOLN
INTRAVENOUS | Status: AC
  Filled 2020-07-19: qty 100

## 2020-07-19 MED ORDER — CEFAZOLIN SODIUM-DEXTROSE 2-4 GM/100ML-% IV SOLN
2.0000 g | INTRAVENOUS | Status: AC
  Administered 2020-07-19: 2 g via INTRAVENOUS

## 2020-07-19 MED ORDER — KETOROLAC TROMETHAMINE 10 MG PO TABS: 10.0000 mg | ORAL_TABLET | Freq: Two times a day (BID) | ORAL | 0 refills | Status: AC | PRN

## 2020-07-19 MED ORDER — ONDANSETRON HCL 4 MG/2ML IJ SOLN: 4.0000 mg | Freq: Once | INTRAMUSCULAR | Status: DC | PRN

## 2020-07-19 MED ORDER — ROPIVACAINE HCL 7.5 MG/ML IJ SOLN
INTRAMUSCULAR | Status: DC | PRN
  Administered 2020-07-19: 20 mL via PERINEURAL

## 2020-07-19 MED ORDER — ONDANSETRON HCL 4 MG/2ML IJ SOLN
INTRAMUSCULAR | Status: DC | PRN
  Administered 2020-07-19: 4 mg via INTRAVENOUS

## 2020-07-19 MED ORDER — ACETAMINOPHEN 10 MG/ML IV SOLN: 1000.0000 mg | Freq: Once | INTRAVENOUS | Status: DC | PRN

## 2020-07-19 MED ORDER — FENTANYL CITRATE (PF) 100 MCG/2ML IJ SOLN
INTRAMUSCULAR | Status: AC
  Filled 2020-07-19: qty 2

## 2020-07-19 MED ORDER — HYDROMORPHONE HCL 2 MG PO TABS
2.0000 mg | ORAL_TABLET | ORAL | 0 refills | Status: DC | PRN
Start: 2020-07-19 — End: 2020-08-21

## 2020-07-19 MED ORDER — PROPOFOL 500 MG/50ML IV EMUL
INTRAVENOUS | Status: DC | PRN
  Administered 2020-07-19: 100 ug/kg/min via INTRAVENOUS

## 2020-07-19 MED ORDER — FENTANYL CITRATE (PF) 100 MCG/2ML IJ SOLN
50.0000 ug | Freq: Once | INTRAMUSCULAR | Status: AC
  Administered 2020-07-19: 50 ug via INTRAVENOUS

## 2020-07-19 MED ORDER — LIDOCAINE 2% (20 MG/ML) 5 ML SYRINGE
INTRAMUSCULAR | Status: AC
  Filled 2020-07-19: qty 5

## 2020-07-19 MED ORDER — ONDANSETRON HCL 4 MG/2ML IJ SOLN
INTRAMUSCULAR | Status: AC
  Filled 2020-07-19: qty 2

## 2020-07-19 SURGICAL SUPPLY — 79 items
BAND INSRT 18 STRL LF DISP RB (MISCELLANEOUS) ×1
BAND RUBBER #18 3X1/16 STRL (MISCELLANEOUS) ×2 IMPLANT
BIT DRILL PASSING CMC 1/4 FLEX (BIT) IMPLANT
BLADE MINI RND TIP GREEN BEAV (BLADE) ×3 IMPLANT
BLADE SURG 15 STRL LF DISP TIS (BLADE) ×2 IMPLANT
BLADE SURG 15 STRL SS (BLADE) ×4
BNDG CMPR 9X4 STRL LF SNTH (GAUZE/BANDAGES/DRESSINGS) ×1
BNDG ELASTIC 3X5.8 VLCR STR LF (GAUZE/BANDAGES/DRESSINGS) ×2 IMPLANT
BNDG ESMARK 4X9 LF (GAUZE/BANDAGES/DRESSINGS) ×2 IMPLANT
BRUSH SCRUB EZ PLAIN DRY (MISCELLANEOUS) ×2 IMPLANT
BUTTON ALL-SUT W/BACKSTOP (Orthopedic Implant) ×1 IMPLANT
CORD BIPOLAR FORCEPS 12FT (ELECTRODE) ×2 IMPLANT
COVER BACK TABLE 60X90IN (DRAPES) ×2 IMPLANT
COVER WAND RF STERILE (DRAPES) IMPLANT
CUFF TOURN SGL QUICK 18X4 (TOURNIQUET CUFF) ×1 IMPLANT
DECANTER SPIKE VIAL GLASS SM (MISCELLANEOUS) IMPLANT
DRAPE EXTREMITY T 121X128X90 (DISPOSABLE) ×2 IMPLANT
DRAPE IMP U-DRAPE 54X76 (DRAPES) ×2 IMPLANT
DRAPE OEC MINIVIEW 54X84 (DRAPES) ×1 IMPLANT
DRAPE SURG 17X23 STRL (DRAPES) ×2 IMPLANT
DRILL PASSING CMC 1/4 FLEX (BIT) ×2
GAUZE 4X4 16PLY RFD (DISPOSABLE) IMPLANT
GAUZE SPONGE 4X4 12PLY STRL (GAUZE/BANDAGES/DRESSINGS) ×3 IMPLANT
GAUZE XEROFORM 1X8 LF (GAUZE/BANDAGES/DRESSINGS) ×2 IMPLANT
GLOVE BIO SURGEON STRL SZ7 (GLOVE) ×1 IMPLANT
GLOVE BIOGEL PI IND STRL 7.0 (GLOVE) ×1 IMPLANT
GLOVE BIOGEL PI INDICATOR 7.0 (GLOVE) ×1
GLOVE ECLIPSE 7.0 STRL STRAW (GLOVE) ×2 IMPLANT
GLOVE SKINSENSE NS SZ7.5 (GLOVE) ×1
GLOVE SKINSENSE STRL SZ7.5 (GLOVE) ×1 IMPLANT
GLOVE SURG SYN 7.5  E (GLOVE) ×4
GLOVE SURG SYN 7.5 E (GLOVE) ×2 IMPLANT
GLOVE SURG SYN 7.5 PF PI (GLOVE) ×2 IMPLANT
GOWN SRG XL LVL 4 BRTHBL STRL (GOWNS) ×1 IMPLANT
GOWN STRL NON-REIN XL LVL4 (GOWNS) ×2
GOWN STRL REIN XL XLG (GOWN DISPOSABLE) ×2 IMPLANT
GOWN STRL REUS W/ TWL LRG LVL3 (GOWN DISPOSABLE) ×1 IMPLANT
GOWN STRL REUS W/ TWL XL LVL3 (GOWN DISPOSABLE) ×1 IMPLANT
GOWN STRL REUS W/TWL LRG LVL3 (GOWN DISPOSABLE) ×2
GOWN STRL REUS W/TWL XL LVL3 (GOWN DISPOSABLE) ×2
NDL HYPO 25X1 1.5 SAFETY (NEEDLE) IMPLANT
NEEDLE HYPO 25X1 1.5 SAFETY (NEEDLE) IMPLANT
NS IRRIG 1000ML POUR BTL (IV SOLUTION) ×2 IMPLANT
PACK BASIN DAY SURGERY FS (CUSTOM PROCEDURE TRAY) ×2 IMPLANT
PAD CAST 3X4 CTTN HI CHSV (CAST SUPPLIES) ×1 IMPLANT
PADDING CAST ABS 3INX4YD NS (CAST SUPPLIES)
PADDING CAST ABS 4INX4YD NS (CAST SUPPLIES) ×1
PADDING CAST ABS COTTON 3X4 (CAST SUPPLIES) IMPLANT
PADDING CAST ABS COTTON 4X4 ST (CAST SUPPLIES) ×1 IMPLANT
PADDING CAST COTTON 3X4 STRL (CAST SUPPLIES) ×2
PIN TRAPEZIUM CMC MICROLINK (BIT) ×1 IMPLANT
SHEET MEDIUM DRAPE 40X70 STRL (DRAPES) ×3 IMPLANT
SLEEVE SCD COMPRESS KNEE MED (MISCELLANEOUS) ×2 IMPLANT
SPLINT FIBERGLASS 3X35 (CAST SUPPLIES) IMPLANT
STOCKINETTE 4X48 STRL (DRAPES) ×2 IMPLANT
SUCTION FRAZIER HANDLE 10FR (MISCELLANEOUS)
SUCTION TUBE FRAZIER 10FR DISP (MISCELLANEOUS) IMPLANT
SUT ETHIBOND 0 MO6 C/R (SUTURE) IMPLANT
SUT ETHILON 4 0 PS 2 18 (SUTURE) ×2 IMPLANT
SUT FIBERWIRE #2 38 T-5 BLUE (SUTURE)
SUT FIBERWIRE 2-0 18 17.9 3/8 (SUTURE)
SUT MNCRL AB 4-0 PS2 18 (SUTURE) IMPLANT
SUT VIC AB 0 SH 27 (SUTURE) ×1 IMPLANT
SUT VIC AB 2-0 CT1 27 (SUTURE)
SUT VIC AB 2-0 CT1 TAPERPNT 27 (SUTURE) IMPLANT
SUT VIC AB 2-0 SH 27 (SUTURE) ×2
SUT VIC AB 2-0 SH 27XBRD (SUTURE) ×1 IMPLANT
SUT VIC AB 3-0 CT1 27 (SUTURE) ×2
SUT VIC AB 3-0 CT1 TAPERPNT 27 (SUTURE) IMPLANT
SUTURE FIBERWR #2 38 T-5 BLUE (SUTURE) IMPLANT
SUTURE FIBERWR 2-0 18 17.9 3/8 (SUTURE) IMPLANT
SUTURE TAPE 1.3 FIBERLOP 20 ST (SUTURE) IMPLANT
SUTURETAPE 1.3 FIBERLOOP 20 ST (SUTURE)
SYR BULB EAR ULCER 3OZ GRN STR (SYRINGE) ×2 IMPLANT
SYR CONTROL 10ML LL (SYRINGE) IMPLANT
TOWEL GREEN STERILE FF (TOWEL DISPOSABLE) ×4 IMPLANT
TRAY DSU PREP LF (CUSTOM PROCEDURE TRAY) ×2 IMPLANT
TUBE CONNECTING 20X1/4 (TUBING) ×2 IMPLANT
UNDERPAD 30X36 HEAVY ABSORB (UNDERPADS AND DIAPERS) ×2 IMPLANT

## 2020-07-19 NOTE — Anesthesia Procedure Notes (Signed)
Procedure Name: MAC Date/Time: 07/19/2020 12:19 PM Performed by: Justice Rocher, CRNA Pre-anesthesia Checklist: Patient identified, Emergency Drugs available, Suction available, Patient being monitored and Timeout performed Patient Re-evaluated:Patient Re-evaluated prior to induction Oxygen Delivery Method: Simple face mask Preoxygenation: Pre-oxygenation with 100% oxygen Induction Type: IV induction Placement Confirmation: breath sounds checked- equal and bilateral,  CO2 detector and positive ETCO2

## 2020-07-19 NOTE — Anesthesia Postprocedure Evaluation (Signed)
Anesthesia Post Note  Patient: Larry [name]  Procedure(s) Performed: LEFT THUMB CARPOMETACARPAL ([place]) ARTHROPLASTY (Left Hand)     Patient location during evaluation: PACU Anesthesia Type: Regional and MAC Level of consciousness: awake and alert Pain management: pain level controlled Vital Signs Assessment: post-procedure vital signs reviewed and stable Respiratory status: spontaneous breathing, nonlabored ventilation, respiratory function stable and patient connected to nasal cannula oxygen Cardiovascular status: stable and blood pressure returned to baseline Postop Assessment: no apparent nausea or vomiting Anesthetic complications: no   No complications documented.  Last Vitals:  Vitals:   07/19/20 1415 07/19/20 1430  BP: 102/66 109/65  Pulse:  64  Resp:  16  Temp:  36.5 C  SpO2: 94% 93%    Last Pain:  Vitals:   07/19/20 1430  TempSrc: Oral  PainSc: 0-No pain                 Belenda Cruise P [place]

## 2020-07-19 NOTE — Transfer of Care (Signed)
Immediate Anesthesia Transfer of Care Note  Patient: Larry [name]  Procedure(s) Performed: Procedure(s) (LRB): LEFT THUMB CARPOMETACARPAL ([place]) ARTHROPLASTY (Left)  Patient Location: PACU  Anesthesia Type: MAC  Level of Consciousness: awake, sedated, patient cooperative and responds to stimulation  Airway & Oxygen Therapy: Patient Spontanous Breathing and Patient connected to RA and soft FM   Post-op Assessment: Report given to PACU RN, Post -op Vital signs reviewed and stable and Patient moving all extremities  Post vital signs: Reviewed and stable  Complications: No apparent anesthesia complications

## 2020-07-19 NOTE — Anesthesia Procedure Notes (Signed)
Procedure Name: MAC Date/Time: 07/19/2020 12:32 PM Performed by: Justice Rocher, CRNA Oxygen Delivery Method: Simple face mask Ventilation: Oral airway inserted - appropriate to patient size

## 2020-07-19 NOTE — Progress Notes (Signed)
Assisted Dr. Greg Stoltzfus with left, ultrasound guided, interscalene  block. Side rails up, monitors on throughout procedure. See vital signs in flow sheet. Tolerated Procedure well. 

## 2020-07-19 NOTE — Anesthesia Preprocedure Evaluation (Addendum)
Anesthesia Evaluation  Patient identified by MRN, date of birth, ID band Patient awake  General Assessment Comment:Difficult airway 2/2 to cervical fusions. On exam neck mobility limited. Glidescope used for prior procedures without issue.  Reviewed: Patient's Chart, lab work & pertinent test results  History of Anesthesia Complications (+) DIFFICULT AIRWAY  Airway Mallampati: III  TM Distance: >3 FB Neck ROM: Limited    Dental  (+) Teeth Intact   Pulmonary asthma , sleep apnea , former smoker,    Pulmonary exam normal        Cardiovascular hypertension, Pt. on medications  Rhythm:Regular Rate:Normal     Neuro/Psych  Headaches, negative psych ROS   GI/Hepatic negative GI ROS, Neg liver ROS,   Endo/Other  Hypothyroidism   Renal/GU negative Renal ROS  negative genitourinary   Musculoskeletal  (+) Arthritis , Osteoarthritis,    Abdominal (+)  Abdomen: soft. Bowel sounds: normal.  Peds  Hematology negative hematology ROS (+)   Anesthesia Other Findings   Reproductive/Obstetrics                            Anesthesia Physical Anesthesia Plan  ASA: II  Anesthesia Plan: Regional and MAC   Post-op Pain Management:  Regional for Post-op pain   Induction:   PONV Risk Score and Plan: 1 and Ondansetron, Dexamethasone and Treatment may vary due to age or medical condition  Airway Management Planned: Simple Face Mask and Nasal Cannula  Additional Equipment: None  Intra-op Plan:   Post-operative Plan:   Informed Consent: I have reviewed the patients History and Physical, chart, labs and discussed the procedure including the risks, benefits and alternatives for the proposed anesthesia with the patient or authorized representative who has indicated his/her understanding and acceptance.     Dental advisory given  Plan Discussed with: CRNA  Anesthesia Plan Comments: (    )         Anesthesia Quick Evaluation

## 2020-07-19 NOTE — Anesthesia Procedure Notes (Signed)
Anesthesia Regional Block: Supraclavicular block   Pre-Anesthetic Checklist: ,, timeout performed, Correct Patient, Correct Site, Correct Laterality, Correct Procedure, Correct Position, site marked, Risks and benefits discussed,  Surgical consent,  Pre-op evaluation,  At surgeon's request and post-op pain management  Laterality: Left  Prep: Dura Prep       Needles:  Injection technique: Single-shot  Needle Type: Echogenic Stimulator Needle     Needle Length: 4cm  Needle Gauge: 20     Additional Needles:   Procedures:,,,, ultrasound used (permanent image in chart),,,,  Narrative:  Start time: 07/19/2020 10:55 AM End time: 07/19/2020 11:00 AM Injection made incrementally with aspirations every 5 mL.  Performed by: Personally  Anesthesiologist: Darral Dash, DO  Additional Notes: Patient identified. Risks/Benefits/Options discussed with patient including but not limited to bleeding, infection, nerve damage, failed block, incomplete pain control. Patient expressed understanding and wished to proceed. All questions were answered. Sterile technique was used throughout the entire procedure. Please see nursing notes for vital signs. Aspirated in 5cc intervals with injection for negative confirmation. Patient was given instructions on fall risk and not to get out of bed. All questions and concerns addressed with instructions to call with any issues or inadequate analgesia.

## 2020-07-19 NOTE — Discharge Instructions (Signed)
 Postoperative instructions:  Weightbearing instructions: non weight bearing  Dressing instructions: Keep your dressing and/or splint clean and dry at all times.  It will be removed at your first post-operative appointment.  Your stitches and/or staples will be removed at this visit.  Incision instructions:  Do not soak your incision for 3 weeks after surgery.  If the incision gets wet, pat dry and do not scrub the incision.  Pain control:  You have been given a prescription to be taken as directed for post-operative pain control.  In addition, elevate the operative extremity above the heart at all times to prevent swelling and throbbing pain.  Take over-the-counter Colace, 100mg by mouth twice a day while taking narcotic pain medications to help prevent constipation.  Follow up appointments: 1) 14 days for suture removal and wound check. 2) Dr. Xu as scheduled.   -------------------------------------------------------------------------------------------------------------  After Surgery Pain Control:  After your surgery, post-surgical discomfort or pain is likely. This discomfort can last several days to a few weeks. At certain times of the day your discomfort may be more intense.  Did you receive a nerve block?  A nerve block can provide pain relief for one hour to two days after your surgery. As long as the nerve block is working, you will experience little or no sensation in the area the surgeon operated on.  As the nerve block wears off, you will begin to experience pain or discomfort. It is very important that you begin taking your prescribed pain medication before the nerve block fully wears off. Treating your pain at the first sign of the block wearing off will ensure your pain is better controlled and more tolerable when full-sensation returns. Do not wait until the pain is intolerable, as the medicine will be less effective. It is better to treat pain in advance than to try and  catch up.  General Anesthesia:  If you did not receive a nerve block during your surgery, you will need to start taking your pain medication shortly after your surgery and should continue to do so as prescribed by your surgeon.  Pain Medication:  Most commonly we prescribe Vicodin and Percocet for post-operative pain. Both of these medications contain a combination of acetaminophen (Tylenol) and a narcotic to help control pain.   It takes between 30 and 45 minutes before pain medication starts to work. It is important to take your medication before your pain level gets too intense.   Nausea is a common side effect of many pain medications. You will want to eat something before taking your pain medicine to help prevent nausea.   If you are taking a prescription pain medication that contains acetaminophen, we recommend that you do not take additional over the counter acetaminophen (Tylenol).  Other pain relieving options:   Using a cold pack to ice the affected area a few times a day (15 to 20 minutes at a time) can help to relieve pain, reduce swelling and bruising.   Elevation of the affected area can also help to reduce pain and swelling.   Post Anesthesia Home Care Instructions  Activity: Get plenty of rest for the remainder of the day. A responsible individual must stay with you for 24 hours following the procedure.  For the next 24 hours, DO NOT: -Drive a car -Operate machinery -Drink alcoholic beverages -Take any medication unless instructed by your physician -Make any legal decisions or sign important papers.  Meals: Start with liquid foods such as gelatin or   soup. Progress to regular foods as tolerated. Avoid greasy, spicy, heavy foods. If nausea and/or vomiting occur, drink only clear liquids until the nausea and/or vomiting subsides. Call your physician if vomiting continues.  Special Instructions/Symptoms: Your throat may feel dry or sore from the anesthesia or the  breathing tube placed in your throat during surgery. If this causes discomfort, gargle with warm salt water. The discomfort should disappear within 24 hours.  If you had a scopolamine patch placed behind your ear for the management of post- operative nausea and/or vomiting:  1. The medication in the patch is effective for 72 hours, after which it should be removed.  Wrap patch in a tissue and discard in the trash. Wash hands thoroughly with soap and water. 2. You may remove the patch earlier than 72 hours if you experience unpleasant side effects which may include dry mouth, dizziness or visual disturbances. 3. Avoid touching the patch. Wash your hands with soap and water after contact with the patch.

## 2020-07-19 NOTE — H&P (Signed)
PREOPERATIVE H&P  Chief Complaint: left thumb carpometacarpal osteoarthritis  HPI: Larry [name] is a 69 y.o. male who presents for surgical treatment of left thumb carpometacarpal osteoarthritis.  He denies any changes in medical history.  Past Medical History:  Diagnosis Date  . Arthritis    "knees; left shoulder" (04/12/2013)  . Asthma    "as a child" (04/12/2013)  . DDD (degenerative disc disease), cervical   . DDD (degenerative disc disease), lumbar   . Difficult intubation    2012   CERVICAL FUSION   . Headache(784.0)    VERAPAMIL FOR PREVENTION   . 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 History of chicken pox    as an adult  . Hypoglycemia    last episode 1 yr. ago, just gets jittery  . Hypothyroidism   . Lumbar stenosis   . MRSA (methicillin resistant staph aureus) culture positive 09/10/2019   upper arm  . OSA (obstructive sleep apnea)    "haven't wore mask in years; had OR; still snore" (04/12/2013)  . Urinary frequency    Past Surgical History:  Procedure Laterality Date  . BACK SURGERY    . CERVICAL FUSION  2002; 2012  . CYSTECTOMY    . INGUINAL HERNIA REPAIR Bilateral 1997  . KNEE ARTHROSCOPY Left ~ 1997; ~ 2005  . LUMBAR FUSION  2007; 04/11/2013  . PENILE DEBRIDEMENT  ~ 2011   X 3  FOR MRSA INFECTION    . SHOULDER ARTHROSCOPY Left ~ 2003  . TONSILLECTOMY  1990's  . TOTAL KNEE ARTHROPLASTY Right 07/23/2015  . TOTAL KNEE ARTHROPLASTY Right 07/23/2015   Procedure: TOTAL KNEE ARTHROPLASTY;  Surgeon: Garald Balding, MD;  [facility];  Service: Orthopedics;  Laterality: Right;  . TOTAL KNEE ARTHROPLASTY Left 10/03/2019   Procedure: LEFT TOTAL KNEE ARTHROPLASTY;  Surgeon: Garald Balding, MD;  Location: [facility];  Service: Orthopedics;  Laterality: Left;  . UVULOPALATOPHARYNGOPLASTY (UPPP)/TONSILLECTOMY/SEPTOPLASTY  1990's  . VASECTOMY     Social History    Socioeconomic History  . Marital status: Legally Separated    Spouse name: Not on file  . Number of children: Not on file  . Years of education: Not on file  . Highest education level: Not on file  Occupational History  . Not on file  Tobacco Use  . Smoking status: Former Smoker    Packs/day: 0.50    Years: 15.00    Pack years: 7.50    Types: Cigarettes    Quit date: 10/14/1986    Years since quitting: 33.7  . Smokeless tobacco: Never Used  Vaping Use  . Vaping Use: Never used  Substance and Sexual Activity  . Alcohol use: Yes    Comment: occasional  . Drug use: No  . Sexual activity: Yes  Other Topics Concern  . Not on file  Social History Narrative  . Not on file   Social Determinants of Health   Financial Resource Strain:   . Difficulty of Paying Living Expenses: Not on file  Food Insecurity:   . Worried About Charity fundraiser in the Last Year: Not on file  . Ran Out of Food in the Last Year: Not on file  Transportation Needs:   . Lack of Transportation (Medical): Not on file  . Lack of Transportation (Non-Medical): Not on file  Physical Activity:   . Days of  Exercise per Week: Not on file  . Minutes of Exercise per Session: Not on file  Stress:   . Feeling of Stress : Not on file  Social Connections:   . Frequency of Communication with Friends and Family: Not on file  . Frequency of Social Gatherings with Friends and Family: Not on file  . Attends Religious Services: Not on file  . Active Member of Clubs or Organizations: Not on file  . Attends Archivist Meetings: Not on file  . Marital Status: Not on file   History reviewed. No pertinent family history. No Known Allergies Prior to Admission medications   Medication Sig Start Date End Date Taking? Authorizing Provider  aspirin EC 81 MG tablet Take 81 mg by mouth daily.   Yes [provider]  furosemide (LASIX) 40 MG tablet Take 40 mg by mouth daily as needed for fluid or edema.     Yes [provider]  levothyroxine (SYNTHROID) 200 MCG tablet Take 200 mcg by mouth daily before breakfast.    Yes [provider]  LORazepam (ATIVAN) 0.5 MG tablet Take 0.5-1 mg by mouth at bedtime.  03/14/17  Yes [provider]  losartan (COZAAR) 50 MG tablet Take 50 mg by mouth daily.  07/17/19  Yes [provider]  meloxicam (MOBIC) 15 MG tablet Take 15 mg by mouth daily.  09/27/18  Yes [provider]  Multiple Vitamin (MULTIVITAMIN WITH MINERALS) TABS tablet Take 1 tablet by mouth daily.   Yes [provider]  tamsulosin (FLOMAX) 0.4 MG CAPS capsule Take 0.4 mg by mouth daily.   Yes [provider]  testosterone enanthate (DELATESTRYL) 200 MG/ML injection Inject 200 mg into the muscle every 14 (fourteen) days. Fridays. 05/18/19  Yes [provider]  HYDROcodone-acetaminophen (NORCO/VICODIN) 5-325 MG tablet Take 1 tablet by mouth every 6 (six) hours as needed for moderate pain. 11/02/19   Garald Balding, MD  latanoprost (XALATAN) 0.005 % ophthalmic solution Place 1 drop into both eyes at bedtime. 03/13/17   [provider]  Sildenafil Citrate (VIAGRA PO) Take 30-60 mg by mouth daily as needed (for erectile dysfunction). Purchased from [employer]--dose indicated on website as 30 mg     [provider]  verapamil (CALAN-SR) 180 MG CR tablet Take 180 mg by mouth daily. 12/31/16   [provider]     Positive ROS: All other systems have been reviewed and were otherwise negative with the exception of those mentioned in the HPI and as above.  Physical Exam: General: Alert, no acute distress Cardiovascular: No pedal edema Respiratory: No cyanosis, no use of accessory musculature GI: abdomen soft Skin: No lesions in the area of chief complaint Neurologic: Sensation intact distally Psychiatric: Patient is competent for consent with normal mood and affect Lymphatic: no lymphedema  MUSCULOSKELETAL:  exam stable  Assessment: left thumb carpometacarpal osteoarthritis  Plan: Plan for Procedure(s): LEFT THUMB CARPOMETACARPAL (CMC) ARTHROPLASTY  The risks benefits and alternatives were discussed with the patient including but not limited to the risks of nonoperative treatment, versus surgical intervention including infection, bleeding, nerve injury,  blood clots, cardiopulmonary complications, morbidity, mortality, among others, and they were willing to proceed.   Preoperative templating of the joint replacement has been completed, documented, and submitted to the Operating Room personnel in order to optimize intra-operative equipment management.   Eduard Roux, MD 07/19/2020 8:49 AM

## 2020-07-19 NOTE — Op Note (Addendum)
   DATE OF SURGERY:07/19/2020  PREOPERATIVE DIAGNOSIS: Left thumb basal joint arthritis.  POSTOPERATIVE DIAGNOSIS: same.  PROCEDURE:  1. Left thumb carpometacarpal interposition arthroplasty ([id number]); thumb  2. Left carpometacarpal reconstruction with tendon graft ([id number]).    IMPLANTS: Conmed microlink  SURGEON: N. Eduard Roux, MD  ASSIST: Madalyn Rob, PA-C; necessary for the timely completion of procedure and due to complexity of procedure.  ANESTHESIA: regional block, MAC  TOURNIQUET TIME: see anesthesia record.  BLOOD LOSS: Minimal.  COMPLICATIONS: None.  PATHOLOGY: None.  TIME OUT: A time out was performed before the procedure started.  INDICATIONS FOR PROCEDURE: The patient presents today for the above mentioned procedure after failing extensive conservative measures.  The risks, benefits, and alternatives to surgery were discussed and the patient elected to proceed with surgery.  DESCRIPTION OF PROCEDURE: A Wagner's incision was made. The superficial radial nerve was identified and protected. Radial artery was exposed and protected. We dissected sharply in between the extensor pollicis brevis and abductor pollicis longus tendon interval. Capsulotomy was performed. Capsular flaps were raised. Fluoroscopy was used to identify the borders of the trapezium.  Trapezium was exposed and removed. A second incision was made over the ulnar base of the second metacarpal.  Dissection carried down to the cortex and freer elevator was used to raise the muscle off of the bone.  Using fluoroscopic guidance, a pin was drilled through the drill guide from the second metacarpal base into the thumb metacarpal base.  The microlink device was delivered and the appropriate tension was determined.  Radiographic landmarks were used to assess proper placement of the microlink device.  Thumb range of motion was assessed through abduction, palmar abduction and opposition to make sure the  device was not overtensioned.  I then made a third incision on the volar forearm approximately 10 cm proximal to the flexor wrist crease. Flexor carpi radialis tendon was exposed and transected. The FCR tendon then was delivered out through the Wagner's incision.  The FCR tendon was rolled into an anchovy and sutured with 0 vicryl and placed into the space created by the trapeziectomy.  The wounds were irrigated. The capsular tissue was repaired using 3-0 Vicryl sutures. Then tourniquet was deflated. Hemostasis achieved. Wounds were irrigated and closed using 4-0 nylon sutures. Sterile dressing applied, hand immobilized in a thumb spica splint. The patient then was transferred to the recovery room in stable condition after all counts were correct.  POSTOPERATIVE PLAN: Return in two weeks for suture removal and application of a thumb spica cast. Four weeks after surgery cast will be removed and switch to a thumb spica brace and start hand therapy.

## 2020-07-22 NOTE — Addendum Note (Signed)
Addendum  created 07/22/20 1129 by Tawni Millers, CRNA   Charge Capture section accepted

## 2020-07-24 ENCOUNTER — Telehealth: Payer: Self-pay | Admitting: Orthopaedic Surgery

## 2020-07-24 ENCOUNTER — Encounter (HOSPITAL_BASED_OUTPATIENT_CLINIC_OR_DEPARTMENT_OTHER): Payer: Self-pay | Admitting: Orthopaedic Surgery

## 2020-07-24 NOTE — Telephone Encounter (Signed)
called

## 2020-07-24 NOTE — Telephone Encounter (Signed)
Patient called with questions about upcoming surgery and he has a procedure schedule before surgery and need medical advice. Please call patient at [phone].

## 2020-07-30 DIAGNOSIS — Z1159 Encounter for screening for other viral diseases: Secondary | ICD-10-CM | POA: Diagnosis not present

## 2020-08-02 DIAGNOSIS — D122 Benign neoplasm of ascending colon: Secondary | ICD-10-CM | POA: Diagnosis not present

## 2020-08-02 DIAGNOSIS — Z86010 Personal history of colonic polyps: Secondary | ICD-10-CM | POA: Diagnosis not present

## 2020-08-02 DIAGNOSIS — K5730 Diverticulosis of large intestine without perforation or abscess without bleeding: Secondary | ICD-10-CM | POA: Diagnosis not present

## 2020-08-02 DIAGNOSIS — K621 Rectal polyp: Secondary | ICD-10-CM | POA: Diagnosis not present

## 2020-08-05 ENCOUNTER — Telehealth: Payer: Self-pay | Admitting: Orthopaedic Surgery

## 2020-08-06 ENCOUNTER — Encounter: Payer: Self-pay | Admitting: Orthopaedic Surgery

## 2020-08-06 ENCOUNTER — Other Ambulatory Visit: Payer: Self-pay

## 2020-08-06 ENCOUNTER — Ambulatory Visit (INDEPENDENT_AMBULATORY_CARE_PROVIDER_SITE_OTHER): Payer: Medicare Other

## 2020-08-06 ENCOUNTER — Ambulatory Visit (INDEPENDENT_AMBULATORY_CARE_PROVIDER_SITE_OTHER): Payer: Medicare Other | Admitting: Orthopaedic Surgery

## 2020-08-06 DIAGNOSIS — K621 Rectal polyp: Secondary | ICD-10-CM | POA: Diagnosis not present

## 2020-08-06 DIAGNOSIS — M79642 Pain in left hand: Secondary | ICD-10-CM

## 2020-08-06 DIAGNOSIS — D122 Benign neoplasm of ascending colon: Secondary | ICD-10-CM | POA: Diagnosis not present

## 2020-08-06 DIAGNOSIS — M189 Osteoarthritis of first carpometacarpal joint, unspecified: Secondary | ICD-10-CM

## 2020-08-06 NOTE — Progress Notes (Signed)
Post-Op Visit Note   Patient: Larry [name]           Date of Birth: Sep 28, 1951           MRN: [id number] Visit Date: 08/06/2020 PCP: Merrilee Seashore, MD   Assessment & Plan:  Chief Complaint:  Chief Complaint  Patient presents with  . Left Thumb - Pain, Routine Post Op   Visit Diagnoses:  1. Pain in left hand   2. Osteoarthritis of first carpometacarpal [facility]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     Plan: Patient is a pleasant 69 year old gentleman who comes in today 2 weeks out left thumb CMC arthroplasty.  He has been doing well.  He has been compliant in his thumb spica splint.  He is not on any narcotic pain medication.  Examination of his left upper extremity reveals well-healing surgical incisions without evidence of infection or cellulitis.  He does have mild swelling to his hand.  He is neurovascularly intact distally.  At this point, we will remove his stitches and apply Steri-Strips.  We will place him in a thumb spica cast.  He will follow-up with [place] in 2 weeks time for repeat evaluation and 3 view x-rays of the left hand.  Call with concerns or questions.  Follow-Up Instructions: Return in about 2 weeks (around 08/20/2020).   Orders:  Orders Placed This Encounter  Procedures  . XR Hand Complete Left   No orders of the defined types were placed in this encounter.   Imaging: XR Hand Complete Left  Result Date: 08/06/2020 Status post trapeziectomy.  2nd metacarpal suture button appears to have moved radially.  Overall relationship of 1st and 2nd metacarpals is intact.     PMFS History: Patient Active Problem List   Diagnosis Date Noted  . Complete tear of left rotator cuff 07/09/2020  . Osteoarthritis of first carpometacarpal [facility]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 05/07/2020  . Osteoarthritis of left AC (acromioclavicular) joint 05/07/2020  . Impingement syndrome of left shoulder 02/08/2020  . S/P TKR (total knee replacement) using cement, left 10/03/2019  . Cellulitis of arm  08/28/2019  . Essential hypertension 02/16/2018  . Sepsis due to skin infection ([place]) 02/16/2018  . Chronic pain 02/16/2018  . Cellulitis of left lower extremity   . Lumbar radiculopathy, chronic 01/24/2018  . Lumbar stenosis with neurogenic claudication 03/23/2017  . Hypothyroidism 07/25/2015  . Primary osteoarthritis of right knee 07/23/2015  . Primary osteoarthritis of knee 07/23/2015   Past Medical History:  Diagnosis Date  . Arthritis    "knees; left shoulder" (04/12/2013)  . Asthma    "as a child" (04/12/2013)  . DDD (degenerative disc disease), cervical   . DDD (degenerative disc disease), lumbar   . Difficult intubation    2012   CERVICAL FUSION   . Headache(784.0)    VERAPAMIL FOR PREVENTION   . 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 History of chicken pox    as an adult  . Hypoglycemia    last episode 1 yr. ago, just gets jittery  . Hypothyroidism   . Lumbar stenosis   . MRSA (methicillin resistant staph aureus) culture positive 09/10/2019   upper arm  . OSA (obstructive sleep apnea)    "haven't wore mask in years; had OR; still snore" (04/12/2013)  . Urinary frequency     History reviewed. No pertinent family history.  Past Surgical History:  Procedure Laterality Date  . BACK SURGERY    . CARPOMETACARPEL SUSPENSION PLASTY Left 07/19/2020   Procedure: LEFT THUMB CARPOMETACARPAL ([place]) ARTHROPLASTY;  Surgeon: Leandrew Koyanagi, MD;  Location: [place];  Service: Orthopedics;  Laterality: Left;  . CERVICAL FUSION  2002; 2012  . CYSTECTOMY    . INGUINAL HERNIA REPAIR Bilateral 1997  . KNEE ARTHROSCOPY Left ~ 1997; ~ 2005  . LUMBAR FUSION  2007; 04/11/2013  . PENILE DEBRIDEMENT  ~ 2011   X 3  FOR MRSA INFECTION    . SHOULDER ARTHROSCOPY Left ~ 2003  . TONSILLECTOMY  1990's  . TOTAL KNEE ARTHROPLASTY Right 07/23/2015  . TOTAL KNEE ARTHROPLASTY Right 07/23/2015     Procedure: TOTAL KNEE ARTHROPLASTY;  Surgeon: Garald Balding, MD;  Location: [place];  Service: Orthopedics;  Laterality: Right;  . TOTAL KNEE ARTHROPLASTY Left 10/03/2019   Procedure: LEFT TOTAL KNEE ARTHROPLASTY;  Surgeon: Garald Balding, MD;  Location: [facility];  Service: Orthopedics;  Laterality: Left;  . UVULOPALATOPHARYNGOPLASTY (UPPP)/TONSILLECTOMY/SEPTOPLASTY  1990's  . VASECTOMY     Social History   Occupational History  . Not on file  Tobacco Use  . Smoking status: Former Smoker    Packs/day: 0.50    Years: 15.00    Pack years: 7.50    Types: Cigarettes    Quit date: 10/14/1986    Years since quitting: 33.8  . Smokeless tobacco: Never Used  Vaping Use  . Vaping Use: Never used  Substance and Sexual Activity  . Alcohol use: Yes    Comment: occasional  . Drug use: No  . Sexual activity: Yes

## 2020-08-08 ENCOUNTER — Ambulatory Visit (INDEPENDENT_AMBULATORY_CARE_PROVIDER_SITE_OTHER): Payer: Medicare Other | Admitting: Orthopaedic Surgery

## 2020-08-08 ENCOUNTER — Other Ambulatory Visit: Payer: Self-pay

## 2020-08-08 ENCOUNTER — Encounter: Payer: Self-pay | Admitting: Orthopaedic Surgery

## 2020-08-08 DIAGNOSIS — Z96652 Presence of left artificial knee joint: Secondary | ICD-10-CM | POA: Diagnosis not present

## 2020-08-08 NOTE — Progress Notes (Signed)
Office Visit Note   Patient: Larry [name]           Date of Birth: [birth year]           MRN: [id number] Visit Date: 08/08/2020              Requested by: Merrilee Seashore, [address] [place],  [place] [id number] PCP: Merrilee Seashore, MD   Assessment & Plan: Visit Diagnoses:  1. S/P TKR (total knee replacement) using cement, left     Plan:  #1: No intervention is necessary at this time since the pain has ceased. #2: Continue with his surgery in the future on the left shoulder   Follow-Up Instructions: Return if symptoms worsen or fail to improve.   Incidentally he will be having a rotator cuff repair in 1 week.  Orders:  No orders of the defined types were placed in this encounter.  No orders of the defined types were placed in this encounter.     Procedures: No procedures performed   Clinical Data: No additional findings.   Subjective: Chief Complaint  Patient presents with  . Left Knee - Follow-up    HPI  [name] had a situation of pain 2 weeks ago whenever he was kneeling on his knee. The pain was short-lived for several days and now he has no pain at all.  Review of Systems  Constitutional: Negative for fatigue.  HENT: Negative for ear pain.   Eyes: Negative for pain.  Respiratory: Negative for shortness of breath.   Cardiovascular: Negative for leg swelling.  Gastrointestinal: Negative for constipation and diarrhea.  Endocrine: Negative for cold intolerance and heat intolerance.  Genitourinary: Negative for difficulty urinating.  Musculoskeletal: Negative for joint swelling.  Skin: Negative for rash.  Allergic/Immunologic: Negative for food allergies.  Neurological: Positive for weakness.  Hematological: Does not bruise/bleed easily.  Psychiatric/Behavioral: Negative for sleep disturbance.     Objective: Vital Signs: There were no vitals taken for this visit.  Physical Exam Constitutional:      Appearance:  Normal appearance. He is well-developed and normal weight.  HENT:     Head: Normocephalic.  Eyes:     Extraocular Movements: Extraocular movements intact.     Pupils: Pupils are equal, round, and reactive to light.  Pulmonary:     Effort: Pulmonary effort is normal.  Skin:    General: Skin is warm and dry.  Neurological:     General: No focal deficit present.     Mental Status: He is alert and oriented to person, place, and time.  Psychiatric:        Mood and Affect: Mood normal.        Behavior: Behavior normal.        Thought Content: Thought content normal.        Judgment: Judgment normal.     Ortho Exam  Exam today not much laxity with varus and valgus stressing. Smooth motion without crepitance is noted. Calf is supple and nontender. Well-healed surgical incision. No effusion noted. Specialty Comments:  No specialty comments available.  Imaging: No results found.   PMFS History: Current Outpatient Medications  Medication Sig Dispense Refill  . aspirin EC 81 MG tablet Take 81 mg by mouth daily.    . furosemide (LASIX) 40 MG tablet Take 40 mg by mouth daily as needed for fluid or edema.     Marland Kitchen HYDROcodone-acetaminophen (NORCO/VICODIN) 5-325 MG tablet Take 1 tablet by mouth every 6 (six) hours as  needed for moderate pain. 30 tablet 0  . HYDROmorphone (DILAUDID) 2 MG tablet Take 1 tablet (2 mg total) by mouth every 4 (four) hours as needed for severe pain. 30 tablet 0  . ketorolac (TORADOL) 10 MG tablet Take 1 tablet (10 mg total) by mouth 2 (two) times daily as needed. 10 tablet 0  . levothyroxine (SYNTHROID) 200 MCG tablet Take 200 mcg by mouth daily before breakfast.     . LORazepam (ATIVAN) 0.5 MG tablet Take 0.5-1 mg by mouth at bedtime.   1  . losartan (COZAAR) 50 MG tablet Take 50 mg by mouth daily.     . meloxicam (MOBIC) 15 MG tablet Take 15 mg by mouth daily.     . Multiple Vitamin (MULTIVITAMIN WITH MINERALS) TABS tablet Take 1 tablet by mouth daily.    .  Sildenafil Citrate (VIAGRA PO) Take 30-60 mg by mouth daily as needed (for erectile dysfunction). Purchased from [place]--dose indicated on website as 30 mg     . tamsulosin (FLOMAX) 0.4 MG CAPS capsule Take 0.4 mg by mouth daily.    Marland Kitchen testosterone enanthate (DELATESTRYL) 200 MG/ML injection Inject 200 mg into the muscle every 14 (fourteen) days. Fridays.    . verapamil (CALAN-SR) 180 MG CR tablet Take 180 mg by mouth daily.  15   No current facility-administered medications for this visit.    Patient Active Problem List   Diagnosis Date Noted  . Complete tear of left rotator cuff 07/09/2020  . 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 Osteoarthritis of left AC (acromioclavicular) joint 05/07/2020  . Impingement syndrome of left shoulder 02/08/2020  . S/P TKR (total knee replacement) using cement, left 10/03/2019  . Cellulitis of arm 08/28/2019  . Essential hypertension 02/16/2018  . Sepsis due to skin infection ([place]) 02/16/2018  . Chronic pain 02/16/2018  . Cellulitis of left lower extremity   . Lumbar radiculopathy, chronic 01/24/2018  . Lumbar stenosis with neurogenic claudication 03/23/2017  . Hypothyroidism 07/25/2015  . Primary osteoarthritis of right knee 07/23/2015  . Primary osteoarthritis of knee 07/23/2015   Past Medical History:  Diagnosis Date  . Arthritis    "knees; left shoulder" (04/12/2013)  . Asthma    "as a child" (04/12/2013)  . DDD (degenerative disc disease), cervical   . DDD (degenerative disc disease), lumbar   . Difficult intubation    2012   CERVICAL FUSION   . Headache(784.0)    VERAPAMIL FOR PREVENTION   . 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 History of chicken pox    as an adult  . Hypoglycemia    last episode 1 yr. ago, just gets jittery  . Hypothyroidism   . Lumbar stenosis   . MRSA (methicillin resistant staph  aureus) culture positive 09/10/2019   upper arm  . OSA (obstructive sleep apnea)    "haven't wore mask in years; had OR; still snore" (04/12/2013)  . Urinary frequency     History reviewed. No pertinent family history.  Past Surgical History:  Procedure Laterality Date  . BACK SURGERY    . CARPOMETACARPEL SUSPENSION PLASTY Left 07/19/2020   Procedure: LEFT THUMB CARPOMETACARPAL ([place]) ARTHROPLASTY;  Surgeon: Leandrew Koyanagi, MD;  Location: [place];  Service: Orthopedics;  Laterality: Left;  . CERVICAL FUSION  2002; 2012  . CYSTECTOMY    . INGUINAL HERNIA  REPAIR Bilateral 1997  . KNEE ARTHROSCOPY Left ~ 1997; ~ 2005  . LUMBAR FUSION  2007; 04/11/2013  . PENILE DEBRIDEMENT  ~ 2011   X 3  FOR MRSA INFECTION    . SHOULDER ARTHROSCOPY Left ~ 2003  . TONSILLECTOMY  1990's  . TOTAL KNEE ARTHROPLASTY Right 07/23/2015  . TOTAL KNEE ARTHROPLASTY Right 07/23/2015   Procedure: TOTAL KNEE ARTHROPLASTY;  Surgeon: Garald Balding, MD;  Location: [place];  Service: Orthopedics;  Laterality: Right;  . TOTAL KNEE ARTHROPLASTY Left 10/03/2019   Procedure: LEFT TOTAL KNEE ARTHROPLASTY;  Surgeon: Garald Balding, MD;  [facility];  Service: Orthopedics;  Laterality: Left;  . UVULOPALATOPHARYNGOPLASTY (UPPP)/TONSILLECTOMY/SEPTOPLASTY  1990's  . VASECTOMY     Social History   Occupational History  . Not on file  Tobacco Use  . Smoking status: Former Smoker    Packs/day: 0.50    Years: 15.00    Pack years: 7.50    Types: Cigarettes    Quit date: 10/14/1986    Years since quitting: 33.8  . Smokeless tobacco: Never Used  Vaping Use  . Vaping Use: Never used  Substance and Sexual Activity  . Alcohol use: Yes    Comment: occasional  . Drug use: No  . Sexual activity: Yes

## 2020-08-15 ENCOUNTER — Other Ambulatory Visit: Payer: Self-pay | Admitting: Orthopedic Surgery

## 2020-08-15 DIAGNOSIS — M7542 Impingement syndrome of left shoulder: Secondary | ICD-10-CM | POA: Diagnosis not present

## 2020-08-15 DIAGNOSIS — M7522 Bicipital tendinitis, left shoulder: Secondary | ICD-10-CM | POA: Diagnosis not present

## 2020-08-15 DIAGNOSIS — M19012 Primary osteoarthritis, left shoulder: Secondary | ICD-10-CM | POA: Diagnosis not present

## 2020-08-15 DIAGNOSIS — S46012S Strain of muscle(s) and tendon(s) of the rotator cuff of left shoulder, sequela: Secondary | ICD-10-CM

## 2020-08-15 MED ORDER — HYDROMORPHONE HCL 2 MG PO TABS: 2.0000 mg | ORAL_TABLET | ORAL | 0 refills | Status: AC | PRN

## 2020-08-16 ENCOUNTER — Telehealth: Payer: Self-pay | Admitting: Internal Medicine

## 2020-08-16 ENCOUNTER — Telehealth: Payer: Self-pay | Admitting: *Deleted

## 2020-08-16 NOTE — Telephone Encounter (Signed)
Call from patient requesting assistance with transportation to/from his upcoming appointments. Called [facility] transportation services and arranged patient's 11/24 and 11/30 appointments for transportation at his request. Email to patient with transportation office number for changes or future needs.

## 2020-08-20 ENCOUNTER — Other Ambulatory Visit: Payer: Self-pay

## 2020-08-20 ENCOUNTER — Encounter: Payer: Self-pay | Admitting: Orthopaedic Surgery

## 2020-08-20 ENCOUNTER — Ambulatory Visit (INDEPENDENT_AMBULATORY_CARE_PROVIDER_SITE_OTHER): Payer: No Typology Code available for payment source | Admitting: Orthopaedic Surgery

## 2020-08-20 DIAGNOSIS — M75112 Incomplete rotator cuff tear or rupture of left shoulder, not specified as traumatic: Secondary | ICD-10-CM

## 2020-08-20 DIAGNOSIS — M19019 Primary osteoarthritis, unspecified shoulder: Secondary | ICD-10-CM | POA: Insufficient documentation

## 2020-08-20 DIAGNOSIS — Z9889 Other specified postprocedural states: Secondary | ICD-10-CM

## 2020-08-20 DIAGNOSIS — M19012 Primary osteoarthritis, left shoulder: Secondary | ICD-10-CM

## 2020-08-20 NOTE — Progress Notes (Addendum)
Office Visit Note   Patient: Larry [name]           Date of Birth: [birth year]           MRN: [id number] Visit Date: 08/20/2020              Requested by: Merrilee Seashore, [address],  [place] [id number] PCP: Merrilee Seashore, MD   Assessment & Plan: Visit Diagnoses:  1. Post-operative state   2. Nontraumatic incomplete tear of left rotator cuff   3. Arthritis of left acromioclavicular joint     Plan:  #1: At this time he will begin passive motion of the shoulder.  He was instructed in how to do this #2: He will follow back up in 2 weeks for recheck evaluation #3: PT consult ordered.  Follow-Up Instructions: Return in about 2 weeks (around 09/03/2020) for follow up.   Orders:  Orders Placed This Encounter  Procedures  . Ambulatory referral to Physical Therapy   No orders of the defined types were placed in this encounter.     Procedures: No procedures performed   Clinical Data: No additional findings.   Subjective: Chief Complaint  Patient presents with  . Left Knee - Pain, Routine Post Op    HPI  [name] is now 5 days status post left shoulder SCD DCR mini open rotator cuff repair.  He did have prior to surgery a biceps tendon tear intra-articular.  It was not obtainable for repair.  He is done well and his pain is controlled.  Denies any neurovascular compromise.  Review of Systems   Objective: Vital Signs: There were no vitals taken for this visit.  Physical Exam  Ortho Exam  Exam today reveals the wounds to be healing per primam with no signs of infection or drainage.  Steri-Strips removed and replaced.  Passive motion of the shoulder did not create pain at this time.  Neurovascular intact distally.  Specialty Comments:  No specialty comments available.  Imaging: No results found.   PMFS History: Current Outpatient Medications  Medication Sig Dispense Refill  . aspirin EC 81 MG tablet Take 81 mg by mouth  daily.    . furosemide (LASIX) 40 MG tablet Take 40 mg by mouth daily as needed for fluid or edema.     Marland Kitchen HYDROcodone-acetaminophen (NORCO/VICODIN) 5-325 MG tablet Take 1 tablet by mouth every 6 (six) hours as needed for moderate pain. 30 tablet 0  . HYDROmorphone (DILAUDID) 2 MG tablet Take 1 tablet (2 mg total) by mouth every 4 (four) hours as needed for severe pain. 30 tablet 0  . HYDROmorphone (DILAUDID) 2 MG tablet Take 1 tablet (2 mg total) by mouth every 4 (four) hours as needed for up to 5 days for severe pain. 30 tablet 0  . ketorolac (TORADOL) 10 MG tablet Take 1 tablet (10 mg total) by mouth 2 (two) times daily as needed. 10 tablet 0  . levothyroxine (SYNTHROID) 200 MCG tablet Take 200 mcg by mouth daily before breakfast.     . LORazepam (ATIVAN) 0.5 MG tablet Take 0.5-1 mg by mouth at bedtime.   1  . losartan (COZAAR) 50 MG tablet Take 50 mg by mouth daily.     . meloxicam (MOBIC) 15 MG tablet Take 15 mg by mouth daily.     . Multiple Vitamin (MULTIVITAMIN WITH MINERALS) TABS tablet Take 1 tablet by mouth daily.    . Sildenafil Citrate (VIAGRA PO) Take 30-60 mg by  mouth daily as needed (for erectile dysfunction). Purchased from [place]--dose indicated on website as 30 mg     . tamsulosin (FLOMAX) 0.4 MG CAPS capsule Take 0.4 mg by mouth daily.    Marland Kitchen testosterone enanthate (DELATESTRYL) 200 MG/ML injection Inject 200 mg into the muscle every 14 (fourteen) days. Fridays.    . verapamil (CALAN-SR) 180 MG CR tablet Take 180 mg by mouth daily.  15   No current facility-administered medications for this visit.     Patient Active Problem List   Diagnosis Date Noted  . Post-operative state 08/20/2020  . Nontraumatic incomplete tear of left rotator cuff 08/20/2020  . AC (acromioclavicular) arthritis 08/20/2020  . Complete tear of left rotator cuff 07/09/2020  . Osteoarthritis of first carpometacarpal [facility]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 05/07/2020  . Osteoarthritis of left AC (acromioclavicular)  joint 05/07/2020  . Impingement syndrome of left shoulder 02/08/2020  . S/P TKR (total knee replacement) using cement, left 10/03/2019  . Cellulitis of arm 08/28/2019  . Essential hypertension 02/16/2018  . Sepsis due to skin infection ([place]) 02/16/2018  . Chronic pain 02/16/2018  . Cellulitis of left lower extremity   . Lumbar radiculopathy, chronic 01/24/2018  . Lumbar stenosis with neurogenic claudication 03/23/2017  . Hypothyroidism 07/25/2015  . Primary osteoarthritis of right knee 07/23/2015  . Primary osteoarthritis of knee 07/23/2015   Past Medical History:  Diagnosis Date  . Arthritis    "knees; left shoulder" (04/12/2013)  . Asthma    "as a child" (04/12/2013)  . DDD (degenerative disc disease), cervical   . DDD (degenerative disc disease), lumbar   . Difficult intubation    2012   CERVICAL FUSION   . Headache(784.0)    VERAPAMIL FOR PREVENTION   . 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 History of chicken pox    as an adult  . Hypoglycemia    last episode 1 yr. ago, just gets jittery  . Hypothyroidism   . Lumbar stenosis   . MRSA (methicillin resistant staph aureus) culture positive 09/10/2019   upper arm  . OSA (obstructive sleep apnea)    "haven't wore mask in years; had OR; still snore" (04/12/2013)  . Urinary frequency     History reviewed. No pertinent family history.  Past Surgical History:  Procedure Laterality Date  . BACK SURGERY    . CARPOMETACARPEL SUSPENSION PLASTY Left 07/19/2020   Procedure: LEFT THUMB CARPOMETACARPAL ([place]) ARTHROPLASTY;  Surgeon: Leandrew Koyanagi, MD;  Location: ;  Service: Orthopedics;  Laterality: Left;  . CERVICAL FUSION  2002; 2012  . CYSTECTOMY    . INGUINAL HERNIA REPAIR Bilateral 1997  . KNEE ARTHROSCOPY Left ~ 1997; ~ 2005  . LUMBAR FUSION  2007; 04/11/2013  . PENILE DEBRIDEMENT  ~ 2011   X 3  FOR MRSA  INFECTION    . SHOULDER ARTHROSCOPY Left ~ 2003  . TONSILLECTOMY  1990's  . TOTAL KNEE ARTHROPLASTY Right 07/23/2015  . TOTAL KNEE ARTHROPLASTY Right 07/23/2015   Procedure: TOTAL KNEE ARTHROPLASTY;  Surgeon: Garald Balding, MD;  Location: [place];  Service: Orthopedics;  Laterality: Right;  . TOTAL KNEE ARTHROPLASTY Left 10/03/2019   Procedure: LEFT TOTAL KNEE ARTHROPLASTY;  Surgeon: Garald Balding, MD;  [facility];  Service: Orthopedics;  Laterality: Left;  . UVULOPALATOPHARYNGOPLASTY (UPPP)/TONSILLECTOMY/SEPTOPLASTY  1990's  . VASECTOMY     Social History  Occupational History  . Not on file  Tobacco Use  . Smoking status: Former Smoker    Packs/day: 0.50    Years: 15.00    Pack years: 7.50    Types: Cigarettes    Quit date: 10/14/1986    Years since quitting: 33.8  . Smokeless tobacco: Never Used  Vaping Use  . Vaping Use: Never used  Substance and Sexual Activity  . Alcohol use: Yes    Comment: occasional  . Drug use: No  . Sexual activity: Yes

## 2020-08-21 ENCOUNTER — Encounter: Payer: Self-pay | Admitting: Orthopaedic Surgery

## 2020-08-21 ENCOUNTER — Ambulatory Visit (INDEPENDENT_AMBULATORY_CARE_PROVIDER_SITE_OTHER): Payer: Medicare Other

## 2020-08-21 ENCOUNTER — Ambulatory Visit (INDEPENDENT_AMBULATORY_CARE_PROVIDER_SITE_OTHER): Payer: Medicare Other | Admitting: Orthopaedic Surgery

## 2020-08-21 ENCOUNTER — Other Ambulatory Visit: Payer: Self-pay | Admitting: Orthopaedic Surgery

## 2020-08-21 ENCOUNTER — Ambulatory Visit: Payer: Self-pay

## 2020-08-21 DIAGNOSIS — S63055A Dislocation of other carpometacarpal joint of left hand, initial encounter: Secondary | ICD-10-CM | POA: Diagnosis not present

## 2020-08-21 DIAGNOSIS — M79641 Pain in right hand: Secondary | ICD-10-CM | POA: Diagnosis not present

## 2020-08-21 DIAGNOSIS — M25541 Pain in joints of right hand: Secondary | ICD-10-CM

## 2020-08-21 MED ORDER — HYDROMORPHONE HCL 2 MG PO TABS: 2.0000 mg | ORAL_TABLET | Freq: Four times a day (QID) | ORAL | 0 refills | Status: DC | PRN

## 2020-08-21 NOTE — Telephone Encounter (Signed)
I refilled dilaudid today

## 2020-08-21 NOTE — Progress Notes (Signed)
Office Visit Note   Patient: Larry [name]           Date of Birth: [birth year]           MRN: [id number] Visit Date: 08/21/2020              Requested by: Larry [name], [address] [place] [place],  [place] [address] PCP: Larry [name]   Assessment & Plan: Visit Diagnoses:  1. CMC (carpometacarpal joint) dislocation, left, initial encounter   2. Pain in right hand     Plan: I impression is 4 weeks status post left thumb CMC arthroplasty and right hand pain and swelling.  From the surgical standpoint he is doing well we will provide him with a thumb spica brace today and I have made a referral to hand therapy.  For the right hand I think he has got some underlying inflammation and swelling but I have recommended resuming his meloxicam which I think will help.  Would like to recheck him in 6 weeks for follow-up of his left hand CMC arthroplasty.  Follow-Up Instructions: Return in about 6 weeks (around 10/02/2020).   Orders:  Orders Placed This Encounter  Procedures  . XR Hand Complete Left  . XR Hand Complete Right  . Ambulatory referral to Occupational Therapy   No orders of the defined types were placed in this encounter.     Procedures: No procedures performed   Clinical Data: No additional findings.   Subjective: Chief Complaint  Patient presents with  . Left Thumb - Pain  . Right Hand - Pain    Larry [name] is 4 weeks status post left [facility] arthroplasty and doing well.  Pain is overall mild.  He did undergo rotator cuff surgery recently with Dr. Durward Fortes.  He is also asking to be evaluated for separate issue of right hand pain and tightness.  He usually takes meloxicam but has had to stop this due to recent surgeries.  Denies any injuries or history of gout.   Review of Systems  Constitutional: Negative.   All other systems reviewed and are negative.    Objective: Vital Signs: There were no vitals taken for this visit.  Physical  Exam Vitals and nursing note reviewed.  Constitutional:      Appearance: He is well-developed.  Pulmonary:     Effort: Pulmonary effort is normal.  Abdominal:     Palpations: Abdomen is soft.  Skin:    General: Skin is warm.  Neurological:     Mental Status: He is alert and oriented to person, place, and time.  Psychiatric:        Behavior: Behavior normal.        Thought Content: Thought content normal.        Judgment: Judgment normal.     Ortho Exam Left hand shows fully healed surgical scars.  Moderate swelling.  No neurovascular compromise.  Right hand shows no temperature changes or warmth or redness.  He has some moderate swelling of the index finger. Specialty Comments:  No specialty comments available.  Imaging: XR Hand Complete Left  Result Date: 08/21/2020 Stable appearance status post [facility] arthroplasty.  Stable tight rope suture buttons  XR Hand Complete Right  Result Date: 08/21/2020 No acute or structural abnormalities.    PMFS History: Patient Active Problem List   Diagnosis Date Noted  . Post-operative state 08/20/2020  . Nontraumatic incomplete tear of left rotator cuff 08/20/2020  . AC (acromioclavicular) arthritis 08/20/2020  .  Complete tear of left rotator cuff 07/09/2020  . 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 Osteoarthritis of left AC (acromioclavicular) joint 05/07/2020  . Impingement syndrome of left shoulder 02/08/2020  . S/P TKR (total knee replacement) using cement, left 10/03/2019  . Cellulitis of arm 08/28/2019  . Essential hypertension 02/16/2018  . Sepsis due to skin infection ([place]) 02/16/2018  . Chronic pain 02/16/2018  . Cellulitis of left lower extremity   . Lumbar radiculopathy, chronic 01/24/2018  . Lumbar stenosis with neurogenic claudication 03/23/2017  . Hypothyroidism 07/25/2015  . Primary osteoarthritis of right knee 07/23/2015  . Primary osteoarthritis of knee 07/23/2015    Past Medical History:  Diagnosis Date  . Arthritis    "knees; left shoulder" (04/12/2013)  . Asthma    "as a child" (04/12/2013)  . DDD (degenerative disc disease), cervical   . DDD (degenerative disc disease), lumbar   . Difficult intubation    2012   CERVICAL FUSION   . Headache(784.0)    VERAPAMIL FOR PREVENTION   . 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 History of chicken pox    as an adult  . Hypoglycemia    last episode 1 yr. ago, just gets jittery  . Hypothyroidism   . Lumbar stenosis   . MRSA (methicillin resistant staph aureus) culture positive 09/10/2019   upper arm  . OSA (obstructive sleep apnea)    "haven't wore mask in years; had OR; still snore" (04/12/2013)  . Urinary frequency     History reviewed. No pertinent family history.  Past Surgical History:  Procedure Laterality Date  . BACK SURGERY    . CARPOMETACARPEL SUSPENSION PLASTY Left 07/19/2020   Procedure: LEFT THUMB CARPOMETACARPAL ([place]) ARTHROPLASTY;  Surgeon: Leandrew Koyanagi, [name];  Location: [place];  Service: Orthopedics;  Laterality: Left;  . CERVICAL FUSION  2002; 2012  . CYSTECTOMY    . INGUINAL HERNIA REPAIR Bilateral 1997  . KNEE ARTHROSCOPY Left ~ 1997; ~ 2005  . LUMBAR FUSION  2007; 04/11/2013  . PENILE DEBRIDEMENT  ~ 2011   X 3  FOR MRSA INFECTION    . SHOULDER ARTHROSCOPY Left ~ 2003  . TONSILLECTOMY  1990's  . TOTAL KNEE ARTHROPLASTY Right 07/23/2015  . TOTAL KNEE ARTHROPLASTY Right 07/23/2015   Procedure: TOTAL KNEE ARTHROPLASTY;  Surgeon: Garald Balding, [name];  Location: [place];  Service: Orthopedics;  Laterality: Right;  . TOTAL KNEE ARTHROPLASTY Left 10/03/2019   Procedure: LEFT TOTAL KNEE ARTHROPLASTY;  Surgeon: Garald Balding, [name];  [facility];  Service: Orthopedics;  Laterality: Left;  . UVULOPALATOPHARYNGOPLASTY (UPPP)/TONSILLECTOMY/SEPTOPLASTY  1990's  . VASECTOMY      Social History   Occupational History  . Not on file  Tobacco Use  . Smoking status: Former Smoker    Packs/day: 0.50    Years: 15.00    Pack years: 7.50    Types: Cigarettes    Quit date: 10/14/1986    Years since quitting: 33.8  . Smokeless tobacco: Never Used  Vaping Use  . Vaping Use: Never used  Substance and Sexual Activity  . Alcohol use: Yes    Comment: occasional  . Drug use: No  . Sexual activity: Yes

## 2020-08-21 NOTE — Telephone Encounter (Signed)
[place] for both-sent to patient

## 2020-08-27 ENCOUNTER — Inpatient Hospital Stay: Payer: Medicare Other | Admitting: Orthopaedic Surgery

## 2020-08-27 ENCOUNTER — Ambulatory Visit: Payer: No Typology Code available for payment source | Admitting: Physical Therapy

## 2020-08-29 ENCOUNTER — Ambulatory Visit: Payer: Medicare Other | Attending: Orthopaedic Surgery | Admitting: Occupational Therapy

## 2020-08-29 ENCOUNTER — Other Ambulatory Visit: Payer: Self-pay

## 2020-08-29 DIAGNOSIS — R600 Localized edema: Secondary | ICD-10-CM | POA: Diagnosis not present

## 2020-08-29 DIAGNOSIS — M79642 Pain in left hand: Secondary | ICD-10-CM

## 2020-08-29 DIAGNOSIS — M25642 Stiffness of left hand, not elsewhere classified: Secondary | ICD-10-CM

## 2020-08-29 DIAGNOSIS — M6281 Muscle weakness (generalized): Secondary | ICD-10-CM | POA: Diagnosis not present

## 2020-08-29 NOTE — Patient Instructions (Signed)
WEARING SCHEDULE:  Wear splint at ALL times except for hygiene care and exercises (May remove splint for exercises and then immediately place back on .) PURPOSE:  To prevent movement and for protection until injury can heal  CARE OF SPLINT:  Keep splint away from heat sources including: stove, radiator or furnace, or a car in sunlight. The splint can melt and will no longer fit you properly  Keep away from pets and children  Clean the splint with rubbing alcohol 1-2 times per day.  * During this time, make sure you also clean your hand/arm as instructed by your therapist and/or perform dressing changes as needed. Then dry hand/arm completely before replacing splint. (When cleaning hand/arm, keep it immobilized in same position until splint is replaced)  PRECAUTIONS/POTENTIAL PROBLEMS: *If you notice or experience increased pain, swelling, numbness, or a lingering reddened area from the splint: Contact your therapist immediately by calling [phone]. You must wear the splint for protection, but we will get you scheduled for adjustments as quickly as possible.  (If only straps or hooks need to be replaced and NO adjustments to the splint need to be made, just call the office ahead and let them know you are coming in)  If you have any medical concerns or signs of infection, please call your doctor immediately     Opposition (Active)   Touch tip of thumb to nail tip of each finger in turn, making an "O" shape. Repeat __10__ times. Do _4-6___ sessions per day.   MP Flexion (Active)   Bend thumb to touch base of little finger, keeping tip joint straight. Repeat __10-15__ times. Do _4-6___ sessions per day.       IP Flexion (Active Blocked)   Brace thumb below tip joint. Bend joint as far as possible. Repeat __10__ times. Do _4-6___ sessions per day.   Composite Extension (Active)   Bring thumb up and out in hitchhiker position.  Repeat __10-15__ times. Do _4-6___ sessions  per day.   Flexor Tendon Gliding (Active Hook Fist)   With fingers and knuckles straight, bend middle and tip joints. Do not bend large knuckles. Repeat _10-15___ times. Do _4-6___ sessions per day.  MP Flexion (Active)   With back of hand on table, bend large knuckles as far as they will go, keeping small joints straight. Repeat _10-15___ times. Do __4-6__ sessions per day. Activity: Reach into a narrow container.*      Finger Flexion / Extension   With palm up, bend fingers of left hand toward palm, making a  fist. Straighten fingers, opening fist. Repeat sequence _10-15___ times per session. Do _4-6__ sessions per day. Hand Variation: Palm down

## 2020-08-30 ENCOUNTER — Telehealth: Payer: Self-pay | Admitting: Radiology

## 2020-08-30 ENCOUNTER — Encounter: Payer: Self-pay | Admitting: Occupational Therapy

## 2020-08-30 NOTE — Therapy (Signed)
[facility] [address] [place], [place], [address] Phone: [phone]   Fax:  [phone]  Occupational Therapy Evaluation  Patient Details  Name: Larry [name] MRN: [id number] Date of Birth: 05-07-1951 Referring Provider (OT): Dr. Erlinda Hong   Encounter Date: 08/29/2020   OT End of Session - 08/30/20 1028    Visit Number 1    Number of Visits 17    Date for OT Re-Evaluation 10/25/20    Authorization Type Medicare    Authorization Time Period cert for 90 days    Authorization - Visit Number 1    Authorization - Number of Visits 10    OT Start Time 1105    OT Stop Time 1230    OT Time Calculation (min) 85 min    Activity Tolerance Patient tolerated treatment well    Behavior During Therapy [facility] for tasks assessed/performed           Past Medical History:  Diagnosis Date  . Arthritis    "knees; left shoulder" (04/12/2013)  . Asthma    "as a child" (04/12/2013)  . DDD (degenerative disc disease), cervical   . DDD (degenerative disc disease), lumbar   . Difficult intubation    2012   CERVICAL FUSION   . Headache(784.0)    VERAPAMIL FOR PREVENTION   . 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 History of chicken pox    as an adult  . Hypoglycemia    last episode 1 yr. ago, just gets jittery  . Hypothyroidism   . Lumbar stenosis   . MRSA (methicillin resistant staph aureus) culture positive 09/10/2019   upper arm  . OSA (obstructive sleep apnea)    "haven't wore mask in years; had OR; still snore" (04/12/2013)  . Urinary frequency     Past Surgical History:  Procedure Laterality Date  . BACK SURGERY    . CARPOMETACARPEL SUSPENSION PLASTY Left 07/19/2020   Procedure: LEFT THUMB CARPOMETACARPAL ([place]) ARTHROPLASTY;  Surgeon: Leandrew Koyanagi, MD;  Location: [facility];  Service: Orthopedics;  Laterality: Left;    . CERVICAL FUSION  2002; 2012  . CYSTECTOMY    . INGUINAL HERNIA REPAIR Bilateral 1997  . KNEE ARTHROSCOPY Left ~ 1997; ~ 2005  . LUMBAR FUSION  2007; 04/11/2013  . PENILE DEBRIDEMENT  ~ 2011   X 3  FOR MRSA INFECTION    . SHOULDER ARTHROSCOPY Left ~ 2003  . TONSILLECTOMY  1990's  . TOTAL KNEE ARTHROPLASTY Right 07/23/2015  . TOTAL KNEE ARTHROPLASTY Right 07/23/2015   Procedure: TOTAL KNEE ARTHROPLASTY;  Surgeon: Garald Balding, MD;  Location: [facility];  Service: Orthopedics;  Laterality: Right;  . TOTAL KNEE ARTHROPLASTY Left 10/03/2019   Procedure: LEFT TOTAL KNEE ARTHROPLASTY;  Surgeon: Garald Balding, MD;  Location: [facility];  Service: Orthopedics;  Laterality: Left;  . UVULOPALATOPHARYNGOPLASTY (UPPP)/TONSILLECTOMY/SEPTOPLASTY  1990's  . VASECTOMY      There were no vitals filed for this visit.   Subjective Assessment - 08/30/20 1122    Subjective  Pt reports recent shoulder surgery as well as thumb surgery    Pertinent History s/p Left thumb carpometacarpal interposition arthroplasty, thumb  Left carpometacarpal reconstruction with tendon graft 07/19/20.      Pt is hypothyroidism, OA, asthma, back pain s/p lumbar fusion and cervical fusion, recent rotator cuff repair by Dr. Durward Fortes - PT  is addressing.    Patient Stated Goals regain use of hand    Currently in Pain? Yes    Pain Score 4     Pain Location Hand    Pain Orientation Left    Pain Descriptors / Indicators Aching    Pain Type Acute pain    Pain Onset More than a month ago    Pain Frequency Intermittent    Aggravating Factors  movement    Pain Relieving Factors rest             [facility] OT Assessment - 08/30/20 1054      Assessment   Medical Diagnosis L thumb CMC interposition arthroplasty, thumb CMC reconstruction with tendon graft    Referring Provider (OT) Dr. Erlinda Hong    Onset Date/Surgical Date 07/19/20    Hand Dominance Right      Precautions   Precautions Other (comment)    Precaution Comments splint  when not exercising, short opponens for day, pre-fab thumb spica for night, followCMC arthroplasty protocol.      Restrictions   Weight Bearing Restrictions Yes    LUE Weight Bearing --   no heavy use of LUE     Home  Environment   Family/patient expects to be discharged to: Private residence    Lives With Spouse      ADL   ADL comments Pt is currently using RUE for ADL perforrmance.      Mobility   Mobility Status Independent      Written Expression   Dominant Hand Right      Cognition   Overall Cognitive Status Within Functional Limits for tasks assessed      Sensation   Light Touch Appears Intact      Coordination   Fine Motor Movements are Fluid and Coordinated No    Coordination impaired due to ROM limitations      Edema   Edema mild LUE, incision is closed at [facility] and appears to be healing well.      ROM / Strength   AROM / PROM / Strength AROM      AROM   Overall AROM  Deficits    Overall AROM Comments wrist flexion/ extention 30/50      Left Hand AROM   L Thumb MCP 0-60 20 Degrees    L Thumb IP 0-80 60 Degrees    L Thumb Radial ADduction/ABduction 0-55 20-50    L Thumb Palmar ADduction/ABduction 0-45 25-60    L Thumb Opposition to Index --   able to oppose digits 2-4, can't reach 5th digit     Hand Function   Left Hand Grip (lbs) --   not tested due to precautions                   OT Treatments/Exercises (OP) - 08/30/20 0001      Splinting   Splinting Pt arrived wear and sling and a pre-fab thumb spica brace for left hand. Pt's sling and brace were removed. Pt was fitted with a custom short opponens splint. Pt was instructed in splint wear, care and precatuions. Pt was instructed to wear custom splint during daytime and pre-fab splint at night. Pt was instructe in gentle A/ROM exercises. Pt's incision site is closed and appears to be healing well.                 OT Education - 08/30/20 1052    Education Details splint wear, care and  precautions, inital AROM  HEP-pt was instructed to perform slowly and gently and not to strain    [employer]) Educated Patient    Methods Explanation;Demonstration;Verbal cues;Handout    Comprehension Verbalized understanding;Returned demonstration;Verbal cues required            OT Short Term Goals - 08/30/20 1114      OT SHORT TERM GOAL #1   Title I with inital HEP.    Time 4    Period Weeks    Status New      OT SHORT TERM GOAL #2   Title I with splint wear, care and precautions    Time 4    Period Weeks    Status New      OT SHORT TERM GOAL #3   Title Pt will increase L wrist flexion to 40* for increased functional use.    Time 4    Period Weeks    Status New      OT SHORT TERM GOAL #4   Title Pt will demonstrate ability to oppose all digits in prep for functional use of LUE.    Time 4    Period Weeks    Status New      OT SHORT TERM GOAL #5   Title Pt will demonstrate full composite finger flexion in prep for ADLs.    Baseline 95%    Time 8    Period Weeks    Status New      OT SHORT TERM GOAL #6   Title I with edema control and scar massage    Time 4    Period Weeks    Status New             OT Long Term Goals - 08/30/20 1116      OT LONG TERM GOAL #1   Title I with updated HEP.    Time 8    Period Weeks    Status New      OT LONG TERM GOAL #2   Title Pt will demonstrate grip strength of at least 30 lbs for ADL performance.    Time 8    Period Weeks    Status New      OT LONG TERM GOAL #3   Title Pt will resume use of LUE as a non dominant assist at least 90% of the time with pain less than or equal to 3/10.    Time 8    Period Weeks    Status New      OT LONG TERM GOAL #4   Title Pt will demonstrate adequate 3 pt pinch strength in order  to place and remove an 8 lbs clip from antennae.    Baseline not tested due to precautions    Time 8    Period Weeks    Status New                 Plan - 08/30/20 1045    Clinical  Impression Statement Pt is s/p Left thumb carpometacarpal interposition arthroplasty, thumb  Left carpometacarpal reconstruction with tendon graft 07/19/20 by Dr. Erlinda Hong.  Pt was immobilized for the first four weeks s/p surgery, however pt reports performing some gentle movement on his own.   PMH is significant for : Hypothyroidism, OA, asthma, back pain s/p lumbar fusion and cervical fusion, recent rotator cuff repair by Dr. Durward Fortes -( PT is addressing.) Pt presents to occupational therapy with the following deficits: pain, decreased ROM, decreased strength, decreased LUE functional use which impedes  perfromance of ADLs/IADLs. Pt can benefit from continued skilled occupational therapy to address these deficits in order to maximize pt's safety and I with daily activities. OT will not address pt's left shoulder surgery as this is being addressed by P.T. Pt was working a [employer] improvement prior to his surgery.    OT Occupational Profile and History Detailed Assessment- Review of Records and additional review of physical, cognitive, psychosocial history related to current functional performance    Occupational performance deficits (Please refer to evaluation for details): ADL's;IADL's;Rest and Sleep;Work;Play;Leisure;Social Participation    Body Structure / Function / Physical Skills ADL;Dexterity;GMC;Pain;Strength;UE functional use;ROM;IADL;Endurance;Coordination;Flexibility;Mobility;Decreased knowledge of precautions;FMC;Sensation;Scar mobility    Rehab Potential Good    Clinical Decision Making Limited treatment options, no task modification necessary    Comorbidities Affecting Occupational Performance: May have comorbidities impacting occupational performance    Modification or Assistance to Complete Evaluation  No modification of tasks or assist necessary to complete eval    OT Frequency 2x / week    OT Duration 8 weeks   plus eval   OT Treatment/Interventions Self-care/ADL training;Moist  Heat;Fluidtherapy;DME and/or AE instruction;Splinting;Therapeutic activities;Contrast Bath;Ultrasound;Therapeutic exercise;Scar mobilization;Passive range of motion;Neuromuscular education;Cryotherapy;Electrical Stimulation;Paraffin;Energy conservation;Manual Therapy;Patient/family education    Plan splint check and modifications prn, review A/ROM exercises, start in fluidotherapy?, progress per [facility] arthroplasty protocol.    Consulted and Agree with Plan of Care Patient           Patient will benefit from skilled therapeutic intervention in order to improve the following deficits and impairments:   Body Structure / Function / Physical Skills: ADL, Dexterity, GMC, Pain, Strength, UE functional use, ROM, IADL, Endurance, Coordination, Flexibility, Mobility, Decreased knowledge of precautions, FMC, Sensation, Scar mobility       Visit Diagnosis: Pain in left hand - Plan: Ot plan of care cert/re-cert  Stiffness of left hand, not elsewhere classified - Plan: Ot plan of care cert/re-cert  Muscle weakness (generalized) - Plan: Ot plan of care cert/re-cert  Localized edema - Plan: Ot plan of care cert/re-cert    Problem List Patient Active Problem List   Diagnosis Date Noted  . Post-operative state 08/20/2020  . Nontraumatic incomplete tear of left rotator cuff 08/20/2020  . AC (acromioclavicular) arthritis 08/20/2020  . Complete tear of left rotator cuff 07/09/2020  . 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 Osteoarthritis of left AC (acromioclavicular) joint 05/07/2020  . Impingement syndrome of left shoulder 02/08/2020  . S/P TKR (total knee replacement) using cement, left 10/03/2019  . Cellulitis of arm 08/28/2019  . Essential hypertension 02/16/2018  . Sepsis due to skin infection ([place]) 02/16/2018  . Chronic pain 02/16/2018  . Cellulitis of left lower extremity   . Lumbar radiculopathy, chronic 01/24/2018  . Lumbar stenosis with neurogenic  claudication 03/23/2017  . Hypothyroidism 07/25/2015  . Primary osteoarthritis of right knee 07/23/2015  . Primary osteoarthritis of knee 07/23/2015    Keyler Hoge 08/30/2020, 11:27 AM Theone Murdoch, OTR/L [phone] Phone: [phone] 11:27 AM 08/30/20 [facility] [address] [place], [place], [address] Phone: [phone]   Fax:  [phone]  Name: JAVANTE NILSSON MRN: [id number] Date of Birth: [birth year]

## 2020-08-30 NOTE — Telephone Encounter (Signed)
Received call from Santiago Glad with [facility] requesting copy of OP note and surgery protocol for patient's shoulder. Spoke with Beverely Risen who states PROM x 6 weeks. OP note and rx with protocol faxed to Santiago Glad at [phone].  CB for Santiago Glad [phone]

## 2020-09-04 ENCOUNTER — Encounter: Payer: Self-pay | Admitting: *Deleted

## 2020-09-04 ENCOUNTER — Encounter: Payer: Self-pay | Admitting: Orthopaedic Surgery

## 2020-09-04 ENCOUNTER — Ambulatory Visit: Payer: Medicare Other | Admitting: *Deleted

## 2020-09-04 ENCOUNTER — Other Ambulatory Visit: Payer: Self-pay

## 2020-09-04 ENCOUNTER — Ambulatory Visit (INDEPENDENT_AMBULATORY_CARE_PROVIDER_SITE_OTHER): Payer: No Typology Code available for payment source | Admitting: Orthopaedic Surgery

## 2020-09-04 VITALS — Ht 66.0 in | Wt 184.0 lb

## 2020-09-04 DIAGNOSIS — M79642 Pain in left hand: Secondary | ICD-10-CM | POA: Diagnosis not present

## 2020-09-04 DIAGNOSIS — M25642 Stiffness of left hand, not elsewhere classified: Secondary | ICD-10-CM | POA: Diagnosis not present

## 2020-09-04 DIAGNOSIS — M6281 Muscle weakness (generalized): Secondary | ICD-10-CM

## 2020-09-04 DIAGNOSIS — R600 Localized edema: Secondary | ICD-10-CM | POA: Diagnosis not present

## 2020-09-04 DIAGNOSIS — S46012A Strain of muscle(s) and tendon(s) of the rotator cuff of left shoulder, initial encounter: Secondary | ICD-10-CM

## 2020-09-04 MED ORDER — TRAMADOL HCL 50 MG PO TABS: 50.0000 mg | ORAL_TABLET | Freq: Two times a day (BID) | ORAL | 0 refills | Status: DC | PRN

## 2020-09-04 NOTE — Patient Instructions (Signed)
Do these exercises in addition to the exercises that you were given previously.  IP Flexion (Active Blocked)    Brace thumb below tip joint. Bend joint as far as possible. Repeat __10__ times. Do _3-4___ sessions per day.    AROM: Thumb Flexion / Extension    Actively bend right thumb across palm as far as possible. Hold _3-5___ seconds. Relax. Then pull thumb back into hitchhike position. Repeat _10__ times per set. Do __3-4__ sessions per day.  MP Flexion (Active Blocked)    Using other hand to brace base of thumb, bend as far as possible with tip joint held straight. Repeat _10___ times. Do _3-4__ sessions per day.  Opposition (Active)    Touch tip of thumb to nail tip of each finger in turn, making an "O" shape. Repeat _10_ times. Do _3-4__ sessions per day.    Massage the scars on your thumb/hand and forearm as described and performed in clinic. You can also do the massage that helps with swelling (start at the tip of the thumb and massage down past your wrist).    Wear the short/custom splint during day for activities of daily living (cooking, cleaning, bathing, dressing, eating etc) but you may now take it off when relaxing during the day. Wear the other splint at night when you sleep.

## 2020-09-04 NOTE — Progress Notes (Signed)
Office Visit Note   Patient: Larry [name]           Date of Birth: [birth year]           MRN: [id number] Visit Date: 09/04/2020              Requested by: Merrilee Seashore, [place],  [place] [address] PCP: Merrilee Seashore, MD   Assessment & Plan: Visit Diagnoses:  1. Traumatic complete tear of left rotator cuff, initial encounter     Plan: 3 weeks status post rotator cuff tear repair left shoulder with biceps tenodesis and DCR and SCD.  Doing well.  Will need some pain meds and will try tramadol.  Has been going to physical therapy and will continue so for 6 weeks at least.  Continue with the sling for a total of 6 weeks.  No work Follow-Up Instructions: Return in about 2 weeks (around 09/18/2020).   Orders:  No orders of the defined types were placed in this encounter.  No orders of the defined types were placed in this encounter.     Procedures: No procedures performed   Clinical Data: No additional findings.   Subjective: Chief Complaint  Patient presents with  . Left Shoulder - Follow-up    Left shoulder arthroscopy 08/15/2020  Patient presents today for a two week follow up on his left shoulder. He had a left shoulder arthroscopy with DCR,SCD, and rotator cuff tear repair on 08/15/2020. He is now three weeks out from surgery. Patient states that he is doing okay. He has been to two physical therapy sessions thus far. He is wearing his sling. He takes hydromorphone at night, and Vicodin during the day if needed. He wants to get a refill of his hydromorphone.   HPI  Review of Systems   Objective: Vital Signs: Ht 5\' 6"  (1.676 m)   Wt 184 lb (83.5 kg)   BMI 29.70 kg/m   Physical Exam  Ortho Exam left shoulder incisions healing without problem.  Good grip and good release.  Has had surgery on his left thumb is in a splint.  Good capillary refill to the fingers.  I did not check range of motion.  Specialty Comments:   No specialty comments available.  Imaging: No results found.   PMFS History: Patient Active Problem List   Diagnosis Date Noted  . Post-operative state 08/20/2020  . Nontraumatic incomplete tear of left rotator cuff 08/20/2020  . AC (acromioclavicular) arthritis 08/20/2020  . Complete tear of left rotator cuff 07/09/2020  . Osteoarthritis of first carpometacarpal [facility]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 05/07/2020  . Osteoarthritis of left AC (acromioclavicular) joint 05/07/2020  . Impingement syndrome of left shoulder 02/08/2020  . S/P TKR (total knee replacement) using cement, left 10/03/2019  . Cellulitis of arm 08/28/2019  . Essential hypertension 02/16/2018  . Sepsis due to skin infection ([place]) 02/16/2018  . Chronic pain 02/16/2018  . Cellulitis of left lower extremity   . Lumbar radiculopathy, chronic 01/24/2018  . Lumbar stenosis with neurogenic claudication 03/23/2017  . Hypothyroidism 07/25/2015  . Primary osteoarthritis of right knee 07/23/2015  . Primary osteoarthritis of knee 07/23/2015   Past Medical History:  Diagnosis Date  . Arthritis    "knees; left shoulder" (04/12/2013)  . Asthma    "as a child" (04/12/2013)  . DDD (degenerative disc disease), cervical   . DDD (degenerative disc disease), lumbar   . Difficult intubation    2012   CERVICAL FUSION   .  Headache(784.0)    VERAPAMIL FOR PREVENTION   . 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 History of chicken pox    as an adult  . Hypoglycemia    last episode 1 yr. ago, just gets jittery  . Hypothyroidism   . Lumbar stenosis   . MRSA (methicillin resistant staph aureus) culture positive 09/10/2019   upper arm  . OSA (obstructive sleep apnea)    "haven't wore mask in years; had OR; still snore" (04/12/2013)  . Urinary frequency     History reviewed. No pertinent family history.  Past Surgical History:  Procedure Laterality Date   . BACK SURGERY    . CARPOMETACARPEL SUSPENSION PLASTY Left 07/19/2020   Procedure: LEFT THUMB CARPOMETACARPAL ([employer]) ARTHROPLASTY;  Surgeon: Leandrew Koyanagi, MD;  Location: [facility];  Service: Orthopedics;  Laterality: Left;  . CERVICAL FUSION  2002; 2012  . CYSTECTOMY    . INGUINAL HERNIA REPAIR Bilateral 1997  . KNEE ARTHROSCOPY Left ~ 1997; ~ 2005  . LUMBAR FUSION  2007; 04/11/2013  . PENILE DEBRIDEMENT  ~ 2011   X 3  FOR MRSA INFECTION    . SHOULDER ARTHROSCOPY Left ~ 2003  . TONSILLECTOMY  1990's  . TOTAL KNEE ARTHROPLASTY Right 07/23/2015  . TOTAL KNEE ARTHROPLASTY Right 07/23/2015   Procedure: TOTAL KNEE ARTHROPLASTY;  Surgeon: Garald Balding, MD;  Location: [facility];  Service: Orthopedics;  Laterality: Right;  . TOTAL KNEE ARTHROPLASTY Left 10/03/2019   Procedure: LEFT TOTAL KNEE ARTHROPLASTY;  Surgeon: Garald Balding, MD;  [facility];  Service: Orthopedics;  Laterality: Left;  . UVULOPALATOPHARYNGOPLASTY (UPPP)/TONSILLECTOMY/SEPTOPLASTY  1990's  . VASECTOMY     Social History   Occupational History  . Not on file  Tobacco Use  . Smoking status: Former Smoker    Packs/day: 0.50    Years: 15.00    Pack years: 7.50    Types: Cigarettes    Quit date: 10/14/1986    Years since quitting: 33.9  . Smokeless tobacco: Never Used  Vaping Use  . Vaping Use: Never used  Substance and Sexual Activity  . Alcohol use: Yes    Comment: occasional  . Drug use: No  . Sexual activity: Yes

## 2020-09-04 NOTE — Therapy (Signed)
[facility] [address] [place], [place], [address] Phone: [phone]   Fax:  [phone]  Occupational Therapy Treatment  Patient Details  Name: Larry [name] MRN: [id number] Date of Birth: Nov 20, 1950 Referring Provider (OT): Dr. Erlinda Hong   Encounter Date: 09/04/2020   OT End of Session - 09/04/20 0916    Visit Number 2    Number of Visits 17    Date for OT Re-Evaluation 10/25/20    Authorization Type Medicare    Authorization Time Period cert for 90 days    Authorization - Visit Number 2    Authorization - Number of Visits 10    OT Start Time 0755    OT Stop Time 0904    OT Time Calculation (min) 69 min    Activity Tolerance Patient tolerated treatment well    Behavior During Therapy [facility] for tasks assessed/performed           Past Medical History:  Diagnosis Date  . Arthritis    "knees; left shoulder" (04/12/2013)  . Asthma    "as a child" (04/12/2013)  . DDD (degenerative disc disease), cervical   . DDD (degenerative disc disease), lumbar   . Difficult intubation    2012   CERVICAL FUSION   . Headache(784.0)    VERAPAMIL FOR PREVENTION   . 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 History of chicken pox    as an adult  . Hypoglycemia    last episode 1 yr. ago, just gets jittery  . Hypothyroidism   . Lumbar stenosis   . MRSA (methicillin resistant staph aureus) culture positive 09/10/2019   upper arm  . OSA (obstructive sleep apnea)    "haven't wore mask in years; had OR; still snore" (04/12/2013)  . Urinary frequency     Past Surgical History:  Procedure Laterality Date  . BACK SURGERY    . CARPOMETACARPEL SUSPENSION PLASTY Left 07/19/2020   Procedure: LEFT THUMB CARPOMETACARPAL ([place]) ARTHROPLASTY;  Surgeon: Leandrew Koyanagi, MD;  Location: [place];  Service: Orthopedics;  Laterality: Left;   . CERVICAL FUSION  2002; 2012  . CYSTECTOMY    . INGUINAL HERNIA REPAIR Bilateral 1997  . KNEE ARTHROSCOPY Left ~ 1997; ~ 2005  . LUMBAR FUSION  2007; 04/11/2013  . PENILE DEBRIDEMENT  ~ 2011   X 3  FOR MRSA INFECTION    . SHOULDER ARTHROSCOPY Left ~ 2003  . TONSILLECTOMY  1990's  . TOTAL KNEE ARTHROPLASTY Right 07/23/2015  . TOTAL KNEE ARTHROPLASTY Right 07/23/2015   Procedure: TOTAL KNEE ARTHROPLASTY;  Surgeon: Garald Balding, MD;  Location: [place];  Service: Orthopedics;  Laterality: Right;  . TOTAL KNEE ARTHROPLASTY Left 10/03/2019   Procedure: LEFT TOTAL KNEE ARTHROPLASTY;  Surgeon: Garald Balding, MD;  [facility];  Service: Orthopedics;  Laterality: Left;  . UVULOPALATOPHARYNGOPLASTY (UPPP)/TONSILLECTOMY/SEPTOPLASTY  1990's  . VASECTOMY      There were no vitals filed for this visit.   Subjective Assessment - 09/04/20 0801    Subjective  Pt reports having some hypersensitivity at base of splint along dorsal thumb left hand.    Pertinent History s/p Left thumb carpometacarpal interposition arthroplasty, thumb  Left carpometacarpal reconstruction with tendon graft 07/19/20.      Pt is hypothyroidism, OA, asthma, back pain s/p lumbar fusion and cervical fusion, recent rotator cuff repair by Dr.  Whitfield - PT is addressing.    Patient Stated Goals regain use of hand    Currently in Pain? No/denies    Pain Score 0-No pain    Aggravating Factors  Movement    Pain Relieving Factors Rest, pain medication.    Multiple Pain Sites No               OT Treatments/Exercises (OP) - 09/04/20 0001      ADLs   ADL Comments Discussed and reviewed splinting use, care and precautions. Pt will begin to wean from splint when at rest and relaxing at home for 1 hour intervals. He will continue to wear during light ADL's and will wear pre-fabricated splint as issued by MD office at night as per protocol. He verbalized understanding of this in the clinic today.      Exercises    Exercises Hand;Wrist   left     Wrist Exercises   Wrist Flexion AROM;15 reps    Other wrist exercises Active wrist flexion/extension      Hand Exercises   Other Hand Exercises Active opposition left thumb, active MCP/IP flexion, composite flexion left thumb, opposition left thumb.    Other Hand Exercises --      Modalities   Modalities Fluidotherapy      LUE Fluidotherapy   Number Minutes Fluidotherapy 10 Minutes    LUE Fluidotherapy Location Hand;Wrist    Comments Pt performed HEP and gentle active exercises while in fluidotherapy today to assist with ncreased flexibility and active ROM left thumb. No adverse reactions noted.      Splinting   Splinting Pt reports some rubbing along first dorsal compartment left thumb, Splint appears well fitting however elastic stockinette may be causing compression within the splint. He was issued stockinette w/o elastic/compresion and splint was heated and remolded in an attempt to take some pressure off of first dorsal compartment. Splint was flared in this area and padding was replaced for comfort. Splinting use, care and precautions were reviewed at this time.       Manual Therapy   Manual Therapy Other (comment)   Scar management/massage and edema control left    Other Manual Therapy Pt educated in and performed scar massage to assist with increasing flexibility along left thumb 1st dorsal compartment, dorsal hand and volar forearm. Also performed to small volar forearm scar. Edema control techniques performed and discussed. Pt to add these to home program. x10 min                  OT Education - 09/04/20 0914    Education Details Splint use, care and precautions, updated AROM HEP- reviewed to perform slowly and gently and not to strain, scar management and edema control implemented left thumb/hand.    Person(s) Educated Patient    Methods Explanation;Demonstration;Verbal cues;Handout    Comprehension Verbalized understanding;Returned  demonstration;Verbal cues required            OT Short Term Goals - 08/30/20 1114      OT SHORT TERM GOAL #1   Title I with inital HEP.    Time 4    Period Weeks    Status New      OT SHORT TERM GOAL #2   Title I with splint wear, care and precautions    Time 4    Period Weeks    Status New      OT SHORT TERM GOAL #3   Title Pt will increase L wrist flexion to  40* for increased functional use.    Time 4    Period Weeks    Status New      OT SHORT TERM GOAL #4   Title Pt will demonstrate ability to oppose all digits in prep for functional use of LUE.    Time 4    Period Weeks    Status New      OT SHORT TERM GOAL #5   Title Pt will demonstrate full composite finger flexion in prep for ADLs.    Baseline 95%    Time 8    Period Weeks    Status New      OT SHORT TERM GOAL #6   Title I with edema control and scar massage    Time 4    Period Weeks    Status New             OT Long Term Goals - 08/30/20 1116      OT LONG TERM GOAL #1   Title I with updated HEP.    Time 8    Period Weeks    Status New      OT LONG TERM GOAL #2   Title Pt will demonstrate grip strength of at least 30 lbs for ADL performance.    Time 8    Period Weeks    Status New      OT LONG TERM GOAL #3   Title Pt will resume use of LUE as a non dominant assist at least 90% of the time with pain less than or equal to 3/10.    Time 8    Period Weeks    Status New      OT LONG TERM GOAL #4   Title Pt will demonstrate adequate 3 pt pinch strength in order  to place and remove an 8 lbs clip from antennae.    Baseline not tested due to precautions    Time 8    Period Weeks    Status New                 Plan - 09/04/20 0174    Clinical Impression Statement Splint adjustments and progression of HEP as per protocol as well as edema control and scar management should assist with increased comfort and flexibility/active ROM left thumb. Pt is now 6 weeks and 5 days post-op. He  will begin to wean from hand based splint for 1 hour sessions during the day but will continue to wear during all light ADL's and sleep in pre-fab splint at night left thumb. Continue toward POC and STG's next visit as per protocol.    OT Occupational Profile and History Detailed Assessment- Review of Records and additional review of physical, cognitive, psychosocial history related to current functional performance    Occupational performance deficits (Please refer to evaluation for details): ADL's;IADL's;Rest and Sleep;Work;Play;Leisure;Social Participation    Body Structure / Function / Physical Skills ADL;Dexterity;GMC;Pain;Strength;UE functional use;ROM;IADL;Endurance;Coordination;Flexibility;Mobility;Decreased knowledge of precautions;FMC;Sensation;Scar mobility    Rehab Potential Good    Clinical Decision Making Limited treatment options, no task modification necessary    Comorbidities Affecting Occupational Performance: May have comorbidities impacting occupational performance    Modification or Assistance to Complete Evaluation  No modification of tasks or assist necessary to complete eval    OT Duration 8 weeks    OT Treatment/Interventions Self-care/ADL training;Moist Heat;Fluidtherapy;DME and/or AE instruction;Splinting;Therapeutic activities;Contrast Bath;Ultrasound;Therapeutic exercise;Scar mobilization;Passive range of motion;Neuromuscular education;Cryotherapy;Electrical Stimulation;Paraffin;Energy conservation;Manual Therapy;Patient/family education    Plan Splint check PRN,  Review HEP and progress as per protocol following CMC arthroplasty left non-dominant thumb. ?Fluidotherapy for increased flexibility.    Consulted and Agree with Plan of Care Patient           Patient will benefit from skilled therapeutic intervention in order to improve the following deficits and impairments:   Body Structure / Function / Physical Skills: ADL, Dexterity, GMC, Pain, Strength, UE functional use,  ROM, IADL, Endurance, Coordination, Flexibility, Mobility, Decreased knowledge of precautions, FMC, Sensation, Scar mobility       Visit Diagnosis: Pain in left hand  Stiffness of left hand, not elsewhere classified  Muscle weakness (generalized)  Localized edema    Problem List Patient Active Problem List   Diagnosis Date Noted  . Post-operative state 08/20/2020  . Nontraumatic incomplete tear of left rotator cuff 08/20/2020  . AC (acromioclavicular) arthritis 08/20/2020  . Complete tear of left rotator cuff 07/09/2020  . 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 Osteoarthritis of left AC (acromioclavicular) joint 05/07/2020  . Impingement syndrome of left shoulder 02/08/2020  . S/P TKR (total knee replacement) using cement, left 10/03/2019  . Cellulitis of arm 08/28/2019  . Essential hypertension 02/16/2018  . Sepsis due to skin infection ([place]) 02/16/2018  . Chronic pain 02/16/2018  . Cellulitis of left lower extremity   . Lumbar radiculopathy, chronic 01/24/2018  . Lumbar stenosis with neurogenic claudication 03/23/2017  . Hypothyroidism 07/25/2015  . Primary osteoarthritis of right knee 07/23/2015  . Primary osteoarthritis of knee 07/23/2015   Do these exercises in addition to the exercises that you were given previously.  IP Flexion (Active Blocked)    Brace thumb below tip joint. Bend joint as far as possible. Repeat __10__ times. Do _3-4___ sessions per day.    AROM: Thumb Flexion / Extension    Actively bend right thumb across palm as far as possible. Hold _3-5___ seconds. Relax. Then pull thumb back into hitchhike position. Repeat _10__ times per set. Do __3-4__ sessions per day.  MP Flexion (Active Blocked)    Using other hand to brace base of thumb, bend as far as possible with tip joint held straight. Repeat _10___ times. Do _3-4__ sessions per day.  Opposition (Active)    Touch tip of thumb to nail tip  of each finger in turn, making an "O" shape. Repeat _10_ times. Do _3-4__ sessions per day.    Massage the scars on your thumb/hand and forearm as described and performed in clinic. You can also do the massage that helps with swelling (start at the tip of the thumb and massage down past your wrist).    Wear the short/custom splint during day for activities of daily living (cooking, cleaning, bathing, dressing, eating etc) but you may now take it off when relaxing during the day. Wear the other splint at night when you sleep.   Percell Miller Beth Dixon, OTR/L 09/04/2020, 9:23 AM  [facility] [address] [place], [place], [address] Phone: [phone]   Fax:  [phone]  Name: Larry [name] MRN: [id number] Date of Birth: 1950-10-01

## 2020-09-05 ENCOUNTER — Ambulatory Visit: Payer: Medicare Other | Admitting: *Deleted

## 2020-09-10 ENCOUNTER — Encounter: Payer: Self-pay | Admitting: *Deleted

## 2020-09-10 ENCOUNTER — Other Ambulatory Visit: Payer: Self-pay

## 2020-09-10 ENCOUNTER — Ambulatory Visit: Payer: Medicare Other | Admitting: *Deleted

## 2020-09-10 DIAGNOSIS — M79642 Pain in left hand: Secondary | ICD-10-CM | POA: Diagnosis not present

## 2020-09-10 DIAGNOSIS — R600 Localized edema: Secondary | ICD-10-CM

## 2020-09-10 DIAGNOSIS — M25642 Stiffness of left hand, not elsewhere classified: Secondary | ICD-10-CM | POA: Diagnosis not present

## 2020-09-10 DIAGNOSIS — M6281 Muscle weakness (generalized): Secondary | ICD-10-CM

## 2020-09-10 NOTE — Therapy (Signed)
[facility] [address] [place], [place], [address] Phone: [phone]   Fax:  [phone]  Occupational Therapy Treatment  Patient Details  Name: Larry [name] MRN: [id number] Date of Birth: 1951/01/16 Referring Provider (OT): Dr. Erlinda Hong   Encounter Date: 09/10/2020   OT End of Session - 09/10/20 0845    Visit Number 3    Number of Visits 17    Date for OT Re-Evaluation 10/25/20    Authorization Type Medicare    Authorization Time Period cert for 90 days    Authorization - Visit Number 3    Authorization - Number of Visits 10    OT Start Time 0751    OT Stop Time 0837    OT Time Calculation (min) 46 min    Activity Tolerance Patient tolerated treatment well    Behavior During Therapy [facility] for tasks assessed/performed           Past Medical History:  Diagnosis Date  . Arthritis    "knees; left shoulder" (04/12/2013)  . Asthma    "as a child" (04/12/2013)  . DDD (degenerative disc disease), cervical   . DDD (degenerative disc disease), lumbar   . Difficult intubation    2012   CERVICAL FUSION   . Headache(784.0)    VERAPAMIL FOR PREVENTION   . 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 History of chicken pox    as an adult  . Hypoglycemia    last episode 1 yr. ago, just gets jittery  . Hypothyroidism   . Lumbar stenosis   . MRSA (methicillin resistant staph aureus) culture positive 09/10/2019   upper arm  . OSA (obstructive sleep apnea)    "haven't wore mask in years; had OR; still snore" (04/12/2013)  . Urinary frequency     Past Surgical History:  Procedure Laterality Date  . BACK SURGERY    . CARPOMETACARPEL SUSPENSION PLASTY Left 07/19/2020   Procedure: LEFT THUMB CARPOMETACARPAL ([place]) ARTHROPLASTY;  Surgeon: Leandrew Koyanagi, MD;  Location: [place];  Service: Orthopedics;  Laterality: Left;   . CERVICAL FUSION  2002; 2012  . CYSTECTOMY    . INGUINAL HERNIA REPAIR Bilateral 1997  . KNEE ARTHROSCOPY Left ~ 1997; ~ 2005  . LUMBAR FUSION  2007; 04/11/2013  . PENILE DEBRIDEMENT  ~ 2011   X 3  FOR MRSA INFECTION    . SHOULDER ARTHROSCOPY Left ~ 2003  . TONSILLECTOMY  1990's  . TOTAL KNEE ARTHROPLASTY Right 07/23/2015  . TOTAL KNEE ARTHROPLASTY Right 07/23/2015   Procedure: TOTAL KNEE ARTHROPLASTY;  Surgeon: Garald Balding, MD;  Location: [place];  Service: Orthopedics;  Laterality: Right;  . TOTAL KNEE ARTHROPLASTY Left 10/03/2019   Procedure: LEFT TOTAL KNEE ARTHROPLASTY;  Surgeon: Garald Balding, MD;  [facility];  Service: Orthopedics;  Laterality: Left;  . UVULOPALATOPHARYNGOPLASTY (UPPP)/TONSILLECTOMY/SEPTOPLASTY  1990's  . VASECTOMY      There were no vitals filed for this visit.   Subjective Assessment - 09/10/20 0805    Subjective  Pt reports having cont hypersensitivity at base of thumb/splint left hand near first dorsal compartment.    Pertinent History s/p Left thumb carpometacarpal interposition arthroplasty, thumb  Left carpometacarpal reconstruction with tendon graft 07/19/20.      Pt is hypothyroidism, OA, asthma, back pain s/p lumbar fusion and cervical fusion, recent rotator cuff repair by  Dr. Durward Fortes - PT is addressing.    Patient Stated Goals regain use of hand    Currently in Pain? No/denies    Pain Score 0-No pain    Multiple Pain Sites Yes              [facility] OT Assessment - 09/10/20 0001      ROM / Strength   AROM / PROM / Strength AROM      Left Hand AROM   L Thumb MCP 0-60 30 Degrees    L Thumb IP 0-80 60 Degrees    L Thumb Radial ADduction/ABduction 0-55 20-55    L Thumb Opposition to Index --   Able to oppose to tip of RF, unable to oppose to small.                   OT Treatments/Exercises (OP) - 09/10/20 0001      ADLs   ADL Comments Discussed and reviewed splinting use, care and precautions. Pt will begin to wean  from splint when at rest and relaxing at home for up to 1 hour intervals. He will continue to wear during heavier ADL's and will wear pre-fabricated splint as issued by MD office at night as per protocol. He verbalized understanding of this in the clinic today.      Exercises   Exercises Hand;Wrist      Wrist Exercises   Wrist Flexion AROM;15 reps    Other wrist exercises Active wrist flexion/extension      Hand Exercises   Other Hand Exercises Active opposition left thumb, active MCP/IP flexion, composite flexion left thumb, opposition left thumb.      Modalities   Modalities Fluidotherapy      LUE Fluidotherapy   Number Minutes Fluidotherapy 10 Minutes    LUE Fluidotherapy Location Hand;Wrist    Comments Pt performed HEP and gentle active exercises while in fluidotherapy today to assist with ncreased flexibility and active ROM left thumb. No adverse reactions noted.      Splinting   Splinting --   Pt reports some rubbing along first dorsal compartment. Heated and flared and changed stockinette as pt seems to be sensitive to stockinette with elastic causing increased compression on first dorsal compartment.     Manual Therapy   Manual Therapy Other (comment)   Scar management/edema control left thumb   Other Manual Therapy Performed scar massage and desensitization as well as edema control/retrograde massage left thumb x8 min                  OT Education - 09/10/20 0844    Education Details Reviewed Splint use, care and precautions, AROM HEP- reviewed to perform slowly and gently and not to strain, scar management and edema control left thumb/hand, begin weaning from hand based splint for up to 3 hours a day during light ADL's (examples provided).    Person(s) Educated Patient    Methods Explanation;Demonstration;Verbal cues;Handout    Comprehension Verbalized understanding;Returned demonstration;Verbal cues required            OT Short Term Goals - 08/30/20 1114       OT SHORT TERM GOAL #1   Title I with inital HEP.    Time 4    Period Weeks    Status New      OT SHORT TERM GOAL #2   Title I with splint wear, care and precautions    Time 4    Period Weeks    Status  New      OT SHORT TERM GOAL #3   Title Pt will increase L wrist flexion to 40* for increased functional use.    Time 4    Period Weeks    Status New      OT SHORT TERM GOAL #4   Title Pt will demonstrate ability to oppose all digits in prep for functional use of LUE.    Time 4    Period Weeks    Status New      OT SHORT TERM GOAL #5   Title Pt will demonstrate full composite finger flexion in prep for ADLs.    Baseline 95%    Time 8    Period Weeks    Status New      OT SHORT TERM GOAL #6   Title I with edema control and scar massage    Time 4    Period Weeks    Status New             OT Long Term Goals - 08/30/20 1116      OT LONG TERM GOAL #1   Title I with updated HEP.    Time 8    Period Weeks    Status New      OT LONG TERM GOAL #2   Title Pt will demonstrate grip strength of at least 30 lbs for ADL performance.    Time 8    Period Weeks    Status New      OT LONG TERM GOAL #3   Title Pt will resume use of LUE as a non dominant assist at least 90% of the time with pain less than or equal to 3/10.    Time 8    Period Weeks    Status New      OT LONG TERM GOAL #4   Title Pt will demonstrate adequate 3 pt pinch strength in order  to place and remove an 8 lbs clip from antennae.    Baseline not tested due to precautions    Time 8    Period Weeks    Status New                 Plan - 09/10/20 0846    Clinical Impression Statement Splint adjustments and weaning from splint during light ADL's during the day, HEP as per protocol and edema control/scar management should assist with increased comfort and flexibility/active ROM left thumb. Pt is now 7 weeks and 4 days post-op. Continue toward POC and STG's next visit as per protocol.    OT  Occupational Profile and History Detailed Assessment- Review of Records and additional review of physical, cognitive, psychosocial history related to current functional performance    Occupational performance deficits (Please refer to evaluation for details): ADL's;IADL's;Rest and Sleep;Work;Play;Leisure;Social Participation    Body Structure / Function / Physical Skills ADL;Dexterity;GMC;Pain;Strength;UE functional use;ROM;IADL;Endurance;Coordination;Flexibility;Mobility;Decreased knowledge of precautions;FMC;Sensation;Scar mobility    Rehab Potential Good    Clinical Decision Making Limited treatment options, no task modification necessary    Comorbidities Affecting Occupational Performance: May have comorbidities impacting occupational performance    Modification or Assistance to Complete Evaluation  No modification of tasks or assist necessary to complete eval    OT Frequency 2x / week    OT Duration 8 weeks    OT Treatment/Interventions Self-care/ADL training;Moist Heat;Fluidtherapy;DME and/or AE instruction;Splinting;Therapeutic activities;Contrast Bath;Ultrasound;Therapeutic exercise;Scar mobilization;Passive range of motion;Neuromuscular education;Cryotherapy;Electrical Stimulation;Paraffin;Energy conservation;Manual Therapy;Patient/family education    Plan Fluidotherapy left hand/thumb, light functional  activity to encourage active ROM and opposition. As per protocol following thumb interposition arthroplasty and carpometacarpal reconstruction w/ tendon graft.    Consulted and Agree with Plan of Care Patient           Patient will benefit from skilled therapeutic intervention in order to improve the following deficits and impairments:   Body Structure / Function / Physical Skills: ADL,Dexterity,GMC,Pain,Strength,UE functional use,ROM,IADL,Endurance,Coordination,Flexibility,Mobility,Decreased knowledge of precautions,FMC,Sensation,Scar mobility       Visit Diagnosis: Pain in left  hand  Stiffness of left hand, not elsewhere classified  Muscle weakness (generalized)  Localized edema    Problem List Patient Active Problem List   Diagnosis Date Noted  . Post-operative state 08/20/2020  . Nontraumatic incomplete tear of left rotator cuff 08/20/2020  . AC (acromioclavicular) arthritis 08/20/2020  . Complete tear of left rotator cuff 07/09/2020  . 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 Osteoarthritis of left AC (acromioclavicular) joint 05/07/2020  . Impingement syndrome of left shoulder 02/08/2020  . S/P TKR (total knee replacement) using cement, left 10/03/2019  . Cellulitis of arm 08/28/2019  . Essential hypertension 02/16/2018  . Sepsis due to skin infection ([place]) 02/16/2018  . Chronic pain 02/16/2018  . Cellulitis of left lower extremity   . Lumbar radiculopathy, chronic 01/24/2018  . Lumbar stenosis with neurogenic claudication 03/23/2017  . Hypothyroidism 07/25/2015  . Primary osteoarthritis of right knee 07/23/2015  . Primary osteoarthritis of knee 07/23/2015    Larry [name], OTR/L 09/10/2020, 8:51 AM  [facility] [address] [place], [place], [address] Phone: [phone]   Fax:  [phone]  Name: Larry [name] MRN: [id number] Date of Birth: 12-02-1950

## 2020-09-11 DIAGNOSIS — M542 Cervicalgia: Secondary | ICD-10-CM | POA: Diagnosis not present

## 2020-09-11 DIAGNOSIS — M545 Low back pain, unspecified: Secondary | ICD-10-CM | POA: Diagnosis not present

## 2020-09-11 DIAGNOSIS — M79672 Pain in left foot: Secondary | ICD-10-CM | POA: Diagnosis not present

## 2020-09-11 DIAGNOSIS — M79671 Pain in right foot: Secondary | ICD-10-CM | POA: Diagnosis not present

## 2020-09-11 DIAGNOSIS — G603 Idiopathic progressive neuropathy: Secondary | ICD-10-CM | POA: Diagnosis not present

## 2020-09-12 ENCOUNTER — Other Ambulatory Visit: Payer: Self-pay

## 2020-09-12 ENCOUNTER — Ambulatory Visit: Payer: Medicare Other | Admitting: Occupational Therapy

## 2020-09-12 DIAGNOSIS — M6281 Muscle weakness (generalized): Secondary | ICD-10-CM | POA: Diagnosis not present

## 2020-09-12 DIAGNOSIS — R600 Localized edema: Secondary | ICD-10-CM | POA: Diagnosis not present

## 2020-09-12 DIAGNOSIS — M25642 Stiffness of left hand, not elsewhere classified: Secondary | ICD-10-CM

## 2020-09-12 DIAGNOSIS — M79642 Pain in left hand: Secondary | ICD-10-CM | POA: Diagnosis not present

## 2020-09-12 NOTE — Therapy (Signed)
[place] [address] [place], [place], [address] Phone: [phone]   Fax:  [phone]  Occupational Therapy Treatment  Patient Details  Name: Larry [name] MRN: [id number] Date of Birth: 1951/02/16 Referring Provider (OT): Dr. Erlinda Hong   Encounter Date: 09/12/2020   OT End of Session - 09/12/20 1414    Visit Number 4    Number of Visits 17    Date for OT Re-Evaluation 10/25/20    Authorization Type Medicare    Authorization Time Period cert for 90 days    Authorization - Visit Number 4    Authorization - Number of Visits 10    OT Start Time 1315    OT Stop Time 1400    OT Time Calculation (min) 45 min    Activity Tolerance Patient tolerated treatment well    Behavior During Therapy [facility] for tasks assessed/performed           Past Medical History:  Diagnosis Date  . Arthritis    "knees; left shoulder" (04/12/2013)  . Asthma    "as a child" (04/12/2013)  . DDD (degenerative disc disease), cervical   . DDD (degenerative disc disease), lumbar   . Difficult intubation    2012   CERVICAL FUSION   . Headache(784.0)    VERAPAMIL FOR PREVENTION   . 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 History of chicken pox    as an adult  . Hypoglycemia    last episode 1 yr. ago, just gets jittery  . Hypothyroidism   . Lumbar stenosis   . MRSA (methicillin resistant staph aureus) culture positive 09/10/2019   upper arm  . OSA (obstructive sleep apnea)    "haven't wore mask in years; had OR; still snore" (04/12/2013)  . Urinary frequency     Past Surgical History:  Procedure Laterality Date  . BACK SURGERY    . CARPOMETACARPEL SUSPENSION PLASTY Left 07/19/2020   Procedure: LEFT THUMB CARPOMETACARPAL ([place]) ARTHROPLASTY;  Surgeon: Leandrew Koyanagi, MD;  Location: [place];  Service: Orthopedics;  Laterality: Left;   . CERVICAL FUSION  2002; 2012  . CYSTECTOMY    . INGUINAL HERNIA REPAIR Bilateral 1997  . KNEE ARTHROSCOPY Left ~ 1997; ~ 2005  . LUMBAR FUSION  2007; 04/11/2013  . PENILE DEBRIDEMENT  ~ 2011   X 3  FOR MRSA INFECTION    . SHOULDER ARTHROSCOPY Left ~ 2003  . TONSILLECTOMY  1990's  . TOTAL KNEE ARTHROPLASTY Right 07/23/2015  . TOTAL KNEE ARTHROPLASTY Right 07/23/2015   Procedure: TOTAL KNEE ARTHROPLASTY;  Surgeon: Garald Balding, MD;  Location: [place];  Service: Orthopedics;  Laterality: Right;  . TOTAL KNEE ARTHROPLASTY Left 10/03/2019   Procedure: LEFT TOTAL KNEE ARTHROPLASTY;  Surgeon: Garald Balding, MD;  [facility];  Service: Orthopedics;  Laterality: Left;  . UVULOPALATOPHARYNGOPLASTY (UPPP)/TONSILLECTOMY/SEPTOPLASTY  1990's  . VASECTOMY      There were no vitals filed for this visit.   Subjective Assessment - 09/12/20 1323    Subjective  Pt reports having cont hypersensitivity at base of thumb/splint left hand near first dorsal compartment.    Pertinent History s/p Left thumb carpometacarpal interposition arthroplasty, thumb  Left carpometacarpal reconstruction with tendon graft 07/19/20.      Pt is hypothyroidism, OA, asthma, back pain s/p lumbar fusion and cervical fusion, recent rotator cuff repair by  Dr. Durward Fortes - PT is addressing.    Patient Stated Goals regain use of hand    Currently in Pain? No/denies          Fluidotherapy unavailable today.  Issued neoprene CMC brace as pt can now wear for most functional light activities during the day to support [facility] joint but allow slightly more movement for function. Pt instructed to gradually wean from fabricated daytime splint and move towards neoprene brace. Pt instructed to continue wearing thumb/wrist brace at night as per protocol.  Pt wearing neoprene splint to place pegs in pegboard and remove for light resistance. Pt also placed graded clothespins on antenna and removing yellow to red resistance only, and  carrying cup 1/2 full of water with brace on. Pt instructed he can do gentle P/ROM for thumb opposition with volar support to [facility] and MP joint of thumb.                        OT Short Term Goals - 08/30/20 1114      OT SHORT TERM GOAL #1   Title I with inital HEP.    Time 4    Period Weeks    Status New      OT SHORT TERM GOAL #2   Title I with splint wear, care and precautions    Time 4    Period Weeks    Status New      OT SHORT TERM GOAL #3   Title Pt will increase L wrist flexion to 40* for increased functional use.    Time 4    Period Weeks    Status New      OT SHORT TERM GOAL #4   Title Pt will demonstrate ability to oppose all digits in prep for functional use of LUE.    Time 4    Period Weeks    Status New      OT SHORT TERM GOAL #5   Title Pt will demonstrate full composite finger flexion in prep for ADLs.    Baseline 95%    Time 8    Period Weeks    Status New      OT SHORT TERM GOAL #6   Title I with edema control and scar massage    Time 4    Period Weeks    Status New             OT Long Term Goals - 08/30/20 1116      OT LONG TERM GOAL #1   Title I with updated HEP.    Time 8    Period Weeks    Status New      OT LONG TERM GOAL #2   Title Pt will demonstrate grip strength of at least 30 lbs for ADL performance.    Time 8    Period Weeks    Status New      OT LONG TERM GOAL #3   Title Pt will resume use of LUE as a non dominant assist at least 90% of the time with pain less than or equal to 3/10.    Time 8    Period Weeks    Status New      OT LONG TERM GOAL #4   Title Pt will demonstrate adequate 3 pt pinch strength in order  to place and remove an 8 lbs clip from antennae.    Baseline not tested due to precautions  Time 8    Period Weeks    Status New                 Plan - 09/12/20 1415    Clinical Impression Statement Pt is now only 1 day shy of 8 weeks post-op. Pt instructed to gradually wean  from hard splint and wear neoprene splint (issued today) more during the day. Encouraged pt to still wear wrist/thumb brace at night per protocol.    OT Occupational Profile and History Detailed Assessment- Review of Records and additional review of physical, cognitive, psychosocial history related to current functional performance    Occupational performance deficits (Please refer to evaluation for details): ADL's;IADL's;Rest and Sleep;Work;Play;Leisure;Social Participation    Body Structure / Function / Physical Skills ADL;Dexterity;GMC;Pain;Strength;UE functional use;ROM;IADL;Endurance;Coordination;Flexibility;Mobility;Decreased knowledge of precautions;FMC;Sensation;Scar mobility    Rehab Potential Good    Clinical Decision Making Limited treatment options, no task modification necessary    Comorbidities Affecting Occupational Performance: May have comorbidities impacting occupational performance    Modification or Assistance to Complete Evaluation  No modification of tasks or assist necessary to complete eval    OT Frequency 2x / week    OT Duration 8 weeks    OT Treatment/Interventions Self-care/ADL training;Moist Heat;Fluidtherapy;DME and/or AE instruction;Splinting;Therapeutic activities;Contrast Bath;Ultrasound;Therapeutic exercise;Scar mobilization;Passive range of motion;Neuromuscular education;Cryotherapy;Electrical Stimulation;Paraffin;Energy conservation;Manual Therapy;Patient/family education    Plan fluidotherapy, begin light resistive tasks including possibly putty, and use of rubber bands for thumb opposition and palmer abduction    Consulted and Agree with Plan of Care Patient           Patient will benefit from skilled therapeutic intervention in order to improve the following deficits and impairments:   Body Structure / Function / Physical Skills: ADL,Dexterity,GMC,Pain,Strength,UE functional use,ROM,IADL,Endurance,Coordination,Flexibility,Mobility,Decreased knowledge of  precautions,FMC,Sensation,Scar mobility       Visit Diagnosis: Stiffness of left hand, not elsewhere classified  Muscle weakness (generalized)    Problem List Patient Active Problem List   Diagnosis Date Noted  . Post-operative state 08/20/2020  . Nontraumatic incomplete tear of left rotator cuff 08/20/2020  . AC (acromioclavicular) arthritis 08/20/2020  . Complete tear of left rotator cuff 07/09/2020  . 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 Osteoarthritis of left AC (acromioclavicular) joint 05/07/2020  . Impingement syndrome of left shoulder 02/08/2020  . S/P TKR (total knee replacement) using cement, left 10/03/2019  . Cellulitis of arm 08/28/2019  . Essential hypertension 02/16/2018  . Sepsis due to skin infection ([place]) 02/16/2018  . Chronic pain 02/16/2018  . Cellulitis of left lower extremity   . Lumbar radiculopathy, chronic 01/24/2018  . Lumbar stenosis with neurogenic claudication 03/23/2017  . Hypothyroidism 07/25/2015  . Primary osteoarthritis of right knee 07/23/2015  . Primary osteoarthritis of knee 07/23/2015    Carey Bullocks, OTR/L 09/12/2020, 2:18 PM  [place] [address] [place], [place], [address] Phone: [phone]   Fax:  [phone]  Name: Larry [name] MRN: [id number] Date of Birth: Jan 02, 1951

## 2020-09-17 ENCOUNTER — Ambulatory Visit: Payer: Medicare Other | Admitting: Occupational Therapy

## 2020-09-17 ENCOUNTER — Other Ambulatory Visit: Payer: Self-pay

## 2020-09-17 ENCOUNTER — Encounter: Payer: Self-pay | Admitting: Occupational Therapy

## 2020-09-17 DIAGNOSIS — M25642 Stiffness of left hand, not elsewhere classified: Secondary | ICD-10-CM | POA: Diagnosis not present

## 2020-09-17 DIAGNOSIS — M6281 Muscle weakness (generalized): Secondary | ICD-10-CM | POA: Diagnosis not present

## 2020-09-17 DIAGNOSIS — M79642 Pain in left hand: Secondary | ICD-10-CM | POA: Diagnosis not present

## 2020-09-17 DIAGNOSIS — R600 Localized edema: Secondary | ICD-10-CM

## 2020-09-17 NOTE — Patient Instructions (Addendum)
1. Grip Strengthening (Resistive Putty)   Squeeze putty using thumb and all fingers. Repeat _20___ times. Do _1__ sessions per day.   2. Roll putty into tube on table and pinch between each finger and thumb x 10-20 reps each. (can do ring and small finger together) Then repeat, performing lateral pinch(key pinch)10-20 reps  STOP if pain increases significantly     Copyright  VHI. All rights reserved.

## 2020-09-17 NOTE — Therapy (Signed)
[place] [address] [place], [place], [address] Phone: [phone]   Fax:  [phone]  Occupational Therapy Treatment  Patient Details  Name: Larry [name] MRN: [id number] Date of Birth: 07-11-1951 Referring Provider (OT): Dr. Erlinda Hong   Encounter Date: 09/17/2020   OT End of Session - 09/17/20 1504    Visit Number 5    Number of Visits 17    Date for OT Re-Evaluation 10/25/20    Authorization Type Medicare    Authorization Time Period cert for 90 days    Authorization - Visit Number 5    Authorization - Number of Visits 10    OT Start Time 0803    OT Stop Time 0845    OT Time Calculation (min) 42 min    Activity Tolerance Patient tolerated treatment well    Behavior During Therapy [facility] for tasks assessed/performed           Past Medical History:  Diagnosis Date  . Arthritis    "knees; left shoulder" (04/12/2013)  . Asthma    "as a child" (04/12/2013)  . DDD (degenerative disc disease), cervical   . DDD (degenerative disc disease), lumbar   . Difficult intubation    2012   CERVICAL FUSION   . Headache(784.0)    VERAPAMIL FOR PREVENTION   . 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 History of chicken pox    as an adult  . Hypoglycemia    last episode 1 yr. ago, just gets jittery  . Hypothyroidism   . Lumbar stenosis   . MRSA (methicillin resistant staph aureus) culture positive 09/10/2019   upper arm  . OSA (obstructive sleep apnea)    "haven't wore mask in years; had OR; still snore" (04/12/2013)  . Urinary frequency     Past Surgical History:  Procedure Laterality Date  . BACK SURGERY    . CARPOMETACARPEL SUSPENSION PLASTY Left 07/19/2020   Procedure: LEFT THUMB CARPOMETACARPAL ([place]) ARTHROPLASTY;  Surgeon: Leandrew Koyanagi, MD;  Location: [place];  Service: Orthopedics;  Laterality: Left;   . CERVICAL FUSION  2002; 2012  . CYSTECTOMY    . INGUINAL HERNIA REPAIR Bilateral 1997  . KNEE ARTHROSCOPY Left ~ 1997; ~ 2005  . LUMBAR FUSION  2007; 04/11/2013  . PENILE DEBRIDEMENT  ~ 2011   X 3  FOR MRSA INFECTION    . SHOULDER ARTHROSCOPY Left ~ 2003  . TONSILLECTOMY  1990's  . TOTAL KNEE ARTHROPLASTY Right 07/23/2015  . TOTAL KNEE ARTHROPLASTY Right 07/23/2015   Procedure: TOTAL KNEE ARTHROPLASTY;  Surgeon: Garald Balding, MD;  Location: [place];  Service: Orthopedics;  Laterality: Right;  . TOTAL KNEE ARTHROPLASTY Left 10/03/2019   Procedure: LEFT TOTAL KNEE ARTHROPLASTY;  Surgeon: Garald Balding, MD;  [facility];  Service: Orthopedics;  Laterality: Left;  . UVULOPALATOPHARYNGOPLASTY (UPPP)/TONSILLECTOMY/SEPTOPLASTY  1990's  . VASECTOMY      There were no vitals filed for this visit.   Subjective Assessment - 09/17/20 0828    Subjective  Pt reports sleeping in custom short opponens splint    Pertinent History s/p Left thumb carpometacarpal interposition arthroplasty, thumb  Left carpometacarpal reconstruction with tendon graft 07/19/20.      Pt is hypothyroidism, OA, asthma, back pain s/p lumbar fusion and cervical fusion, recent rotator cuff repair by Dr. Durward Fortes - PT is addressing.  Patient Stated Goals regain use of hand                  Treatment: Fluidotherapy x 10 mins to LUE for pain and stiffness, no adverse reactions. A/ROM thumb flexion, IP flexion opposition, radial abduction/ adduction, followed by gentle self P/ROM MP flexion. Pt was instructed to wear neoprene splint during daytime, hard splint at night              OT Education - 09/17/20 1503    Education Details Reveiwed A/ROM and gentle P/ROM exercises, issued putty for pt to perfrom gentle strengthening    Person(s) Educated Patient    Methods Explanation;Demonstration;Verbal cues;Handout    Comprehension Verbalized understanding;Returned demonstration;Verbal cues required             OT Short Term Goals - 09/17/20 0842      OT SHORT TERM GOAL #1   Title I with inital HEP.    Time 4    Period Weeks    Status Achieved      OT SHORT TERM GOAL #2   Title I with splint wear, care and precautions    Time 4    Period Weeks    Status Achieved      OT SHORT TERM GOAL #3   Title Pt will increase L wrist flexion to 40* for increased functional use.    Time 4    Period Weeks    Status Achieved   55*     OT SHORT TERM GOAL #4   Title Pt will demonstrate ability to oppose all digits in prep for functional use of LUE.    Time 4    Period Weeks    Status On-going   difficulty opposing 5th digit     OT SHORT TERM GOAL #5   Title Pt will demonstrate full composite finger flexion in prep for ADLs.    Baseline 95%    Time 8    Period Weeks    Status On-going      OT SHORT TERM GOAL #6   Title I with edema control and scar massage    Time 4    Period Weeks    Status On-going   Pt has been educate in in scar massage            OT Long Term Goals - 08/30/20 1116      OT LONG TERM GOAL #1   Title I with updated HEP.    Time 8    Period Weeks    Status New      OT LONG TERM GOAL #2   Title Pt will demonstrate grip strength of at least 30 lbs for ADL performance.    Time 8    Period Weeks    Status New      OT LONG TERM GOAL #3   Title Pt will resume use of LUE as a non dominant assist at least 90% of the time with pain less than or equal to 3/10.    Time 8    Period Weeks    Status New      OT LONG TERM GOAL #4   Title Pt will demonstrate adequate 3 pt pinch strength in order  to place and remove an 8 lbs clip from antennae.    Baseline not tested due to precautions    Time 8    Period Weeks    Status New  Plan - 09/17/20 1505    Clinical Impression Statement Pt is 8.5 weeks post op. Red putty issued but pt was instructed to only use 1x day.    OT Occupational Profile and History Detailed Assessment- Review  of Records and additional review of physical, cognitive, psychosocial history related to current functional performance    Occupational performance deficits (Please refer to evaluation for details): ADL's;IADL's;Rest and Sleep;Work;Play;Leisure;Social Participation    Body Structure / Function / Physical Skills ADL;Dexterity;GMC;Pain;Strength;UE functional use;ROM;IADL;Endurance;Coordination;Flexibility;Mobility;Decreased knowledge of precautions;FMC;Sensation;Scar mobility    Rehab Potential Good    Clinical Decision Making Limited treatment options, no task modification necessary    Comorbidities Affecting Occupational Performance: May have comorbidities impacting occupational performance    Modification or Assistance to Complete Evaluation  No modification of tasks or assist necessary to complete eval    OT Frequency 2x / week    OT Duration 8 weeks    OT Treatment/Interventions Self-care/ADL training;Moist Heat;Fluidtherapy;DME and/or AE instruction;Splinting;Therapeutic activities;Contrast Bath;Ultrasound;Therapeutic exercise;Scar mobilization;Passive range of motion;Neuromuscular education;Cryotherapy;Electrical Stimulation;Paraffin;Energy conservation;Manual Therapy;Patient/family education    Plan fluidotherapy,continue light resistive tasks including possibly putty, and use of rubber bands for thumb opposition and palmer abduction    Consulted and Agree with Plan of Care Patient           Patient will benefit from skilled therapeutic intervention in order to improve the following deficits and impairments:   Body Structure / Function / Physical Skills: ADL,Dexterity,GMC,Pain,Strength,UE functional use,ROM,IADL,Endurance,Coordination,Flexibility,Mobility,Decreased knowledge of precautions,FMC,Sensation,Scar mobility       Visit Diagnosis: Stiffness of left hand, not elsewhere classified  Muscle weakness (generalized)  Pain in left hand  Localized edema    Problem  List Patient Active Problem List   Diagnosis Date Noted  . Post-operative state 08/20/2020  . Nontraumatic incomplete tear of left rotator cuff 08/20/2020  . AC (acromioclavicular) arthritis 08/20/2020  . Complete tear of left rotator cuff 07/09/2020  . Osteoarthritis of first carpometacarpal [facility]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 05/07/2020  . Osteoarthritis of left AC (acromioclavicular) joint 05/07/2020  . Impingement syndrome of left shoulder 02/08/2020  . S/P TKR (total knee replacement) using cement, left 10/03/2019  . Cellulitis of arm 08/28/2019  . Essential hypertension 02/16/2018  . Sepsis due to skin infection ([place]) 02/16/2018  . Chronic pain 02/16/2018  . Cellulitis of left lower extremity   . Lumbar radiculopathy, chronic 01/24/2018  . Lumbar stenosis with neurogenic claudication 03/23/2017  . Hypothyroidism 07/25/2015  . Primary osteoarthritis of right knee 07/23/2015  . Primary osteoarthritis of knee 07/23/2015    Minervia Osso 09/17/2020, 3:06 PM  [place] [address] [place], [place], [address] Phone: [phone]   Fax:  [phone]  Name: Larry [name] MRN: [id number] Date of Birth: 1951/01/05

## 2020-09-18 ENCOUNTER — Encounter: Payer: Self-pay | Admitting: Orthopaedic Surgery

## 2020-09-18 ENCOUNTER — Ambulatory Visit (INDEPENDENT_AMBULATORY_CARE_PROVIDER_SITE_OTHER): Payer: No Typology Code available for payment source | Admitting: Orthopaedic Surgery

## 2020-09-18 VITALS — Ht 66.0 in | Wt 184.0 lb

## 2020-09-18 DIAGNOSIS — S46012A Strain of muscle(s) and tendon(s) of the rotator cuff of left shoulder, initial encounter: Secondary | ICD-10-CM

## 2020-09-18 NOTE — Progress Notes (Signed)
Office Visit Note   Patient: Larry [name]           Date of Birth: 1951/05/07           MRN: [id number] Visit Date: 09/18/2020              Requested by: Merrilee Seashore, [place],  [place] [address] PCP: Merrilee Seashore, MD   Assessment & Plan: Visit Diagnoses:  1. Traumatic complete tear of left rotator cuff, initial encounter     Plan: 5-week status post rotator cuff tear repair with biceps tenodesis.  Being followed in physical therapy.  Doing well.  Does have some pain during the day and occasionally takes a tramadol.  May discontinue the sling next week and continue with therapy.  Plan to see him back in 2 weeks.  I do not think he will be able to return to work until least February  Follow-Up Instructions: Return in about 2 weeks (around 10/02/2020).   Orders:  No orders of the defined types were placed in this encounter.  No orders of the defined types were placed in this encounter.     Procedures: No procedures performed   Clinical Data: No additional findings.   Subjective: Chief Complaint  Patient presents with  . Left Shoulder - Follow-up    Left shoulder arthroscopy 08/15/2020  Patient presents today for follow up on his left shoulder. He is now 5 weeks out from left shoulder arthroscopy rotator cuff tear repair, with biceps tenodesis, DCR, and DCR. His surgery was on 08/15/2020. He states that he is going to therapy three times weekly. He has pain on and off. He wants to talk about his pain medicine. He is still in a sling.   HPI  Review of Systems   Objective: Vital Signs: Ht 5\' 6"  (1.676 m)   Wt 184 lb (83.5 kg)   BMI 29.70 kg/m   Physical Exam  Ortho Exam left shoulder incisions healing without a problem.  I could just about passively raise his arm over his head fully and even abduct at least 90 degrees comfortably.  Neurologically intact  Specialty Comments:  No specialty comments  available.  Imaging: No results found.   PMFS History: Patient Active Problem List   Diagnosis Date Noted  . Post-operative state 08/20/2020  . Nontraumatic incomplete tear of left rotator cuff 08/20/2020  . AC (acromioclavicular) arthritis 08/20/2020  . Complete tear of left rotator cuff 07/09/2020  . Osteoarthritis of first carpometacarpal [facility]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 05/07/2020  . Osteoarthritis of left AC (acromioclavicular) joint 05/07/2020  . Impingement syndrome of left shoulder 02/08/2020  . S/P TKR (total knee replacement) using cement, left 10/03/2019  . Cellulitis of arm 08/28/2019  . Essential hypertension 02/16/2018  . Sepsis due to skin infection ([place]) 02/16/2018  . Chronic pain 02/16/2018  . Cellulitis of left lower extremity   . Lumbar radiculopathy, chronic 01/24/2018  . Lumbar stenosis with neurogenic claudication 03/23/2017  . Hypothyroidism 07/25/2015  . Primary osteoarthritis of right knee 07/23/2015  . Primary osteoarthritis of knee 07/23/2015   Past Medical History:  Diagnosis Date  . Arthritis    "knees; left shoulder" (04/12/2013)  . Asthma    "as a child" (04/12/2013)  . DDD (degenerative disc disease), cervical   . DDD (degenerative disc disease), lumbar   . Difficult intubation    2012   CERVICAL FUSION   . Headache(784.0)    VERAPAMIL FOR PREVENTION   .  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 History of chicken pox    as an adult  . Hypoglycemia    last episode 1 yr. ago, just gets jittery  . Hypothyroidism   . Lumbar stenosis   . MRSA (methicillin resistant staph aureus) culture positive 09/10/2019   upper arm  . OSA (obstructive sleep apnea)    "haven't wore mask in years; had OR; still snore" (04/12/2013)  . Urinary frequency     History reviewed. No pertinent family history.  Past Surgical History:  Procedure Laterality Date  . BACK SURGERY    .  CARPOMETACARPEL SUSPENSION PLASTY Left 07/19/2020   Procedure: LEFT THUMB CARPOMETACARPAL ([facility]) ARTHROPLASTY;  Surgeon: Tarry Kos, MD;  Location: [facility];  Service: Orthopedics;  Laterality: Left;  . CERVICAL FUSION  2002; 2012  . CYSTECTOMY    . INGUINAL HERNIA REPAIR Bilateral 1997  . KNEE ARTHROSCOPY Left ~ 1997; ~ 2005  . LUMBAR FUSION  2007; 04/11/2013  . PENILE DEBRIDEMENT  ~ 2011   X 3  FOR MRSA INFECTION    . SHOULDER ARTHROSCOPY Left ~ 2003  . TONSILLECTOMY  1990's  . TOTAL KNEE ARTHROPLASTY Right 07/23/2015  . TOTAL KNEE ARTHROPLASTY Right 07/23/2015   Procedure: TOTAL KNEE ARTHROPLASTY;  Surgeon: Valeria Batman, MD;  Location: [facility];  Service: Orthopedics;  Laterality: Right;  . TOTAL KNEE ARTHROPLASTY Left 10/03/2019   Procedure: LEFT TOTAL KNEE ARTHROPLASTY;  Surgeon: Valeria Batman, MD;  [facility];  Service: Orthopedics;  Laterality: Left;  . UVULOPALATOPHARYNGOPLASTY (UPPP)/TONSILLECTOMY/SEPTOPLASTY  1990's  . VASECTOMY     Social History   Occupational History  . Not on file  Tobacco Use  . Smoking status: Former Smoker    Packs/day: 0.50    Years: 15.00    Pack years: 7.50    Types: Cigarettes    Quit date: 10/14/1986    Years since quitting: 33.9  . Smokeless tobacco: Never Used  Vaping Use  . Vaping Use: Never used  Substance and Sexual Activity  . Alcohol use: Yes    Comment: occasional  . Drug use: No  . Sexual activity: Yes

## 2020-09-19 ENCOUNTER — Other Ambulatory Visit: Payer: Self-pay

## 2020-09-19 ENCOUNTER — Ambulatory Visit: Payer: Medicare Other | Admitting: Occupational Therapy

## 2020-09-19 DIAGNOSIS — M6281 Muscle weakness (generalized): Secondary | ICD-10-CM | POA: Diagnosis not present

## 2020-09-19 DIAGNOSIS — M79642 Pain in left hand: Secondary | ICD-10-CM | POA: Diagnosis not present

## 2020-09-19 DIAGNOSIS — M25642 Stiffness of left hand, not elsewhere classified: Secondary | ICD-10-CM | POA: Diagnosis not present

## 2020-09-19 DIAGNOSIS — R600 Localized edema: Secondary | ICD-10-CM | POA: Diagnosis not present

## 2020-09-19 NOTE — Patient Instructions (Signed)
Finger / Thumb Extensors    Place right fingers and thumb in center of putty donut, stretch out. Repeat __10__ times. Do __1__ sessions per day.  Copyright  VHI. All rights reserved.

## 2020-09-19 NOTE — Therapy (Signed)
[facility] [address] [place], [place], [address] Phone: [phone]   Fax:  [phone]  Occupational Therapy Treatment  Patient Details  Name: Larry [name] MRN: [id number] Date of Birth: [birth year] Referring Provider (OT): Dr. Erlinda Hong   Encounter Date: 09/19/2020   OT End of Session - 09/19/20 0824    Visit Number 6    Number of Visits 17    Date for OT Re-Evaluation 10/25/20    Authorization Type Medicare    Authorization - Visit Number 6    Authorization - Number of Visits 10    OT Start Time 0803    OT Stop Time 0845    OT Time Calculation (min) 42 min    Activity Tolerance Patient tolerated treatment well    Behavior During Therapy [facility] for tasks assessed/performed           Past Medical History:  Diagnosis Date  . Arthritis    "knees; left shoulder" (04/12/2013)  . Asthma    "as a child" (04/12/2013)  . DDD (degenerative disc disease), cervical   . DDD (degenerative disc disease), lumbar   . Difficult intubation    2012   CERVICAL FUSION   . Headache(784.0)    VERAPAMIL FOR PREVENTION   . 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 History of chicken pox    as an adult  . Hypoglycemia    last episode 1 yr. ago, just gets jittery  . Hypothyroidism   . Lumbar stenosis   . MRSA (methicillin resistant staph aureus) culture positive 09/10/2019   upper arm  . OSA (obstructive sleep apnea)    "haven't wore mask in years; had OR; still snore" (04/12/2013)  . Urinary frequency     Past Surgical History:  Procedure Laterality Date  . BACK SURGERY    . CARPOMETACARPEL SUSPENSION PLASTY Left 07/19/2020   Procedure: LEFT THUMB CARPOMETACARPAL ([place]) ARTHROPLASTY;  Surgeon: Leandrew Koyanagi, MD;  Location: [facility];  Service: Orthopedics;  Laterality: Left;  . CERVICAL FUSION  2002; 2012  . CYSTECTOMY     . INGUINAL HERNIA REPAIR Bilateral 1997  . KNEE ARTHROSCOPY Left ~ 1997; ~ 2005  . LUMBAR FUSION  2007; 04/11/2013  . PENILE DEBRIDEMENT  ~ 2011   X 3  FOR MRSA INFECTION    . SHOULDER ARTHROSCOPY Left ~ 2003  . TONSILLECTOMY  1990's  . TOTAL KNEE ARTHROPLASTY Right 07/23/2015  . TOTAL KNEE ARTHROPLASTY Right 07/23/2015   Procedure: TOTAL KNEE ARTHROPLASTY;  Surgeon: Garald Balding, MD;  Location: [facility];  Service: Orthopedics;  Laterality: Right;  . TOTAL KNEE ARTHROPLASTY Left 10/03/2019   Procedure: LEFT TOTAL KNEE ARTHROPLASTY;  Surgeon: Garald Balding, MD;  Location: [facility];  Service: Orthopedics;  Laterality: Left;  . UVULOPALATOPHARYNGOPLASTY (UPPP)/TONSILLECTOMY/SEPTOPLASTY  1990's  . VASECTOMY      There were no vitals filed for this visit.   Subjective Assessment - 09/19/20 0823    Subjective  I haven't really had much pain    Pertinent History s/p Left thumb carpometacarpal interposition arthroplasty, thumb  Left carpometacarpal reconstruction with tendon graft 07/19/20.      Pt is hypothyroidism, OA, asthma, back pain s/p lumbar fusion and cervical fusion, recent rotator cuff repair by Dr. Durward Fortes - PT is addressing.    Patient Stated Goals regain use of hand  Currently in Pain? No/denies                  Treatment: Fluidotherapy x 10 mins to LUE for pain and stiffness, no adverse reactions. A/ROM thumb flexion, IP flexion, opposition, radial abduction/ adduction, followed by gentle P/ROM MP thumb flexion. Reviewed red putty exercises for composite grip and tip pinch , added finger thumb palmar abduction Graded clothespins for yellow-green placing and removing form vertical antennae for sustained pinch, min v.c. for speed. Gripper set at level 1 for sustained grip, to pick up 1/2 the blocks, min difficulty/ v.c                  OT Short Term Goals - 09/17/20 [id number]      OT SHORT TERM GOAL #1   Title I with inital HEP.    Time 4    Period  Weeks    Status Achieved      OT SHORT TERM GOAL #2   Title I with splint wear, care and precautions    Time 4    Period Weeks    Status Achieved      OT SHORT TERM GOAL #3   Title Pt will increase L wrist flexion to 40* for increased functional use.    Time 4    Period Weeks    Status Achieved   55*     OT SHORT TERM GOAL #4   Title Pt will demonstrate ability to oppose all digits in prep for functional use of LUE.    Time 4    Period Weeks    Status On-going   difficulty opposing 5th digit     OT SHORT TERM GOAL #5   Title Pt will demonstrate full composite finger flexion in prep for ADLs.    Baseline 95%    Time 8    Period Weeks    Status On-going      OT SHORT TERM GOAL #6   Title I with edema control and scar massage    Time 4    Period Weeks    Status On-going   Pt has been educate in in scar massage            OT Long Term Goals - 08/30/20 1116      OT LONG TERM GOAL #1   Title I with updated HEP.    Time 8    Period Weeks    Status New      OT LONG TERM GOAL #2   Title Pt will demonstrate grip strength of at least 30 lbs for ADL performance.    Time 8    Period Weeks    Status New      OT LONG TERM GOAL #3   Title Pt will resume use of LUE as a non dominant assist at least 90% of the time with pain less than or equal to 3/10.    Time 8    Period Weeks    Status New      OT LONG TERM GOAL #4   Title Pt will demonstrate adequate 3 pt pinch strength in order  to place and remove an 8 lbs clip from antennae.    Baseline not tested due to precautions    Time 8    Period Weeks    Status New                 Plan - 09/19/20 0827    Clinical Impression Statement  Pt is progressing towards goals. He demonstrates understanding of red putty HEP.    OT Occupational Profile and History Detailed Assessment- Review of Records and additional review of physical, cognitive, psychosocial history related to current functional performance     Occupational performance deficits (Please refer to evaluation for details): ADL's;IADL's;Rest and Sleep;Work;Play;Leisure;Social Participation    Body Structure / Function / Physical Skills ADL;Dexterity;GMC;Pain;Strength;UE functional use;ROM;IADL;Endurance;Coordination;Flexibility;Mobility;Decreased knowledge of precautions;FMC;Sensation;Scar mobility    Rehab Potential Good    Clinical Decision Making Limited treatment options, no task modification necessary    Comorbidities Affecting Occupational Performance: May have comorbidities impacting occupational performance    Modification or Assistance to Complete Evaluation  No modification of tasks or assist necessary to complete eval    OT Frequency 2x / week    OT Duration 8 weeks    OT Treatment/Interventions Self-care/ADL training;Moist Heat;Fluidtherapy;DME and/or AE instruction;Splinting;Therapeutic activities;Contrast Bath;Ultrasound;Therapeutic exercise;Scar mobilization;Passive range of motion;Neuromuscular education;Cryotherapy;Electrical Stimulation;Paraffin;Energy conservation;Manual Therapy;Patient/family education    Plan fluidotherapy,continue light resistive tasks, ROM    Consulted and Agree with Plan of Care Patient           Patient will benefit from skilled therapeutic intervention in order to improve the following deficits and impairments:   Body Structure / Function / Physical Skills: ADL,Dexterity,GMC,Pain,Strength,UE functional use,ROM,IADL,Endurance,Coordination,Flexibility,Mobility,Decreased knowledge of precautions,FMC,Sensation,Scar mobility       Visit Diagnosis: Stiffness of left hand, not elsewhere classified  Muscle weakness (generalized)  Pain in left hand  Localized edema    Problem List Patient Active Problem List   Diagnosis Date Noted  . Post-operative state 08/20/2020  . Nontraumatic incomplete tear of left rotator cuff 08/20/2020  . AC (acromioclavicular) arthritis 08/20/2020  . Complete  tear of left rotator cuff 07/09/2020  . 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 Osteoarthritis of left AC (acromioclavicular) joint 05/07/2020  . Impingement syndrome of left shoulder 02/08/2020  . S/P TKR (total knee replacement) using cement, left 10/03/2019  . Cellulitis of arm 08/28/2019  . Essential hypertension 02/16/2018  . Sepsis due to skin infection ([place]) 02/16/2018  . Chronic pain 02/16/2018  . Cellulitis of left lower extremity   . Lumbar radiculopathy, chronic 01/24/2018  . Lumbar stenosis with neurogenic claudication 03/23/2017  . Hypothyroidism 07/25/2015  . Primary osteoarthritis of right knee 07/23/2015  . Primary osteoarthritis of knee 07/23/2015    Jun Osment 09/19/2020, 9:40 AM  [facility] [address] [place], [place], [address] Phone: [phone]   Fax:  [phone]  Name: Larry [name] MRN: [id number] Date of Birth: [birth year]

## 2020-09-24 ENCOUNTER — Other Ambulatory Visit: Payer: Self-pay

## 2020-09-24 ENCOUNTER — Ambulatory Visit: Payer: Medicare Other | Admitting: Occupational Therapy

## 2020-09-24 ENCOUNTER — Encounter: Payer: Self-pay | Admitting: Occupational Therapy

## 2020-09-24 DIAGNOSIS — M25642 Stiffness of left hand, not elsewhere classified: Secondary | ICD-10-CM

## 2020-09-24 DIAGNOSIS — M6281 Muscle weakness (generalized): Secondary | ICD-10-CM | POA: Diagnosis not present

## 2020-09-24 DIAGNOSIS — M79642 Pain in left hand: Secondary | ICD-10-CM

## 2020-09-24 DIAGNOSIS — R600 Localized edema: Secondary | ICD-10-CM

## 2020-09-24 NOTE — Therapy (Signed)
[facility] [address] [place], [place], [address] Phone: [phone]   Fax:  [phone]  Occupational Therapy Treatment  Patient Details  Name: Larry [name] MRN: [id number] Date of Birth: [birth year] Referring Provider (OT): Dr. Roda Shutters   Encounter Date: 09/24/2020   OT End of Session - 09/24/20 1422    Visit Number 7    Number of Visits 17    Date for OT Re-Evaluation 10/25/20    Authorization Type Medicare    Authorization - Visit Number 7    Authorization - Number of Visits 10    OT Start Time 1402    OT Stop Time 1442    OT Time Calculation (min) 40 min    Activity Tolerance Patient tolerated treatment well    Behavior During Therapy [facility] for tasks assessed/performed           Past Medical History:  Diagnosis Date  . Arthritis    "knees; left shoulder" (04/12/2013)  . Asthma    "as a child" (04/12/2013)  . DDD (degenerative disc disease), cervical   . DDD (degenerative disc disease), lumbar   . Difficult intubation    2012   CERVICAL FUSION   . Headache(784.0)    VERAPAMIL FOR PREVENTION   . 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 History of chicken pox    as an adult  . Hypoglycemia    last episode 1 yr. ago, just gets jittery  . Hypothyroidism   . Lumbar stenosis   . MRSA (methicillin resistant staph aureus) culture positive 09/10/2019   upper arm  . OSA (obstructive sleep apnea)    "haven't wore mask in years; had OR; still snore" (04/12/2013)  . Urinary frequency     Past Surgical History:  Procedure Laterality Date  . BACK SURGERY    . CARPOMETACARPEL SUSPENSION PLASTY Left 07/19/2020   Procedure: LEFT THUMB CARPOMETACARPAL ([facility]) ARTHROPLASTY;  Surgeon: Tarry Kos, MD;  Location: [facility];  Service: Orthopedics;  Laterality: Left;  . CERVICAL FUSION  2002; 2012  . CYSTECTOMY     . INGUINAL HERNIA REPAIR Bilateral 1997  . KNEE ARTHROSCOPY Left ~ 1997; ~ 2005  . LUMBAR FUSION  2007; 04/11/2013  . PENILE DEBRIDEMENT  ~ 2011   X 3  FOR MRSA INFECTION    . SHOULDER ARTHROSCOPY Left ~ 2003  . TONSILLECTOMY  1990's  . TOTAL KNEE ARTHROPLASTY Right 07/23/2015  . TOTAL KNEE ARTHROPLASTY Right 07/23/2015   Procedure: TOTAL KNEE ARTHROPLASTY;  Surgeon: Valeria Batman, MD;  Location: [facility];  Service: Orthopedics;  Laterality: Right;  . TOTAL KNEE ARTHROPLASTY Left 10/03/2019   Procedure: LEFT TOTAL KNEE ARTHROPLASTY;  Surgeon: Valeria Batman, MD;  Location: [facility];  Service: Orthopedics;  Laterality: Left;  . UVULOPALATOPHARYNGOPLASTY (UPPP)/TONSILLECTOMY/SEPTOPLASTY  1990's  . VASECTOMY      There were no vitals filed for this visit.   Subjective Assessment - 09/24/20 1413    Subjective  Pt reports mild pain in hand today    Pertinent History s/p Left thumb carpometacarpal interposition arthroplasty, thumb  Left carpometacarpal reconstruction with tendon graft 07/19/20.      Pt is hypothyroidism, OA, asthma, back pain s/p lumbar fusion and cervical fusion, recent rotator cuff repair by Dr. Cleophas Dunker - PT is addressing.    Patient Stated Goals regain use of hand  Currently in Pain? Yes    Pain Score 3     Pain Location Hand    Pain Orientation Left    Pain Descriptors / Indicators Aching    Pain Type Acute pain    Pain Onset More than a month ago    Pain Frequency Intermittent    Aggravating Factors  movement    Pain Relieving Factors rest, heat, meds                  Treatment: Fluidotherapy x 10 mins to LUE for pain and stiffness, no adverse reactions. A/ROM thumb flexion,  opposition, radial abduction/ adduction, followed by gentle P/ROM MP thumb flexion. Tendon gliding exercises x 10 reps, finger abduction/ adduction x 10 reps each, min v.c for all exercises, pt moves too quickly at times.Graded clothespins in standing for yellow-green placing  and removing from vertical antennae for sustained pinch, min v.c. for speed. Gripper set at level 2 for sustained grip, to pick up the blocks, min difficulty/ v.c (attempted level 3 however it was too resistive.)  Ice pack x 6 mins at end of session due to edema                OT Short Term Goals - 09/24/20 1422      OT SHORT TERM GOAL #1   Title I with inital HEP.    Time 4    Period Weeks    Status Achieved      OT SHORT TERM GOAL #2   Title I with splint wear, care and precautions    Time 4    Period Weeks    Status Achieved      OT SHORT TERM GOAL #3   Title Pt will increase L wrist flexion to 40* for increased functional use.    Time 4    Period Weeks    Status Achieved   55*     OT SHORT TERM GOAL #4   Title Pt will demonstrate ability to oppose all digits in prep for functional use of LUE.    Time 4    Period Weeks    Status On-going   difficulty opposing 5th digit     OT SHORT TERM GOAL #5   Title Pt will demonstrate full composite finger flexion in prep for ADLs.    Baseline 95%    Time 8    Period Weeks    Status On-going      OT SHORT TERM GOAL #6   Title I with edema control and scar massage    Time 4    Period Weeks    Status On-going   Pt has been educate in in scar massage            OT Long Term Goals - 09/24/20 1424      OT LONG TERM GOAL #1   Title I with updated HEP.    Time 8    Period Weeks    Status On-going      OT LONG TERM GOAL #2   Title Pt will demonstrate grip strength of at least 30 lbs for ADL performance. revised-Pt will increase LUE grip strength to 52 lbs or greater.    Time 8    Period Weeks    Status Revised   46.7- inital goal     OT LONG TERM GOAL #3   Title Pt will resume use of LUE as a non dominant assist at least 90% of the time  with pain less than or equal to 3/10.    Time 8    Period Weeks    Status On-going      OT LONG TERM GOAL #4   Title Pt will demonstrate adequate 3 pt pinch strength in  order  to place and remove an 8 lbs clip from antennae.    Baseline not tested due to precautions    Time 8    Period Weeks    Status On-going                 Plan - 09/24/20 1418    Clinical Impression Statement Pt is progressing towards goals. He demonstrates improving ROM and strength.    OT Occupational Profile and History Detailed Assessment- Review of Records and additional review of physical, cognitive, psychosocial history related to current functional performance    Occupational performance deficits (Please refer to evaluation for details): ADL's;IADL's;Rest and Sleep;Work;Play;Leisure;Social Participation    Body Structure / Function / Physical Skills ADL;Dexterity;GMC;Pain;Strength;UE functional use;ROM;IADL;Endurance;Coordination;Flexibility;Mobility;Decreased knowledge of precautions;FMC;Sensation;Scar mobility    Rehab Potential Good    Clinical Decision Making Limited treatment options, no task modification necessary    Comorbidities Affecting Occupational Performance: May have comorbidities impacting occupational performance    Modification or Assistance to Complete Evaluation  No modification of tasks or assist necessary to complete eval    OT Frequency 2x / week    OT Duration 8 weeks    OT Treatment/Interventions Self-care/ADL training;Moist Heat;Fluidtherapy;DME and/or AE instruction;Splinting;Therapeutic activities;Contrast Bath;Ultrasound;Therapeutic exercise;Scar mobilization;Passive range of motion;Neuromuscular education;Cryotherapy;Electrical Stimulation;Paraffin;Energy conservation;Manual Therapy;Patient/family education    Plan fluidotherapy,continue light resistive tasks, ROM, pt is 9.5 weeks post op    Consulted and Agree with Plan of Care Patient           Patient will benefit from skilled therapeutic intervention in order to improve the following deficits and impairments:   Body Structure / Function / Physical Skills:  ADL,Dexterity,GMC,Pain,Strength,UE functional use,ROM,IADL,Endurance,Coordination,Flexibility,Mobility,Decreased knowledge of precautions,FMC,Sensation,Scar mobility       Visit Diagnosis: Stiffness of left hand, not elsewhere classified  Muscle weakness (generalized)  Pain in left hand  Localized edema    Problem List Patient Active Problem List   Diagnosis Date Noted  . Post-operative state 08/20/2020  . Nontraumatic incomplete tear of left rotator cuff 08/20/2020  . AC (acromioclavicular) arthritis 08/20/2020  . Complete tear of left rotator cuff 07/09/2020  . Osteoarthritis of first carpometacarpal [facility]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 05/07/2020  . Osteoarthritis of left AC (acromioclavicular) joint 05/07/2020  . Impingement syndrome of left shoulder 02/08/2020  . S/P TKR (total knee replacement) using cement, left 10/03/2019  . Cellulitis of arm 08/28/2019  . Essential hypertension 02/16/2018  . Sepsis due to skin infection ([place]) 02/16/2018  . Chronic pain 02/16/2018  . Cellulitis of left lower extremity   . Lumbar radiculopathy, chronic 01/24/2018  . Lumbar stenosis with neurogenic claudication 03/23/2017  . Hypothyroidism 07/25/2015  . Primary osteoarthritis of right knee 07/23/2015  . Primary osteoarthritis of knee 07/23/2015    Mariselda Badalamenti 09/24/2020, 2:31 PM  [facility] [address] [place], [place], [address] Phone: [phone]   Fax:  [phone]  Name: Larry [name] MRN: [id number] Date of Birth: Jul 24, 1951

## 2020-09-26 ENCOUNTER — Other Ambulatory Visit: Payer: Self-pay

## 2020-09-26 ENCOUNTER — Ambulatory Visit: Payer: Medicare Other | Admitting: Occupational Therapy

## 2020-09-26 DIAGNOSIS — M6281 Muscle weakness (generalized): Secondary | ICD-10-CM | POA: Diagnosis not present

## 2020-09-26 DIAGNOSIS — M25642 Stiffness of left hand, not elsewhere classified: Secondary | ICD-10-CM | POA: Diagnosis not present

## 2020-09-26 DIAGNOSIS — M79642 Pain in left hand: Secondary | ICD-10-CM | POA: Diagnosis not present

## 2020-09-26 DIAGNOSIS — R600 Localized edema: Secondary | ICD-10-CM | POA: Diagnosis not present

## 2020-09-26 NOTE — Therapy (Signed)
[facility] [address] [place], [place], [address] Phone: [phone]   Fax:  [phone]  Occupational Therapy Treatment  Patient Details  Name: Larry [name] MRN: [id number] Date of Birth: [birth year] Referring Provider (OT): Dr. Roda Shutters   Encounter Date: 09/26/2020   OT End of Session - 09/26/20 0925    Visit Number 8    Number of Visits 17    Date for OT Re-Evaluation 10/25/20    Authorization Type Medicare    Authorization - Visit Number 8    Authorization - Number of Visits 10    OT Start Time 0845    OT Stop Time 0925    OT Time Calculation (min) 40 min    Activity Tolerance Patient tolerated treatment well    Behavior During Therapy [facility] for tasks assessed/performed           Past Medical History:  Diagnosis Date  . Arthritis    "knees; left shoulder" (04/12/2013)  . Asthma    "as a child" (04/12/2013)  . DDD (degenerative disc disease), cervical   . DDD (degenerative disc disease), lumbar   . Difficult intubation    2012   CERVICAL FUSION   . Headache(784.0)    VERAPAMIL FOR PREVENTION   . 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 History of chicken pox    as an adult  . Hypoglycemia    last episode 1 yr. ago, just gets jittery  . Hypothyroidism   . Lumbar stenosis   . MRSA (methicillin resistant staph aureus) culture positive 09/10/2019   upper arm  . OSA (obstructive sleep apnea)    "haven't wore mask in years; had OR; still snore" (04/12/2013)  . Urinary frequency     Past Surgical History:  Procedure Laterality Date  . BACK SURGERY    . CARPOMETACARPEL SUSPENSION PLASTY Left 07/19/2020   Procedure: LEFT THUMB CARPOMETACARPAL (CMC) ARTHROPLASTY;  Surgeon: Tarry Kos, MD;  Location: [facility];  Service: Orthopedics;  Laterality: Left;  . CERVICAL FUSION  2002; 2012  . CYSTECTOMY     . INGUINAL HERNIA REPAIR Bilateral 1997  . KNEE ARTHROSCOPY Left ~ 1997; ~ 2005  . LUMBAR FUSION  2007; 04/11/2013  . PENILE DEBRIDEMENT  ~ 2011   X 3  FOR MRSA INFECTION    . SHOULDER ARTHROSCOPY Left ~ 2003  . TONSILLECTOMY  1990's  . TOTAL KNEE ARTHROPLASTY Right 07/23/2015  . TOTAL KNEE ARTHROPLASTY Right 07/23/2015   Procedure: TOTAL KNEE ARTHROPLASTY;  Surgeon: Valeria Batman, MD;  Location: [facility];  Service: Orthopedics;  Laterality: Right;  . TOTAL KNEE ARTHROPLASTY Left 10/03/2019   Procedure: LEFT TOTAL KNEE ARTHROPLASTY;  Surgeon: Valeria Batman, MD;  Location: [facility];  Service: Orthopedics;  Laterality: Left;  . UVULOPALATOPHARYNGOPLASTY (UPPP)/TONSILLECTOMY/SEPTOPLASTY  1990's  . VASECTOMY      There were no vitals filed for this visit.   Subjective Assessment - 09/26/20 0849    Subjective  Pt reports no pain in hand today    Pertinent History s/p Left thumb carpometacarpal interposition arthroplasty, thumb  Left carpometacarpal reconstruction with tendon graft 07/19/20.      Pt is hypothyroidism, OA, asthma, back pain s/p lumbar fusion and cervical fusion, recent rotator cuff repair by Dr. Cleophas Dunker - PT is addressing.    Patient Stated Goals regain use of hand  Currently in Pain? No/denies    Pain Onset More than a month ago                        OT Treatments/Exercises (OP) - 09/26/20 0001      ADLs   ADL Comments Assessed remaining STG's and some LTG's. Practiced opening different containers/jars Lt hand with A/E to open jars.      Hand Exercises   Other Hand Exercises A/ROM for tendon glicding, thumb palmer abduction, thumb flexion, and opposition. Gripper set at level 2 resistance to pick up blocks Lt hand for sustained grip strength with min difficulty but no rest breaks required - pt reports easier than last time.    Other Hand Exercises Clothespin activity for pinch strength - yellow to blue resistance      LUE Fluidotherapy   Number  Minutes Fluidotherapy 10 Minutes    LUE Fluidotherapy Location Hand;Wrist    Comments At beginning of session to decrease stiffness                    OT Short Term Goals - 09/26/20 0925      OT SHORT TERM GOAL #1   Title I with inital HEP.    Time 4    Period Weeks    Status Achieved      OT SHORT TERM GOAL #2   Title I with splint wear, care and precautions    Time 4    Period Weeks    Status Achieved      OT SHORT TERM GOAL #3   Title Pt will increase L wrist flexion to 40* for increased functional use.    Time 4    Period Weeks    Status Achieved   55*     OT SHORT TERM GOAL #4   Title Pt will demonstrate ability to oppose all digits in prep for functional use of LUE.    Time 4    Period Weeks    Status On-going   difficulty opposing 5th digit     OT SHORT TERM GOAL #5   Title Pt will demonstrate full composite finger flexion in prep for ADLs.    Baseline 95%    Time 8    Period Weeks    Status Achieved      OT SHORT TERM GOAL #6   Title I with edema control and scar massage    Time 4    Period Weeks    Status Achieved   Pt has been educate in in scar massage            OT Long Term Goals - 09/26/20 [id number]      OT LONG TERM GOAL #1   Title I with updated HEP.    Time 8    Period Weeks    Status Achieved      OT LONG TERM GOAL #2   Title Pt will demonstrate grip strength of at least 30 lbs for ADL performance. revised-Pt will increase LUE grip strength to 52 lbs or greater.    Time 8    Period Weeks    Status Revised   46.7- inital goal     OT LONG TERM GOAL #3   Title Pt will resume use of LUE as a non dominant assist at least 90% of the time with pain less than or equal to 3/10.    Time 8    Period Weeks  Status On-going      OT LONG TERM GOAL #4   Title Pt will demonstrate adequate 3 pt pinch strength in order  to place and remove an 8 lbs clip from antennae.    Baseline not tested due to precautions    Time 8    Period Weeks     Status On-going                 Plan - 09/26/20 0865    Clinical Impression Statement Pt is progressing towards goals. He demonstrates improving ROM and strength.    OT Occupational Profile and History Detailed Assessment- Review of Records and additional review of physical, cognitive, psychosocial history related to current functional performance    Occupational performance deficits (Please refer to evaluation for details): ADL's;IADL's;Rest and Sleep;Work;Play;Leisure;Social Participation    Body Structure / Function / Physical Skills ADL;Dexterity;GMC;Pain;Strength;UE functional use;ROM;IADL;Endurance;Coordination;Flexibility;Mobility;Decreased knowledge of precautions;FMC;Sensation;Scar mobility    Rehab Potential Good    Clinical Decision Making Limited treatment options, no task modification necessary    Comorbidities Affecting Occupational Performance: May have comorbidities impacting occupational performance    Modification or Assistance to Complete Evaluation  No modification of tasks or assist necessary to complete eval    OT Frequency 2x / week    OT Duration 8 weeks    OT Treatment/Interventions Self-care/ADL training;Moist Heat;Fluidtherapy;DME and/or AE instruction;Splinting;Therapeutic activities;Contrast Bath;Ultrasound;Therapeutic exercise;Scar mobilization;Passive range of motion;Neuromuscular education;Cryotherapy;Electrical Stimulation;Paraffin;Energy conservation;Manual Therapy;Patient/family education    Plan fluidotherapy,continue light resistive tasks, possible d/c end of next week    Consulted and Agree with Plan of Care Patient           Patient will benefit from skilled therapeutic intervention in order to improve the following deficits and impairments:   Body Structure / Function / Physical Skills: ADL,Dexterity,GMC,Pain,Strength,UE functional use,ROM,IADL,Endurance,Coordination,Flexibility,Mobility,Decreased knowledge of precautions,FMC,Sensation,Scar  mobility       Visit Diagnosis: Stiffness of left hand, not elsewhere classified  Muscle weakness (generalized)  Localized edema    Problem List Patient Active Problem List   Diagnosis Date Noted  . Post-operative state 08/20/2020  . Nontraumatic incomplete tear of left rotator cuff 08/20/2020  . AC (acromioclavicular) arthritis 08/20/2020  . Complete tear of left rotator cuff 07/09/2020  . 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 Osteoarthritis of left AC (acromioclavicular) joint 05/07/2020  . Impingement syndrome of left shoulder 02/08/2020  . S/P TKR (total knee replacement) using cement, left 10/03/2019  . Cellulitis of arm 08/28/2019  . Essential hypertension 02/16/2018  . Sepsis due to skin infection (HCC) 02/16/2018  . Chronic pain 02/16/2018  . Cellulitis of left lower extremity   . Lumbar radiculopathy, chronic 01/24/2018  . Lumbar stenosis with neurogenic claudication 03/23/2017  . Hypothyroidism 07/25/2015  . Primary osteoarthritis of right knee 07/23/2015  . Primary osteoarthritis of knee 07/23/2015    Kelli Churn, OTR/L 09/26/2020, 9:28 AM  [facility] [address] [place], [place], [address] Phone: [phone]   Fax:  [phone]  Name: SHAWNE ESKELSON MRN: [id number] Date of Birth: [birth year]

## 2020-09-30 ENCOUNTER — Telehealth: Payer: Self-pay | Admitting: Orthopaedic Surgery

## 2020-09-30 NOTE — Telephone Encounter (Signed)
Called and spoke with patient. He states that PT has changed his exercises and since doing so his pain level has really increased. He is scheduled to see Dr.Whitfield this Thursday but wanted to come in sooner if possible. I have scheduled him to be seen tomorrow afternoon.

## 2020-09-30 NOTE — Telephone Encounter (Signed)
Pt would like to call him back about his shoulder. He said its in severe pain. Please call him [phone]

## 2020-10-01 ENCOUNTER — Ambulatory Visit: Payer: Medicare Other | Admitting: Occupational Therapy

## 2020-10-01 ENCOUNTER — Ambulatory Visit (INDEPENDENT_AMBULATORY_CARE_PROVIDER_SITE_OTHER): Payer: No Typology Code available for payment source | Admitting: Orthopaedic Surgery

## 2020-10-01 ENCOUNTER — Encounter: Payer: Self-pay | Admitting: Orthopaedic Surgery

## 2020-10-01 ENCOUNTER — Other Ambulatory Visit: Payer: Self-pay

## 2020-10-01 VITALS — Ht 66.0 in | Wt 184.0 lb

## 2020-10-01 DIAGNOSIS — S46012D Strain of muscle(s) and tendon(s) of the rotator cuff of left shoulder, subsequent encounter: Secondary | ICD-10-CM

## 2020-10-01 NOTE — Progress Notes (Signed)
Office Visit Note   Patient: Larry [name]           Date of Birth: [birth year]           MRN: [id number] Visit Date: 10/01/2020              Requested by: Merrilee Seashore, [address] [place],  [place] [address] PCP: Merrilee Seashore, MD   Assessment & Plan: Visit Diagnoses:  1. Traumatic complete tear of left rotator cuff, subsequent encounter     Plan: Nearly 7 weeks status post left shoulder arthroscopic SCD, DCR, rotator cuff tear repair and biceps tenodesis.  Was doing well until last week and physical therapy began to experience some pain after a certain "maneuver".  Today he was able to actively raise his arm over his head slowly and his incisions are well-healed.  Biceps appears to be in a good position.  Good grip and good release.  Does have impingement testing.  Going to have him hold on therapy for a week and see if it does not improve.  He certainly can try Advil or Aleve and use ice or heat.  We will check him back in a week  Follow-Up Instructions: Return in about 1 week (around 10/08/2020).   Orders:  No orders of the defined types were placed in this encounter.  No orders of the defined types were placed in this encounter.     Procedures: No procedures performed   Clinical Data: No additional findings.   Subjective: Chief Complaint  Patient presents with  . Left Shoulder - Follow-up    Left shoulder scope 08/15/2020  Patient presents today for follow up on his left shoulder. He had a left shoulder arthroscopy on 08/15/2020. He is now almost 7 weeks out from surgery. He said that he has recently started new exercises at physical therapy, and has also developed more intense pain with those exercises. He is going to therapy three times weekly. He is having to take Vicodin for his pain. He is wearing his sling today to see if that would help alleviate the pain. His pain is located at his proximal left arm.   HPI  Review of  Systems   Objective: Vital Signs: Ht 5\' 6"  (1.676 m)   Wt 184 lb (83.5 kg)   BMI 29.70 kg/m   Physical Exam  Ortho Exam left shoulder incisions of healed without problem.  Does have impingement testing but tickly with external rotation.  I could place his arm fully overhead but slowly.  He can maintain that position.  Biceps appears to be in good position good grip and good release  Specialty Comments:  No specialty comments available.  Imaging: No results found.   PMFS History: Patient Active Problem List   Diagnosis Date Noted  . Post-operative state 08/20/2020  . Nontraumatic incomplete tear of left rotator cuff 08/20/2020  . AC (acromioclavicular) arthritis 08/20/2020  . Complete tear of left rotator cuff 07/09/2020  . Osteoarthritis of first carpometacarpal [facility]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 05/07/2020  . Osteoarthritis of left AC (acromioclavicular) joint 05/07/2020  . Impingement syndrome of left shoulder 02/08/2020  . S/P TKR (total knee replacement) using cement, left 10/03/2019  . Cellulitis of arm 08/28/2019  . Essential hypertension 02/16/2018  . Sepsis due to skin infection ([place]) 02/16/2018  . Chronic pain 02/16/2018  . Cellulitis of left lower extremity   . Lumbar radiculopathy, chronic 01/24/2018  . Lumbar stenosis with neurogenic claudication 03/23/2017  .  Hypothyroidism 07/25/2015  . Primary osteoarthritis of right knee 07/23/2015  . Primary osteoarthritis of knee 07/23/2015   Past Medical History:  Diagnosis Date  . Arthritis    "knees; left shoulder" (04/12/2013)  . Asthma    "as a child" (04/12/2013)  . DDD (degenerative disc disease), cervical   . DDD (degenerative disc disease), lumbar   . Difficult intubation    2012   CERVICAL FUSION   . Headache(784.0)    VERAPAMIL FOR PREVENTION   . 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 History of chicken pox    as an adult   . Hypoglycemia    last episode 1 yr. ago, just gets jittery  . Hypothyroidism   . Lumbar stenosis   . MRSA (methicillin resistant staph aureus) culture positive 09/10/2019   upper arm  . OSA (obstructive sleep apnea)    "haven't wore mask in years; had OR; still snore" (04/12/2013)  . Urinary frequency     History reviewed. No pertinent family history.  Past Surgical History:  Procedure Laterality Date  . BACK SURGERY    . CARPOMETACARPEL SUSPENSION PLASTY Left 07/19/2020   Procedure: LEFT THUMB CARPOMETACARPAL (CMC) ARTHROPLASTY;  Surgeon: Tarry Kos, MD;  Location: [facility];  Service: Orthopedics;  Laterality: Left;  . CERVICAL FUSION  2002; 2012  . CYSTECTOMY    . INGUINAL HERNIA REPAIR Bilateral 1997  . KNEE ARTHROSCOPY Left ~ 1997; ~ 2005  . LUMBAR FUSION  2007; 04/11/2013  . PENILE DEBRIDEMENT  ~ 2011   X 3  FOR MRSA INFECTION    . SHOULDER ARTHROSCOPY Left ~ 2003  . TONSILLECTOMY  1990's  . TOTAL KNEE ARTHROPLASTY Right 07/23/2015  . TOTAL KNEE ARTHROPLASTY Right 07/23/2015   Procedure: TOTAL KNEE ARTHROPLASTY;  Surgeon: Valeria Batman, MD;  Location: [facility] OR;  Service: Orthopedics;  Laterality: Right;  . TOTAL KNEE ARTHROPLASTY Left 10/03/2019   Procedure: LEFT TOTAL KNEE ARTHROPLASTY;  Surgeon: Valeria Batman, MD;  Location: [facility];  Service: Orthopedics;  Laterality: Left;  . UVULOPALATOPHARYNGOPLASTY (UPPP)/TONSILLECTOMY/SEPTOPLASTY  1990's  . VASECTOMY     Social History   Occupational History  . Not on file  Tobacco Use  . Smoking status: Former Smoker    Packs/day: 0.50    Years: 15.00    Pack years: 7.50    Types: Cigarettes    Quit date: 10/14/1986    Years since quitting: 33.9  . Smokeless tobacco: Never Used  Vaping Use  . Vaping Use: Never used  Substance and Sexual Activity  . Alcohol use: Yes    Comment: occasional  . Drug use: No  . Sexual activity: Yes

## 2020-10-02 ENCOUNTER — Encounter: Payer: Self-pay | Admitting: Physician Assistant

## 2020-10-02 ENCOUNTER — Ambulatory Visit (INDEPENDENT_AMBULATORY_CARE_PROVIDER_SITE_OTHER): Payer: Medicare Other | Admitting: Physician Assistant

## 2020-10-02 DIAGNOSIS — M189 Osteoarthritis of first carpometacarpal joint, unspecified: Secondary | ICD-10-CM

## 2020-10-02 NOTE — Progress Notes (Signed)
Post-Op Visit Note   Patient: Larry [name]           Date of Birth: [birth year]           MRN: [id number] Visit Date: 10/02/2020 PCP: Merrilee Seashore, MD   Assessment & Plan:  Chief Complaint:  Chief Complaint  Patient presents with  . Left Thumb - Routine Post Op, Follow-up   Visit Diagnoses:  1. 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  He has been doing well.  He denies any pain.  He stopped physical therapy this week due to increased pain in his left shoulder.  He is 7 weeks out left shoulder rotator cuff repair by Dr. Durward Fortes.  He notes that he is scheduled to restart next week.  Physical therapy has weaned him into a soft neoprene CMC splint which she wears throughout the day.  He feels that he has made great progress with range of motion.  He has recently started strengthening exercises.  Examination of his right hand reveals full opposition of the thumb with the index, long, ring and small fingers.  He is nearly able to make a full fist.  He is neurovascular intact distally.  At this point, I recommended that he continue to wear his splint at night and wear his thermoplastic splint if he is lifting anything heavy.  He may continue with resistance training.  He will follow up with [place] in 6 weeks time for recheck.  Call with concerns or questions.  Follow-Up Instructions: Return in about 6 weeks (around 11/13/2020).   Orders:  No orders of the defined types were placed in this encounter.  No orders of the defined types were placed in this encounter.   Imaging: No new imaging  PMFS History: Patient Active Problem List   Diagnosis Date Noted  . Post-operative state 08/20/2020  . Nontraumatic incomplete tear of left rotator cuff 08/20/2020  . AC (acromioclavicular) arthritis 08/20/2020  . Complete tear of left rotator cuff  07/09/2020  . Osteoarthritis of first carpometacarpal [facility]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 05/07/2020  . Osteoarthritis of left AC (acromioclavicular) joint 05/07/2020  . Impingement syndrome of left shoulder 02/08/2020  . S/P TKR (total knee replacement) using cement, left 10/03/2019  . Cellulitis of arm 08/28/2019  . Essential hypertension 02/16/2018  . Sepsis due to skin infection ([place]) 02/16/2018  . Chronic pain 02/16/2018  . Cellulitis of left lower extremity   . Lumbar radiculopathy, chronic 01/24/2018  . Lumbar stenosis with neurogenic claudication 03/23/2017  . Hypothyroidism 07/25/2015  . Primary osteoarthritis of right knee 07/23/2015  . Primary osteoarthritis of knee 07/23/2015   Past Medical History:  Diagnosis Date  . Arthritis    "knees; left shoulder" (04/12/2013)  . Asthma    "as a child" (04/12/2013)  . DDD (degenerative disc disease), cervical   . DDD (degenerative disc disease), lumbar   . Difficult intubation    2012   CERVICAL FUSION   . Headache(784.0)    VERAPAMIL FOR PREVENTION   . 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 History of chicken pox    as an adult  . Hypoglycemia    last episode 1 yr. ago, just gets jittery  . Hypothyroidism   . Lumbar stenosis   .  MRSA (methicillin resistant staph aureus) culture positive 09/10/2019   upper arm  . OSA (obstructive sleep apnea)    "haven't wore mask in years; had OR; still snore" (04/12/2013)  . Urinary frequency     History reviewed. No pertinent family history.  Past Surgical History:  Procedure Laterality Date  . BACK SURGERY    . CARPOMETACARPEL SUSPENSION PLASTY Left 07/19/2020   Procedure: LEFT THUMB CARPOMETACARPAL (CMC) ARTHROPLASTY;  Surgeon: Tarry Kos, MD;  Location: [facility];  Service: Orthopedics;  Laterality: Left;  . CERVICAL FUSION  2002; 2012  . CYSTECTOMY    . INGUINAL HERNIA REPAIR  Bilateral 1997  . KNEE ARTHROSCOPY Left ~ 1997; ~ 2005  . LUMBAR FUSION  2007; 04/11/2013  . PENILE DEBRIDEMENT  ~ 2011   X 3  FOR MRSA INFECTION    . SHOULDER ARTHROSCOPY Left ~ 2003  . TONSILLECTOMY  1990's  . TOTAL KNEE ARTHROPLASTY Right 07/23/2015  . TOTAL KNEE ARTHROPLASTY Right 07/23/2015   Procedure: TOTAL KNEE ARTHROPLASTY;  Surgeon: Valeria Batman, MD;  [facility];  Service: Orthopedics;  Laterality: Right;  . TOTAL KNEE ARTHROPLASTY Left 10/03/2019   Procedure: LEFT TOTAL KNEE ARTHROPLASTY;  Surgeon: Valeria Batman, MD;  Location: [facility];  Service: Orthopedics;  Laterality: Left;  . UVULOPALATOPHARYNGOPLASTY (UPPP)/TONSILLECTOMY/SEPTOPLASTY  1990's  . VASECTOMY     Social History   Occupational History  . Not on file  Tobacco Use  . Smoking status: Former Smoker    Packs/day: 0.50    Years: 15.00    Pack years: 7.50    Types: Cigarettes    Quit date: 10/14/1986    Years since quitting: 33.9  . Smokeless tobacco: Never Used  Vaping Use  . Vaping Use: Never used  Substance and Sexual Activity  . Alcohol use: Yes    Comment: occasional  . Drug use: No  . Sexual activity: Yes

## 2020-10-03 ENCOUNTER — Ambulatory Visit: Payer: Medicare Other | Admitting: Occupational Therapy

## 2020-10-03 ENCOUNTER — Ambulatory Visit: Payer: Medicare Other | Admitting: Orthopaedic Surgery

## 2020-10-08 ENCOUNTER — Other Ambulatory Visit: Payer: Self-pay

## 2020-10-08 ENCOUNTER — Encounter: Payer: Self-pay | Admitting: Occupational Therapy

## 2020-10-08 ENCOUNTER — Ambulatory Visit: Payer: Medicare Other | Attending: Orthopaedic Surgery | Admitting: Occupational Therapy

## 2020-10-08 ENCOUNTER — Ambulatory Visit (INDEPENDENT_AMBULATORY_CARE_PROVIDER_SITE_OTHER): Payer: No Typology Code available for payment source | Admitting: Orthopaedic Surgery

## 2020-10-08 ENCOUNTER — Encounter: Payer: Self-pay | Admitting: Orthopaedic Surgery

## 2020-10-08 VITALS — Ht 66.0 in | Wt 184.0 lb

## 2020-10-08 DIAGNOSIS — M25642 Stiffness of left hand, not elsewhere classified: Secondary | ICD-10-CM | POA: Diagnosis not present

## 2020-10-08 DIAGNOSIS — M79642 Pain in left hand: Secondary | ICD-10-CM | POA: Diagnosis not present

## 2020-10-08 DIAGNOSIS — M6281 Muscle weakness (generalized): Secondary | ICD-10-CM | POA: Diagnosis not present

## 2020-10-08 DIAGNOSIS — R600 Localized edema: Secondary | ICD-10-CM | POA: Insufficient documentation

## 2020-10-08 DIAGNOSIS — S46012D Strain of muscle(s) and tendon(s) of the rotator cuff of left shoulder, subsequent encounter: Secondary | ICD-10-CM

## 2020-10-08 DIAGNOSIS — M25662 Stiffness of left knee, not elsewhere classified: Secondary | ICD-10-CM | POA: Diagnosis not present

## 2020-10-08 NOTE — Therapy (Signed)
[facility] [address] [place], [place], [address] Phone: [phone]   Fax:  [phone]  Occupational Therapy Treatment  Patient Details  Name: Larry [name] MRN: [id number] Date of Birth: May 21, 1951 Referring Provider (OT): Dr. Erlinda Hong   Encounter Date: 10/08/2020   OT End of Session - 10/08/20 0820    Visit Number 9    Number of Visits 17    Date for OT Re-Evaluation 10/25/20    Authorization Type Medicare    Authorization Time Period cert for 90 days    Authorization - Visit Number 9    Authorization - Number of Visits 10    OT Start Time 0800    OT Stop Time 0843    OT Time Calculation (min) 43 min           Past Medical History:  Diagnosis Date  . Arthritis    "knees; left shoulder" (04/12/2013)  . Asthma    "as a child" (04/12/2013)  . DDD (degenerative disc disease), cervical   . DDD (degenerative disc disease), lumbar   . Difficult intubation    2012   CERVICAL FUSION   . Headache(784.0)    VERAPAMIL FOR PREVENTION   . 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 History of chicken pox    as an adult  . Hypoglycemia    last episode 1 yr. ago, just gets jittery  . Hypothyroidism   . Lumbar stenosis   . MRSA (methicillin resistant staph aureus) culture positive 09/10/2019   upper arm  . OSA (obstructive sleep apnea)    "haven't wore mask in years; had OR; still snore" (04/12/2013)  . Urinary frequency     Past Surgical History:  Procedure Laterality Date  . BACK SURGERY    . CARPOMETACARPEL SUSPENSION PLASTY Left 07/19/2020   Procedure: LEFT THUMB CARPOMETACARPAL ([facility]) ARTHROPLASTY;  Surgeon: Leandrew Koyanagi, MD;  Location: [facility];  Service: Orthopedics;  Laterality: Left;  . CERVICAL FUSION  2002; 2012  . CYSTECTOMY    . INGUINAL HERNIA REPAIR Bilateral 1997  . KNEE ARTHROSCOPY Left ~  1997; ~ 2005  . LUMBAR FUSION  2007; 04/11/2013  . PENILE DEBRIDEMENT  ~ 2011   X 3  FOR MRSA INFECTION    . SHOULDER ARTHROSCOPY Left ~ 2003  . TONSILLECTOMY  1990's  . TOTAL KNEE ARTHROPLASTY Right 07/23/2015  . TOTAL KNEE ARTHROPLASTY Right 07/23/2015   Procedure: TOTAL KNEE ARTHROPLASTY;  Surgeon: Garald Balding, MD;  Location: [facility];  Service: Orthopedics;  Laterality: Right;  . TOTAL KNEE ARTHROPLASTY Left 10/03/2019   Procedure: LEFT TOTAL KNEE ARTHROPLASTY;  Surgeon: Garald Balding, MD;  [facility];  Service: Orthopedics;  Laterality: Left;  . UVULOPALATOPHARYNGOPLASTY (UPPP)/TONSILLECTOMY/SEPTOPLASTY  1990's  . VASECTOMY      There were no vitals filed for this visit.   Subjective Assessment - 10/08/20 0819    Subjective  Pt reports no pain in hand today    Pertinent History s/p Left thumb carpometacarpal interposition arthroplasty, thumb  Left carpometacarpal reconstruction with tendon graft 07/19/20.      Pt is hypothyroidism, OA, asthma, back pain s/p lumbar fusion and cervical fusion, recent rotator cuff repair by Dr. Durward Fortes - PT is addressing.    Patient Stated Goals regain use of hand    Currently in Pain? No/denies    Multiple  Pain Sites Yes    Pain Score 5    Pain Location Shoulder    Pain Orientation Left    Pain Descriptors / Indicators Aching    Pain Type Acute pain    Pain Onset More than a month ago    Pain Frequency Intermittent    Aggravating Factors  oveuse    Pain Relieving Factors rest                 Treatment: Fluidotherapy x 10 mins to LUE for pain and stiffness, no adverse reactions. A/ROM thumb flexion,  opposition,followed by gentle P/ROM MP thumb flexion. Tendon gliding exercises x 10 reps, finger abduction/ adduction x 10 reps each, min v.c for all exercises. Graded clothespins in standing for black clothespins placing and removing from vertical antennae for sustained pinch, min v.c. for speed.  Gripper set at level 2 for  sustained grip, to pick up the blocks, min difficulty/ v.c   Discussed plans to potentially wrap up next visit. Pt reports his hand is doing well but his shoulder has been bothering him.           OT Education - 10/08/20 1241    Education Details green theraputty issued for sustained grip, pt was instructed to continue using red putty for sustained pinch    Person(s) Educated Patient    Methods Explanation;Demonstration;Verbal cues    Comprehension Verbalized understanding;Returned demonstration;Verbal cues required            OT Short Term Goals - 09/26/20 0925      OT SHORT TERM GOAL #1   Title I with inital HEP.    Time 4    Period Weeks    Status Achieved      OT SHORT TERM GOAL #2   Title I with splint wear, care and precautions    Time 4    Period Weeks    Status Achieved      OT SHORT TERM GOAL #3   Title Pt will increase L wrist flexion to 40* for increased functional use.    Time 4    Period Weeks    Status Achieved   55*     OT SHORT TERM GOAL #4   Title Pt will demonstrate ability to oppose all digits in prep for functional use of LUE.    Time 4    Period Weeks    Status On-going   difficulty opposing 5th digit     OT SHORT TERM GOAL #5   Title Pt will demonstrate full composite finger flexion in prep for ADLs.    Baseline 95%    Time 8    Period Weeks    Status Achieved      OT SHORT TERM GOAL #6   Title I with edema control and scar massage    Time 4    Period Weeks    Status Achieved   Pt has been educate in in scar massage            OT Long Term Goals - 10/08/20 [id number]      OT LONG TERM GOAL #1   Title I with updated HEP.    Time 8    Period Weeks    Status Achieved      OT LONG TERM GOAL #2   Title Pt will demonstrate grip strength of at least 30 lbs for ADL performance. revised-Pt will increase LUE grip strength to 52 lbs or greater.    Time  8    Period Weeks    Status Revised   46.7- inital goal     OT LONG TERM GOAL #3    Title Pt will resume use of LUE as a non dominant assist at least 90% of the time with pain less than or equal to 3/10.    Time 8    Period Weeks    Status On-going   80-90%     OT LONG TERM GOAL #4   Title Pt will demonstrate adequate 3 pt pinch strength in order  to place and remove an 8 lbs clip from antennae.    Baseline not tested due to precautions    Time 8    Period Weeks    Status On-going                  Patient will benefit from skilled therapeutic intervention in order to improve the following deficits and impairments:           Visit Diagnosis: Stiffness of left hand, not elsewhere classified  Muscle weakness (generalized)  Localized edema  Pain in left hand  Stiffness of left knee, not elsewhere classified    Problem List Patient Active Problem List   Diagnosis Date Noted  . Post-operative state 08/20/2020  . Nontraumatic incomplete tear of left rotator cuff 08/20/2020  . AC (acromioclavicular) arthritis 08/20/2020  . Complete tear of left rotator cuff 07/09/2020  . 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 Osteoarthritis of left AC (acromioclavicular) joint 05/07/2020  . Impingement syndrome of left shoulder 02/08/2020  . S/P TKR (total knee replacement) using cement, left 10/03/2019  . Cellulitis of arm 08/28/2019  . Essential hypertension 02/16/2018  . Sepsis due to skin infection ([place]) 02/16/2018  . Chronic pain 02/16/2018  . Cellulitis of left lower extremity   . Lumbar radiculopathy, chronic 01/24/2018  . Lumbar stenosis with neurogenic claudication 03/23/2017  . Hypothyroidism 07/25/2015  . Primary osteoarthritis of right knee 07/23/2015  . Primary osteoarthritis of knee 07/23/2015    Larry [name] 10/08/2020, 12:43 PM  [facility] [address] [place], [place], [address] Phone: [phone]   Fax:  [phone]  Name: Larry [name] MRN: [id number] Date of Birth: 1951/05/05

## 2020-10-08 NOTE — Progress Notes (Signed)
Office Visit Note   Patient: Larry [name]           Date of Birth: April 07, 1951           MRN: [id number] Visit Date: 10/08/2020              Requested by: Larry [name],  Navarino [name] PCP: Larry [name]: Visit Diagnoses:  1. Traumatic complete tear of left rotator cuff, subsequent encounter     [name]: [name] was seen last week with an acute exacerbation of left shoulder pain after his rotator cuff tear repair.  He thought that the pain was related to his physical therapy.  He has been taking it easy over the past week and relates that although his issues have resolved.  He is back doing physical therapy without any problems.  He is no longer wearing a sling.  Is able to place his arm over his head without any trouble and did not have any localized areas of tenderness.  Impingement testing was negative.  Gradually increase his therapy [name] to see him back in 2 weeks. he still unable to work  Follow-Up Instructions: Return in about 2 weeks (around 10/22/2020).   Orders:  No orders of the defined types were placed in this encounter.  No orders of the defined types were placed in this encounter.     Procedures: No procedures performed   Clinical Data: No additional findings.   Subjective: Chief Complaint  Patient presents with  . Left Shoulder - Follow-up    Left shoulder scope 08/15/2020  Patient presents today for a one week follow up on his left shoulder. He is now 8 weeks out from a left shoulder arthroscopy. He had surgery on 08/15/2020. He states that he has noticed a lot of improvement since last week. He stared back with therapy this week. He is taking tramadol if needed, and also taking his meloxicam.   HPI  Review of Systems   Objective: Vital Signs: Ht 5\' 6"  (1.676 m)   Wt 184 lb (83.5 kg)   BMI 29.70 kg/m   Physical Exam  Ortho Exam awake alert and oriented x3.   Comfortable sitting.  Localized areas of tenderness left shoulder.  Incisions of healed without problem.  Able to place his arm fully over his head slowly.  Negative impingement.  Good grip and release  Specialty Comments:  No specialty comments available.  Imaging: No results found.   PMFS History: Patient Active Problem List   Diagnosis Date Noted  . Post-operative state 08/20/2020  . Nontraumatic incomplete tear of left rotator cuff 08/20/2020  . AC (acromioclavicular) arthritis 08/20/2020  . Complete tear of left rotator cuff 07/09/2020  . Osteoarthritis of first carpometacarpal [facility]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 05/07/2020  . Osteoarthritis of left AC (acromioclavicular) joint 05/07/2020  . Impingement syndrome of left shoulder 02/08/2020  . S/P TKR (total knee replacement) using cement, left 10/03/2019  . Cellulitis of arm 08/28/2019  . Essential hypertension 02/16/2018  . Sepsis due to skin infection ([employer]) 02/16/2018  . Chronic pain 02/16/2018  . Cellulitis of left lower extremity   . Lumbar radiculopathy, chronic 01/24/2018  . Lumbar stenosis with neurogenic claudication 03/23/2017  . Hypothyroidism 07/25/2015  . Primary osteoarthritis of right knee 07/23/2015  . Primary osteoarthritis of knee 07/23/2015   Past Medical History:  Diagnosis Date  . Arthritis    "knees; left shoulder" (04/12/2013)  .  Asthma    "as a child" (04/12/2013)  . DDD (degenerative disc disease), cervical   . DDD (degenerative disc disease), lumbar   . Difficult intubation    2012   CERVICAL FUSION   . Headache(784.0)    VERAPAMIL FOR PREVENTION   . 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 History of chicken pox    as an adult  . Hypoglycemia    last episode 1 yr. ago, just gets jittery  . Hypothyroidism   . Lumbar stenosis   . MRSA (methicillin resistant staph aureus) culture positive 09/10/2019   upper arm   . OSA (obstructive sleep apnea)    "haven't wore mask in years; had OR; still snore" (04/12/2013)  . Urinary frequency     History reviewed. No pertinent family history.  Past Surgical History:  Procedure Laterality Date  . BACK SURGERY    . CARPOMETACARPEL SUSPENSION PLASTY Left 07/19/2020   Procedure: LEFT THUMB CARPOMETACARPAL ([place]) ARTHROPLASTY;  Surgeon: Leandrew Koyanagi, [name];  Location: [facility];  Service: Orthopedics;  Laterality: Left;  . CERVICAL FUSION  2002; 2012  . CYSTECTOMY    . INGUINAL HERNIA REPAIR Bilateral 1997  . KNEE ARTHROSCOPY Left ~ 1997; ~ 2005  . LUMBAR FUSION  2007; 04/11/2013  . PENILE DEBRIDEMENT  ~ 2011   X 3  FOR MRSA INFECTION    . SHOULDER ARTHROSCOPY Left ~ 2003  . TONSILLECTOMY  1990's  . TOTAL KNEE ARTHROPLASTY Right 07/23/2015  . TOTAL KNEE ARTHROPLASTY Right 07/23/2015   Procedure: TOTAL KNEE ARTHROPLASTY;  Surgeon: Garald Balding, [name];  Location: [facility];  Service: Orthopedics;  Laterality: Right;  . TOTAL KNEE ARTHROPLASTY Left 10/03/2019   Procedure: LEFT TOTAL KNEE ARTHROPLASTY;  Surgeon: Garald Balding, [name];  [facility];  Service: Orthopedics;  Laterality: Left;  . UVULOPALATOPHARYNGOPLASTY (UPPP)/TONSILLECTOMY/SEPTOPLASTY  1990's  . VASECTOMY     Social History   Occupational History  . Not on file  Tobacco Use  . Smoking status: Former Smoker    Packs/day: 0.50    Years: 15.00    Pack years: 7.50    Types: Cigarettes    Quit date: 10/14/1986    Years since quitting: 34.0  . Smokeless tobacco: Never Used  Vaping Use  . Vaping Use: Never used  Substance and Sexual Activity  . Alcohol use: Yes    Comment: occasional  . Drug use: No  . Sexual activity: Yes

## 2020-10-10 ENCOUNTER — Encounter: Payer: Self-pay | Admitting: Occupational Therapy

## 2020-10-10 ENCOUNTER — Other Ambulatory Visit: Payer: Self-pay

## 2020-10-10 ENCOUNTER — Ambulatory Visit: Payer: Medicare Other | Admitting: Occupational Therapy

## 2020-10-10 DIAGNOSIS — M25642 Stiffness of left hand, not elsewhere classified: Secondary | ICD-10-CM | POA: Diagnosis not present

## 2020-10-10 DIAGNOSIS — R600 Localized edema: Secondary | ICD-10-CM

## 2020-10-10 DIAGNOSIS — M6281 Muscle weakness (generalized): Secondary | ICD-10-CM

## 2020-10-10 DIAGNOSIS — M79642 Pain in left hand: Secondary | ICD-10-CM | POA: Diagnosis not present

## 2020-10-10 DIAGNOSIS — M25662 Stiffness of left knee, not elsewhere classified: Secondary | ICD-10-CM | POA: Diagnosis not present

## 2020-10-10 NOTE — Therapy (Signed)
[facility] [address] [place], [place], [address] Phone: [phone]   Fax:  [phone]  Occupational Therapy Treatment  Patient Details  Name: Larry [name] MRN: [id number] Date of Birth: 06-21-1951 Referring Provider (OT): Dr. Erlinda Hong   Encounter Date: 10/10/2020   OT End of Session - 10/10/20 0857    Visit Number 10    Number of Visits 17    Date for OT Re-Evaluation 10/25/20    Authorization Type Medicare    Authorization Time Period cert for 90 days    Authorization - Visit Number 10    Authorization - Number of Visits 10    Activity Tolerance Patient tolerated treatment well    Behavior During Therapy [facility] for tasks assessed/performed           Past Medical History:  Diagnosis Date  . Arthritis    "knees; left shoulder" (04/12/2013)  . Asthma    "as a child" (04/12/2013)  . DDD (degenerative disc disease), cervical   . DDD (degenerative disc disease), lumbar   . Difficult intubation    2012   CERVICAL FUSION   . Headache(784.0)    VERAPAMIL FOR PREVENTION   . 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 History of chicken pox    as an adult  . Hypoglycemia    last episode 1 yr. ago, just gets jittery  . Hypothyroidism   . Lumbar stenosis   . MRSA (methicillin resistant staph aureus) culture positive 09/10/2019   upper arm  . OSA (obstructive sleep apnea)    "haven't wore mask in years; had OR; still snore" (04/12/2013)  . Urinary frequency     Past Surgical History:  Procedure Laterality Date  . BACK SURGERY    . CARPOMETACARPEL SUSPENSION PLASTY Left 07/19/2020   Procedure: LEFT THUMB CARPOMETACARPAL ([place]) ARTHROPLASTY;  Surgeon: Leandrew Koyanagi, MD;  Location: [facility];  Service: Orthopedics;  Laterality: Left;  . CERVICAL FUSION  2002; 2012  . CYSTECTOMY    . INGUINAL HERNIA REPAIR  Bilateral 1997  . KNEE ARTHROSCOPY Left ~ 1997; ~ 2005  . LUMBAR FUSION  2007; 04/11/2013  . PENILE DEBRIDEMENT  ~ 2011   X 3  FOR MRSA INFECTION    . SHOULDER ARTHROSCOPY Left ~ 2003  . TONSILLECTOMY  1990's  . TOTAL KNEE ARTHROPLASTY Right 07/23/2015  . TOTAL KNEE ARTHROPLASTY Right 07/23/2015   Procedure: TOTAL KNEE ARTHROPLASTY;  Surgeon: Garald Balding, MD;  Location: [facility];  Service: Orthopedics;  Laterality: Right;  . TOTAL KNEE ARTHROPLASTY Left 10/03/2019   Procedure: LEFT TOTAL KNEE ARTHROPLASTY;  Surgeon: Garald Balding, MD;  [facility];  Service: Orthopedics;  Laterality: Left;  . UVULOPALATOPHARYNGOPLASTY (UPPP)/TONSILLECTOMY/SEPTOPLASTY  1990's  . VASECTOMY      There were no vitals filed for this visit.   Subjective Assessment - 10/10/20 0856    Subjective  Pt reports no pain in hand today    Pertinent History s/p Left thumb carpometacarpal interposition arthroplasty, thumb  Left carpometacarpal reconstruction with tendon graft 07/19/20.      Pt is hypothyroidism, OA, asthma, back pain s/p lumbar fusion and cervical fusion, recent rotator cuff repair by Dr. Durward Fortes - PT is addressing.    Patient Stated Goals regain use of hand    Currently in Pain? No/denies  Treatment: Fluidotherapy x 10 mins to LUE for pain and stiffness, no adverse reactions. A/ROM thumb flexion,  opposition,followed by gentle P/ROM MP thumb flexion. Tendon gliding exercises x 10 reps, finger abduction/ adduction x 10 reps each, min v.c for all exercises. Review Gripper set at level 2 for sustained grip, to pick up the blocks, min difficulty/ v.c               OT Short Term Goals - 10/10/20 0913      OT SHORT TERM GOAL #1   Title I with inital HEP.    Time 4    Period Weeks    Status Achieved      OT SHORT TERM GOAL #2   Title I with splint wear, care and precautions    Time 4    Period Weeks    Status Achieved      OT SHORT TERM GOAL  #3   Title Pt will increase L wrist flexion to 40* for increased functional use.    Time 4    Period Weeks    Status Achieved   55*     OT SHORT TERM GOAL #4   Title Pt will demonstrate ability to oppose all digits in prep for functional use of LUE.    Time 4    Period Weeks    Status Partially Met   able to oppose side of 5th digit, however this is how other hand appears     OT SHORT TERM GOAL #5   Title Pt will demonstrate full composite finger flexion in prep for ADLs.    Baseline 95%    Time 8    Period Weeks    Status Achieved      OT SHORT TERM GOAL #6   Title I with edema control and scar massage    Time 4    Period Weeks    Status Achieved   Pt has been educate in in scar massage            OT Long Term Goals - 10/10/20 0909      OT LONG TERM GOAL #1   Title I with updated HEP.    Time 8    Period Weeks    Status Achieved      OT LONG TERM GOAL #2   Title Pt will demonstrate grip strength of at least 30 lbs for ADL performance. revised-Pt will increase LUE grip strength to 52 lbs or greater.    Time 8    Period Weeks    Status Achieved   54.6     OT LONG TERM GOAL #3   Title Pt will resume use of LUE as a non dominant assist at least 90% of the time with pain less than or equal to 3/10.    Time 8    Period Weeks    Status Not Met   75%- not fully met due to recent shoulder shoulder     OT LONG TERM GOAL #4   Title Pt will demonstrate adequate 3 pt pinch strength in order  to place and remove an 8 lbs clip from antennae.    Baseline not tested due to precautions    Time 8    Period Weeks    Status Achieved                  Patient will benefit from skilled therapeutic intervention in order to improve the following deficits and impairments:  Visit Diagnosis: Stiffness of left hand, not elsewhere classified  Muscle weakness (generalized)  Localized edema  Pain in left hand    Problem List Patient Active Problem List    Diagnosis Date Noted  . Post-operative state 08/20/2020  . Nontraumatic incomplete tear of left rotator cuff 08/20/2020  . AC (acromioclavicular) arthritis 08/20/2020  . Complete tear of left rotator cuff 07/09/2020  . Osteoarthritis of first carpometacarpal [facility]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 05/07/2020  . Osteoarthritis of left AC (acromioclavicular) joint 05/07/2020  . Impingement syndrome of left shoulder 02/08/2020  . S/P TKR (total knee replacement) using cement, left 10/03/2019  . Cellulitis of arm 08/28/2019  . Essential hypertension 02/16/2018  . Sepsis due to skin infection ([place]) 02/16/2018  . Chronic pain 02/16/2018  . Cellulitis of left lower extremity   . Lumbar radiculopathy, chronic 01/24/2018  . Lumbar stenosis with neurogenic claudication 03/23/2017  . Hypothyroidism 07/25/2015  . Primary osteoarthritis of right knee 07/23/2015  . Primary osteoarthritis of knee 07/23/2015    Adyson Vanburen 10/10/2020, 9:26 AM  [facility] [address] [place], [place], [address] Phone: [phone]   Fax:  [phone]  Name: Larry [name] MRN: [id number] Date of Birth: Jun 20, 1951

## 2020-10-15 ENCOUNTER — Ambulatory Visit: Payer: Medicare Other | Admitting: Occupational Therapy

## 2020-10-16 ENCOUNTER — Other Ambulatory Visit: Payer: Self-pay

## 2020-10-16 ENCOUNTER — Encounter: Payer: Self-pay | Admitting: Orthopaedic Surgery

## 2020-10-16 DIAGNOSIS — S46012D Strain of muscle(s) and tendon(s) of the rotator cuff of left shoulder, subsequent encounter: Secondary | ICD-10-CM

## 2020-10-17 ENCOUNTER — Ambulatory Visit: Payer: Medicare Other | Admitting: Occupational Therapy

## 2020-10-17 DIAGNOSIS — N401 Enlarged prostate with lower urinary tract symptoms: Secondary | ICD-10-CM | POA: Diagnosis not present

## 2020-10-17 DIAGNOSIS — E039 Hypothyroidism, unspecified: Secondary | ICD-10-CM | POA: Diagnosis not present

## 2020-10-17 DIAGNOSIS — Z125 Encounter for screening for malignant neoplasm of prostate: Secondary | ICD-10-CM | POA: Diagnosis not present

## 2020-10-17 DIAGNOSIS — Z79899 Other long term (current) drug therapy: Secondary | ICD-10-CM | POA: Diagnosis not present

## 2020-10-17 DIAGNOSIS — I10 Essential (primary) hypertension: Secondary | ICD-10-CM | POA: Diagnosis not present

## 2020-10-17 DIAGNOSIS — G8929 Other chronic pain: Secondary | ICD-10-CM | POA: Diagnosis not present

## 2020-10-17 DIAGNOSIS — E291 Testicular hypofunction: Secondary | ICD-10-CM | POA: Diagnosis not present

## 2020-10-18 ENCOUNTER — Telehealth: Payer: Self-pay | Admitting: *Deleted

## 2020-10-18 NOTE — Telephone Encounter (Signed)
1 year Post op Ortho bundle call completed.

## 2020-10-22 ENCOUNTER — Encounter: Payer: Self-pay | Admitting: Orthopaedic Surgery

## 2020-10-22 ENCOUNTER — Encounter: Payer: Medicare Other | Admitting: Occupational Therapy

## 2020-10-22 ENCOUNTER — Ambulatory Visit (INDEPENDENT_AMBULATORY_CARE_PROVIDER_SITE_OTHER): Payer: No Typology Code available for payment source | Admitting: Orthopaedic Surgery

## 2020-10-22 ENCOUNTER — Other Ambulatory Visit: Payer: Self-pay

## 2020-10-22 DIAGNOSIS — S46012D Strain of muscle(s) and tendon(s) of the rotator cuff of left shoulder, subsequent encounter: Secondary | ICD-10-CM

## 2020-10-22 NOTE — Progress Notes (Signed)
Office Visit Note   Patient: Larry [name]           Date of Birth: [birth year]           MRN: [id number] Visit Date: 10/22/2020              Requested by: Merrilee Seashore, [address],  [place] [address] PCP: Merrilee Seashore, MD   Assessment & Plan: Visit Diagnoses:  1. Traumatic complete tear of left rotator cuff, subsequent encounter     Plan: 9 weeks status post rotator cuff tear repair of left shoulder doing quite well.  Continues to be followed in physical therapy.  Able to place his arm over his head and not wearing the sling.  Rarely uses any medicine.  He like to return to work on February 14.  I would like him to check with his Workmen's Comp. case manager regarding full-time or part-time for several weeks and how we may or may not be compensated.  He had a lot of questions and I could not answer.  So we will hold off on giving him a note until he can give [place] the details.  I plan to see him back in a month  Follow-Up Instructions: Return in about 1 month (around 11/22/2020).   Orders:  No orders of the defined types were placed in this encounter.  No orders of the defined types were placed in this encounter.     Procedures: No procedures performed   Clinical Data: No additional findings.   Subjective: Chief Complaint  Patient presents with  . Left Shoulder - Routine Post Op   No related problems 9-week status post rotator cuff tear repair left shoulder.  Denies shortness of breath chest pain fever or chills.  Not wearing a sling.  Happy with his progress.  Considering back to work sometime in mid February HPI  Review of Systems   Objective: Vital Signs: There were no vitals taken for this visit.  Physical Exam  Ortho Exam incisions left shoulder healed without a problem.  Able to place his left arm just about fully overhead and quickly has about 90 degrees of abduction.  Incisions are fine good strength.  Minimal  discomfort over the incisions  Specialty Comments:  No specialty comments available.  Imaging: No results found.   PMFS History: Patient Active Problem List   Diagnosis Date Noted  . Post-operative state 08/20/2020  . Nontraumatic incomplete tear of left rotator cuff 08/20/2020  . AC (acromioclavicular) arthritis 08/20/2020  . Complete tear of left rotator cuff 07/09/2020  . Osteoarthritis of first carpometacarpal [facility]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 05/07/2020  . Osteoarthritis of left AC (acromioclavicular) joint 05/07/2020  . Impingement syndrome of left shoulder 02/08/2020  . S/P TKR (total knee replacement) using cement, left 10/03/2019  . Cellulitis of arm 08/28/2019  . Essential hypertension 02/16/2018  . Sepsis due to skin infection ([place]) 02/16/2018  . Chronic pain 02/16/2018  . Cellulitis of left lower extremity   . Lumbar radiculopathy, chronic 01/24/2018  . Lumbar stenosis with neurogenic claudication 03/23/2017  . Hypothyroidism 07/25/2015  . Primary osteoarthritis of right knee 07/23/2015  . Primary osteoarthritis of knee 07/23/2015   Past Medical History:  Diagnosis Date  . Arthritis    "knees; left shoulder" (04/12/2013)  . Asthma    "as a child" (04/12/2013)  . DDD (degenerative disc disease), cervical   . DDD (degenerative disc disease), lumbar   . Difficult intubation  2012   CERVICAL FUSION   . Headache(784.0)    VERAPAMIL FOR PREVENTION   . 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 History of chicken pox    as an adult  . Hypoglycemia    last episode 1 yr. ago, just gets jittery  . Hypothyroidism   . Lumbar stenosis   . MRSA (methicillin resistant staph aureus) culture positive 09/10/2019   upper arm  . OSA (obstructive sleep apnea)    "haven't wore mask in years; had OR; still snore" (04/12/2013)  . Urinary frequency     History reviewed. No pertinent family history.   Past Surgical History:  Procedure Laterality Date  . BACK SURGERY    . CARPOMETACARPEL SUSPENSION PLASTY Left 07/19/2020   Procedure: LEFT THUMB CARPOMETACARPAL ([place]) ARTHROPLASTY;  Surgeon: Leandrew Koyanagi, MD;  Location: [place];  Service: Orthopedics;  Laterality: Left;  . CERVICAL FUSION  2002; 2012  . CYSTECTOMY    . INGUINAL HERNIA REPAIR Bilateral 1997  . KNEE ARTHROSCOPY Left ~ 1997; ~ 2005  . LUMBAR FUSION  2007; 04/11/2013  . PENILE DEBRIDEMENT  ~ 2011   X 3  FOR MRSA INFECTION    . SHOULDER ARTHROSCOPY Left ~ 2003  . TONSILLECTOMY  1990's  . TOTAL KNEE ARTHROPLASTY Right 07/23/2015  . TOTAL KNEE ARTHROPLASTY Right 07/23/2015   Procedure: TOTAL KNEE ARTHROPLASTY;  Surgeon: Garald Balding, MD;  Location: [place] [facility];  Service: Orthopedics;  Laterality: Right;  . TOTAL KNEE ARTHROPLASTY Left 10/03/2019   Procedure: LEFT TOTAL KNEE ARTHROPLASTY;  Surgeon: Garald Balding, MD;  Location: [facility];  Service: Orthopedics;  Laterality: Left;  . UVULOPALATOPHARYNGOPLASTY (UPPP)/TONSILLECTOMY/SEPTOPLASTY  1990's  . VASECTOMY     Social History   Occupational History  . Not on file  Tobacco Use  . Smoking status: Former Smoker    Packs/day: 0.50    Years: 15.00    Pack years: 7.50    Types: Cigarettes    Quit date: 10/14/1986    Years since quitting: 34.0  . Smokeless tobacco: Never Used  Vaping Use  . Vaping Use: Never used  Substance and Sexual Activity  . Alcohol use: Yes    Comment: occasional  . Drug use: No  . Sexual activity: Yes     Garald Balding, MD   Note - This record has been created using [employer].  Chart creation errors have been sought, but may not always  have been located. Such creation errors do not reflect on  the standard of medical care.

## 2020-10-23 DIAGNOSIS — E291 Testicular hypofunction: Secondary | ICD-10-CM | POA: Diagnosis not present

## 2020-10-23 DIAGNOSIS — N401 Enlarged prostate with lower urinary tract symptoms: Secondary | ICD-10-CM | POA: Diagnosis not present

## 2020-10-23 DIAGNOSIS — Z0000 Encounter for general adult medical examination without abnormal findings: Secondary | ICD-10-CM | POA: Diagnosis not present

## 2020-10-23 DIAGNOSIS — E039 Hypothyroidism, unspecified: Secondary | ICD-10-CM | POA: Diagnosis not present

## 2020-10-23 DIAGNOSIS — N529 Male erectile dysfunction, unspecified: Secondary | ICD-10-CM | POA: Diagnosis not present

## 2020-10-23 DIAGNOSIS — R3129 Other microscopic hematuria: Secondary | ICD-10-CM | POA: Diagnosis not present

## 2020-10-23 DIAGNOSIS — R7303 Prediabetes: Secondary | ICD-10-CM | POA: Diagnosis not present

## 2020-10-23 DIAGNOSIS — I10 Essential (primary) hypertension: Secondary | ICD-10-CM | POA: Diagnosis not present

## 2020-10-23 DIAGNOSIS — Z79899 Other long term (current) drug therapy: Secondary | ICD-10-CM | POA: Diagnosis not present

## 2020-10-23 DIAGNOSIS — M150 Primary generalized (osteo)arthritis: Secondary | ICD-10-CM | POA: Diagnosis not present

## 2020-10-23 DIAGNOSIS — R5383 Other fatigue: Secondary | ICD-10-CM | POA: Diagnosis not present

## 2020-10-24 ENCOUNTER — Encounter: Payer: Self-pay | Admitting: Orthopaedic Surgery

## 2020-10-24 ENCOUNTER — Other Ambulatory Visit: Payer: Self-pay | Admitting: Radiology

## 2020-10-24 ENCOUNTER — Encounter: Payer: Medicare Other | Admitting: Occupational Therapy

## 2020-10-24 ENCOUNTER — Telehealth: Payer: Self-pay | Admitting: Orthopaedic Surgery

## 2020-10-24 MED ORDER — AMOXICILLIN 500 MG PO TABS: ORAL_TABLET | ORAL | 0 refills | Status: DC

## 2020-10-24 NOTE — Telephone Encounter (Signed)
Patient has also contacted by My Chart.  Antibiotic has been sent in. Patient advised per My Chart.

## 2020-10-24 NOTE — Telephone Encounter (Signed)
Pt called stating he has a dentist appt at 4 today and he wanted to know if he'll need an antibiotic? Pt would like a CB   [phone]

## 2020-10-28 ENCOUNTER — Encounter: Payer: Self-pay | Admitting: Orthopaedic Surgery

## 2020-10-29 ENCOUNTER — Encounter: Payer: Self-pay | Admitting: Orthopaedic Surgery

## 2020-10-29 ENCOUNTER — Other Ambulatory Visit: Payer: Self-pay

## 2020-10-29 DIAGNOSIS — S46012D Strain of muscle(s) and tendon(s) of the rotator cuff of left shoulder, subsequent encounter: Secondary | ICD-10-CM

## 2020-10-29 MED ORDER — AMOXICILLIN 500 MG PO TABS: ORAL_TABLET | ORAL | 0 refills | Status: DC

## 2020-11-06 DIAGNOSIS — G5601 Carpal tunnel syndrome, right upper limb: Secondary | ICD-10-CM | POA: Diagnosis not present

## 2020-11-06 DIAGNOSIS — M545 Low back pain, unspecified: Secondary | ICD-10-CM | POA: Diagnosis not present

## 2020-11-06 DIAGNOSIS — R201 Hypoesthesia of skin: Secondary | ICD-10-CM | POA: Diagnosis not present

## 2020-11-06 DIAGNOSIS — M542 Cervicalgia: Secondary | ICD-10-CM | POA: Diagnosis not present

## 2020-11-06 DIAGNOSIS — R202 Paresthesia of skin: Secondary | ICD-10-CM | POA: Diagnosis not present

## 2020-11-12 ENCOUNTER — Encounter: Payer: Self-pay | Admitting: Orthopaedic Surgery

## 2020-11-12 ENCOUNTER — Ambulatory Visit (INDEPENDENT_AMBULATORY_CARE_PROVIDER_SITE_OTHER): Payer: Medicare Other | Admitting: Orthopaedic Surgery

## 2020-11-12 DIAGNOSIS — S63055A Dislocation of other carpometacarpal joint of left hand, initial encounter: Secondary | ICD-10-CM

## 2020-11-12 NOTE — Progress Notes (Signed)
Post-Op Visit Note   Patient: Larry [name]           Date of Birth: [birth year]           MRN: [id number] Visit Date: 11/12/2020 PCP: Merrilee Seashore, MD   Assessment & Plan:  Chief Complaint:  Chief Complaint  Patient presents with  . Left Hand - Routine Post Op   Visit Diagnoses:  1. CMC (carpometacarpal joint) dislocation, left, initial encounter     Plan:   [name] is about 4 months status post left thumb CMC arthroplasty.  He has returned back to work.  He has no complaints or concerns.  He continues to recover from rotator cuff surgery.  His left hand shows a fully healed surgical scar.  He has good range of motion and opposition of the thumb.  Adequate grip strength.  From my standpoint he is doing very well and he is very pleased with his progress and outcome.  At this point we will release him.  Follow-up as needed.  Follow-Up Instructions: Return if symptoms worsen or fail to improve.   Orders:  No orders of the defined types were placed in this encounter.  No orders of the defined types were placed in this encounter.   Imaging: No results found.  PMFS History: Patient Active Problem List   Diagnosis Date Noted  . CMC (carpometacarpal joint) dislocation, left, initial encounter 11/12/2020  . Post-operative state 08/20/2020  . Nontraumatic incomplete tear of left rotator cuff 08/20/2020  . AC (acromioclavicular) arthritis 08/20/2020  . Complete tear of left rotator cuff 07/09/2020  . Osteoarthritis of first carpometacarpal [facility]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 05/07/2020  . Osteoarthritis of left AC (acromioclavicular) joint 05/07/2020  . Impingement syndrome of left shoulder 02/08/2020  . S/P TKR (total knee replacement) using cement, left 10/03/2019  . Cellulitis of arm 08/28/2019  . Essential hypertension 02/16/2018  . Sepsis due to skin infection ([place]) 02/16/2018  . Chronic pain 02/16/2018  . Cellulitis of left lower extremity   . Lumbar radiculopathy,  chronic 01/24/2018  . Lumbar stenosis with neurogenic claudication 03/23/2017  . Hypothyroidism 07/25/2015  . Primary osteoarthritis of right knee 07/23/2015  . Primary osteoarthritis of knee 07/23/2015   Past Medical History:  Diagnosis Date  . Arthritis    "knees; left shoulder" (04/12/2013)  . Asthma    "as a child" (04/12/2013)  . DDD (degenerative disc disease), cervical   . DDD (degenerative disc disease), lumbar   . Difficult intubation    2012   CERVICAL FUSION   . Headache(784.0)    VERAPAMIL FOR PREVENTION   . 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 History of chicken pox    as an adult  . Hypoglycemia    last episode 1 yr. ago, just gets jittery  . Hypothyroidism   . Lumbar stenosis   . MRSA (methicillin resistant staph aureus) culture positive 09/10/2019   upper arm  . OSA (obstructive sleep apnea)    "haven't wore mask in years; had OR; still snore" (04/12/2013)  . Urinary frequency     History reviewed. No pertinent family history.  Past Surgical History:  Procedure Laterality Date  . BACK SURGERY    . CARPOMETACARPEL SUSPENSION PLASTY Left 07/19/2020   Procedure: LEFT THUMB CARPOMETACARPAL ([place]) ARTHROPLASTY;  Surgeon: Leandrew Koyanagi, MD;  Location: [place];  Service: Orthopedics;  Laterality: Left;  . CERVICAL FUSION  2002; 2012  . CYSTECTOMY    . INGUINAL HERNIA REPAIR Bilateral 1997  . KNEE ARTHROSCOPY Left ~ 1997; ~ 2005  . LUMBAR FUSION  2007; 04/11/2013  . PENILE DEBRIDEMENT  ~ 2011   X 3  FOR MRSA INFECTION    . SHOULDER ARTHROSCOPY Left ~ 2003  . TONSILLECTOMY  1990's  . TOTAL KNEE ARTHROPLASTY Right 07/23/2015  . TOTAL KNEE ARTHROPLASTY Right 07/23/2015   Procedure: TOTAL KNEE ARTHROPLASTY;  Surgeon: Garald Balding, MD;  Location: [facility];  Service: Orthopedics;  Laterality: Right;  . TOTAL KNEE ARTHROPLASTY Left 10/03/2019   Procedure: LEFT  TOTAL KNEE ARTHROPLASTY;  Surgeon: Garald Balding, MD;  [facility];  Service: Orthopedics;  Laterality: Left;  . UVULOPALATOPHARYNGOPLASTY (UPPP)/TONSILLECTOMY/SEPTOPLASTY  1990's  . VASECTOMY     Social History   Occupational History  . Not on file  Tobacco Use  . Smoking status: Former Smoker    Packs/day: 0.50    Years: 15.00    Pack years: 7.50    Types: Cigarettes    Quit date: 10/14/1986    Years since quitting: 34.1  . Smokeless tobacco: Never Used  Vaping Use  . Vaping Use: Never used  Substance and Sexual Activity  . Alcohol use: Yes    Comment: occasional  . Drug use: No  . Sexual activity: Yes

## 2020-11-13 ENCOUNTER — Ambulatory Visit: Payer: Medicare Other | Admitting: Orthopaedic Surgery

## 2020-11-25 ENCOUNTER — Telehealth: Payer: Self-pay

## 2020-11-25 NOTE — Telephone Encounter (Signed)
Larry [name] at [facility] would like to know if lifting limits could be increased for left shoulder?  Cb# [phone].  Please advise.  Thank you.

## 2020-11-25 NOTE — Telephone Encounter (Signed)
Please advise 

## 2020-11-26 ENCOUNTER — Ambulatory Visit: Payer: Medicare Other | Admitting: Orthopaedic Surgery

## 2020-11-26 ENCOUNTER — Other Ambulatory Visit: Payer: Self-pay

## 2020-11-26 DIAGNOSIS — S46012D Strain of muscle(s) and tendon(s) of the rotator cuff of left shoulder, subsequent encounter: Secondary | ICD-10-CM

## 2020-11-26 NOTE — Telephone Encounter (Signed)
Called and spoke with Santiago Glad. Relayed information from Dr.Whitfield. She has requested a new script be added for Workers Comp. I will add this to his chart.

## 2020-11-26 NOTE — Telephone Encounter (Signed)
New ordered placed for PT at [facility]  Patient is Workers Comp.  Thanks!

## 2020-11-26 NOTE — Telephone Encounter (Signed)
Ok to increase lifting limits as tolerated

## 2020-11-28 ENCOUNTER — Telehealth: Payer: Self-pay

## 2020-11-28 NOTE — Telephone Encounter (Signed)
Santiago Glad with [facility] would like documentation faxed to [phone] concerning approval for continued PT from adjustor.  CB# [phone].  Please advise.  Thank you.

## 2020-12-05 ENCOUNTER — Ambulatory Visit (INDEPENDENT_AMBULATORY_CARE_PROVIDER_SITE_OTHER): Payer: Medicare Other | Admitting: Orthopaedic Surgery

## 2020-12-05 ENCOUNTER — Encounter: Payer: Self-pay | Admitting: Orthopaedic Surgery

## 2020-12-05 ENCOUNTER — Other Ambulatory Visit: Payer: Self-pay

## 2020-12-05 VITALS — Ht 66.0 in | Wt 184.0 lb

## 2020-12-05 DIAGNOSIS — S46012D Strain of muscle(s) and tendon(s) of the rotator cuff of left shoulder, subsequent encounter: Secondary | ICD-10-CM

## 2020-12-05 NOTE — Progress Notes (Signed)
Office Visit Note   Patient: Larry [name]           Date of Birth: 02-18-1951           MRN: [id number] Visit Date: 12/05/2020              Requested by: Merrilee Seashore, [address] [place],  [place] [address] PCP: Merrilee Seashore, MD   Assessment & Plan: Visit Diagnoses:  1. Traumatic complete tear of left rotator cuff, subsequent encounter     Plan: Approaching 4 months status post rotator cuff tear repair left shoulder.  Has been working for 3 weeks with restrictions of no more than 20 pounds lifting overhead.  We will continue with those restrictions for another 6 weeks at which point I will reevaluate his status.  He is finished therapy and has been performing home exercises and doing quite well.  No longer has any the preoperative pain  Follow-Up Instructions: Return in about 6 weeks (around 01/16/2021).   Orders:  No orders of the defined types were placed in this encounter.  No orders of the defined types were placed in this encounter.     Procedures: No procedures performed   Clinical Data: No additional findings.   Subjective: Chief Complaint  Patient presents with  . Left Shoulder - Follow-up  Patient presents today for follow up on his left shoulder. He had a left shoulder arthroscopy on 08/15/2020. He is now almost 31months out from surgery. He just finished physical therapy. He is doing great. No complaints or pain. 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   HPI  Review of Systems   Objective: Vital Signs: Ht 5\' 6"  (1.676 m)   Wt 184 lb (83.5 kg)   BMI 29.70 kg/m   Physical Exam Constitutional:      Appearance: He is well-developed.  Eyes:     Pupils: Pupils are equal, round, and reactive to light.  Pulmonary:     Effort: Pulmonary effort is normal.  Skin:    General: Skin is warm and dry.  Neurological:     Mental Status: He is alert and oriented to person, place, and time.  Psychiatric:        Behavior: Behavior normal.      Ortho Exam awake alert and oriented x3.  Comfortable sitting.  Incisions left shoulder healed without problem.  He can quickly raise his left arm over his head.  No impingement or empty can testing.  Good grip and release.  No localized areas of tenderness.  Appears to have good strength Specialty Comments:  No specialty comments available.  Imaging: No results found.   PMFS History: Patient Active Problem List   Diagnosis Date Noted  . CMC (carpometacarpal joint) dislocation, left, initial encounter 11/12/2020  . Post-operative state 08/20/2020  . Nontraumatic incomplete tear of left rotator cuff 08/20/2020  . AC (acromioclavicular) arthritis 08/20/2020  . Complete tear of left rotator cuff 07/09/2020  . Osteoarthritis of first carpometacarpal [facility]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 05/07/2020  . Osteoarthritis of left AC (acromioclavicular) joint 05/07/2020  . Impingement syndrome of left shoulder 02/08/2020  . S/P TKR (total knee replacement) using cement, left 10/03/2019  . Cellulitis of arm 08/28/2019  . Essential hypertension 02/16/2018  . Sepsis due to skin infection ([place]) 02/16/2018  . Chronic pain 02/16/2018  . Cellulitis of left lower extremity   . Lumbar radiculopathy, chronic 01/24/2018  . Lumbar stenosis with neurogenic claudication 03/23/2017  . Hypothyroidism 07/25/2015  . Primary  osteoarthritis of right knee 07/23/2015  . Primary osteoarthritis of knee 07/23/2015   Past Medical History:  Diagnosis Date  . Arthritis    "knees; left shoulder" (04/12/2013)  . Asthma    "as a child" (04/12/2013)  . DDD (degenerative disc disease), cervical   . DDD (degenerative disc disease), lumbar   . Difficult intubation    2012   CERVICAL FUSION   . Headache(784.0)    VERAPAMIL FOR PREVENTION   . 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 History of chicken pox    as an adult  . Hypoglycemia     last episode 1 yr. ago, just gets jittery  . Hypothyroidism   . Lumbar stenosis   . MRSA (methicillin resistant staph aureus) culture positive 09/10/2019   upper arm  . OSA (obstructive sleep apnea)    "haven't wore mask in years; had OR; still snore" (04/12/2013)  . Urinary frequency     History reviewed. No pertinent family history.  Past Surgical History:  Procedure Laterality Date  . BACK SURGERY    . CARPOMETACARPEL SUSPENSION PLASTY Left 07/19/2020   Procedure: LEFT THUMB CARPOMETACARPAL ([place]) ARTHROPLASTY;  Surgeon: Leandrew Koyanagi, MD;  Location: [place];  Service: Orthopedics;  Laterality: Left;  . CERVICAL FUSION  2002; 2012  . CYSTECTOMY    . INGUINAL HERNIA REPAIR Bilateral 1997  . KNEE ARTHROSCOPY Left ~ 1997; ~ 2005  . LUMBAR FUSION  2007; 04/11/2013  . PENILE DEBRIDEMENT  ~ 2011   X 3  FOR MRSA INFECTION    . SHOULDER ARTHROSCOPY Left ~ 2003  . TONSILLECTOMY  1990's  . TOTAL KNEE ARTHROPLASTY Right 07/23/2015  . TOTAL KNEE ARTHROPLASTY Right 07/23/2015   Procedure: TOTAL KNEE ARTHROPLASTY;  Surgeon: Garald Balding, MD;  Location: [facility];  Service: Orthopedics;  Laterality: Right;  . TOTAL KNEE ARTHROPLASTY Left 10/03/2019   Procedure: LEFT TOTAL KNEE ARTHROPLASTY;  Surgeon: Garald Balding, MD;  [facility];  Service: Orthopedics;  Laterality: Left;  . UVULOPALATOPHARYNGOPLASTY (UPPP)/TONSILLECTOMY/SEPTOPLASTY  1990's  . VASECTOMY     Social History   Occupational History  . Not on file  Tobacco Use  . Smoking status: Former Smoker    Packs/day: 0.50    Years: 15.00    Pack years: 7.50    Types: Cigarettes    Quit date: 10/14/1986    Years since quitting: 34.1  . Smokeless tobacco: Never Used  Vaping Use  . Vaping Use: Never used  Substance and Sexual Activity  . Alcohol use: Yes    Comment: occasional  . Drug use: No  . Sexual activity: Yes

## 2020-12-23 DIAGNOSIS — R0981 Nasal congestion: Secondary | ICD-10-CM | POA: Diagnosis not present

## 2020-12-23 DIAGNOSIS — R490 Dysphonia: Secondary | ICD-10-CM | POA: Diagnosis not present

## 2020-12-23 DIAGNOSIS — H938X3 Other specified disorders of ear, bilateral: Secondary | ICD-10-CM | POA: Diagnosis not present

## 2021-01-13 ENCOUNTER — Other Ambulatory Visit: Payer: Self-pay

## 2021-01-13 ENCOUNTER — Ambulatory Visit (INDEPENDENT_AMBULATORY_CARE_PROVIDER_SITE_OTHER): Payer: Medicare Other | Admitting: Otolaryngology

## 2021-01-13 ENCOUNTER — Encounter (INDEPENDENT_AMBULATORY_CARE_PROVIDER_SITE_OTHER): Payer: Self-pay | Admitting: Otolaryngology

## 2021-01-13 VITALS — Temp 96.1°F

## 2021-01-13 DIAGNOSIS — R490 Dysphonia: Secondary | ICD-10-CM | POA: Diagnosis not present

## 2021-01-13 DIAGNOSIS — J387 Other diseases of larynx: Secondary | ICD-10-CM | POA: Diagnosis not present

## 2021-01-13 DIAGNOSIS — K219 Gastro-esophageal reflux disease without esophagitis: Secondary | ICD-10-CM

## 2021-01-13 NOTE — Progress Notes (Signed)
HPI: Larry [name] is a 70 y.o. male who presents is referred by his PCP for evaluation of complaints of a chronic right-sided sore throat.  He is also had hoarseness and complaints of sensation of fullness in both ears. He felt like the sore throat and hoarseness began following rotator cuff surgery he had performed in November of last year.  He does not really describe trouble eating or swallowing but more of a chronic right-sided throat discomfort when he swallows his saliva.  On discussion with him in the office today he has minimal hoarseness. He used to be followed by Dr. Ernesto Rutherford and had previous surgery with Dr. Ernesto Rutherford on his nose and throat because of questionable sleep apnea.  More recently he has been reevaluated for sleep apnea after gaining weight and apparently is in the process of getting a CPAP machine for sleep apnea.  Past Medical History:  Diagnosis Date  . Arthritis    "knees; left shoulder" (04/12/2013)  . Asthma    "as a child" (04/12/2013)  . DDD (degenerative disc disease), cervical   . DDD (degenerative disc disease), lumbar   . Difficult intubation    2012   CERVICAL FUSION   . Headache(784.0)    VERAPAMIL FOR PREVENTION   . 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 History of chicken pox    as an adult  . Hypoglycemia    last episode 1 yr. ago, just gets jittery  . Hypothyroidism   . Lumbar stenosis   . MRSA (methicillin resistant staph aureus) culture positive 09/10/2019   upper arm  . OSA (obstructive sleep apnea)    "haven't wore mask in years; had OR; still snore" (04/12/2013)  . Urinary frequency    Past Surgical History:  Procedure Laterality Date  . BACK SURGERY    . CARPOMETACARPEL SUSPENSION PLASTY Left 07/19/2020   Procedure: LEFT THUMB CARPOMETACARPAL ([place]) ARTHROPLASTY;  Surgeon: Leandrew Koyanagi, MD;  Location: [facility];  Service:  Orthopedics;  Laterality: Left;  . CERVICAL FUSION  2002; 2012  . CYSTECTOMY    . INGUINAL HERNIA REPAIR Bilateral 1997  . KNEE ARTHROSCOPY Left ~ 1997; ~ 2005  . LUMBAR FUSION  2007; 04/11/2013  . PENILE DEBRIDEMENT  ~ 2011   X 3  FOR MRSA INFECTION    . SHOULDER ARTHROSCOPY Left ~ 2003  . TONSILLECTOMY  1990's  . TOTAL KNEE ARTHROPLASTY Right 07/23/2015  . TOTAL KNEE ARTHROPLASTY Right 07/23/2015   Procedure: TOTAL KNEE ARTHROPLASTY;  Surgeon: Garald Balding, MD;  Location: [facility];  Service: Orthopedics;  Laterality: Right;  . TOTAL KNEE ARTHROPLASTY Left 10/03/2019   Procedure: LEFT TOTAL KNEE ARTHROPLASTY;  Surgeon: Garald Balding, MD;  [facility];  Service: Orthopedics;  Laterality: Left;  . UVULOPALATOPHARYNGOPLASTY (UPPP)/TONSILLECTOMY/SEPTOPLASTY  1990's  . VASECTOMY     Social History   Socioeconomic History  . Marital status: Married    Spouse name: Not on file  . Number of children: Not on file  . Years of education: Not on file  . Highest education level: Not on file  Occupational History  . Not on file  Tobacco Use  . Smoking status: Former Smoker    Packs/day: 0.50    Years: 15.00    Pack years: 7.50    Types: Cigarettes    Quit date: 10/14/1986    Years since quitting:  34.2  . Smokeless tobacco: Never Used  Vaping Use  . Vaping Use: Never used  Substance and Sexual Activity  . Alcohol use: Yes    Comment: occasional  . Drug use: No  . Sexual activity: Yes  Other Topics Concern  . Not on file  Social History Narrative  . Not on file   Social Determinants of Health   Financial Resource Strain: Not on file  Food Insecurity: Not on file  Transportation Needs: Not on file  Physical Activity: Not on file  Stress: Not on file  Social Connections: Not on file   No family history on file. No Known Allergies Prior to Admission medications   Medication Sig Start Date End Date Taking? Authorizing Provider  amoxicillin (AMOXIL) 500 MG tablet  Take all 4 tablets 1 hour prior to dental appointment 10/29/20   Garald Balding, MD  aspirin EC 81 MG tablet Take 81 mg by mouth daily.    [provider]  furosemide (LASIX) 40 MG tablet Take 40 mg by mouth daily as needed for fluid or edema.     [provider]  HYDROcodone-acetaminophen (NORCO/VICODIN) 5-325 MG tablet Take 1 tablet by mouth every 6 (six) hours as needed for moderate pain. 11/02/19   Garald Balding, MD  HYDROmorphone (DILAUDID) 2 MG tablet Take 1 tablet (2 mg total) by mouth every 6 (six) hours as needed for severe pain. 08/21/20   Leandrew Koyanagi, MD  ketorolac (TORADOL) 10 MG tablet Take 1 tablet (10 mg total) by mouth 2 (two) times daily as needed. 07/19/20   Leandrew Koyanagi, MD  levothyroxine (SYNTHROID) 200 MCG tablet Take 200 mcg by mouth daily before breakfast.     [provider]  LORazepam (ATIVAN) 0.5 MG tablet Take 0.5-1 mg by mouth at bedtime.  03/14/17   [provider]  losartan (COZAAR) 50 MG tablet Take 50 mg by mouth daily.  07/17/19   [provider]  meloxicam (MOBIC) 15 MG tablet Take 15 mg by mouth daily.  09/27/18   [provider]  Multiple Vitamin (MULTIVITAMIN WITH MINERALS) TABS tablet Take 1 tablet by mouth daily.    [provider]  Sildenafil Citrate (VIAGRA PO) Take 30-60 mg by mouth daily as needed (for erectile dysfunction). Purchased from [facility] [place].[employer]--dose indicated on website as 30 mg    [provider]  tamsulosin (FLOMAX) 0.4 MG CAPS capsule Take 0.4 mg by mouth daily.    [provider]  testosterone enanthate (DELATESTRYL) 200 MG/ML injection Inject 200 mg into the muscle every 14 (fourteen) days. Fridays. 05/18/19   [provider]  traMADol (ULTRAM) 50 MG tablet Take 1 tablet (50 mg total) by mouth 2 (two) times daily as needed. 09/04/20   Garald Balding, MD  verapamil (CALAN-SR) 180 MG CR tablet Take 180 mg by mouth daily. 12/31/16   [provider]     Positive ROS: Otherwise negative  All other systems have been reviewed and were otherwise negative with the exception of those mentioned in the HPI and as above.  Physical Exam: Constitutional: Alert, well-appearing, no acute distress Ears: External ears without lesions or tenderness. Ear canals are clear bilaterally.  Both TMs are clear with good mobility on pneumatic otoscopy.  No middle ear effusion noted. Nasal: External nose without lesions. Septum relatively midline with moderate rhinitis.  After decongesting the nose both the middle meatus regions were clear with no evidence of mucopurulent discharge.  No polyps noted.Marland Kitchen  On nasal endoscopy the nasopharynx was clear and the eustachian tube areas were unobstructed. Oral: Lips and gums without lesions. Tongue and palate mucosa without lesions. Posterior oropharynx clear.  Patient is status post tonsillectomy.  Indirect laryngoscopy was difficult. Fiberoptic laryngoscopy was performed to the right nostril.  The nasopharynx was clear.  The base of tongue and vallecula were clear.  On examination of the epiglottis he had some white exudate and slight swelling on the right side and tip of the epiglottis.  Possible Candida.  The laryngeal surface of epiglottis was clear otherwise.  Vocal cords were clear with normal vocal cord mobility.  Mild edema of the arytenoid mucosa but no mucosal lesions noted. Neck: No palpable adenopathy or masses.  No palpable adenopathy on either side of the neck.  The area of his discomfort on the right side corresponds to approximate level of the larynx.  But there is no palpable adenopathy or masses in this area. Respiratory: Breathing comfortably  Skin: No facial/neck lesions or rash noted.  Laryngoscopy  Date/Time: 01/13/2021 5:55 PM Performed by: Rozetta Nunnery, MD Authorized by: Rozetta Nunnery, MD   Consent:    Consent obtained:  Verbal   Consent given by:  Patient Procedure  details:    Indications: hoarseness, dysphagia, or aspiration     Medication:  Afrin   Instrument: flexible fiberoptic laryngoscope     Scope location: right nare   Sinus:    Right nasopharynx: normal     Right Eustachian tube orifices: normal   Mouth:    Oropharynx: normal     Vallecula: normal     Base of tongue: normal     Epiglottis: normal     inflammation   Throat:    Pyriform sinus: normal     True vocal cords: normal   Comments:     On fiberoptic laryngoscopy patient had slight inflammation possible exudate or Candida or possible leukoplakia of the distal tip on the right side of the epiglottis.  He also had moderate mucosal edema with arytenoid mucosa.  Vocal cords were clear with normal vocal mobility.    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 Chronic rhinitis. Recently diagnosed obstructive sleep apnea.  Plan: Prescribed nystatin suspension to gargle and swallow 10 cc 3 times daily for the next 10days. Also prescribed omeprazole 40 mg daily before dinner for the next 6weeks. Also recommended continue use of the Flonase 2 sprays each nostril at night. He will follow-up in 5 to 6 weeks for recheck.  Especially to recheck the epiglottis and vocal cords.   Radene Journey, MD   CC:

## 2021-01-14 DIAGNOSIS — N5201 Erectile dysfunction due to arterial insufficiency: Secondary | ICD-10-CM | POA: Diagnosis not present

## 2021-01-14 DIAGNOSIS — E291 Testicular hypofunction: Secondary | ICD-10-CM | POA: Diagnosis not present

## 2021-01-14 DIAGNOSIS — N401 Enlarged prostate with lower urinary tract symptoms: Secondary | ICD-10-CM | POA: Diagnosis not present

## 2021-01-14 DIAGNOSIS — R350 Frequency of micturition: Secondary | ICD-10-CM | POA: Diagnosis not present

## 2021-01-16 ENCOUNTER — Encounter: Payer: Self-pay | Admitting: Orthopaedic Surgery

## 2021-01-16 ENCOUNTER — Ambulatory Visit (INDEPENDENT_AMBULATORY_CARE_PROVIDER_SITE_OTHER): Payer: Medicare Other | Admitting: Orthopaedic Surgery

## 2021-01-16 ENCOUNTER — Other Ambulatory Visit: Payer: Self-pay

## 2021-01-16 VITALS — Ht 66.0 in | Wt 184.0 lb

## 2021-01-16 DIAGNOSIS — S46012D Strain of muscle(s) and tendon(s) of the rotator cuff of left shoulder, subsequent encounter: Secondary | ICD-10-CM | POA: Diagnosis not present

## 2021-01-16 NOTE — Progress Notes (Signed)
Office Visit Note   Patient: Larry [name]           Date of Birth: 1951/08/31           MRN: [id number] Visit Date: 01/16/2021              Requested by: Merrilee Seashore, [address] [place],  [place] [id number] PCP: Merrilee Seashore, MD   Assessment & Plan: Visit Diagnoses:  1. Traumatic complete tear of left rotator cuff, subsequent encounter     Plan: 6 months status post rotator cuff tear repair of left shoulder.  Has been working pretty much full-time at [employer] and is happy with his progress.  I think he is reached maximal medical improvement will have a 20% permanent partial impairment to the left upper extremity.  He does not think he will need any limitations but can be careful with what he does at [employer].  He does have other comorbidities that might limit his activities but he thinks that should not be a problem.  Follow-Up Instructions: Return if symptoms worsen or fail to improve.   Orders:  No orders of the defined types were placed in this encounter.  No orders of the defined types were placed in this encounter.     Procedures: No procedures performed   Clinical Data: No additional findings.   Subjective: Chief Complaint  Patient presents with  . Left Shoulder - Follow-up    Left shoulder arthroscopy 08/15/2020  Patient presents today for follow up on his left shoulder. He had a left shoulder arthroscopy on 08/15/2020. Patient states that he is doing well overall.   HPI  Review of Systems   Objective: Vital Signs: Ht 5\' 6"  (1.676 m)   Wt 184 lb (83.5 kg)   BMI 29.70 kg/m   Physical Exam Constitutional:      Appearance: He is well-developed.  Eyes:     Pupils: Pupils are equal, round, and reactive to light.  Pulmonary:     Effort: Pulmonary effort is normal.  Skin:    General: Skin is warm and dry.  Neurological:     Mental Status: He is alert and oriented to person, place, and time.  Psychiatric:         Behavior: Behavior normal.     Ortho Exam awake alert and oriented x3.  Comfortable sitting could easily place the left arm fully overhead and abduction at least 90 degrees.  Negative impingement empty can testing.  Great strength.  Neurologically intact.  No significant pain over the [facility] joint or the anterior subacromial region  Specialty Comments:  No specialty comments available.  Imaging: No results found.   PMFS History: Patient Active Problem List   Diagnosis Date Noted  . CMC (carpometacarpal joint) dislocation, left, initial encounter 11/12/2020  . Post-operative state 08/20/2020  . Nontraumatic incomplete tear of left rotator cuff 08/20/2020  . AC (acromioclavicular) arthritis 08/20/2020  . Complete tear of left rotator cuff 07/09/2020  . Osteoarthritis of first carpometacarpal [facility]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 05/07/2020  . Osteoarthritis of left AC (acromioclavicular) joint 05/07/2020  . Impingement syndrome of left shoulder 02/08/2020  . S/P TKR (total knee replacement) using cement, left 10/03/2019  . Cellulitis of arm 08/28/2019  . Essential hypertension 02/16/2018  . Sepsis due to skin infection ([place]) 02/16/2018  . Chronic pain 02/16/2018  . Cellulitis of left lower extremity   . Lumbar radiculopathy, chronic 01/24/2018  . Lumbar stenosis with neurogenic claudication 03/23/2017  .  Hypothyroidism 07/25/2015  . Primary osteoarthritis of right knee 07/23/2015  . Primary osteoarthritis of knee 07/23/2015   Past Medical History:  Diagnosis Date  . Arthritis    "knees; left shoulder" (04/12/2013)  . Asthma    "as a child" (04/12/2013)  . DDD (degenerative disc disease), cervical   . DDD (degenerative disc disease), lumbar   . Difficult intubation    2012   CERVICAL FUSION   . Headache(784.0)    VERAPAMIL FOR PREVENTION   . 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 History of chicken  pox    as an adult  . Hypoglycemia    last episode 1 yr. ago, just gets jittery  . Hypothyroidism   . Lumbar stenosis   . MRSA (methicillin resistant staph aureus) culture positive 09/10/2019   upper arm  . OSA (obstructive sleep apnea)    "haven't wore mask in years; had OR; still snore" (04/12/2013)  . Urinary frequency     History reviewed. No pertinent family history.  Past Surgical History:  Procedure Laterality Date  . BACK SURGERY    . CARPOMETACARPEL SUSPENSION PLASTY Left 07/19/2020   Procedure: LEFT THUMB CARPOMETACARPAL ([place]) ARTHROPLASTY;  Surgeon: Leandrew Koyanagi, MD;  Location: [facility];  Service: Orthopedics;  Laterality: Left;  . CERVICAL FUSION  2002; 2012  . CYSTECTOMY    . INGUINAL HERNIA REPAIR Bilateral 1997  . KNEE ARTHROSCOPY Left ~ 1997; ~ 2005  . LUMBAR FUSION  2007; 04/11/2013  . PENILE DEBRIDEMENT  ~ 2011   X 3  FOR MRSA INFECTION    . SHOULDER ARTHROSCOPY Left ~ 2003  . TONSILLECTOMY  1990's  . TOTAL KNEE ARTHROPLASTY Right 07/23/2015  . TOTAL KNEE ARTHROPLASTY Right 07/23/2015   Procedure: TOTAL KNEE ARTHROPLASTY;  Surgeon: Garald Balding, MD;  Location: [facility];  Service: Orthopedics;  Laterality: Right;  . TOTAL KNEE ARTHROPLASTY Left 10/03/2019   Procedure: LEFT TOTAL KNEE ARTHROPLASTY;  Surgeon: Garald Balding, MD;  [facility];  Service: Orthopedics;  Laterality: Left;  . UVULOPALATOPHARYNGOPLASTY (UPPP)/TONSILLECTOMY/SEPTOPLASTY  1990's  . VASECTOMY     Social History   Occupational History  . Not on file  Tobacco Use  . Smoking status: Former Smoker    Packs/day: 0.50    Years: 15.00    Pack years: 7.50    Types: Cigarettes    Quit date: 10/14/1986    Years since quitting: 34.2  . Smokeless tobacco: Never Used  Vaping Use  . Vaping Use: Never used  Substance and Sexual Activity  . Alcohol use: Yes    Comment: occasional  . Drug use: No  . Sexual activity: Yes

## 2021-02-01 ENCOUNTER — Other Ambulatory Visit (INDEPENDENT_AMBULATORY_CARE_PROVIDER_SITE_OTHER): Payer: Self-pay | Admitting: Otolaryngology

## 2021-02-04 ENCOUNTER — Other Ambulatory Visit (INDEPENDENT_AMBULATORY_CARE_PROVIDER_SITE_OTHER): Payer: Self-pay | Admitting: Otolaryngology

## 2021-02-04 DIAGNOSIS — M79671 Pain in right foot: Secondary | ICD-10-CM | POA: Diagnosis not present

## 2021-02-04 DIAGNOSIS — M545 Low back pain, unspecified: Secondary | ICD-10-CM | POA: Diagnosis not present

## 2021-02-04 DIAGNOSIS — R201 Hypoesthesia of skin: Secondary | ICD-10-CM | POA: Diagnosis not present

## 2021-02-04 DIAGNOSIS — M542 Cervicalgia: Secondary | ICD-10-CM | POA: Diagnosis not present

## 2021-02-04 DIAGNOSIS — M79672 Pain in left foot: Secondary | ICD-10-CM | POA: Diagnosis not present

## 2021-02-10 ENCOUNTER — Encounter (INDEPENDENT_AMBULATORY_CARE_PROVIDER_SITE_OTHER): Payer: Self-pay | Admitting: Otolaryngology

## 2021-02-10 ENCOUNTER — Ambulatory Visit (INDEPENDENT_AMBULATORY_CARE_PROVIDER_SITE_OTHER): Payer: Medicare Other | Admitting: Otolaryngology

## 2021-02-10 ENCOUNTER — Other Ambulatory Visit: Payer: Self-pay

## 2021-02-10 VITALS — Temp 97.9°F

## 2021-02-10 DIAGNOSIS — K219 Gastro-esophageal reflux disease without esophagitis: Secondary | ICD-10-CM

## 2021-02-10 DIAGNOSIS — R490 Dysphonia: Secondary | ICD-10-CM | POA: Diagnosis not present

## 2021-02-10 NOTE — Progress Notes (Signed)
HPI: Larry [name] is a 70 y.o. male who returns today for evaluation of vocal cords and hoarseness.  Overall he is doing much better.  He quit smoking over 30 years ago.  His voice is doing better today..  Past Medical History:  Diagnosis Date  . Arthritis    "knees; left shoulder" (04/12/2013)  . Asthma    "as a child" (04/12/2013)  . DDD (degenerative disc disease), cervical   . DDD (degenerative disc disease), lumbar   . Difficult intubation    2012   CERVICAL FUSION   . Headache(784.0)    VERAPAMIL FOR PREVENTION   . 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 History of chicken pox    as an adult  . Hypoglycemia    last episode 1 yr. ago, just gets jittery  . Hypothyroidism   . Lumbar stenosis   . MRSA (methicillin resistant staph aureus) culture positive 09/10/2019   upper arm  . OSA (obstructive sleep apnea)    "haven't wore mask in years; had OR; still snore" (04/12/2013)  . Urinary frequency    Past Surgical History:  Procedure Laterality Date  . BACK SURGERY    . CARPOMETACARPEL SUSPENSION PLASTY Left 07/19/2020   Procedure: LEFT THUMB CARPOMETACARPAL ([place]) ARTHROPLASTY;  Surgeon: Leandrew Koyanagi, MD;  Location: [facility];  Service: Orthopedics;  Laterality: Left;  . CERVICAL FUSION  2002; 2012  . CYSTECTOMY    . INGUINAL HERNIA REPAIR Bilateral 1997  . KNEE ARTHROSCOPY Left ~ 1997; ~ 2005  . LUMBAR FUSION  2007; 04/11/2013  . PENILE DEBRIDEMENT  ~ 2011   X 3  FOR MRSA INFECTION    . SHOULDER ARTHROSCOPY Left ~ 2003  . TONSILLECTOMY  1990's  . TOTAL KNEE ARTHROPLASTY Right 07/23/2015  . TOTAL KNEE ARTHROPLASTY Right 07/23/2015   Procedure: TOTAL KNEE ARTHROPLASTY;  Surgeon: Garald Balding, MD;  Location: [facility];  Service: Orthopedics;  Laterality: Right;  . TOTAL KNEE ARTHROPLASTY Left 10/03/2019   Procedure: LEFT TOTAL KNEE ARTHROPLASTY;  Surgeon: Garald Balding, MD;  [facility];  Service: Orthopedics;  Laterality: Left;  . UVULOPALATOPHARYNGOPLASTY (UPPP)/TONSILLECTOMY/SEPTOPLASTY  1990's  . VASECTOMY     Social History   Socioeconomic History  . Marital status: Married    Spouse name: Not on file  . Number of children: Not on file  . Years of education: Not on file  . Highest education level: Not on file  Occupational History  . Not on file  Tobacco Use  . Smoking status: Former Smoker    Packs/day: 0.50    Years: 15.00    Pack years: 7.50    Types: Cigarettes    Quit date: 10/14/1986    Years since quitting: 34.3  . Smokeless tobacco: Never Used  Vaping Use  . Vaping Use: Never used  Substance and Sexual Activity  . Alcohol use: Yes    Comment: occasional  . Drug use: No  . Sexual activity: Yes  Other Topics Concern  . Not on file  Social History Narrative  . Not on file   Social Determinants of Health   Financial Resource Strain: Not on file  Food Insecurity: Not on file  Transportation Needs: Not on file  Physical Activity: Not on file  Stress: Not on file  Social Connections: Not on file   No family history on file.  No Known Allergies Prior to Admission medications   Medication Sig Start Date End Date Taking? Authorizing Provider  amoxicillin (AMOXIL) 500 MG tablet Take all 4 tablets 1 hour prior to dental appointment 10/29/20   Garald Balding, MD  aspirin EC 81 MG tablet Take 81 mg by mouth daily.    [provider]  furosemide (LASIX) 40 MG tablet Take 40 mg by mouth daily as needed for fluid or edema.     [provider]  HYDROcodone-acetaminophen (NORCO/VICODIN) 5-325 MG tablet Take 1 tablet by mouth every 6 (six) hours as needed for moderate pain. 11/02/19   Garald Balding, MD  HYDROmorphone (DILAUDID) 2 MG tablet Take 1 tablet (2 mg total) by mouth every 6 (six) hours as needed for severe pain. 08/21/20   Leandrew Koyanagi, MD  ketorolac (TORADOL) 10 MG tablet Take 1 tablet (10  mg total) by mouth 2 (two) times daily as needed. 07/19/20   Leandrew Koyanagi, MD  levothyroxine (SYNTHROID) 200 MCG tablet Take 200 mcg by mouth daily before breakfast.     [provider]  LORazepam (ATIVAN) 0.5 MG tablet Take 0.5-1 mg by mouth at bedtime.  03/14/17   [provider]  losartan (COZAAR) 50 MG tablet Take 50 mg by mouth daily.  07/17/19   [provider]  meloxicam (MOBIC) 15 MG tablet Take 15 mg by mouth daily.  09/27/18   [provider]  Multiple Vitamin (MULTIVITAMIN WITH MINERALS) TABS tablet Take 1 tablet by mouth daily.    [provider]  nystatin (MYCOSTATIN) 100000 UNIT/ML suspension TAKE 10 MLS BY MOUTH, SWALLOW, 3 TIMES A DAY FOR 10 DAYS 02/03/21   Rozetta Nunnery, MD  Sildenafil Citrate (VIAGRA PO) Take 30-60 mg by mouth daily as needed (for erectile dysfunction). Purchased from [place]--dose indicated on website as 30 mg    [provider]  tamsulosin (FLOMAX) 0.4 MG CAPS capsule Take 0.4 mg by mouth daily.    [provider]  testosterone enanthate (DELATESTRYL) 200 MG/ML injection Inject 200 mg into the muscle every 14 (fourteen) days. Fridays. 05/18/19   [provider]  traMADol (ULTRAM) 50 MG tablet Take 1 tablet (50 mg total) by mouth 2 (two) times daily as needed. 09/04/20   Garald Balding, MD  verapamil (CALAN-SR) 180 MG CR tablet Take 180 mg by mouth daily. 12/31/16   [provider]     Positive ROS: Otherwise negative  All other systems have been reviewed and were otherwise negative with the exception of those mentioned in the HPI and as above.  Physical Exam: Constitutional: Alert, well-appearing, no acute distress Ears: External ears without lesions or tenderness. Ear canals are clear bilaterally with intact, clear TMs.  Nasal: External nose without lesions. Septum relatively midline with moderate rhinitis.  No signs of infection and no polyps noted.. Clear nasal  passages Oral: Lips and gums without lesions. Tongue and palate mucosa without lesions. Posterior oropharynx clear.  Patient is status post tonsillectomy.  Indirect laryngoscopy revealed a clear base of tongue and epiglottis.  Could not adequately visualize the vocal cords. Fiberoptic laryngoscopy was performed through the right nostril and left nostril.  Again vocal cords were difficult to evaluate because of supraglottic edema as well as mucus and epiglottic omega shaped.  But with limited visualization of the vocal cords they had normal mobility although there was some mild erythema and questionable leukoplakia. Neck: No palpable adenopathy or masses. Respiratory: Breathing comfortably  Skin: No  facial/neck lesions or rash noted.  Laryngoscopy  Date/Time: 02/10/2021 5:39 PM Performed by: Rozetta Nunnery, MD Authorized by: Rozetta Nunnery, MD   Consent:    Consent obtained:  Verbal   Consent given by:  Patient Procedure details:    Indications: hoarseness, dysphagia, or aspiration     Medication:  Afrin   Instrument: flexible fiberoptic laryngoscope     Scope location: bilateral nare   Sinus:    Right nasopharynx: normal     Left nasopharynx: normal   Mouth:    Vallecula: normal     Base of tongue: normal     Epiglottis: normal   Throat:    True vocal cords: normal   Comments:     On fiberoptic laryngoscopy I had limited visualization of the vocal cords because of anatomy.  He had mild erythema and questionable leukoplakia.  This appears to be more reactive versus neoplastic.    Assessment: Discussed with him that I suspect most of his hoarseness is related to reflux disease although it is difficult to adequately visualize the vocal cords even with fiberoptic laryngoscopy.  Plan: I discussed with him concerning continuation with the omeprazole 40 mg daily before dinner as I suspect a lot of his problems are related to reflux disease. Discussed the possibility of  direct laryngoscopy and biopsy and he would like to have this performed and we will plan on scheduling this at his convenience in the next month.   Radene Journey, MD

## 2021-02-27 ENCOUNTER — Other Ambulatory Visit: Payer: Self-pay

## 2021-02-27 ENCOUNTER — Encounter (HOSPITAL_BASED_OUTPATIENT_CLINIC_OR_DEPARTMENT_OTHER): Payer: Self-pay | Admitting: Otolaryngology

## 2021-02-27 DIAGNOSIS — R635 Abnormal weight gain: Secondary | ICD-10-CM | POA: Diagnosis not present

## 2021-02-27 DIAGNOSIS — R600 Localized edema: Secondary | ICD-10-CM | POA: Diagnosis not present

## 2021-03-03 ENCOUNTER — Ambulatory Visit (INDEPENDENT_AMBULATORY_CARE_PROVIDER_SITE_OTHER): Payer: Self-pay | Admitting: Otolaryngology

## 2021-03-03 ENCOUNTER — Encounter (HOSPITAL_BASED_OUTPATIENT_CLINIC_OR_DEPARTMENT_OTHER)
Admission: RE | Admit: 2021-03-03 | Discharge: 2021-03-03 | Disposition: A | Discharge status: Home / Self Care | Payer: Medicare Other | Source: Ambulatory Visit | Attending: Otolaryngology | Admitting: Otolaryngology

## 2021-03-03 DIAGNOSIS — R490 Dysphonia: Secondary | ICD-10-CM

## 2021-03-03 DIAGNOSIS — Z01812 Encounter for preprocedural laboratory examination: Secondary | ICD-10-CM | POA: Diagnosis not present

## 2021-03-03 LAB — BASIC METABOLIC PANEL
Anion gap: 10 (ref 5–15)
BUN: 21 mg/dL (ref 8–23)
CO2: 28 mmol/L (ref 22–32)
Calcium: 8.8 mg/dL — ABNORMAL LOW (ref 8.9–10.3)
Chloride: 99 mmol/L (ref 98–111)
Creatinine, Ser: 0.87 mg/dL (ref 0.61–1.24)
GFR, Estimated: >60 mL/min (ref >60)
Glucose, Bld: 101 mg/dL — ABNORMAL HIGH (ref 70–99)
Potassium: 5.7 mmol/L — ABNORMAL HIGH (ref 3.5–5.1)
Sodium: 137 mmol/L (ref 135–145)

## 2021-03-03 NOTE — H&P (Signed)
PREOPERATIVE H&P  Chief Complaint: Chronic hoarseness  HPI: Larry [name] is a 70 y.o. male who presents for evaluation of chronic hoarseness for several months now.  On fiberoptic laryngoscopy patient had limited visualization of the true vocal cords with slight inflammatory changes of the vocal cords.  He apparently does have history of GE reflux disease.  He has had some leukoplakia involving the epiglottis.  He has moderate supraglottic edema making adequate visualization of the vocal cords little bit difficult.  He is taken to the operating room this time for direct laryngoscopy and biopsy and photos of the vocal cords.  Past Medical History:  Diagnosis Date  . Arthritis    "knees; left shoulder" (04/12/2013)  . Asthma    "as a child" (04/12/2013)  . DDD (degenerative disc disease), cervical   . DDD (degenerative disc disease), lumbar   . Difficult intubation    2012   CERVICAL FUSION   . Headache(784.0)    VERAPAMIL FOR PREVENTION   . 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 History of chicken pox    as an adult  . Hypoglycemia    last episode 1 yr. ago, just gets jittery  . Hypothyroidism   . Lumbar stenosis   . MRSA (methicillin resistant staph aureus) culture positive 09/10/2019   upper arm  . OSA (obstructive sleep apnea)    "haven't wore mask in years; had OR; still snore" (04/12/2013)  . Urinary frequency    Past Surgical History:  Procedure Laterality Date  . BACK SURGERY    . CARPOMETACARPEL SUSPENSION PLASTY Left 07/19/2020   Procedure: LEFT THUMB CARPOMETACARPAL ([place]) ARTHROPLASTY;  Surgeon: Leandrew Koyanagi, MD;  Location: [place];  Service: Orthopedics;  Laterality: Left;  . CERVICAL FUSION  2002; 2012  . CYSTECTOMY    . INGUINAL HERNIA REPAIR Bilateral 1997  . KNEE ARTHROSCOPY Left ~ 1997; ~ 2005  . LUMBAR FUSION  2007; 04/11/2013  . PENILE DEBRIDEMENT   ~ 2011   X 3  FOR MRSA INFECTION    . SHOULDER ARTHROSCOPY Left ~ 2003  . TONSILLECTOMY  1990's  . TOTAL KNEE ARTHROPLASTY Right 07/23/2015  . TOTAL KNEE ARTHROPLASTY Right 07/23/2015   Procedure: TOTAL KNEE ARTHROPLASTY;  Surgeon: Garald Balding, MD;  Location: [place];  Service: Orthopedics;  Laterality: Right;  . TOTAL KNEE ARTHROPLASTY Left 10/03/2019   Procedure: LEFT TOTAL KNEE ARTHROPLASTY;  Surgeon: Garald Balding, MD;  Location: [facility];  Service: Orthopedics;  Laterality: Left;  . UVULOPALATOPHARYNGOPLASTY (UPPP)/TONSILLECTOMY/SEPTOPLASTY  1990's  . VASECTOMY     Social History   Socioeconomic History  . Marital status: Married    Spouse name: Not on file  . Number of children: Not on file  . Years of education: Not on file  . Highest education level: Not on file  Occupational History  . Not on file  Tobacco Use  . Smoking status: Former Smoker    Packs/day: 0.50    Years: 15.00    Pack years: 7.50    Types: Cigarettes    Quit date: 10/14/1986    Years since quitting: 34.4  . Smokeless tobacco: Never Used  Vaping Use  . Vaping Use: Never used  Substance and Sexual Activity  . Alcohol use: Yes    Comment: occasional  . Drug use: No  . Sexual activity: Yes  Other Topics Concern  .  Not on file  Social History Narrative  . Not on file   Social Determinants of Health   Financial Resource Strain: Not on file  Food Insecurity: Not on file  Transportation Needs: Not on file  Physical Activity: Not on file  Stress: Not on file  Social Connections: Not on file   No family history on file. No Known Allergies Prior to Admission medications   Medication Sig Start Date End Date Taking? Authorizing Provider  amoxicillin (AMOXIL) 500 MG tablet Take all 4 tablets 1 hour prior to dental appointment 10/29/20   Garald Balding, MD  aspirin EC 81 MG tablet Take 81 mg by mouth daily.    [provider]  furosemide (LASIX) 40 MG tablet Take 40 mg by mouth daily  as needed for fluid or edema.     [provider]  HYDROcodone-acetaminophen (NORCO/VICODIN) 5-325 MG tablet Take 1 tablet by mouth every 6 (six) hours as needed for moderate pain. 11/02/19   Garald Balding, MD  ketorolac (TORADOL) 10 MG tablet Take 1 tablet (10 mg total) by mouth 2 (two) times daily as needed. 07/19/20   Leandrew Koyanagi, MD  levothyroxine (SYNTHROID) 200 MCG tablet Take 200 mcg by mouth daily before breakfast.     [provider]  LORazepam (ATIVAN) 0.5 MG tablet Take 0.5-1 mg by mouth at bedtime.  03/14/17   [provider]  losartan (COZAAR) 50 MG tablet Take 50 mg by mouth daily.  07/17/19   [provider]  meloxicam (MOBIC) 15 MG tablet Take 15 mg by mouth daily.  09/27/18   [provider]  Multiple Vitamin (MULTIVITAMIN WITH MINERALS) TABS tablet Take 1 tablet by mouth daily.    [provider]  nystatin (MYCOSTATIN) 100000 UNIT/ML suspension TAKE 10 MLS BY MOUTH, SWALLOW, 3 TIMES A DAY FOR 10 DAYS 02/03/21   Rozetta Nunnery, MD  Sildenafil Citrate (VIAGRA PO) Take 30-60 mg by mouth daily as needed (for erectile dysfunction). Purchased from [employer]--dose indicated on website as 30 mg    [provider]  tamsulosin (FLOMAX) 0.4 MG CAPS capsule Take 0.4 mg by mouth daily.    [provider]  testosterone enanthate (DELATESTRYL) 200 MG/ML injection Inject 200 mg into the muscle every 14 (fourteen) days. Fridays. 05/18/19   [provider]  traMADol (ULTRAM) 50 MG tablet Take 1 tablet (50 mg total) by mouth 2 (two) times daily as needed. 09/04/20   Garald Balding, MD  verapamil (CALAN-SR) 180 MG CR tablet Take 180 mg by mouth daily. 12/31/16   [provider]     Positive ROS: Otherwise negative  All other systems have been reviewed and were otherwise negative with the exception of those mentioned in the HPI and as above.  Physical Exam: There were no vitals filed for this  visit.  General: Alert, no acute distress Oral: Normal oral mucosa and tonsils Nasal: Clear nasal passages Fiberoptic laryngoscopy through the right nostril revealed supraglottic edema with mild inflammatory changes of the vocal cords.  Vocal cords with normal mobility.  Questionable inflammation versus neoplastic erythema and swelling. Neck: No palpable adenopathy or thyroid nodules Ear: Ear canal is clear with normal appearing TMs Cardiovascular: Regular rate and rhythm, no murmur.  Respiratory: Clear to auscultation Neurologic: Alert and oriented x 3   Assessment/Plan: Chronic hoarseness  Plan for direct laryngoscopy and biopsy.   Melony Overly, MD 03/03/2021 5:18 PM

## 2021-03-03 NOTE — H&P (View-Only) (Signed)
PREOPERATIVE H&P  Chief Complaint: Chronic hoarseness  HPI: Larry [name] is a 70 y.o. male who presents for evaluation of chronic hoarseness for several months now.  On fiberoptic laryngoscopy patient had limited visualization of the true vocal cords with slight inflammatory changes of the vocal cords.  He apparently does have history of GE reflux disease.  He has had some leukoplakia involving the epiglottis.  He has moderate supraglottic edema making adequate visualization of the vocal cords little bit difficult.  He is taken to the operating room this time for direct laryngoscopy and biopsy and photos of the vocal cords.  Past Medical History:  Diagnosis Date  . Arthritis    "knees; left shoulder" (04/12/2013)  . Asthma    "as a child" (04/12/2013)  . DDD (degenerative disc disease), cervical   . DDD (degenerative disc disease), lumbar   . Difficult intubation    2012   CERVICAL FUSION   . Headache(784.0)    VERAPAMIL FOR PREVENTION   . 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 History of chicken pox    as an adult  . Hypoglycemia    last episode 1 yr. ago, just gets jittery  . Hypothyroidism   . Lumbar stenosis   . MRSA (methicillin resistant staph aureus) culture positive 09/10/2019   upper arm  . OSA (obstructive sleep apnea)    "haven't wore mask in years; had OR; still snore" (04/12/2013)  . Urinary frequency    Past Surgical History:  Procedure Laterality Date  . BACK SURGERY    . CARPOMETACARPEL SUSPENSION PLASTY Left 07/19/2020   Procedure: LEFT THUMB CARPOMETACARPAL ([place]) ARTHROPLASTY;  Surgeon: Leandrew Koyanagi, MD;  Location: [place];  Service: Orthopedics;  Laterality: Left;  . CERVICAL FUSION  2002; 2012  . CYSTECTOMY    . INGUINAL HERNIA REPAIR Bilateral 1997  . KNEE ARTHROSCOPY Left ~ 1997; ~ 2005  . LUMBAR FUSION  2007; 04/11/2013  . PENILE DEBRIDEMENT   ~ 2011   X 3  FOR MRSA INFECTION    . SHOULDER ARTHROSCOPY Left ~ 2003  . TONSILLECTOMY  1990's  . TOTAL KNEE ARTHROPLASTY Right 07/23/2015  . TOTAL KNEE ARTHROPLASTY Right 07/23/2015   Procedure: TOTAL KNEE ARTHROPLASTY;  Surgeon: Garald Balding, MD;  Location: [place];  Service: Orthopedics;  Laterality: Right;  . TOTAL KNEE ARTHROPLASTY Left 10/03/2019   Procedure: LEFT TOTAL KNEE ARTHROPLASTY;  Surgeon: Garald Balding, MD;  Location[facility];  Service: Orthopedics;  Laterality: Left;  . UVULOPALATOPHARYNGOPLASTY (UPPP)/TONSILLECTOMY/SEPTOPLASTY  1990's  . VASECTOMY     Social History   Socioeconomic History  . Marital status: Married    Spouse name: Not on file  . Number of children: Not on file  . Years of education: Not on file  . Highest education level: Not on file  Occupational History  . Not on file  Tobacco Use  . Smoking status: Former Smoker    Packs/day: 0.50    Years: 15.00    Pack years: 7.50    Types: Cigarettes    Quit date: 10/14/1986    Years since quitting: 34.4  . Smokeless tobacco: Never Used  Vaping Use  . Vaping Use: Never used  Substance and Sexual Activity  . Alcohol use: Yes    Comment: occasional  . Drug use: No  . Sexual activity: Yes  Other Topics Concern  .  Not on file  Social History Narrative  . Not on file   Social Determinants of Health   Financial Resource Strain: Not on file  Food Insecurity: Not on file  Transportation Needs: Not on file  Physical Activity: Not on file  Stress: Not on file  Social Connections: Not on file   No family history on file. No Known Allergies Prior to Admission medications   Medication Sig Start Date End Date Taking? Authorizing Provider  amoxicillin (AMOXIL) 500 MG tablet Take all 4 tablets 1 hour prior to dental appointment 10/29/20   Garald Balding, MD  aspirin EC 81 MG tablet Take 81 mg by mouth daily.    [provider]  furosemide (LASIX) 40 MG tablet Take 40 mg by mouth daily  as needed for fluid or edema.     [provider]  HYDROcodone-acetaminophen (NORCO/VICODIN) 5-325 MG tablet Take 1 tablet by mouth every 6 (six) hours as needed for moderate pain. 11/02/19   Garald Balding, MD  ketorolac (TORADOL) 10 MG tablet Take 1 tablet (10 mg total) by mouth 2 (two) times daily as needed. 07/19/20   Leandrew Koyanagi, MD  levothyroxine (SYNTHROID) 200 MCG tablet Take 200 mcg by mouth daily before breakfast.     [provider]  LORazepam (ATIVAN) 0.5 MG tablet Take 0.5-1 mg by mouth at bedtime.  03/14/17   [provider]  losartan (COZAAR) 50 MG tablet Take 50 mg by mouth daily.  07/17/19   [provider]  meloxicam (MOBIC) 15 MG tablet Take 15 mg by mouth daily.  09/27/18   [provider]  Multiple Vitamin (MULTIVITAMIN WITH MINERALS) TABS tablet Take 1 tablet by mouth daily.    [provider]  nystatin (MYCOSTATIN) 100000 UNIT/ML suspension TAKE 10 MLS BY MOUTH, SWALLOW, 3 TIMES A DAY FOR 10 DAYS 02/03/21   Rozetta Nunnery, MD  Sildenafil Citrate (VIAGRA PO) Take 30-60 mg by mouth daily as needed (for erectile dysfunction). Purchased from [employer]--dose indicated on website as 30 mg    [provider]  tamsulosin (FLOMAX) 0.4 MG CAPS capsule Take 0.4 mg by mouth daily.    [provider]  testosterone enanthate (DELATESTRYL) 200 MG/ML injection Inject 200 mg into the muscle every 14 (fourteen) days. Fridays. 05/18/19   [provider]  traMADol (ULTRAM) 50 MG tablet Take 1 tablet (50 mg total) by mouth 2 (two) times daily as needed. 09/04/20   Garald Balding, MD  verapamil (CALAN-SR) 180 MG CR tablet Take 180 mg by mouth daily. 12/31/16   [provider]     Positive ROS: Otherwise negative  All other systems have been reviewed and were otherwise negative with the exception of those mentioned in the HPI and as above.  Physical Exam: There were no vitals filed for this  visit.  General: Alert, no acute distress Oral: Normal oral mucosa and tonsils Nasal: Clear nasal passages Fiberoptic laryngoscopy through the right nostril revealed supraglottic edema with mild inflammatory changes of the vocal cords.  Vocal cords with normal mobility.  Questionable inflammation versus neoplastic erythema and swelling. Neck: No palpable adenopathy or thyroid nodules Ear: Ear canal is clear with normal appearing TMs Cardiovascular: Regular rate and rhythm, no murmur.  Respiratory: Clear to auscultation Neurologic: Alert and oriented x 3   Assessment/Plan: Chronic hoarseness  Plan for direct laryngoscopy and biopsy.   Melony Overly, MD 03/03/2021 5:18 PM

## 2021-03-04 NOTE — Progress Notes (Signed)
K+ 5.7, will repeat BMET day of surgery per Dr. Gloris Manchester.

## 2021-03-06 DIAGNOSIS — R600 Localized edema: Secondary | ICD-10-CM | POA: Diagnosis not present

## 2021-03-07 ENCOUNTER — Encounter (HOSPITAL_BASED_OUTPATIENT_CLINIC_OR_DEPARTMENT_OTHER)
Admission: RE | Disposition: A | Discharge status: Home / Self Care | Payer: Self-pay | Source: Home / Self Care | Attending: Otolaryngology

## 2021-03-07 ENCOUNTER — Other Ambulatory Visit: Payer: Self-pay

## 2021-03-07 ENCOUNTER — Ambulatory Visit (HOSPITAL_BASED_OUTPATIENT_CLINIC_OR_DEPARTMENT_OTHER): Payer: Medicare Other | Admitting: Anesthesiology

## 2021-03-07 ENCOUNTER — Encounter (HOSPITAL_BASED_OUTPATIENT_CLINIC_OR_DEPARTMENT_OTHER): Payer: Self-pay | Admitting: Otolaryngology

## 2021-03-07 ENCOUNTER — Ambulatory Visit (HOSPITAL_BASED_OUTPATIENT_CLINIC_OR_DEPARTMENT_OTHER)
Admission: RE | Admit: 2021-03-07 | Discharge: 2021-03-07 | Disposition: A | Discharge status: Home / Self Care | Payer: Medicare Other | Attending: Otolaryngology | Admitting: Otolaryngology

## 2021-03-07 DIAGNOSIS — Z87891 Personal history of nicotine dependence: Secondary | ICD-10-CM | POA: Insufficient documentation

## 2021-03-07 DIAGNOSIS — Z96653 Presence of artificial knee joint, bilateral: Secondary | ICD-10-CM | POA: Diagnosis not present

## 2021-03-07 DIAGNOSIS — I10 Essential (primary) hypertension: Secondary | ICD-10-CM | POA: Diagnosis not present

## 2021-03-07 DIAGNOSIS — Z79899 Other long term (current) drug therapy: Secondary | ICD-10-CM | POA: Insufficient documentation

## 2021-03-07 DIAGNOSIS — Z7982 Long term (current) use of aspirin: Secondary | ICD-10-CM | POA: Insufficient documentation

## 2021-03-07 DIAGNOSIS — M170 Bilateral primary osteoarthritis of knee: Secondary | ICD-10-CM | POA: Diagnosis not present

## 2021-03-07 DIAGNOSIS — K219 Gastro-esophageal reflux disease without esophagitis: Secondary | ICD-10-CM | POA: Insufficient documentation

## 2021-03-07 DIAGNOSIS — R490 Dysphonia: Secondary | ICD-10-CM | POA: Diagnosis not present

## 2021-03-07 DIAGNOSIS — Z79890 Hormone replacement therapy (postmenopausal): Secondary | ICD-10-CM | POA: Diagnosis not present

## 2021-03-07 DIAGNOSIS — J387 Other diseases of larynx: Secondary | ICD-10-CM | POA: Diagnosis not present

## 2021-03-07 DIAGNOSIS — J0510 Acute epiglottitis without obstruction: Secondary | ICD-10-CM | POA: Diagnosis not present

## 2021-03-07 DIAGNOSIS — M48062 Spinal stenosis, lumbar region with neurogenic claudication: Secondary | ICD-10-CM | POA: Diagnosis not present

## 2021-03-07 HISTORY — DX: Essential (primary) hypertension: I10

## 2021-03-07 HISTORY — PX: MICROLARYNGOSCOPY: SHX5208

## 2021-03-07 LAB — BASIC METABOLIC PANEL
Anion gap: 9 (ref 5–15)
BUN: 18 mg/dL (ref 8–23)
CO2: 26 mmol/L (ref 22–32)
Calcium: 8.9 mg/dL (ref 8.9–10.3)
Chloride: 104 mmol/L (ref 98–111)
Creatinine, Ser: 0.98 mg/dL (ref 0.61–1.24)
GFR, Estimated: >60 mL/min (ref >60)
Glucose, Bld: 99 mg/dL (ref 70–99)
Potassium: 4.2 mmol/L (ref 3.5–5.1)
Sodium: 139 mmol/L (ref 135–145)

## 2021-03-07 SURGERY — MICROLARYNGOSCOPY
Anesthesia: General | Site: Mouth

## 2021-03-07 MED ORDER — DEXAMETHASONE SODIUM PHOSPHATE 4 MG/ML IJ SOLN
INTRAMUSCULAR | Status: DC | PRN
  Administered 2021-03-07: 10 mg via INTRAVENOUS

## 2021-03-07 MED ORDER — ROCURONIUM BROMIDE 100 MG/10ML IV SOLN
INTRAVENOUS | Status: DC | PRN
  Administered 2021-03-07: 40 mg via INTRAVENOUS

## 2021-03-07 MED ORDER — EPINEPHRINE PF 1 MG/ML IJ SOLN
INTRAMUSCULAR | Status: AC
  Filled 2021-03-07: qty 1

## 2021-03-07 MED ORDER — FENTANYL CITRATE (PF) 100 MCG/2ML IJ SOLN
25.0000 ug | INTRAMUSCULAR | Status: DC | PRN
  Administered 2021-03-07: 50 ug via INTRAVENOUS

## 2021-03-07 MED ORDER — MIDAZOLAM HCL 2 MG/2ML IJ SOLN
INTRAMUSCULAR | Status: AC
  Filled 2021-03-07: qty 2

## 2021-03-07 MED ORDER — CHLORHEXIDINE GLUCONATE CLOTH 2 % EX PADS: 6.0000 | MEDICATED_PAD | Freq: Once | CUTANEOUS | Status: DC

## 2021-03-07 MED ORDER — CEFAZOLIN SODIUM-DEXTROSE 2-3 GM-%(50ML) IV SOLR
INTRAVENOUS | Status: DC | PRN
  Administered 2021-03-07: 2 g via INTRAVENOUS

## 2021-03-07 MED ORDER — FENTANYL CITRATE (PF) 100 MCG/2ML IJ SOLN
INTRAMUSCULAR | Status: DC | PRN
  Administered 2021-03-07 (×2): 50 ug via INTRAVENOUS

## 2021-03-07 MED ORDER — FENTANYL CITRATE (PF) 100 MCG/2ML IJ SOLN
INTRAMUSCULAR | Status: AC
  Filled 2021-03-07: qty 2

## 2021-03-07 MED ORDER — SODIUM CHLORIDE 0.9 % IV SOLN
INTRAVENOUS | Status: DC | PRN
  Administered 2021-03-07 (×5): 120 ug via INTRAVENOUS

## 2021-03-07 MED ORDER — ROCURONIUM BROMIDE 10 MG/ML (PF) SYRINGE
PREFILLED_SYRINGE | INTRAVENOUS | Status: AC
  Filled 2021-03-07: qty 10

## 2021-03-07 MED ORDER — CEFAZOLIN SODIUM-DEXTROSE 2-4 GM/100ML-% IV SOLN
INTRAVENOUS | Status: AC
  Filled 2021-03-07: qty 100

## 2021-03-07 MED ORDER — ONDANSETRON HCL 4 MG/2ML IJ SOLN: 4.0000 mg | Freq: Once | INTRAMUSCULAR | Status: DC | PRN

## 2021-03-07 MED ORDER — EPINEPHRINE PF 1 MG/ML IJ SOLN
INTRAMUSCULAR | Status: DC | PRN
  Administered 2021-03-07: 1 mg

## 2021-03-07 MED ORDER — PROPOFOL 10 MG/ML IV BOLUS
INTRAVENOUS | Status: DC | PRN
  Administered 2021-03-07: 200 mg via INTRAVENOUS

## 2021-03-07 MED ORDER — ONDANSETRON HCL 4 MG/2ML IJ SOLN
INTRAMUSCULAR | Status: AC
  Filled 2021-03-07: qty 2

## 2021-03-07 MED ORDER — CEFAZOLIN SODIUM-DEXTROSE 2-4 GM/100ML-% IV SOLN
2.0000 g | INTRAVENOUS | Status: AC
  Administered 2021-03-07: 2 g via INTRAVENOUS

## 2021-03-07 MED ORDER — SUGAMMADEX SODIUM 200 MG/2ML IV SOLN
INTRAVENOUS | Status: DC | PRN
  Administered 2021-03-07: 200 mg via INTRAVENOUS

## 2021-03-07 MED ORDER — PROPOFOL 10 MG/ML IV BOLUS
INTRAVENOUS | Status: AC
  Filled 2021-03-07: qty 40

## 2021-03-07 MED ORDER — OXYCODONE HCL 5 MG/5ML PO SOLN
5.0000 mg | Freq: Once | ORAL | Status: DC | PRN
Start: 2021-03-07 — End: 2021-03-07

## 2021-03-07 MED ORDER — LACTATED RINGERS IV SOLN: INTRAVENOUS | Status: DC

## 2021-03-07 MED ORDER — LIDOCAINE HCL (CARDIAC) PF 100 MG/5ML IV SOSY
PREFILLED_SYRINGE | INTRAVENOUS | Status: DC | PRN
  Administered 2021-03-07: 30 mg via INTRAVENOUS

## 2021-03-07 MED ORDER — SUCCINYLCHOLINE CHLORIDE 20 MG/ML IJ SOLN
INTRAMUSCULAR | Status: DC | PRN
  Administered 2021-03-07: 180 mg via INTRAVENOUS

## 2021-03-07 MED ORDER — OXYCODONE HCL 5 MG PO TABS: 5.0000 mg | ORAL_TABLET | Freq: Once | ORAL | Status: DC | PRN

## 2021-03-07 MED ORDER — ONDANSETRON HCL 4 MG/2ML IJ SOLN
INTRAMUSCULAR | Status: DC | PRN
  Administered 2021-03-07: 4 mg via INTRAVENOUS

## 2021-03-07 MED ORDER — DEXAMETHASONE SODIUM PHOSPHATE 10 MG/ML IJ SOLN
INTRAMUSCULAR | Status: AC
  Filled 2021-03-07: qty 1

## 2021-03-07 SURGICAL SUPPLY — 29 items
CANISTER SUCT 1200ML W/VALVE (MISCELLANEOUS) ×2 IMPLANT
CNTNR URN SCR LID CUP LEK RST (MISCELLANEOUS) IMPLANT
CONT SPEC 4OZ STRL OR WHT (MISCELLANEOUS) ×2
GAUZE SPONGE 4X4 12PLY STRL LF (GAUZE/BANDAGES/DRESSINGS) ×4 IMPLANT
GLOVE SURG MICRO LTX SZ7.5 (GLOVE) ×2 IMPLANT
GLOVE SURG UNDER POLY LF SZ6.5 (GLOVE) ×1 IMPLANT
GLOVE SURG UNDER POLY LF SZ7 (GLOVE) ×1 IMPLANT
GOWN STRL REUS W/ TWL LRG LVL3 (GOWN DISPOSABLE) IMPLANT
GOWN STRL REUS W/ TWL XL LVL3 (GOWN DISPOSABLE) IMPLANT
GOWN STRL REUS W/TWL LRG LVL3 (GOWN DISPOSABLE) ×2
GOWN STRL REUS W/TWL XL LVL3 (GOWN DISPOSABLE) ×2
GUARD TEETH (MISCELLANEOUS) ×1 IMPLANT
MARKER SKIN DUAL TIP RULER LAB (MISCELLANEOUS) IMPLANT
NDL SAFETY ECLIPSE 18X1.5 (NEEDLE) ×1 IMPLANT
NDL SPNL 22GX7 QUINCKE BK (NEEDLE) IMPLANT
NEEDLE HYPO 18GX1.5 SHARP (NEEDLE)
NEEDLE SPNL 22GX7 QUINCKE BK (NEEDLE) IMPLANT
NS IRRIG 1000ML POUR BTL (IV SOLUTION) ×2 IMPLANT
PACK BASIN DAY SURGERY FS (CUSTOM PROCEDURE TRAY) ×2 IMPLANT
PAD ALCOHOL SWAB (MISCELLANEOUS) ×2 IMPLANT
PATTIES SURGICAL .5 X3 (DISPOSABLE) ×2 IMPLANT
SHEET MEDIUM DRAPE 40X70 STRL (DRAPES) ×2 IMPLANT
SLEEVE SCD COMPRESS KNEE MED (STOCKING) ×2 IMPLANT
SOL ANTI FOG 6CC (MISCELLANEOUS) IMPLANT
SOLUTION ANTI FOG 6CC (MISCELLANEOUS) ×1
SOLUTION BUTLER CLEAR DIP (MISCELLANEOUS) ×2 IMPLANT
SYR CONTROL 10ML LL (SYRINGE) IMPLANT
TOWEL GREEN STERILE FF (TOWEL DISPOSABLE) ×2 IMPLANT
TUBE CONNECTING 20X1/4 (TUBING) ×2 IMPLANT

## 2021-03-07 NOTE — Interval H&P Note (Signed)
History and Physical Interval Note:  03/07/2021 8:44 AM  Larry [name]  has presented today for surgery, with the diagnosis of HOARSENESS.  The various methods of treatment have been discussed with the patient and family. After consideration of risks, benefits and other options for treatment, the patient has consented to  Procedure(s): DIRECT MICROLARYNGOSCOPY WITH BIOPSY (N/A) as a surgical intervention.  The patient's history has been reviewed, patient examined, no change in status, stable for surgery.  I have reviewed the patient's chart and labs.  Questions were answered to the patient's satisfaction.     Melony Overly

## 2021-03-07 NOTE — Anesthesia Preprocedure Evaluation (Signed)
Anesthesia Evaluation  Patient identified by MRN, date of birth, ID band Patient awake    Reviewed: Allergy & Precautions, NPO status , Patient's Chart, lab work & pertinent test results  Airway Mallampati: III  TM Distance: >3 FB Neck ROM: Full    Dental  (+) Teeth Intact, Dental Advisory Given   Pulmonary former smoker,    breath sounds clear to auscultation       Cardiovascular hypertension,  Rhythm:Regular Rate:Normal     Neuro/Psych    GI/Hepatic   Endo/Other    Renal/GU      Musculoskeletal   Abdominal (+) + obese,   Peds  Hematology   Anesthesia Other Findings   Reproductive/Obstetrics                             Anesthesia Physical Anesthesia Plan  ASA: 3  Anesthesia Plan: General   Post-op Pain Management:    Induction: Intravenous  PONV Risk Score and Plan: Ondansetron and Dexamethasone  Airway Management Planned: Oral ETT  Additional Equipment:   Intra-op Plan:   Post-operative Plan: Extubation in OR  Informed Consent: I have reviewed the patients History and Physical, chart, labs and discussed the procedure including the risks, benefits and alternatives for the proposed anesthesia with the patient or authorized representative who has indicated his/her understanding and acceptance.     Dental advisory given  Plan Discussed with: CRNA and Anesthesiologist  Anesthesia Plan Comments:         Anesthesia Quick Evaluation

## 2021-03-07 NOTE — Op Note (Signed)
NAME: Larry [name], Larry [name] MEDICAL RECORD NO: [id number] ACCOUNT NO: [id number] DATE OF BIRTH: 08-May-1951 FACILITY: [facility] LOCATION: [facility] PHYSICIAN: Leonides Sake. Lucia Gaskins, MD  Operative Report   DATE OF PROCEDURE: 03/07/2021  PREOPERATIVE DIAGNOSIS:  Chronic hoarseness.  POSTOPERATIVE DIAGNOSIS:  Chronic hoarseness.  OPERATION:  Direct laryngoscopy and biopsy.  SURGEON:  Leonides Sake. Lucia Gaskins, MD  ANESTHESIA:  General endotracheal.  BLOOD LOSS:  Minimal.  COMPLICATIONS:  None.  BRIEF CLINICAL NOTE:  The patient is a 70 year old gentleman who has had intermittent hoarseness for over a year.  He has been treated for reflux symptoms with antacid medicine, which helps a little bit.  On exam in the office with fiberoptic  laryngoscopy, he has some slight leukoplakia of the distal end of the epiglottis with edema and chronic inflammatory changes of the supraglottis with minimal adequate evaluation of the vocal cords because of supraglottic edema.  He is taken to the  operating room at this time for a direct laryngoscopy and biopsy.  Of note, he had normal vocal cord mobility on fiberoptic laryngoscopy.  DESCRIPTION OF PROCEDURE:  After adequate endotracheal anesthesia with a #6 endotracheal tube using a GlideScope, a direct laryngoscopy was performed.  Of note, the patient had a small mouth.  He had previous surgery including UPPP for obstructive sleep  apnea a number of years ago.  The base of tongue was clear to evaluation.  On examination of the epiglottis, he has some slight inflammatory changes and a slight amount of leukoplakia on the distal end of the epiglottis that extended superficially on the  laryngeal surface of the epiglottis, but this was minimal and was soft on palpation with the biopsy forceps and was superficial.  Next, the laryngoscope was passed deeper to evaluate the vocal cords and was suspended.  Photos were obtained.  Again, he  had mild erythema of the vocal cords  bilaterally, but no specific vocal cord lesions and mucosa was soft to palpation with no ulcerations and no raised lesions that warranted biopsy.  Everything appeared more inflammatory or very edematous.  Subglottis  area was clear.  Both piriform sinuses were evaluated and were clear to examination.  Following direct laryngoscopy, the laryngoscope was suspended to view the epiglottis again.  Photos were obtained throughout the procedure of the vocal cords as well as  the epiglottis, and a biopsy was obtained from the distal end of the epiglottis and the laryngeal surface of the epiglottis x2.  Cotton pledgets soaked in adrenaline were placed for hemostasis.  The patient was subsequently awoken from anesthesia and  transferred to recovery room postop doing well.  DISPOSITION:  The patient is discharged home later this morning.  Instructed to take throat lozenges for sore throat, Tylenol and ibuprofen p.r.n. pain.  He will call us concerning results of the biopsy in 5-6 days.  He will continue with antacid  therapy.   [facility] D: 03/07/2021 10:02:41 am T: 03/07/2021 10:52:00 am  JOB: [id number]/ [id number]

## 2021-03-07 NOTE — Brief Op Note (Signed)
03/07/2021  9:54 AM  PATIENT:  Larry [name]  70 y.o. male  PRE-OPERATIVE DIAGNOSIS:  HOARSENESS  POST-OPERATIVE DIAGNOSIS:  HOARSENESS  PROCEDURE:  Procedure(s): DIRECT MICROLARYNGOSCOPY WITH BIOPSY (N/A)  SURGEON:  Surgeon(s) and Role:    * Rozetta Nunnery, MD - Primary  PHYSICIAN ASSISTANT:   ASSISTANTS: none   ANESTHESIA:   general  EBL:  minimal   BLOOD ADMINISTERED:none  DRAINS: none   LOCAL MEDICATIONS USED:  NONE  SPECIMEN:  Source of Specimen:  epiglottis  DISPOSITION OF SPECIMEN:  PATHOLOGY  COUNTS:  YES  TOURNIQUET:  * No tourniquets in log *  DICTATION: .dictated on phone  #[phone]  PLAN OF CARE: Discharge to home after PACU  PATIENT DISPOSITION:  PACU - hemodynamically stable.   Delay start of Pharmacological VTE agent (>24hrs) due to surgical blood loss or risk of bleeding: yes

## 2021-03-07 NOTE — Discharge Instructions (Addendum)
Tylenol or ibuprofen prn pain Throat lozenges or spray for sore throat as needed Diet as tolerated Continue with antacid medication  Post Anesthesia Home Care Instructions  Activity: Get plenty of rest for the remainder of the day. A responsible individual must stay with you for 24 hours following the procedure.  For the next 24 hours, DO NOT: -Drive a car -Paediatric nurse -Drink alcoholic beverages -Take any medication unless instructed by your physician -Make any legal decisions or sign important papers.  Meals: Start with liquid foods such as gelatin or soup. Progress to regular foods as tolerated. Avoid greasy, spicy, heavy foods. If nausea and/or vomiting occur, drink only clear liquids until the nausea and/or vomiting subsides. Call your physician if vomiting continues.  Special Instructions/Symptoms: Your throat may feel dry or sore from the anesthesia or the breathing tube placed in your throat during surgery. If this causes discomfort, gargle with warm salt water. The discomfort should disappear within 24 hours.  If you had a scopolamine patch placed behind your ear for the management of post- operative nausea and/or vomiting:  1. The medication in the patch is effective for 72 hours, after which it should be removed.  Wrap patch in a tissue and discard in the trash. Wash hands thoroughly with soap and water. 2. You may remove the patch earlier than 72 hours if you experience unpleasant side effects which may include dry mouth, dizziness or visual disturbances. 3. Avoid touching the patch. Wash your hands with soap and water after contact with the patch.

## 2021-03-07 NOTE — Transfer of Care (Signed)
Immediate Anesthesia Transfer of Care Note  Patient: Larry [name]  Procedure(s) Performed: DIRECT MICROLARYNGOSCOPY WITH BIOPSY (Mouth)  Patient Location: PACU  Anesthesia Type:General  Level of Consciousness: awake, alert  and oriented  Airway & Oxygen Therapy: Patient Spontanous Breathing and Patient connected to face mask oxygen  Post-op Assessment: Report given to RN and Post -op Vital signs reviewed and stable  Post vital signs: Reviewed and stable  Last Vitals:  Vitals Value Taken Time  BP 147/86 03/07/21 1004  Temp    Pulse 80 03/07/21 1005  Resp 15 03/07/21 1005  SpO2 97 % 03/07/21 1005  Vitals shown include unvalidated device data.  Last Pain:  Vitals:   03/07/21 0744  TempSrc: Oral  PainSc: 0-No pain         Complications: No notable events documented.

## 2021-03-07 NOTE — Anesthesia Procedure Notes (Signed)
Procedure Name: Intubation Date/Time: 03/07/2021 9:21 AM Performed by: Verita Lamb, CRNA Pre-anesthesia Checklist: Patient identified, Emergency Drugs available, Suction available and Patient being monitored Patient Re-evaluated:Patient Re-evaluated prior to induction Oxygen Delivery Method: Circle system utilized Preoxygenation: Pre-oxygenation with 100% oxygen Induction Type: IV induction Ventilation: Mask ventilation without difficulty Laryngoscope Size: Mac, 4 and Glidescope Grade View: Grade II Tube type: Oral Number of attempts: 1 Airway Equipment and Method: Stylet, Oral airway and Video-laryngoscopy Placement Confirmation: ETT inserted through vocal cords under direct vision, positive ETCO2 and breath sounds checked- equal and bilateral Tube secured with: Tape Dental Injury: Teeth and Oropharynx as per pre-operative assessment  Difficulty Due To: Difficulty was anticipated, Difficult Airway- due to large tongue, Difficult Airway- due to reduced neck mobility, Difficult Airway- due to anterior larynx, Difficult Airway-  due to edematous airway, Difficult Airway- due to limited oral opening and Difficult Airway- due to dentition Future Recommendations: Recommend- induction with short-acting agent, and alternative techniques readily available Comments: Intubated by Dr. Linna Caprice

## 2021-03-08 ENCOUNTER — Other Ambulatory Visit (INDEPENDENT_AMBULATORY_CARE_PROVIDER_SITE_OTHER): Payer: Self-pay | Admitting: Otolaryngology

## 2021-03-10 ENCOUNTER — Encounter (HOSPITAL_BASED_OUTPATIENT_CLINIC_OR_DEPARTMENT_OTHER): Payer: Self-pay | Admitting: Otolaryngology

## 2021-03-11 ENCOUNTER — Telehealth (INDEPENDENT_AMBULATORY_CARE_PROVIDER_SITE_OTHER): Payer: Self-pay | Admitting: Otolaryngology

## 2021-03-11 LAB — SURGICAL PATHOLOGY

## 2021-03-11 NOTE — Telephone Encounter (Signed)
Patient is requesting a call back to discuss his results from my charts. He is wanting a Rx called in.

## 2021-03-12 NOTE — Anesthesia Postprocedure Evaluation (Signed)
Anesthesia Post Note  Patient: Larry [name]  Procedure(s) Performed: DIRECT MICROLARYNGOSCOPY WITH BIOPSY (Mouth)     Patient location during evaluation: PACU Anesthesia Type: General Level of consciousness: awake and alert Pain management: pain level controlled Vital Signs Assessment: post-procedure vital signs reviewed and stable Respiratory status: spontaneous breathing, nonlabored ventilation, respiratory function stable and patient connected to nasal cannula oxygen Cardiovascular status: blood pressure returned to baseline and stable Postop Assessment: no apparent nausea or vomiting Anesthetic complications: no   No notable events documented.  Last Vitals:  Vitals:   03/07/21 1030 03/07/21 1106  BP:  (!) 141/75  Pulse: 72 74  Resp: 17 20  Temp:  36.5 C  SpO2: 94% 94%    Last Pain:  Vitals:   03/07/21 1106  TempSrc: Oral  PainSc: 1                  Ellarose Brandi COKER

## 2021-03-13 ENCOUNTER — Telehealth (INDEPENDENT_AMBULATORY_CARE_PROVIDER_SITE_OTHER): Payer: Self-pay | Admitting: Otolaryngology

## 2021-03-13 DIAGNOSIS — I10 Essential (primary) hypertension: Secondary | ICD-10-CM | POA: Diagnosis not present

## 2021-03-13 DIAGNOSIS — E291 Testicular hypofunction: Secondary | ICD-10-CM | POA: Diagnosis not present

## 2021-03-13 DIAGNOSIS — M150 Primary generalized (osteo)arthritis: Secondary | ICD-10-CM | POA: Diagnosis not present

## 2021-03-13 DIAGNOSIS — D696 Thrombocytopenia, unspecified: Secondary | ICD-10-CM | POA: Diagnosis not present

## 2021-03-13 NOTE — Telephone Encounter (Signed)
Called patient today concerning results of his epiglottic biopsy that was performed last week.  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  No evidence of cancer. Reviewed the pathology with the patient on the phone today.  I suspect this chronic inflammation is probably related to reflux disease.  I suggested staying on the omeprazole on a regular basis for the next few months and follow-up here in 5 to 6 months for recheck.  He will return earlier if he develops any worsening sore throat or worsening voice changes. Of note on phone conversation today his voice sounded good with no significant hoarseness.

## 2021-03-19 ENCOUNTER — Telehealth (INDEPENDENT_AMBULATORY_CARE_PROVIDER_SITE_OTHER): Payer: Self-pay | Admitting: Otolaryngology

## 2021-03-19 ENCOUNTER — Encounter (INDEPENDENT_AMBULATORY_CARE_PROVIDER_SITE_OTHER): Payer: Medicare Other | Admitting: Otolaryngology

## 2021-03-19 NOTE — Telephone Encounter (Signed)
Larry [name] called the office I returned a call to him.  He states that he coughed up a little mucus that had little bit of colored mucus in it and wondered if he needed to be on antibiotics.  He is having no trouble breathing.  Voice is normal on the phone.  He is presently using Flonase in the morning. Suggested changing the Flonase to 2 sprays each nostril at night and use of saline irrigation during the day.  If he starts blowing out yellow-green mucus through his nose for 3 to 4 days he will call back to obtain a round of antibiotics.

## 2021-04-01 ENCOUNTER — Other Ambulatory Visit (INDEPENDENT_AMBULATORY_CARE_PROVIDER_SITE_OTHER): Payer: Self-pay | Admitting: Otolaryngology

## 2021-04-09 DIAGNOSIS — R7303 Prediabetes: Secondary | ICD-10-CM | POA: Diagnosis not present

## 2021-04-09 DIAGNOSIS — I10 Essential (primary) hypertension: Secondary | ICD-10-CM | POA: Diagnosis not present

## 2021-04-09 DIAGNOSIS — E291 Testicular hypofunction: Secondary | ICD-10-CM | POA: Diagnosis not present

## 2021-04-09 DIAGNOSIS — E039 Hypothyroidism, unspecified: Secondary | ICD-10-CM | POA: Diagnosis not present

## 2021-04-15 DIAGNOSIS — M79604 Pain in right leg: Secondary | ICD-10-CM | POA: Diagnosis not present

## 2021-04-15 DIAGNOSIS — R202 Paresthesia of skin: Secondary | ICD-10-CM | POA: Diagnosis not present

## 2021-04-15 DIAGNOSIS — M79672 Pain in left foot: Secondary | ICD-10-CM | POA: Diagnosis not present

## 2021-04-15 DIAGNOSIS — M545 Low back pain, unspecified: Secondary | ICD-10-CM | POA: Diagnosis not present

## 2021-04-15 DIAGNOSIS — M79671 Pain in right foot: Secondary | ICD-10-CM | POA: Diagnosis not present

## 2021-04-16 DIAGNOSIS — E039 Hypothyroidism, unspecified: Secondary | ICD-10-CM | POA: Diagnosis not present

## 2021-04-16 DIAGNOSIS — R2232 Localized swelling, mass and lump, left upper limb: Secondary | ICD-10-CM | POA: Diagnosis not present

## 2021-04-16 DIAGNOSIS — I10 Essential (primary) hypertension: Secondary | ICD-10-CM | POA: Diagnosis not present

## 2021-04-16 DIAGNOSIS — Z79899 Other long term (current) drug therapy: Secondary | ICD-10-CM | POA: Diagnosis not present

## 2021-04-16 DIAGNOSIS — E291 Testicular hypofunction: Secondary | ICD-10-CM | POA: Diagnosis not present

## 2021-04-16 DIAGNOSIS — R7303 Prediabetes: Secondary | ICD-10-CM | POA: Diagnosis not present

## 2021-04-16 DIAGNOSIS — D696 Thrombocytopenia, unspecified: Secondary | ICD-10-CM | POA: Diagnosis not present

## 2021-04-24 DIAGNOSIS — R2232 Localized swelling, mass and lump, left upper limb: Secondary | ICD-10-CM | POA: Diagnosis not present

## 2021-05-01 ENCOUNTER — Encounter: Payer: Self-pay | Admitting: Orthopaedic Surgery

## 2021-05-01 ENCOUNTER — Other Ambulatory Visit: Payer: Self-pay

## 2021-05-01 ENCOUNTER — Ambulatory Visit (INDEPENDENT_AMBULATORY_CARE_PROVIDER_SITE_OTHER): Payer: Medicare Other | Admitting: Orthopaedic Surgery

## 2021-05-01 DIAGNOSIS — M79651 Pain in right thigh: Secondary | ICD-10-CM

## 2021-05-01 MED ORDER — AMOXICILLIN 500 MG PO TABS: ORAL_TABLET | ORAL | 0 refills | Status: DC

## 2021-05-01 MED ORDER — AMOXICILLIN 500 MG PO TABS: ORAL_TABLET | ORAL | Status: AC

## 2021-05-01 NOTE — Progress Notes (Signed)
XI  Office Visit Note   Patient: Larry [name]           Date of Birth: 04-Jan-1951           MRN: [id number] Visit Date: 05/01/2021              Requested by: Merrilee Seashore, [address] [place],  [place] [address] PCP: Merrilee Seashore, MD   Assessment & Plan: Visit Diagnoses:  1. Acute thigh pain, right     Plan: [name] experienced acute onset of right buttock pain yesterday after feeling a "pop".  He had immediate pain in the upper part of his thigh and buttock.  He walks with a limp.  He notes that he does have chronic problem with his back but he feels like this is separate.  He has had bilateral knee replacements without any problem.  I think he has probably had an injury to one of the hamstring attachments in the pelvis.  There was no induration or ecchymosis But he might experience that over the next several days.  I did not obtain x-rays I did not think they were necessary.  Neurologically intact.  He does have Vicodin at home in case he needs it.  He is to be initially evaluated for pain clinic next week for his chronic low back pain.  Follow-Up Instructions: Return if symptoms worsen or fail to improve.   Orders:  No orders of the defined types were placed in this encounter.  Meds ordered this encounter  Medications   amoxicillin (AMOXIL) 500 MG tablet    Sig: Take 4 tablets by mouth 1 hour prior to dental procedure.    Dispense:  12 tablet    Refill:  TABLET   amoxicillin (AMOXIL) 500 MG tablet    Sig: Take all 4 tablets 1 hour prior to dental appointment    Dispense:  12 tablet    Refill:  0      Procedures: No procedures performed   Clinical Data: No additional findings.   Subjective: Chief Complaint  Patient presents with   Right Hip - Pain  Patient presents today for right hip pain. He states that he was walking into a shop yesterday and felt a pop in his posterior upper leg. He said that he almost fell and had  immediate pain. He continues to have pain today behind his thigh.   HPI  Review of Systems   Objective: Vital Signs: Ht '5\' 6"'$  (1.676 m)   Wt 202 lb (91.6 kg)   BMI 32.60 kg/m   Physical Exam Constitutional:      Appearance: He is well-developed.  Eyes:     Pupils: Pupils are equal, round, and reactive to light.  Pulmonary:     Effort: Pulmonary effort is normal.  Skin:    General: Skin is warm and dry.  Neurological:     Mental Status: He is alert and oriented to person, place, and time.  Psychiatric:        Behavior: Behavior normal.    Ortho Exam some pain related to the hamstring attachment to the ischium on straight leg raise.  No back pain.  Skin intact.  No induration or ecchymosis along the posterior thigh or buttock.  No pain with internal or external rotation of the hip but no groin discomfort.  Does not have any pain with leg extension but with flexion there was some discomfort referred to that area in the buttock.  Specialty  Comments:  No specialty comments available.  Imaging: No results found.   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 05/07/2020   Osteoarthritis of left AC (acromioclavicular) joint 05/07/2020   Impingement syndrome of left shoulder 02/08/2020   S/P TKR (total knee replacement) using cement, left 10/03/2019   Cellulitis of arm 08/28/2019   Essential hypertension 02/16/2018   Sepsis due to skin infection ([place]) 02/16/2018   Chronic pain 02/16/2018   Cellulitis of left lower extremity    Lumbar radiculopathy, chronic 01/24/2018   Lumbar stenosis  with neurogenic claudication 03/23/2017   Hypothyroidism 07/25/2015   Primary osteoarthritis of right knee 07/23/2015   Primary osteoarthritis of knee 07/23/2015   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 ago, just gets jittery   Hypothyroidism    Lumbar stenosis    MRSA (methicillin resistant staph aureus) culture positive 09/10/2019   upper arm   OSA (obstructive sleep apnea)    "haven't wore mask in years; had OR; still snore" (04/12/2013)   Urinary frequency     History reviewed. No pertinent family history.  Past Surgical History:  Procedure Laterality Date   BACK SURGERY     CARPOMETACARPEL SUSPENSION PLASTY Left 07/19/2020   Procedure: LEFT THUMB CARPOMETACARPAL ([place]) ARTHROPLASTY;  Surgeon: Leandrew Koyanagi, MD;  Location: [place];  Service: Orthopedics;  Laterality: Left;   CERVICAL FUSION  2002; 2012   CYSTECTOMY     INGUINAL HERNIA REPAIR Bilateral 1997   KNEE ARTHROSCOPY Left ~ 1997; ~ 2005   LUMBAR FUSION  2007; 04/11/2013   MICROLARYNGOSCOPY N/A 03/07/2021   Procedure: DIRECT MICROLARYNGOSCOPY WITH BIOPSY;  Surgeon: Rozetta Nunnery, MD;  Location: [place];  Service: ENT;  Laterality: N/A;   PENILE DEBRIDEMENT  ~ 2011   X 3  FOR MRSA INFECTION     SHOULDER ARTHROSCOPY Left ~ 2003   TONSILLECTOMY  1990's   TOTAL KNEE ARTHROPLASTY Right 07/23/2015   TOTAL KNEE ARTHROPLASTY Right 07/23/2015   Procedure: TOTAL KNEE ARTHROPLASTY;  Surgeon:  Garald Balding, MD;  Location: [place];  Service: Orthopedics;  Laterality: Right;   TOTAL KNEE ARTHROPLASTY Left 10/03/2019   Procedure: LEFT TOTAL KNEE ARTHROPLASTY;  Surgeon: Garald Balding, MD;  [facility];  Service: Orthopedics;  Laterality: Left;   UVULOPALATOPHARYNGOPLASTY (UPPP)/TONSILLECTOMY/SEPTOPLASTY  1990's   VASECTOMY     Social History   Occupational History   Not on file  Tobacco Use   Smoking status: Former  Packs/day: 0.50    Years: 15.00    Pack years: 7.50    Types: Cigarettes    Quit date: 10/14/1986    Years since quitting: 34.5   Smokeless tobacco: Never  Vaping Use   Vaping Use: Never used  Substance and Sexual Activity   Alcohol use: Yes    Comment: occasional   Drug use: No   Sexual activity: Yes

## 2021-05-06 DIAGNOSIS — Z5181 Encounter for therapeutic drug level monitoring: Secondary | ICD-10-CM | POA: Diagnosis not present

## 2021-05-06 DIAGNOSIS — G9519 Other vascular myelopathies: Secondary | ICD-10-CM | POA: Diagnosis not present

## 2021-05-06 DIAGNOSIS — Z79899 Other long term (current) drug therapy: Secondary | ICD-10-CM | POA: Diagnosis not present

## 2021-05-12 ENCOUNTER — Other Ambulatory Visit (INDEPENDENT_AMBULATORY_CARE_PROVIDER_SITE_OTHER): Payer: Self-pay | Admitting: Otolaryngology

## 2021-05-28 DIAGNOSIS — Z6832 Body mass index (BMI) 32.0-32.9, adult: Secondary | ICD-10-CM | POA: Diagnosis not present

## 2021-05-28 DIAGNOSIS — R030 Elevated blood-pressure reading, without diagnosis of hypertension: Secondary | ICD-10-CM | POA: Diagnosis not present

## 2021-05-28 DIAGNOSIS — M5416 Radiculopathy, lumbar region: Secondary | ICD-10-CM | POA: Diagnosis not present

## 2021-07-07 ENCOUNTER — Telehealth (INDEPENDENT_AMBULATORY_CARE_PROVIDER_SITE_OTHER): Payer: Self-pay | Admitting: Otolaryngology

## 2021-07-07 ENCOUNTER — Other Ambulatory Visit (INDEPENDENT_AMBULATORY_CARE_PROVIDER_SITE_OTHER): Payer: Self-pay | Admitting: Otolaryngology

## 2021-07-07 MED ORDER — OMEPRAZOLE 40 MG PO CPDR: 40.0000 mg | DELAYED_RELEASE_CAPSULE | Freq: Every day | ORAL | 6 refills | Status: AC

## 2021-07-07 NOTE — Telephone Encounter (Signed)
Patient called requesting refill of omeprazole for 1 year. I sent a refill prescription for omeprazole for 6 months to [facility] on [address] as I think he should be followed up within 6 months for recheck.

## 2021-07-15 DIAGNOSIS — R7303 Prediabetes: Secondary | ICD-10-CM | POA: Diagnosis not present

## 2021-07-15 DIAGNOSIS — E291 Testicular hypofunction: Secondary | ICD-10-CM | POA: Diagnosis not present

## 2021-07-15 DIAGNOSIS — Z79899 Other long term (current) drug therapy: Secondary | ICD-10-CM | POA: Diagnosis not present

## 2021-07-15 DIAGNOSIS — E039 Hypothyroidism, unspecified: Secondary | ICD-10-CM | POA: Diagnosis not present

## 2021-07-15 DIAGNOSIS — D696 Thrombocytopenia, unspecified: Secondary | ICD-10-CM | POA: Diagnosis not present

## 2021-07-16 DIAGNOSIS — G5601 Carpal tunnel syndrome, right upper limb: Secondary | ICD-10-CM | POA: Diagnosis not present

## 2021-07-16 DIAGNOSIS — M545 Low back pain, unspecified: Secondary | ICD-10-CM | POA: Diagnosis not present

## 2021-07-16 DIAGNOSIS — M5412 Radiculopathy, cervical region: Secondary | ICD-10-CM | POA: Diagnosis not present

## 2021-07-21 ENCOUNTER — Other Ambulatory Visit: Payer: Self-pay

## 2021-07-21 ENCOUNTER — Ambulatory Visit (INDEPENDENT_AMBULATORY_CARE_PROVIDER_SITE_OTHER): Payer: Medicare Other | Admitting: Otolaryngology

## 2021-07-21 DIAGNOSIS — K219 Gastro-esophageal reflux disease without esophagitis: Secondary | ICD-10-CM

## 2021-07-21 DIAGNOSIS — J310 Chronic rhinitis: Secondary | ICD-10-CM | POA: Diagnosis not present

## 2021-07-21 NOTE — Progress Notes (Signed)
HPI: Larry [name] is a 70 y.o. male who returns today for evaluation of hoarseness and reflux problems.  His voice is doing better since I saw him 5 months ago.  At that time he underwent microlaryngoscopy on 03/07/2021 because of inflammatory changes and hoarseness.  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  He returns today for follow-up.  He has been on omeprazole 40 mg daily which seems to have helped his symptoms. He also has sleep apnea which has been changed to BiPAP and he seems to be doing better with the BiPAP. He is also using nasal steroid spray because of nasal congestion.  He does complain of dry mouth and was recommended use of Biotene.  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 ago, just gets jittery   Hypothyroidism    Lumbar stenosis    MRSA (methicillin resistant staph aureus) culture positive 09/10/2019   upper arm   OSA (obstructive sleep apnea)    "haven't wore mask in years; had OR; still snore" (04/12/2013)   Urinary frequency    Past Surgical History:  Procedure Laterality Date   BACK SURGERY     CARPOMETACARPEL SUSPENSION PLASTY Left 07/19/2020   Procedure: LEFT THUMB CARPOMETACARPAL ([place]) ARTHROPLASTY;  Surgeon: Leandrew Koyanagi, MD;  Location: [place];  Service: Orthopedics;  Laterality: Left;   CERVICAL FUSION  2002;  2012   CYSTECTOMY     INGUINAL HERNIA REPAIR Bilateral 1997   KNEE ARTHROSCOPY Left ~ 1997; ~ 2005   LUMBAR FUSION  2007; 04/11/2013   MICROLARYNGOSCOPY N/A 03/07/2021   Procedure: DIRECT MICROLARYNGOSCOPY WITH BIOPSY;  Surgeon: Rozetta Nunnery, MD;  Location: [place];  Service: ENT;  Laterality: N/A;   PENILE DEBRIDEMENT  ~ 2011   X 3  FOR MRSA INFECTION     SHOULDER ARTHROSCOPY Left ~ 2003   TONSILLECTOMY  1990's   TOTAL KNEE ARTHROPLASTY Right 07/23/2015   TOTAL KNEE ARTHROPLASTY Right 07/23/2015   Procedure: TOTAL KNEE ARTHROPLASTY;  Surgeon: Garald Balding, MD;  Location: [place];  Service: Orthopedics;  Laterality: Right;   TOTAL KNEE ARTHROPLASTY Left 10/03/2019   Procedure: LEFT TOTAL KNEE ARTHROPLASTY;  Surgeon: Garald Balding, MD;  [facility];  Service: Orthopedics;  Laterality: Left;   UVULOPALATOPHARYNGOPLASTY (UPPP)/TONSILLECTOMY/SEPTOPLASTY  1990's   VASECTOMY     Social History   Socioeconomic History   Marital status: Married    Spouse name: Not on file   Number of children: Not on file   Years of education: Not on file   Highest education level: Not on file  Occupational History   Not on file  Tobacco Use   Smoking status: Former    Packs/day: 0.50  Years: 15.00    Pack years: 7.50    Types: Cigarettes    Quit date: 10/14/1986    Years since quitting: 34.7   Smokeless tobacco: Never  Vaping Use   Vaping Use: Never used  Substance and Sexual Activity   Alcohol use: Yes    Comment: occasional   Drug use: No   Sexual activity: Yes  Other Topics Concern   Not on file  Social History Narrative   Not on file   Social Determinants of Health   Financial Resource Strain: Not on file  Food Insecurity: Not on file  Transportation Needs: Not on file  Physical Activity: Not on file  Stress: Not on file  Social Connections: Not on file   No family history on file. No Known Allergies Prior to Admission medications    Medication Sig Start Date End Date Taking? Authorizing Provider  amoxicillin (AMOXIL) 500 MG tablet Take 4 tablets by mouth 1 hour prior to dental procedure. 05/01/21   Garald Balding, MD  amoxicillin (AMOXIL) 500 MG tablet Take all 4 tablets 1 hour prior to dental appointment 05/01/21   Garald Balding, MD  aspirin EC 81 MG tablet Take 81 mg by mouth daily.    [provider]  furosemide (LASIX) 40 MG tablet Take 40 mg by mouth daily as needed for fluid or edema.     [provider]  HYDROcodone-acetaminophen (NORCO/VICODIN) 5-325 MG tablet Take 1 tablet by mouth every 6 (six) hours as needed for moderate pain. 11/02/19   Garald Balding, MD  ketorolac (TORADOL) 10 MG tablet Take 1 tablet (10 mg total) by mouth 2 (two) times daily as needed. 07/19/20   Leandrew Koyanagi, MD  levothyroxine (SYNTHROID) 200 MCG tablet Take 200 mcg by mouth daily before breakfast.     [provider]  LORazepam (ATIVAN) 0.5 MG tablet Take 0.5-1 mg by mouth at bedtime.  03/14/17   [provider]  losartan (COZAAR) 50 MG tablet Take 50 mg by mouth daily.  07/17/19   [provider]  meloxicam (MOBIC) 15 MG tablet Take 15 mg by mouth daily.  09/27/18   [provider]  Multiple Vitamin (MULTIVITAMIN WITH MINERALS) TABS tablet Take 1 tablet by mouth daily.    [provider]  nystatin (MYCOSTATIN) 100000 UNIT/ML suspension TAKE 10 MLS BY MOUTH, SWALLOW, 3 TIMES A DAY FOR 10 DAYS 02/03/21   Rozetta Nunnery, MD  omeprazole (PRILOSEC) 40 MG capsule TAKE 1 CAPSULE EVERY DAY BEFORE DINNER 04/14/21   Rozetta Nunnery, MD  omeprazole (PRILOSEC) 40 MG capsule Take 1 capsule (40 mg total) by mouth daily. 07/07/21   Rozetta Nunnery, MD  phenylephrine (SUDAFED PE) 10 MG TABS tablet Take 10 mg by mouth every 4 (four) hours as needed.    [provider]  Sildenafil Citrate (VIAGRA PO) Take 30-60 mg by mouth daily as needed (for erectile dysfunction).  Purchased from [employer]--dose indicated on website as 30 mg    [provider]  tamsulosin (FLOMAX) 0.4 MG CAPS capsule Take 0.4 mg by mouth daily.    [provider]  testosterone enanthate (DELATESTRYL) 200 MG/ML injection Inject 200 mg into the muscle every 14 (fourteen) days. Fridays. 05/18/19   [provider]  traMADol (ULTRAM) 50 MG tablet Take 1 tablet (50 mg total) by mouth 2 (two) times daily as needed. 09/04/20   Garald Balding, MD  verapamil (CALAN-SR) 180 MG CR tablet Take 180 mg  by mouth daily. 12/31/16   [provider]  vitamin B-12 (CYANOCOBALAMIN) 100 MCG tablet Take 100 mcg by mouth daily.    [provider]     Positive ROS: Otherwise negative  All other systems have been reviewed and were otherwise negative with the exception of those mentioned in the HPI and as above.  Physical Exam: Constitutional: Alert, well-appearing, no acute distress Ears: External ears without lesions or tenderness. Ear canals are clear bilaterally with intact, clear TMs.  Nasal: External nose without lesions. Septum with mild deformity and moderate rhinitis.  After decongesting the nose fiberoptic laryngoscopy was performed to the right nostril..  The nasopharynx was clear.  The base of tongue and vallecular was clear.  The epiglottis was slightly irregular but mucosal covered with no ulceration.  The laryngeal surface of the epiglottis was clear.  The vocal cords were clear. Oral: Lips and gums without lesions. Tongue and palate mucosa without lesions. Posterior oropharynx clear.  Patient is status post tonsillectomy.  Indirect laryngoscopy revealed a clear base of tongue vallecula and the epiglottis appeared clear. Neck: No palpable adenopathy or masses Respiratory: Breathing comfortably  Skin: No facial/neck lesions or rash noted.  Laryngoscopy  Date/Time: 07/21/2021 4:33 PM Performed by: Rozetta Nunnery, MD Authorized by: Rozetta Nunnery, MD   Consent:    Consent obtained:  Verbal   Consent given by:  Patient Procedure details:    Indications: direct visualization of the upper aerodigestive tract     Medication:  Afrin   Instrument: flexible fiberoptic laryngoscope     Scope location: right nare   Sinus:    Right middle meatus: normal     Right nasopharynx: normal   Mouth:    Oropharynx: normal     Vallecula: normal     Base of tongue: normal     Epiglottis: normal   Throat:    True vocal cords: normal   Comments:     On fiberoptic laryngoscopy patient had minimal irregularity of the epiglottis with no lesions noted or ulcerations noted.  The laryngeal surface of the epiglottis was clear.  Vocal cords were clear bilaterally.  With normal vocal cord mobility.  Assessment: Patient with history of GE reflux disease with history of hoarseness as well as inflammatory changes of the epiglottis. On exam today with indirect laryngoscopy as well as fiberoptic laryngoscopy the epiglottis was clear with clear vocal cords bilaterally.  Plan: Patient with history of GE reflux disease. I would recommend continuation with the omeprazole 40 mg daily before dinner as he seems to be doing better with this medication. Recommended regular use of nasal steroid spray at night as this will help some with nasal congestion. And use of Biotene for complaints of dry mouth.   Radene Journey, MD

## 2021-07-22 DIAGNOSIS — E039 Hypothyroidism, unspecified: Secondary | ICD-10-CM | POA: Diagnosis not present

## 2021-07-22 DIAGNOSIS — D696 Thrombocytopenia, unspecified: Secondary | ICD-10-CM | POA: Diagnosis not present

## 2021-07-22 DIAGNOSIS — E291 Testicular hypofunction: Secondary | ICD-10-CM | POA: Diagnosis not present

## 2021-07-22 DIAGNOSIS — R972 Elevated prostate specific antigen [PSA]: Secondary | ICD-10-CM | POA: Diagnosis not present

## 2021-07-22 DIAGNOSIS — I10 Essential (primary) hypertension: Secondary | ICD-10-CM | POA: Diagnosis not present

## 2021-07-22 DIAGNOSIS — R7303 Prediabetes: Secondary | ICD-10-CM | POA: Diagnosis not present

## 2021-07-22 DIAGNOSIS — Z79899 Other long term (current) drug therapy: Secondary | ICD-10-CM | POA: Diagnosis not present

## 2021-07-22 DIAGNOSIS — Z23 Encounter for immunization: Secondary | ICD-10-CM | POA: Diagnosis not present

## 2021-07-24 ENCOUNTER — Other Ambulatory Visit: Payer: Self-pay | Admitting: Orthopaedic Surgery

## 2021-08-14 ENCOUNTER — Ambulatory Visit (INDEPENDENT_AMBULATORY_CARE_PROVIDER_SITE_OTHER): Payer: Medicare Other | Admitting: Otolaryngology

## 2021-08-18 ENCOUNTER — Other Ambulatory Visit: Payer: Self-pay | Admitting: Orthopaedic Surgery

## 2021-08-18 MED ORDER — AMOXICILLIN 500 MG PO CAPS: ORAL_CAPSULE | ORAL | 0 refills | Status: DC

## 2021-08-27 DIAGNOSIS — Z1152 Encounter for screening for COVID-19: Secondary | ICD-10-CM | POA: Diagnosis not present

## 2021-10-14 DIAGNOSIS — R002 Palpitations: Secondary | ICD-10-CM | POA: Diagnosis not present

## 2021-10-14 DIAGNOSIS — I10 Essential (primary) hypertension: Secondary | ICD-10-CM | POA: Diagnosis not present

## 2021-10-21 DIAGNOSIS — R0981 Nasal congestion: Secondary | ICD-10-CM | POA: Diagnosis not present

## 2021-10-21 DIAGNOSIS — R0989 Other specified symptoms and signs involving the circulatory and respiratory systems: Secondary | ICD-10-CM | POA: Diagnosis not present

## 2021-10-21 DIAGNOSIS — R051 Acute cough: Secondary | ICD-10-CM | POA: Diagnosis not present

## 2021-10-22 DIAGNOSIS — M545 Low back pain, unspecified: Secondary | ICD-10-CM | POA: Diagnosis not present

## 2021-10-22 DIAGNOSIS — M542 Cervicalgia: Secondary | ICD-10-CM | POA: Diagnosis not present

## 2021-10-22 DIAGNOSIS — M5412 Radiculopathy, cervical region: Secondary | ICD-10-CM | POA: Diagnosis not present

## 2021-10-22 DIAGNOSIS — M79671 Pain in right foot: Secondary | ICD-10-CM | POA: Diagnosis not present

## 2021-10-23 DIAGNOSIS — G43109 Migraine with aura, not intractable, without status migrainosus: Secondary | ICD-10-CM | POA: Diagnosis not present

## 2021-10-29 DIAGNOSIS — G4733 Obstructive sleep apnea (adult) (pediatric): Secondary | ICD-10-CM | POA: Diagnosis not present

## 2021-10-29 DIAGNOSIS — R0981 Nasal congestion: Secondary | ICD-10-CM | POA: Diagnosis not present

## 2021-10-29 DIAGNOSIS — R051 Acute cough: Secondary | ICD-10-CM | POA: Diagnosis not present

## 2021-10-29 DIAGNOSIS — I10 Essential (primary) hypertension: Secondary | ICD-10-CM | POA: Diagnosis not present

## 2021-10-30 ENCOUNTER — Other Ambulatory Visit (HOSPITAL_BASED_OUTPATIENT_CLINIC_OR_DEPARTMENT_OTHER): Payer: Self-pay

## 2021-10-30 DIAGNOSIS — G4733 Obstructive sleep apnea (adult) (pediatric): Secondary | ICD-10-CM

## 2021-11-13 DIAGNOSIS — H524 Presbyopia: Secondary | ICD-10-CM | POA: Diagnosis not present

## 2022-01-05 DIAGNOSIS — G4733 Obstructive sleep apnea (adult) (pediatric): Secondary | ICD-10-CM | POA: Diagnosis not present

## 2022-01-14 DIAGNOSIS — M5412 Radiculopathy, cervical region: Secondary | ICD-10-CM | POA: Diagnosis not present

## 2022-01-14 DIAGNOSIS — R202 Paresthesia of skin: Secondary | ICD-10-CM | POA: Diagnosis not present

## 2022-01-14 DIAGNOSIS — M542 Cervicalgia: Secondary | ICD-10-CM | POA: Diagnosis not present

## 2022-01-14 DIAGNOSIS — Z760 Encounter for issue of repeat prescription: Secondary | ICD-10-CM | POA: Diagnosis not present

## 2022-01-20 DIAGNOSIS — I10 Essential (primary) hypertension: Secondary | ICD-10-CM | POA: Diagnosis not present

## 2022-01-20 DIAGNOSIS — Z0000 Encounter for general adult medical examination without abnormal findings: Secondary | ICD-10-CM | POA: Diagnosis not present

## 2022-01-20 DIAGNOSIS — R7303 Prediabetes: Secondary | ICD-10-CM | POA: Diagnosis not present

## 2022-01-20 DIAGNOSIS — Z125 Encounter for screening for malignant neoplasm of prostate: Secondary | ICD-10-CM | POA: Diagnosis not present

## 2022-01-20 DIAGNOSIS — E039 Hypothyroidism, unspecified: Secondary | ICD-10-CM | POA: Diagnosis not present

## 2022-01-20 DIAGNOSIS — D696 Thrombocytopenia, unspecified: Secondary | ICD-10-CM | POA: Diagnosis not present

## 2022-01-25 DIAGNOSIS — I10 Essential (primary) hypertension: Secondary | ICD-10-CM | POA: Diagnosis not present

## 2022-01-25 DIAGNOSIS — E039 Hypothyroidism, unspecified: Secondary | ICD-10-CM | POA: Diagnosis not present

## 2022-01-25 DIAGNOSIS — M150 Primary generalized (osteo)arthritis: Secondary | ICD-10-CM | POA: Diagnosis not present

## 2022-01-27 DIAGNOSIS — G4733 Obstructive sleep apnea (adult) (pediatric): Secondary | ICD-10-CM | POA: Diagnosis not present

## 2022-01-28 DIAGNOSIS — E039 Hypothyroidism, unspecified: Secondary | ICD-10-CM | POA: Diagnosis not present

## 2022-01-28 DIAGNOSIS — Z0000 Encounter for general adult medical examination without abnormal findings: Secondary | ICD-10-CM | POA: Diagnosis not present

## 2022-01-28 DIAGNOSIS — I10 Essential (primary) hypertension: Secondary | ICD-10-CM | POA: Diagnosis not present

## 2022-02-09 DIAGNOSIS — N5201 Erectile dysfunction due to arterial insufficiency: Secondary | ICD-10-CM | POA: Diagnosis not present

## 2022-02-09 DIAGNOSIS — Z125 Encounter for screening for malignant neoplasm of prostate: Secondary | ICD-10-CM | POA: Diagnosis not present

## 2022-02-09 DIAGNOSIS — R350 Frequency of micturition: Secondary | ICD-10-CM | POA: Diagnosis not present

## 2022-02-09 DIAGNOSIS — N401 Enlarged prostate with lower urinary tract symptoms: Secondary | ICD-10-CM | POA: Diagnosis not present

## 2022-03-03 ENCOUNTER — Other Ambulatory Visit: Payer: Self-pay | Admitting: Orthopaedic Surgery

## 2022-03-03 DIAGNOSIS — Z96652 Presence of left artificial knee joint: Secondary | ICD-10-CM

## 2022-03-03 MED ORDER — AMOXICILLIN 500 MG PO CAPS: ORAL_CAPSULE | ORAL | 0 refills | Status: DC

## 2022-03-26 DIAGNOSIS — H903 Sensorineural hearing loss, bilateral: Secondary | ICD-10-CM | POA: Diagnosis not present

## 2022-04-16 DIAGNOSIS — M79671 Pain in right foot: Secondary | ICD-10-CM | POA: Diagnosis not present

## 2022-04-16 DIAGNOSIS — M5412 Radiculopathy, cervical region: Secondary | ICD-10-CM | POA: Diagnosis not present

## 2022-04-16 DIAGNOSIS — M542 Cervicalgia: Secondary | ICD-10-CM | POA: Diagnosis not present

## 2022-04-16 DIAGNOSIS — M545 Low back pain, unspecified: Secondary | ICD-10-CM | POA: Diagnosis not present

## 2022-04-23 DIAGNOSIS — E291 Testicular hypofunction: Secondary | ICD-10-CM | POA: Diagnosis not present

## 2022-04-23 DIAGNOSIS — E039 Hypothyroidism, unspecified: Secondary | ICD-10-CM | POA: Diagnosis not present

## 2022-04-23 DIAGNOSIS — Z79899 Other long term (current) drug therapy: Secondary | ICD-10-CM | POA: Diagnosis not present

## 2022-04-23 DIAGNOSIS — F1190 Opioid use, unspecified, uncomplicated: Secondary | ICD-10-CM | POA: Diagnosis not present

## 2022-04-23 DIAGNOSIS — R7303 Prediabetes: Secondary | ICD-10-CM | POA: Diagnosis not present

## 2022-04-23 DIAGNOSIS — I10 Essential (primary) hypertension: Secondary | ICD-10-CM | POA: Diagnosis not present

## 2022-04-27 DIAGNOSIS — G4733 Obstructive sleep apnea (adult) (pediatric): Secondary | ICD-10-CM | POA: Diagnosis not present

## 2022-04-30 DIAGNOSIS — E291 Testicular hypofunction: Secondary | ICD-10-CM | POA: Diagnosis not present

## 2022-04-30 DIAGNOSIS — R7303 Prediabetes: Secondary | ICD-10-CM | POA: Diagnosis not present

## 2022-04-30 DIAGNOSIS — E039 Hypothyroidism, unspecified: Secondary | ICD-10-CM | POA: Diagnosis not present

## 2022-04-30 DIAGNOSIS — I10 Essential (primary) hypertension: Secondary | ICD-10-CM | POA: Diagnosis not present

## 2022-05-12 ENCOUNTER — Other Ambulatory Visit: Payer: Self-pay | Admitting: Orthopaedic Surgery

## 2022-05-12 DIAGNOSIS — Z96652 Presence of left artificial knee joint: Secondary | ICD-10-CM

## 2022-05-12 MED ORDER — AMOXICILLIN 500 MG PO CAPS: ORAL_CAPSULE | ORAL | 0 refills | Status: AC

## 2022-05-12 NOTE — Telephone Encounter (Signed)
Medication has been sent in to the pharmacy

## 2022-06-09 ENCOUNTER — Encounter: Payer: Self-pay | Admitting: Orthopaedic Surgery

## 2022-06-09 ENCOUNTER — Ambulatory Visit: Payer: Medicare Other | Admitting: Orthopaedic Surgery

## 2022-06-09 ENCOUNTER — Ambulatory Visit (INDEPENDENT_AMBULATORY_CARE_PROVIDER_SITE_OTHER): Payer: Medicare Other

## 2022-06-09 DIAGNOSIS — M79642 Pain in left hand: Secondary | ICD-10-CM | POA: Diagnosis not present

## 2022-06-09 DIAGNOSIS — E039 Hypothyroidism, unspecified: Secondary | ICD-10-CM | POA: Diagnosis not present

## 2022-06-09 MED ORDER — PREDNISONE 10 MG (21) PO TBPK: ORAL_TABLET | ORAL | 3 refills | Status: AC

## 2022-06-09 NOTE — Progress Notes (Signed)
Office Visit Note   Patient: Larry [name]           Date of Birth: Jan 22, 1951           MRN: [id number] Visit Date: 06/09/2022              Requested by: Merrilee Seashore, [address] [place],  [place] [address] PCP: Merrilee Seashore, MD   Assessment & Plan: Visit Diagnoses:  1. Pain in left hand     Plan: Based on findings I would like to check him for carpal tunnel syndrome.  His thumb is doing well from the [facility] arthroplasty.  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  I recommended relative rest and continuing with his meloxicam.  He should try Voltaren gel as well.  We will see him back after the nerve conduction studies.  Follow-Up Instructions: No follow-ups on file.   Orders:  Orders Placed This Encounter  Procedures   XR Hand Complete Left   No orders of the defined types were placed in this encounter.     Procedures: No procedures performed   Clinical Data: No additional findings.   Subjective: Chief Complaint  Patient presents with   Left Hand - Pain    HPI Larry [name] comes in today for evaluation of left hand pain specifically weakness and stiffness in the thumb and index finger.  He underwent left [facility] arthroplasty in October 2021.  He states that his finger started hurting after he started lifting weights about 2 months ago.  Feels some numbness and tingling in his fingertips as well.  Currently remodeling his daughter's home which is made his symptoms worse.  Review of Systems  Constitutional: Negative.   All other systems reviewed and are negative.    Objective: Vital Signs: There were no vitals taken for this visit.  Physical Exam Vitals and nursing note reviewed.  Constitutional:      Appearance: He is well-developed.  HENT:     Head: Normocephalic and atraumatic.  Eyes:     Pupils: Pupils are equal,  round, and reactive to light.  Pulmonary:     Effort: Pulmonary effort is normal.  Abdominal:     Palpations: Abdomen is soft.  Musculoskeletal:        General: Normal range of motion.     Cervical back: Neck supple.  Skin:    General: Skin is warm.  Neurological:     Mental Status: He is alert and oriented to person, place, and time.  Psychiatric:        Behavior: Behavior normal.        Thought Content: Thought content normal.        Judgment: Judgment normal.     Ortho Exam Examination of the left hand shows a fully healed surgical scar.  He has [place] node of the index finger.  He has negative carpal tunnel compressive signs.  The thumb ray is stable to shuck.  He has baseline range of motion.  Normal capillary refill.  He can make a full composite fist. Specialty Comments:  No specialty comments available.  Imaging: No results found.   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 05/07/2020   Osteoarthritis of left AC (acromioclavicular) joint 05/07/2020   Impingement syndrome of left shoulder 02/08/2020   S/P TKR (total knee replacement) using cement, left 10/03/2019   Cellulitis of arm 08/28/2019   Essential hypertension 02/16/2018   Sepsis due to skin infection ([place]) 02/16/2018   Chronic pain 02/16/2018   Cellulitis of left lower extremity    Lumbar radiculopathy, chronic 01/24/2018   Lumbar stenosis with neurogenic claudication 03/23/2017   Hypothyroidism 07/25/2015   Primary osteoarthritis of right knee 07/23/2015   Primary osteoarthritis of knee 07/23/2015   Past  Medical History:  Diagnosis Date   Arthritis    "knees; left shoulder" (04/12/2013)   Asthma    "as a child" (04/12/2013)   DDD (degenerative disc disease), cervical    DDD (degenerative disc disease), lumbar    Difficult intubation    2012   CERVICAL FUSION    Headache(784.0)    VERAPAMIL FOR PREVENTION    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 ago, just gets jittery   Hypothyroidism    Lumbar stenosis    MRSA (methicillin resistant staph aureus) culture positive 09/10/2019   upper arm   OSA (obstructive sleep apnea)    "haven't wore mask in years; had OR; still snore" (04/12/2013)   Urinary frequency     No family history on file.  Past Surgical History:  Procedure Laterality Date   BACK SURGERY     CARPOMETACARPEL SUSPENSION PLASTY Left 07/19/2020   Procedure: LEFT THUMB CARPOMETACARPAL ([place]) ARTHROPLASTY;  Surgeon: Leandrew Koyanagi, MD;  Location: [place];  Service: Orthopedics;  Laterality: Left;   CERVICAL FUSION  2002; 2012   CYSTECTOMY     INGUINAL HERNIA REPAIR Bilateral 1997   KNEE ARTHROSCOPY Left ~ 1997; ~ 2005   LUMBAR FUSION  2007; 04/11/2013   MICROLARYNGOSCOPY N/A 03/07/2021   Procedure: DIRECT MICROLARYNGOSCOPY WITH BIOPSY;  Surgeon: Rozetta Nunnery, MD;  Location: [place];  Service: ENT;  Laterality: N/A;   PENILE DEBRIDEMENT  ~ 2011   X 3  FOR MRSA INFECTION     SHOULDER ARTHROSCOPY Left ~ 2003   TONSILLECTOMY  1990's   TOTAL KNEE ARTHROPLASTY Right 07/23/2015   TOTAL KNEE ARTHROPLASTY Right 07/23/2015   Procedure: TOTAL KNEE ARTHROPLASTY;  Surgeon: Garald Balding, MD;  Location: [place];  Service: Orthopedics;  Laterality: Right;   TOTAL KNEE ARTHROPLASTY Left 10/03/2019   Procedure: LEFT TOTAL KNEE ARTHROPLASTY;  Surgeon: Garald Balding, MD;  [facility];  Service: Orthopedics;  Laterality: Left;   UVULOPALATOPHARYNGOPLASTY (UPPP)/TONSILLECTOMY/SEPTOPLASTY  1990's   VASECTOMY     Social History   Occupational History   Not on file  Tobacco Use   Smoking status: Former    Packs/day: 0.50    Years: 15.00    Total pack years: 7.50    Types: Cigarettes    Quit date: 10/14/1986    Years since quitting: 35.6   Smokeless tobacco: Never  Vaping Use   Vaping Use: Never used  Substance and Sexual Activity  Alcohol use: Yes    Comment: occasional   Drug use: No   Sexual activity: Yes

## 2022-06-30 ENCOUNTER — Ambulatory Visit (INDEPENDENT_AMBULATORY_CARE_PROVIDER_SITE_OTHER): Payer: Medicare Other | Admitting: Physical Medicine and Rehabilitation

## 2022-06-30 ENCOUNTER — Encounter: Payer: Self-pay | Admitting: Physical Medicine and Rehabilitation

## 2022-06-30 DIAGNOSIS — R202 Paresthesia of skin: Secondary | ICD-10-CM | POA: Diagnosis not present

## 2022-06-30 NOTE — Progress Notes (Signed)
Weakness in index finger and thumb that started a couple months ago. No numbness or tingling. Had nerve study 3 years ago

## 2022-07-01 DIAGNOSIS — Z23 Encounter for immunization: Secondary | ICD-10-CM | POA: Diagnosis not present

## 2022-07-01 DIAGNOSIS — E039 Hypothyroidism, unspecified: Secondary | ICD-10-CM | POA: Diagnosis not present

## 2022-07-01 DIAGNOSIS — I10 Essential (primary) hypertension: Secondary | ICD-10-CM | POA: Diagnosis not present

## 2022-07-01 DIAGNOSIS — E291 Testicular hypofunction: Secondary | ICD-10-CM | POA: Diagnosis not present

## 2022-07-02 NOTE — Procedures (Unsigned)
EMG & NCV Findings: Evaluation of the left median motor nerve showed prolonged distal onset latency (4.4 ms), reduced amplitude (3.7 mV), and decreased conduction velocity (Elbow-Wrist, 46 m/s).  The left ulnar motor nerve showed prolonged distal onset latency (4.7 ms), decreased conduction velocity (B Elbow-Wrist, 50 m/s), and decreased conduction velocity (A Elbow-B Elbow, 48 m/s).  The left median (across palm) sensory nerve showed no response (Palm) and prolonged distal peak latency (4.7 ms).  The left ulnar sensory nerve showed prolonged distal peak latency (5.4 ms), reduced amplitude (10.2 V), and decreased conduction velocity (Wrist-5th Digit, 26 m/s).  All remaining nerves (as indicated in the following tables) were within normal limits.    Needle evaluation of the left first dorsal interosseous muscle showed increased insertional activity, increased spontaneous activity, increased motor unit amplitude, and diminished recruitment.  All remaining muscles (as indicated in the following table) showed no evidence of electrical instability.    Impression: The above electrodiagnostic study is ABNORMAL and reveals evidence of a severe Left median nerve entrapment at the wrist (carpal tunnel syndrome) affecting sensory and motor components.   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 Clearly no conduction drop across elbow but no needle EMG changes of the APB.  Seems to be more ulnar nerve at wrist.   There is no significant electrodiagnostic evidence of any other focal nerve entrapment or brachial plexopathy.   Recommendations: 1.  Follow-up with referring physician. 2.  Continue current management of symptoms. 3.  Suggest surgical evaluation.  ___________________________ [name] FAAPMR Board Certified, American Board of Physical Medicine and Rehabilitation    Nerve Conduction Studies Anti Sensory Summary Table    Stim Site NR Peak (ms) Norm Peak (ms) P-T Amp (V) Norm P-T Amp Site1 Site2 Delta-P (ms) Dist (cm) Vel (m/s) Norm Vel (m/s)  Left Median Acr Palm Anti Sensory (2nd Digit)  30.5C  Wrist    *4.7 <3.6 23.5 >10 Wrist Palm  0.0    Palm *NR  <2.0          Left Radial Anti Sensory (Base 1st Digit)  32.5C  Wrist    2.6 <3.1 33.0  Wrist Base 1st Digit 2.6 0.0    Left Ulnar Anti Sensory (5th Digit)  31.4C  Wrist    *5.4 <3.7 *10.2 >15.0 Wrist 5th Digit 5.4 14.0 *26 >38  B Elbow    9.6  103.4  B Elbow Wrist 4.2 0.0  >47   Motor Summary Table   Stim Site NR Onset (ms) Norm Onset (ms) O-P Amp (mV) Norm O-P Amp Site1 Site2 Delta-0 (ms) Dist (cm) Vel (m/s) Norm Vel (m/s)  Left Median Motor (Abd Poll Brev)  32.6C  Wrist    *4.4 <4.2 *3.7 >5 Elbow Wrist 5.0 23.0 *46 >50  Elbow    9.4  3.7         Left Ulnar Motor (Abd Dig Min)  32.5C  Wrist    *4.7 <4.2 3.3 >3 B Elbow Wrist 4.0 20.0 *50 >53  B Elbow    8.7  1.6  A Elbow B Elbow 2.1 10.0 *48 >53  A Elbow    10.8  2.1          EMG   Side Muscle Nerve Root Ins Act Fibs Psw Amp Dur Poly Recrt Int Fraser Din Comment  Left Abd Poll Brev Median C8-T1 Nml Nml Nml Nml Nml 0 Nml Nml   Left 1stDorInt Ulnar  C8-T1 *Incr *3+ *3+ *Incr Nml 0 *Reduced Nml   Left PronatorTeres Median C6-7 Nml Nml Nml Nml Nml 0 Nml Nml   Left Biceps Musculocut C5-6 Nml Nml Nml Nml Nml 0 Nml Nml   Left Deltoid Axillary C5-6 Nml Nml Nml Nml Nml 0 Nml Nml     Nerve Conduction Studies Anti Sensory Left/Right Comparison   Stim Site L Lat (ms) R Lat (ms) L-R Lat (ms) L Amp (V) R Amp (V) L-R Amp (%) Site1 Site2 L Vel (m/s) R Vel (m/s) L-R Vel (m/s)  Median Acr Palm Anti Sensory (2nd Digit)  30.5C  Wrist *4.7   23.5   Wrist Palm     Palm             Radial Anti Sensory (Base 1st Digit)  32.5C  Wrist 2.6   33.0   Wrist Base 1st Digit     Ulnar Anti Sensory (5th Digit)  31.4C  Wrist *5.4   *10.2   Wrist 5th Digit *26    B Elbow 9.6   103.4   B Elbow Wrist      Motor Left/Right  Comparison   Stim Site L Lat (ms) R Lat (ms) L-R Lat (ms) L Amp (mV) R Amp (mV) L-R Amp (%) Site1 Site2 L Vel (m/s) R Vel (m/s) L-R Vel (m/s)  Median Motor (Abd Poll Brev)  32.6C  Wrist *4.4   *3.7   Elbow Wrist *46    Elbow 9.4   3.7         Ulnar Motor (Abd Dig Min)  32.5C  Wrist *4.7   3.3   B Elbow Wrist *50    B Elbow 8.7   1.6   A Elbow B Elbow *48    A Elbow 10.8   2.1            Waveforms:

## 2022-07-06 NOTE — Progress Notes (Unsigned)
Larry [name] - 71 y.o. male MRN [id number]  Date of birth: 1951/01/25  Office Visit Note: Visit Date: 06/30/2022 PCP: Merrilee Seashore, MD Referred by: Leandrew Koyanagi, MD  Subjective: Chief Complaint  Patient presents with   Left Hand - Weakness   HPI:  Larry [name] is a 71 y.o. male who comes in todayHPI ROS Otherwise per HPI.  Assessment & Plan: Visit Diagnoses:    ICD-10-CM   1. Paresthesia of skin  R20.2 NCV with EMG (electromyography)      Plan: Impression: The above electrodiagnostic study is ABNORMAL and reveals evidence of a severe Left median nerve entrapment at the wrist (carpal tunnel syndrome) affecting sensory and motor components.   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 Clearly no conduction drop across elbow but no needle EMG changes of the APB.  Seems to be more ulnar nerve at wrist.   There is no significant electrodiagnostic evidence of any other focal nerve entrapment or brachial plexopathy.   Recommendations: 1.  Follow-up with referring physician. 2.  Continue current management of symptoms. 3.  Suggest surgical evaluation.  Meds & Orders: No orders of the defined types were placed in this encounter.   Orders Placed This Encounter  Procedures   NCV with EMG (electromyography)    Follow-up: No follow-ups on file.   Procedures: No procedures performed  EMG & NCV Findings: Evaluation of the left median motor nerve showed prolonged distal onset latency (4.4 ms), reduced amplitude (3.7 mV), and decreased conduction velocity (Elbow-Wrist, 46 m/s).  The left ulnar motor nerve showed prolonged distal onset latency (4.7 ms), decreased conduction velocity (B Elbow-Wrist, 50 m/s), and decreased conduction velocity (A Elbow-B Elbow, 48 m/s).  The left median (across palm) sensory nerve showed no response (Palm) and prolonged distal peak latency (4.7 ms).  The left ulnar  sensory nerve showed prolonged distal peak latency (5.4 ms), reduced amplitude (10.2 V), and decreased conduction velocity (Wrist-5th Digit, 26 m/s).  All remaining nerves (as indicated in the following tables) were within normal limits.    Needle evaluation of the left first dorsal interosseous muscle showed increased insertional activity, increased spontaneous activity, increased motor unit amplitude, and diminished recruitment.  All remaining muscles (as indicated in the following table) showed no evidence of electrical instability.    Impression: The above electrodiagnostic study is ABNORMAL and reveals evidence of a severe Left median nerve entrapment at the wrist (carpal tunnel syndrome) affecting sensory and motor components.   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 Clearly no conduction drop across elbow but no needle EMG changes of the APB.  Seems to be more ulnar nerve at wrist.   There is no significant electrodiagnostic evidence of any other focal nerve entrapment or brachial plexopathy.   Recommendations: 1.  Follow-up with referring physician. 2.  Continue current management of symptoms. 3.  Suggest surgical evaluation.  ___________________________ [name] FAAPMR Board Certified, American Board of Physical Medicine and Rehabilitation    Nerve Conduction Studies Anti Sensory Summary Table   Stim Site NR Peak (ms) Norm Peak (ms) P-T Amp (V) Norm P-T Amp Site1 Site2 Delta-P (ms) Dist (cm) Vel (m/s) Norm Vel (m/s)  Left Median Acr Palm Anti Sensory (2nd Digit)  30.5C  Wrist    *4.7 <3.6 23.5 >10 Wrist Palm  0.0    Palm *NR  <2.0  Left Radial Anti Sensory (Base 1st Digit)  32.5C  Wrist    2.6 <3.1 33.0  Wrist Base 1st Digit 2.6 0.0    Left Ulnar Anti Sensory (5th Digit)  31.4C  Wrist    *5.4 <3.7 *10.2 >15.0 Wrist 5th Digit 5.4 14.0 *26 >38  B Elbow    9.6  103.4  B Elbow Wrist 4.2 0.0   >47   Motor Summary Table   Stim Site NR Onset (ms) Norm Onset (ms) O-P Amp (mV) Norm O-P Amp Site1 Site2 Delta-0 (ms) Dist (cm) Vel (m/s) Norm Vel (m/s)  Left Median Motor (Abd Poll Brev)  32.6C  Wrist    *4.4 <4.2 *3.7 >5 Elbow Wrist 5.0 23.0 *46 >50  Elbow    9.4  3.7         Left Ulnar Motor (Abd Dig Min)  32.5C  Wrist    *4.7 <4.2 3.3 >3 B Elbow Wrist 4.0 20.0 *50 >53  B Elbow    8.7  1.6  A Elbow B Elbow 2.1 10.0 *48 >53  A Elbow    10.8  2.1          EMG   Side Muscle Nerve Root Ins Act Fibs Psw Amp Dur Poly Recrt Int Fraser Din Comment  Left Abd Poll Brev Median C8-T1 Nml Nml Nml Nml Nml 0 Nml Nml   Left 1stDorInt Ulnar C8-T1 *Incr *3+ *3+ *Incr Nml 0 *Reduced Nml   Left PronatorTeres Median C6-7 Nml Nml Nml Nml Nml 0 Nml Nml   Left Biceps Musculocut C5-6 Nml Nml Nml Nml Nml 0 Nml Nml   Left Deltoid Axillary C5-6 Nml Nml Nml Nml Nml 0 Nml Nml     Nerve Conduction Studies Anti Sensory Left/Right Comparison   Stim Site L Lat (ms) R Lat (ms) L-R Lat (ms) L Amp (V) R Amp (V) L-R Amp (%) Site1 Site2 L Vel (m/s) R Vel (m/s) L-R Vel (m/s)  Median Acr Palm Anti Sensory (2nd Digit)  30.5C  Wrist *4.7   23.5   Wrist Palm     Palm             Radial Anti Sensory (Base 1st Digit)  32.5C  Wrist 2.6   33.0   Wrist Base 1st Digit     Ulnar Anti Sensory (5th Digit)  31.4C  Wrist *5.4   *10.2   Wrist 5th Digit *26    B Elbow 9.6   103.4   B Elbow Wrist      Motor Left/Right Comparison   Stim Site L Lat (ms) R Lat (ms) L-R Lat (ms) L Amp (mV) R Amp (mV) L-R Amp (%) Site1 Site2 L Vel (m/s) R Vel (m/s) L-R Vel (m/s)  Median Motor (Abd Poll Brev)  32.6C  Wrist *4.4   *3.7   Elbow Wrist *46    Elbow 9.4   3.7         Ulnar Motor (Abd Dig Min)  32.5C  Wrist *4.7   3.3   B Elbow Wrist *50    B Elbow 8.7   1.6   A Elbow B Elbow *48    A Elbow 10.8   2.1            Waveforms:             Clinical History: No specialty comments available.     Objective:  VS:  HT:    WT:    BMI:     BP:  HR: bpm  TEMP: ( )  RESP:  Physical Exam   Imaging: No results found.

## 2022-07-07 ENCOUNTER — Ambulatory Visit: Payer: Medicare Other | Admitting: Orthopaedic Surgery

## 2022-07-07 DIAGNOSIS — G5602 Carpal tunnel syndrome, left upper limb: Secondary | ICD-10-CM

## 2022-07-07 NOTE — Progress Notes (Signed)
Office Visit Note   Patient: Larry [name]           Date of Birth: 08-06-1951           MRN: [id number] Visit Date: 07/07/2022              Requested by: Larry [name], [address] [place],  [place] [address] PCP: Larry Seashore, MD   Assessment & Plan: Visit Diagnoses:  1. Left carpal tunnel syndrome     Plan: Impression is severe left carpal tunnel syndrome.  He has asymptomatic Guyon tunnel syndrome.  EMGs were reviewed with the patient and based on findings I recommended left carpal tunnel release.  He is not symptomatic from the Guyon's tunnel compression therefore we will leave this alone.  Details of the surgery reviewed with the patient including risk benefits prognosis.  Questions encouraged and answered.  Larry [name] will meet with the patient today to schedule surgery.  Follow-Up Instructions: No follow-ups on file.   Orders:  No orders of the defined types were placed in this encounter.  No orders of the defined types were placed in this encounter.     Procedures: No procedures performed   Clinical Data: No additional findings.   Subjective: Chief Complaint  Patient presents with   Left Hand - Follow-up    HPI Larry [name] returns today to review nerve conduction studies. Review of Systems   Objective: Vital Signs: There were no vitals taken for this visit.  Physical Exam  Ortho Exam Examination of the left hand shows mild thenar flattening.  Positive carpal tunnel compressive signs.  He has no atrophy of the intrinsics. Specialty Comments:  No specialty comments available.  Imaging: No results found.   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 05/07/2020   Osteoarthritis of left AC (acromioclavicular) joint 05/07/2020   Impingement syndrome of left shoulder 02/08/2020   S/P TKR (total knee replacement) using cement, left 10/03/2019   Cellulitis of arm 08/28/2019   Essential hypertension 02/16/2018   Sepsis due to skin infection ([place]) 02/16/2018   Chronic pain 02/16/2018   Cellulitis of left lower extremity    Lumbar radiculopathy, chronic 01/24/2018   Lumbar stenosis with neurogenic claudication 03/23/2017   Hypothyroidism 07/25/2015   Primary osteoarthritis of right knee 07/23/2015   Primary osteoarthritis of knee 07/23/2015   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 ago, just gets jittery   Hypothyroidism  Lumbar stenosis    MRSA (methicillin resistant staph aureus) culture positive 09/10/2019   upper arm   OSA (obstructive sleep apnea)    "haven't wore mask in years; had OR; still snore" (04/12/2013)   Urinary frequency     No family history on file.  Past Surgical History:  Procedure Laterality Date   BACK SURGERY     CARPOMETACARPEL SUSPENSION PLASTY Left 07/19/2020   Procedure: LEFT THUMB CARPOMETACARPAL ([place]) ARTHROPLASTY;  Surgeon: Leandrew Koyanagi, MD;  Location: [facility];  Service: Orthopedics;  Laterality: Left;   CERVICAL FUSION  2002; 2012    CYSTECTOMY     INGUINAL HERNIA REPAIR Bilateral 1997   KNEE ARTHROSCOPY Left ~ 1997; ~ 2005   LUMBAR FUSION  2007; 04/11/2013   MICROLARYNGOSCOPY N/A 03/07/2021   Procedure: DIRECT MICROLARYNGOSCOPY WITH BIOPSY;  Surgeon: Rozetta Nunnery, MD;  Location: [facility];  Service: ENT;  Laterality: N/A;   PENILE DEBRIDEMENT  ~ 2011   X 3  FOR MRSA INFECTION     SHOULDER ARTHROSCOPY Left ~ 2003   TONSILLECTOMY  1990's   TOTAL KNEE ARTHROPLASTY Right 07/23/2015   TOTAL KNEE ARTHROPLASTY Right 07/23/2015   Procedure: TOTAL KNEE ARTHROPLASTY;  Surgeon: Garald Balding, MD;  Location: [facility];  Service: Orthopedics;  Laterality: Right;   TOTAL KNEE ARTHROPLASTY Left 10/03/2019   Procedure: LEFT TOTAL KNEE ARTHROPLASTY;  Surgeon: Garald Balding, MD;  Location: [facility];  Service: Orthopedics;  Laterality: Left;   UVULOPALATOPHARYNGOPLASTY (UPPP)/TONSILLECTOMY/SEPTOPLASTY  1990's   VASECTOMY     Social History   Occupational History   Not on file  Tobacco Use   Smoking status: Former    Packs/day: 0.50    Years: 15.00    Total pack years: 7.50    Types: Cigarettes    Quit date: 10/14/1986    Years since quitting: 35.7   Smokeless tobacco: Never  Vaping Use   Vaping Use: Never used  Substance and Sexual Activity   Alcohol use: Yes    Comment: occasional   Drug use: No   Sexual activity: Yes

## 2022-07-16 ENCOUNTER — Telehealth: Payer: Self-pay | Admitting: Orthopaedic Surgery

## 2022-07-16 NOTE — Telephone Encounter (Signed)
No problems from my end.  Thanks.

## 2022-07-16 NOTE — Telephone Encounter (Signed)
Patient is scheduled for LEFT CARPAL TUNNEL RELEASE on 07-30-22.  Please see the following message sent by e-mail from the [facility].  Patient has also called and left a message needing to know if the surgery needs to be rescheduled.     Debbie,  Please call Larry [name] of Dr. Erlinda Hong, who is scheduled for surgery on 07/30/22.  Patient had a question regarding injections to back prior to surgery and wanted to know if it will interfere with his upcoming surgery.  Anesthesia does not have any problems with him getting that done so he just needs an answer from your office.  Please call [phone] to let him know ASAP.  Thank you.  Felicita Gage RN BSN (Pre-op Assessment) [phone] ext [phone] Fax [phone] [email or web address]  [place] [address]

## 2022-07-21 DIAGNOSIS — L820 Inflamed seborrheic keratosis: Secondary | ICD-10-CM | POA: Diagnosis not present

## 2022-07-21 DIAGNOSIS — D225 Melanocytic nevi of trunk: Secondary | ICD-10-CM | POA: Diagnosis not present

## 2022-07-21 DIAGNOSIS — E039 Hypothyroidism, unspecified: Secondary | ICD-10-CM | POA: Diagnosis not present

## 2022-07-21 DIAGNOSIS — Z1283 Encounter for screening for malignant neoplasm of skin: Secondary | ICD-10-CM | POA: Diagnosis not present

## 2022-07-22 DIAGNOSIS — G5601 Carpal tunnel syndrome, right upper limb: Secondary | ICD-10-CM | POA: Diagnosis not present

## 2022-07-22 DIAGNOSIS — M5459 Other low back pain: Secondary | ICD-10-CM | POA: Diagnosis not present

## 2022-07-22 DIAGNOSIS — M79671 Pain in right foot: Secondary | ICD-10-CM | POA: Diagnosis not present

## 2022-07-22 DIAGNOSIS — M542 Cervicalgia: Secondary | ICD-10-CM | POA: Diagnosis not present

## 2022-07-23 DIAGNOSIS — S2242XA Multiple fractures of ribs, left side, initial encounter for closed fracture: Secondary | ICD-10-CM | POA: Diagnosis not present

## 2022-07-24 ENCOUNTER — Other Ambulatory Visit: Payer: Self-pay | Admitting: Physician Assistant

## 2022-07-24 MED ORDER — ONDANSETRON HCL 4 MG PO TABS: 4.0000 mg | ORAL_TABLET | Freq: Three times a day (TID) | ORAL | 0 refills | Status: AC | PRN

## 2022-07-24 MED ORDER — HYDROCODONE-ACETAMINOPHEN 5-325 MG PO TABS: 1.0000 | ORAL_TABLET | Freq: Three times a day (TID) | ORAL | 0 refills | Status: AC | PRN

## 2022-07-26 DIAGNOSIS — G4733 Obstructive sleep apnea (adult) (pediatric): Secondary | ICD-10-CM | POA: Diagnosis not present

## 2022-07-28 ENCOUNTER — Ambulatory Visit: Payer: Medicare Other | Admitting: Orthopaedic Surgery

## 2022-07-28 ENCOUNTER — Ambulatory Visit (INDEPENDENT_AMBULATORY_CARE_PROVIDER_SITE_OTHER): Payer: Medicare Other

## 2022-07-28 DIAGNOSIS — G5602 Carpal tunnel syndrome, left upper limb: Secondary | ICD-10-CM

## 2022-07-28 DIAGNOSIS — M79641 Pain in right hand: Secondary | ICD-10-CM

## 2022-07-28 NOTE — Progress Notes (Signed)
Office Visit Note   Patient: Larry [name]           Date of Birth: 09/03/1951           MRN: [id number] Visit Date: 07/28/2022              Requested by: Merrilee Seashore, [address] [place],  [place] [address] PCP: Merrilee Seashore, MD   Assessment & Plan: Visit Diagnoses:  1. Left carpal tunnel syndrome   2. Pain in right hand     Plan: Impression is right hand pain and numbness suspicious for carpal tunnel syndrome.  We will send patient for nerve conduction studies.  Follow-up afterwards.  Follow-Up Instructions: No follow-ups on file.   Orders:  Orders Placed This Encounter  Procedures   XR Hand Complete Right   No orders of the defined types were placed in this encounter.     Procedures: No procedures performed   Clinical Data: No additional findings.   Subjective: Chief Complaint  Patient presents with   Right Hand - Pain    HPI Larry [name] returns today for evaluation of right hand pain and numbness.  He feels this in his index and long fingers.  He feels that the symptoms are reminiscent of carpal tunnel syndrome which she has in his left hand.  Review of Systems   Objective: Vital Signs: There were no vitals taken for this visit.  Physical Exam  Ortho Exam Examination of the right hand shows no muscle atrophy.  Positive carpal tunnel compressive signs.  Thenar muscle function intact. Specialty Comments:  No specialty comments available.  Imaging: No results found.   PMFS History: Patient Active Problem List   Diagnosis Date Noted   Left carpal tunnel syndrome 07/07/2022   Acute thigh pain, right 05/01/2021   CMC (carpometacarpal joint) dislocation, left, initial encounter 11/12/2020   Post-operative state 08/20/2020   Nontraumatic incomplete tear of left rotator cuff 08/20/2020   AC (acromioclavicular) arthritis 08/20/2020   Complete tear of left rotator cuff 07/09/2020   Osteoarthritis of first  carpometacarpal [facility]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 05/07/2020   Osteoarthritis of left AC (acromioclavicular) joint 05/07/2020   Impingement syndrome of left shoulder 02/08/2020   S/P TKR (total knee replacement) using cement, left 10/03/2019   Cellulitis of arm 08/28/2019   Essential hypertension 02/16/2018   Sepsis due to skin infection ([place]) 02/16/2018   Chronic pain 02/16/2018   Cellulitis of left lower extremity    Lumbar radiculopathy, chronic 01/24/2018   Lumbar stenosis with neurogenic claudication 03/23/2017   Hypothyroidism 07/25/2015   Primary osteoarthritis of right knee 07/23/2015   Primary osteoarthritis of knee 07/23/2015   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 ago, just gets jittery   Hypothyroidism    Lumbar stenosis    MRSA (methicillin resistant staph aureus) culture positive 09/10/2019  upper arm   OSA (obstructive sleep apnea)    "haven't wore mask in years; had OR; still snore" (04/12/2013)   Urinary frequency     No family history on file.  Past Surgical History:  Procedure Laterality Date   BACK SURGERY     CARPOMETACARPEL SUSPENSION PLASTY Left 07/19/2020   Procedure: LEFT THUMB CARPOMETACARPAL ([place]) ARTHROPLASTY;  Surgeon: Leandrew Koyanagi, MD;  Location: [facility];  Service: Orthopedics;  Laterality: Left;   CERVICAL FUSION  2002; 2012   CYSTECTOMY     INGUINAL HERNIA REPAIR Bilateral 1997   KNEE ARTHROSCOPY Left ~ 1997; ~ 2005   LUMBAR FUSION  2007;  04/11/2013   MICROLARYNGOSCOPY N/A 03/07/2021   Procedure: DIRECT MICROLARYNGOSCOPY WITH BIOPSY;  Surgeon: Rozetta Nunnery, MD;  Location: [facility];  Service: ENT;  Laterality: N/A;   PENILE DEBRIDEMENT  ~ 2011   X 3  FOR MRSA INFECTION     SHOULDER ARTHROSCOPY Left ~ 2003   TONSILLECTOMY  1990's   TOTAL KNEE ARTHROPLASTY Right 07/23/2015   TOTAL KNEE ARTHROPLASTY Right 07/23/2015   Procedure: TOTAL KNEE ARTHROPLASTY;  Surgeon: Garald Balding, MD;  Location: [facility];  Service: Orthopedics;  Laterality: Right;   TOTAL KNEE ARTHROPLASTY Left 10/03/2019   Procedure: LEFT TOTAL KNEE ARTHROPLASTY;  Surgeon: Garald Balding, MD;  [facility];  Service: Orthopedics;  Laterality: Left;   UVULOPALATOPHARYNGOPLASTY (UPPP)/TONSILLECTOMY/SEPTOPLASTY  1990's   VASECTOMY     Social History   Occupational History   Not on file  Tobacco Use   Smoking status: Former    Packs/day: 0.50    Years: 15.00    Total pack years: 7.50    Types: Cigarettes    Quit date: 10/14/1986    Years since quitting: 35.8   Smokeless tobacco: Never  Vaping Use   Vaping Use: Never used  Substance and Sexual Activity   Alcohol use: Yes    Comment: occasional   Drug use: No   Sexual activity: Yes

## 2022-07-29 DIAGNOSIS — R7303 Prediabetes: Secondary | ICD-10-CM | POA: Diagnosis not present

## 2022-07-29 DIAGNOSIS — I10 Essential (primary) hypertension: Secondary | ICD-10-CM | POA: Diagnosis not present

## 2022-07-29 DIAGNOSIS — Z79899 Other long term (current) drug therapy: Secondary | ICD-10-CM | POA: Diagnosis not present

## 2022-07-29 DIAGNOSIS — D696 Thrombocytopenia, unspecified: Secondary | ICD-10-CM | POA: Diagnosis not present

## 2022-07-29 DIAGNOSIS — E291 Testicular hypofunction: Secondary | ICD-10-CM | POA: Diagnosis not present

## 2022-07-29 DIAGNOSIS — E7800 Pure hypercholesterolemia, unspecified: Secondary | ICD-10-CM | POA: Diagnosis not present

## 2022-07-30 ENCOUNTER — Other Ambulatory Visit: Payer: Self-pay | Admitting: Physician Assistant

## 2022-07-30 ENCOUNTER — Telehealth: Payer: Self-pay | Admitting: Orthopaedic Surgery

## 2022-07-30 DIAGNOSIS — G5602 Carpal tunnel syndrome, left upper limb: Secondary | ICD-10-CM | POA: Diagnosis not present

## 2022-07-30 NOTE — Telephone Encounter (Signed)
Disregard message

## 2022-07-30 NOTE — Telephone Encounter (Signed)
Forget this message

## 2022-07-30 NOTE — Telephone Encounter (Signed)
Pt called and states that he needs his pain medicine to be sent to [facility] on [address]. Med were never sent in.

## 2022-08-04 ENCOUNTER — Ambulatory Visit (INDEPENDENT_AMBULATORY_CARE_PROVIDER_SITE_OTHER): Payer: Medicare Other | Admitting: Physical Medicine and Rehabilitation

## 2022-08-04 DIAGNOSIS — R202 Paresthesia of skin: Secondary | ICD-10-CM | POA: Diagnosis not present

## 2022-08-04 NOTE — Progress Notes (Signed)
Numeric Pain Rating Scale and Functional Assessment Average Pain 3   In the last MONTH (on 0-10 scale) has pain interfered with the following?  1. General activity like being  able to carry out your everyday physical activities such as walking, climbing stairs, carrying groceries, or moving a chair?  Rating(9)   Right handed. Right hand pain and numbness with some swelling

## 2022-08-04 NOTE — Progress Notes (Signed)
Larry [name] - 71 y.o. male MRN [id number]  Date of birth: 02-16-1951  Office Visit Note: Visit Date: 08/04/2022 PCP: Merrilee Seashore, MD Referred by: Leandrew Koyanagi, MD  Subjective: Chief Complaint  Patient presents with   Right Wrist - Pain   HPI:  Larry [name] is a 71 y.o. male who comes in today at the request of Dr. Eduard Roux for electrodiagnostic study of the Right upper extremities.  Patient is Right hand dominant.  He is status post recent left carpal tunnel release and had electrodiagnostic study in our office showing fairly significant nerve compression.  After surgery he started to complain of right-sided symptoms.  Was not having much in the right-sided symptoms and we saw him at the other visit.  He is having mostly hand pain with movement and activity some swelling but also some numbness particularly in the first 3 digits.  Rates his pain as 3 out of 10.   ROS Otherwise per HPI.  Assessment & Plan: Visit Diagnoses:    ICD-10-CM   1. Paresthesia of skin  R20.2 NCV with EMG (electromyography)      Plan: Impression: Essentially NORMAL electrodiagnostic study of the right upper limb.  There is no significant electrodiagnostic evidence of nerve entrapment, brachial plexopathy or cervical radiculopathy.    As you know, purely sensory or demyelinating radiculopathies and chemical radiculitis may not be detected with this particular electrodiagnostic study.  Recommendations: 1.  Follow-up with referring physician. 2.  Continue current management of symptoms.  Meds & Orders: No orders of the defined types were placed in this encounter.   Orders Placed This Encounter  Procedures   NCV with EMG (electromyography)    Follow-up: Return in about 2 weeks (around 08/18/2022) for  Eduard Roux, MD.   Procedures: No procedures performed  EMG & NCV Findings: Evaluation of the right median motor and the right ulnar sensory nerves showed reduced amplitude (R3.9,  R12.0 V).  All remaining nerves (as indicated in the following tables) were within normal limits.    All examined muscles (as indicated in the following table) showed no evidence of electrical instability.    Impression: Essentially NORMAL electrodiagnostic study of the right upper limb.  There is no significant electrodiagnostic evidence of nerve entrapment, brachial plexopathy or cervical radiculopathy.    As you know, purely sensory or demyelinating radiculopathies and chemical radiculitis may not be detected with this particular electrodiagnostic study.  Recommendations: 1.  Follow-up with referring physician. 2.  Continue current management of symptoms.  ___________________________ [name] FAAPMR Board Certified, American Board of Physical Medicine and Rehabilitation    Nerve Conduction Studies Anti Sensory Summary Table   Stim Site NR Peak (ms) Norm Peak (ms) P-T Amp (V) Norm P-T Amp Site1 Site2 Delta-P (ms) Dist (cm) Vel (m/s) Norm Vel (m/s)  Right Median Acr Palm Anti Sensory (2nd Digit)  30.5C  Wrist    3.6 <3.6 22.5 >10 Wrist Palm 1.8 0.0    Palm    1.8 <2.0 13.7         Right Radial Anti Sensory (Base 1st Digit)  30.8C  Wrist    2.4 <3.1 22.6  Wrist Base 1st Digit 2.4 0.0    Right Ulnar Anti Sensory (5th Digit)  30.9C  Wrist    3.6 <3.7 *12.0 >15.0 Wrist 5th Digit 3.6 14.0 39 >38   Motor Summary Table   Stim Site NR Onset (ms) Norm Onset (ms) O-P Amp (mV) Norm O-P Amp Site1  Site2 Delta-0 (ms) Dist (cm) Vel (m/s) Norm Vel (m/s)  Right Median Motor (Abd Poll Brev)  30.9C  Wrist    3.7 <4.2 *3.9 >5 Elbow Wrist 4.4 22.0 50 >50  Elbow    8.1  3.1         Right Ulnar Motor (Abd Dig Min)  31C  Wrist    3.4 <4.2 7.8 >3 B Elbow Wrist 3.9 21.0 54 >53  B Elbow    7.3  6.8  A Elbow B Elbow 1.5 11.0 73 >53  A Elbow    8.8  6.5          EMG   Side Muscle Nerve Root Ins Act Fibs Psw Amp Dur Poly Recrt Int Fraser Din Comment  Right Abd Poll Brev Median C8-T1 Nml Nml Nml  Nml Nml 0 Nml Nml   Right 1stDorInt Ulnar C8-T1 Nml Nml Nml Nml Nml 0 Nml Nml   Right PronatorTeres Median C6-7 Nml Nml Nml Nml Nml 0 Nml Nml   Right Biceps Musculocut C5-6 Nml Nml Nml Nml Nml 0 Nml Nml   Right Deltoid Axillary C5-6 Nml Nml Nml Nml Nml 0 Nml Nml     Nerve Conduction Studies Anti Sensory Left/Right Comparison   Stim Site L Lat (ms) R Lat (ms) L-R Lat (ms) L Amp (V) R Amp (V) L-R Amp (%) Site1 Site2 L Vel (m/s) R Vel (m/s) L-R Vel (m/s)  Median Acr Palm Anti Sensory (2nd Digit)  30.5C  Wrist  3.6   22.5  Wrist Palm     Palm  1.8   13.7        Radial Anti Sensory (Base 1st Digit)  30.8C  Wrist  2.4   22.6  Wrist Base 1st Digit     Ulnar Anti Sensory (5th Digit)  30.9C  Wrist  3.6   *12.0  Wrist 5th Digit  39    Motor Left/Right Comparison   Stim Site L Lat (ms) R Lat (ms) L-R Lat (ms) L Amp (mV) R Amp (mV) L-R Amp (%) Site1 Site2 L Vel (m/s) R Vel (m/s) L-R Vel (m/s)  Median Motor (Abd Poll Brev)  30.9C  Wrist  3.7   *3.9  Elbow Wrist  50   Elbow  8.1   3.1        Ulnar Motor (Abd Dig Min)  31C  Wrist  3.4   7.8  B Elbow Wrist  54   B Elbow  7.3   6.8  A Elbow B Elbow  73   A Elbow  8.8   6.5           Waveforms:             Clinical History: No specialty comments available.     Objective:  VS:  HT:    WT:   BMI:     BP:   HR: bpm  TEMP: ( )  RESP:  Physical Exam Musculoskeletal:        General: No tenderness.     Comments: Inspection reveals no atrophy of the right APB or FDI or hand intrinsics. There is no swelling, color changes, allodynia or dystrophic changes. There is 5 out of 5 strength in the right wrist extension, finger abduction and long finger flexion. There is intact sensation to light touch in all dermatomal and peripheral nerve distributions. There is a negative Hoffmann's test on the right.  Patient's left hand is bandaged.  Skin:    General: Skin  is warm and dry.     Findings: No erythema or rash.  Neurological:      General: No focal deficit present.     Mental Status: He is alert and oriented to person, place, and time.     Sensory: No sensory deficit.     Motor: No weakness or abnormal muscle tone.     Coordination: Coordination normal.     Gait: Gait normal.  Psychiatric:        Mood and Affect: Mood normal.        Behavior: Behavior normal.        Thought Content: Thought content normal.      Imaging: No results found.

## 2022-08-04 NOTE — Procedures (Signed)
EMG & NCV Findings: Evaluation of the right median motor and the right ulnar sensory nerves showed reduced amplitude (R3.9, R12.0 V).  All remaining nerves (as indicated in the following tables) were within normal limits.    All examined muscles (as indicated in the following table) showed no evidence of electrical instability.    Impression: Essentially NORMAL electrodiagnostic study of the right upper limb.  There is no significant electrodiagnostic evidence of nerve entrapment, brachial plexopathy or cervical radiculopathy.    As you know, purely sensory or demyelinating radiculopathies and chemical radiculitis may not be detected with this particular electrodiagnostic study.  Recommendations: 1.  Follow-up with referring physician. 2.  Continue current management of symptoms.  ___________________________ [name] FAAPMR Board Certified, American Board of Physical Medicine and Rehabilitation    Nerve Conduction Studies Anti Sensory Summary Table   Stim Site NR Peak (ms) Norm Peak (ms) P-T Amp (V) Norm P-T Amp Site1 Site2 Delta-P (ms) Dist (cm) Vel (m/s) Norm Vel (m/s)  Right Median Acr Palm Anti Sensory (2nd Digit)  30.5C  Wrist    3.6 <3.6 22.5 >10 Wrist Palm 1.8 0.0    Palm    1.8 <2.0 13.7         Right Radial Anti Sensory (Base 1st Digit)  30.8C  Wrist    2.4 <3.1 22.6  Wrist Base 1st Digit 2.4 0.0    Right Ulnar Anti Sensory (5th Digit)  30.9C  Wrist    3.6 <3.7 *12.0 >15.0 Wrist 5th Digit 3.6 14.0 39 >38   Motor Summary Table   Stim Site NR Onset (ms) Norm Onset (ms) O-P Amp (mV) Norm O-P Amp Site1 Site2 Delta-0 (ms) Dist (cm) Vel (m/s) Norm Vel (m/s)  Right Median Motor (Abd Poll Brev)  30.9C  Wrist    3.7 <4.2 *3.9 >5 Elbow Wrist 4.4 22.0 50 >50  Elbow    8.1  3.1         Right Ulnar Motor (Abd Dig Min)  31C  Wrist    3.4 <4.2 7.8 >3 B Elbow Wrist 3.9 21.0 54 >53  B Elbow    7.3  6.8  A Elbow B Elbow 1.5 11.0 73 >53  A Elbow    8.8  6.5          EMG    Side Muscle Nerve Root Ins Act Fibs Psw Amp Dur Poly Recrt Int Fraser Din Comment  Right Abd Poll Brev Median C8-T1 Nml Nml Nml Nml Nml 0 Nml Nml   Right 1stDorInt Ulnar C8-T1 Nml Nml Nml Nml Nml 0 Nml Nml   Right PronatorTeres Median C6-7 Nml Nml Nml Nml Nml 0 Nml Nml   Right Biceps Musculocut C5-6 Nml Nml Nml Nml Nml 0 Nml Nml   Right Deltoid Axillary C5-6 Nml Nml Nml Nml Nml 0 Nml Nml     Nerve Conduction Studies Anti Sensory Left/Right Comparison   Stim Site L Lat (ms) R Lat (ms) L-R Lat (ms) L Amp (V) R Amp (V) L-R Amp (%) Site1 Site2 L Vel (m/s) R Vel (m/s) L-R Vel (m/s)  Median Acr Palm Anti Sensory (2nd Digit)  30.5C  Wrist  3.6   22.5  Wrist Palm     Palm  1.8   13.7        Radial Anti Sensory (Base 1st Digit)  30.8C  Wrist  2.4   22.6  Wrist Base 1st Digit     Ulnar Anti Sensory (5th Digit)  30.9C  Wrist  3.6   *12.0  Wrist 5th Digit  39    Motor Left/Right Comparison   Stim Site L Lat (ms) R Lat (ms) L-R Lat (ms) L Amp (mV) R Amp (mV) L-R Amp (%) Site1 Site2 L Vel (m/s) R Vel (m/s) L-R Vel (m/s)  Median Motor (Abd Poll Brev)  30.9C  Wrist  3.7   *3.9  Elbow Wrist  50   Elbow  8.1   3.1        Ulnar Motor (Abd Dig Min)  31C  Wrist  3.4   7.8  B Elbow Wrist  54   B Elbow  7.3   6.8  A Elbow B Elbow  73   A Elbow  8.8   6.5           Waveforms:

## 2022-08-06 ENCOUNTER — Encounter: Payer: Self-pay | Admitting: Orthopaedic Surgery

## 2022-08-06 ENCOUNTER — Ambulatory Visit (INDEPENDENT_AMBULATORY_CARE_PROVIDER_SITE_OTHER): Payer: Medicare Other | Admitting: Orthopaedic Surgery

## 2022-08-06 DIAGNOSIS — E039 Hypothyroidism, unspecified: Secondary | ICD-10-CM | POA: Diagnosis not present

## 2022-08-06 DIAGNOSIS — I10 Essential (primary) hypertension: Secondary | ICD-10-CM | POA: Diagnosis not present

## 2022-08-06 DIAGNOSIS — R7303 Prediabetes: Secondary | ICD-10-CM | POA: Diagnosis not present

## 2022-08-06 DIAGNOSIS — G5602 Carpal tunnel syndrome, left upper limb: Secondary | ICD-10-CM

## 2022-08-06 DIAGNOSIS — E291 Testicular hypofunction: Secondary | ICD-10-CM | POA: Diagnosis not present

## 2022-08-06 NOTE — Progress Notes (Signed)
Post-Op Visit Note   Patient: Larry [name]           Date of Birth: 11/15/1950           MRN: [id number] Visit Date: 08/06/2022 PCP: Merrilee Seashore, MD   Assessment & Plan:  Chief Complaint:  Chief Complaint  Patient presents with   Left Wrist - Follow-up    Left carpal tunnel release 07/30/2022   Visit Diagnoses:  1. Left carpal tunnel syndrome     Plan: [name] is at 1 week status post left carpal tunnel release.  He is doing well in that regard.  Nerve conduction studies on his right hand which were negative for carpal tunnel syndrome.  Examination left hand shows intact sutures and incision.  No signs of infection.  Neurovascular intact.  Band-Aid applied and we gave him a carpal tunnel brace to use as needed.  Explained that his carpal tunnel syndrome could be too mild to detect on EMGs.  He will just watch for now.  He will come back next week for suture removal from the left hand.  Follow-Up Instructions: Return in about 1 week (around 08/13/2022).   Orders:  No orders of the defined types were placed in this encounter.  No orders of the defined types were placed in this encounter.   Imaging: No results found.  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 05/07/2020   Osteoarthritis of left AC (acromioclavicular) joint 05/07/2020   Impingement syndrome of left shoulder 02/08/2020   S/P TKR (total knee replacement) using cement, left 10/03/2019   Cellulitis of arm 08/28/2019   Essential hypertension 02/16/2018   Sepsis due  to skin infection ([place]) 02/16/2018   Chronic pain 02/16/2018   Cellulitis of left lower extremity    Lumbar radiculopathy, chronic 01/24/2018   Lumbar stenosis with neurogenic claudication 03/23/2017   Hypothyroidism 07/25/2015   Primary osteoarthritis of right knee 07/23/2015   Primary osteoarthritis of knee 07/23/2015   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 ago, just gets jittery   Hypothyroidism    Lumbar stenosis    MRSA (methicillin resistant staph aureus) culture positive 09/10/2019   upper arm   OSA (obstructive sleep apnea)    "haven't wore mask in years; had OR; still snore" (04/12/2013)   Urinary frequency     No family history on file.  Past Surgical History:  Procedure Laterality Date   BACK SURGERY     CARPOMETACARPEL  SUSPENSION PLASTY Left 07/19/2020   Procedure: LEFT THUMB CARPOMETACARPAL ([place]) ARTHROPLASTY;  Surgeon: Leandrew Koyanagi, MD;  Location: [place];  Service: Orthopedics;  Laterality: Left;   CERVICAL FUSION  2002; 2012   CYSTECTOMY     INGUINAL HERNIA REPAIR Bilateral 1997   KNEE ARTHROSCOPY Left ~ 1997; ~ 2005   LUMBAR FUSION  2007; 04/11/2013   MICROLARYNGOSCOPY N/A 03/07/2021   Procedure: DIRECT MICROLARYNGOSCOPY WITH BIOPSY;  Surgeon: Rozetta Nunnery, MD;  Location: [place];  Service: ENT;  Laterality: N/A;   PENILE DEBRIDEMENT  ~ 2011   X 3  FOR MRSA INFECTION     SHOULDER ARTHROSCOPY Left ~ 2003   TONSILLECTOMY   1990's   TOTAL KNEE ARTHROPLASTY Right 07/23/2015   TOTAL KNEE ARTHROPLASTY Right 07/23/2015   Procedure: TOTAL KNEE ARTHROPLASTY;  Surgeon: Garald Balding, MD;  Location: [place];  Service: Orthopedics;  Laterality: Right;   TOTAL KNEE ARTHROPLASTY Left 10/03/2019   Procedure: LEFT TOTAL KNEE ARTHROPLASTY;  Surgeon: Garald Balding, MD;  Location: [facility];  Service: Orthopedics;  Laterality: Left;   UVULOPALATOPHARYNGOPLASTY (UPPP)/TONSILLECTOMY/SEPTOPLASTY  1990's   VASECTOMY     Social History   Occupational History   Not on file  Tobacco Use   Smoking status: Former    Packs/day: 0.50    Years: 15.00    Total pack years: 7.50    Types: Cigarettes    Quit date: 10/14/1986    Years since quitting: 35.8   Smokeless tobacco: Never  Vaping Use   Vaping Use: Never used  Substance and Sexual Activity   Alcohol use: Yes    Comment: occasional   Drug use: No   Sexual activity: Yes

## 2022-08-12 DIAGNOSIS — R011 Cardiac murmur, unspecified: Secondary | ICD-10-CM | POA: Diagnosis not present

## 2022-08-13 ENCOUNTER — Encounter: Payer: Self-pay | Admitting: Orthopaedic Surgery

## 2022-08-13 ENCOUNTER — Ambulatory Visit (INDEPENDENT_AMBULATORY_CARE_PROVIDER_SITE_OTHER): Payer: Medicare Other | Admitting: Physician Assistant

## 2022-08-13 DIAGNOSIS — G5602 Carpal tunnel syndrome, left upper limb: Secondary | ICD-10-CM

## 2022-08-13 DIAGNOSIS — Z9889 Other specified postprocedural states: Secondary | ICD-10-CM

## 2022-08-13 NOTE — Progress Notes (Signed)
Post-Op Visit Note   Patient: Larry [name]           Date of Birth: 07-29-1951           MRN: [id number] Visit Date: 08/13/2022 PCP: Merrilee Seashore, MD   Assessment & Plan:  Chief Complaint:  Chief Complaint  Patient presents with   Left Wrist - Follow-up    Carpal tunnel release 07/30/2022   Visit Diagnoses:  1. Carpal tunnel syndrome, left upper limb   2. History of carpal tunnel release     Plan: Patient is a very pleasant 71 year old gentleman who comes in today 2 weeks status post left carpal tunnel release 07/30/2022.  He is doing well.  No pain or paresthesias.  Examination of his left hand reveals a fully healed surgical scar with nylon sutures in place.  No evidence of infection or cellulitis.  Fingers warm well perfused.  Today, sutures were removed and Steri-Strips applied.  No heavy lifting or submerging his hand underwater for another 2 weeks.  He will continue with range of motion exercises.  Follow-up with [place] in 4 weeks for recheck.  Call with concerns or questions.  Follow-Up Instructions: Return in about 4 weeks (around 09/10/2022).   Orders:  No orders of the defined types were placed in this encounter.  No orders of the defined types were placed in this encounter.   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 05/07/2020   Osteoarthritis of left AC (acromioclavicular) joint 05/07/2020   Impingement syndrome of left shoulder 02/08/2020   S/P TKR (total knee replacement) using cement, left  10/03/2019   Cellulitis of arm 08/28/2019   Essential hypertension 02/16/2018   Sepsis due to skin infection ([place]) 02/16/2018   Chronic pain 02/16/2018   Cellulitis of left lower extremity    Lumbar radiculopathy, chronic 01/24/2018   Lumbar stenosis with neurogenic claudication 03/23/2017   Hypothyroidism 07/25/2015   Primary osteoarthritis of right knee 07/23/2015   Primary osteoarthritis of knee 07/23/2015   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 ago, just gets jittery   Hypothyroidism    Lumbar stenosis    MRSA (methicillin resistant staph aureus) culture positive 09/10/2019   upper arm   OSA (obstructive sleep apnea)    "haven't wore mask in years; had OR; still snore" (04/12/2013)   Urinary frequency     No family history on file.  Past Surgical History:  Procedure  Laterality Date   BACK SURGERY     CARPOMETACARPEL SUSPENSION PLASTY Left 07/19/2020   Procedure: LEFT THUMB CARPOMETACARPAL ([place]) ARTHROPLASTY;  Surgeon: Leandrew Koyanagi, MD;  Location: [place];  Service: Orthopedics;  Laterality: Left;   CERVICAL FUSION  2002; 2012   CYSTECTOMY     INGUINAL HERNIA REPAIR Bilateral 1997   KNEE ARTHROSCOPY Left ~ 1997; ~ 2005   LUMBAR FUSION  2007; 04/11/2013   MICROLARYNGOSCOPY N/A 03/07/2021   Procedure: DIRECT MICROLARYNGOSCOPY WITH BIOPSY;  Surgeon: Rozetta Nunnery, MD;  Location: [place];  Service: ENT;  Laterality: N/A;   PENILE DEBRIDEMENT   ~ 2011   X 3  FOR MRSA INFECTION     SHOULDER ARTHROSCOPY Left ~ 2003   TONSILLECTOMY  1990's   TOTAL KNEE ARTHROPLASTY Right 07/23/2015   TOTAL KNEE ARTHROPLASTY Right 07/23/2015   Procedure: TOTAL KNEE ARTHROPLASTY;  Surgeon: Garald Balding, MD;  Location: [place];  Service: Orthopedics;  Laterality: Right;   TOTAL KNEE ARTHROPLASTY Left 10/03/2019   Procedure: LEFT TOTAL KNEE ARTHROPLASTY;  Surgeon: Garald Balding, MD;  Location[facility];  Service: Orthopedics;  Laterality: Left;   UVULOPALATOPHARYNGOPLASTY (UPPP)/TONSILLECTOMY/SEPTOPLASTY  1990's   VASECTOMY     Social History   Occupational History   Not on file  Tobacco Use   Smoking status: Former    Packs/day: 0.50    Years: 15.00    Total pack years: 7.50    Types: Cigarettes    Quit date: 10/14/1986    Years since quitting: 35.8   Smokeless tobacco: Never  Vaping Use   Vaping Use: Never used  Substance and Sexual Activity   Alcohol use: Yes    Comment: occasional   Drug use: No   Sexual activity: Yes

## 2022-08-17 ENCOUNTER — Telehealth: Payer: Self-pay | Admitting: Hematology and Oncology

## 2022-08-17 DIAGNOSIS — I10 Essential (primary) hypertension: Secondary | ICD-10-CM | POA: Diagnosis not present

## 2022-08-17 DIAGNOSIS — E291 Testicular hypofunction: Secondary | ICD-10-CM | POA: Diagnosis not present

## 2022-08-17 DIAGNOSIS — R7303 Prediabetes: Secondary | ICD-10-CM | POA: Diagnosis not present

## 2022-08-17 DIAGNOSIS — Z79899 Other long term (current) drug therapy: Secondary | ICD-10-CM | POA: Diagnosis not present

## 2022-08-17 DIAGNOSIS — E039 Hypothyroidism, unspecified: Secondary | ICD-10-CM | POA: Diagnosis not present

## 2022-08-17 NOTE — Telephone Encounter (Signed)
Scheduled appointment per 11/20 referral. Patient is aware of appointment date and time. Patient is aware to arrive 15 mins prior to appointment time and to bring updated insurance cards. Patient is aware of location.   

## 2022-08-26 ENCOUNTER — Ambulatory Visit (INDEPENDENT_AMBULATORY_CARE_PROVIDER_SITE_OTHER): Payer: Medicare Other | Admitting: Orthopaedic Surgery

## 2022-08-26 ENCOUNTER — Encounter: Payer: Self-pay | Admitting: Orthopaedic Surgery

## 2022-08-26 DIAGNOSIS — M75112 Incomplete rotator cuff tear or rupture of left shoulder, not specified as traumatic: Secondary | ICD-10-CM

## 2022-08-26 DIAGNOSIS — M170 Bilateral primary osteoarthritis of knee: Secondary | ICD-10-CM

## 2022-08-26 DIAGNOSIS — M5416 Radiculopathy, lumbar region: Secondary | ICD-10-CM

## 2022-08-26 NOTE — Progress Notes (Signed)
Office Visit Note   Patient: Larry [name]           Date of Birth: [birth year]           MRN: [id number] Visit Date: 08/26/2022              Requested by: Merrilee Seashore, [address] [place],  [place] [address] PCP: Merrilee Seashore, MD   Assessment & Plan: Visit Diagnoses:  1. Primary osteoarthritis of both knees   2. Nontraumatic incomplete tear of left rotator cuff   3. Lumbar radiculopathy, chronic     Plan: Larry [name] comes in today for couple reasons.  First he is status post rotator cuff repair about a year ago.  He has begun doing some weight training with his upper body.  Recently he did some overhead activities and felt a pulling in the left shoulder.  He admits this has gotten better and mostly resolved but wants to make sure he is not doing any damage to his repair.  Secondly he comes in today to renew his handicap parking placard he has a history of shoulder surgery of the replacement of lower back arthritis and chronic lower back pain.  Makes it difficult for him to ambulate long distances.  With regards to his shoulder we discussed with him that he should avoid quick acceleration and deceleration activities.  He should be mindful of pain.  Has good strength and a has not done any damage to his repair.  Given his multiple comorbidities that are mostly orthopedic in nature we will go forward and give him a renewal of his handicap placard  Follow-Up Instructions: Return if symptoms worsen or fail to improve.   Orders:  No orders of the defined types were placed in this encounter.  No orders of the defined types were placed in this encounter.     Procedures: No procedures performed   Clinical Data: No additional findings.   Subjective: Chief Complaint  Patient presents with   Left Shoulder - Pain    HPI Larry [name] comes in today to discuss his left shoulder and requests a renewal of his temporary handicap parking placard if  appropriate  Review of Systems  All other systems reviewed and are negative.    Objective: Vital Signs: There were no vitals taken for this visit.  Physical Exam Constitutional:      Appearance: Normal appearance.  Neurological:     Mental Status: He is alert.     Ortho Exam Left shoulder well-healed surgical incision no swelling no erythema.  He has good forward elevation internal rotation behind his back.  Strength is intact sensation he is intact neurovascular intact distally.  No neck pain. Specialty Comments:  No specialty comments available.  Imaging: No results found.   PMFS History: Patient Active Problem List   Diagnosis Date Noted   Left carpal tunnel syndrome 07/07/2022   Acute thigh pain, right 05/01/2021   CMC (carpometacarpal joint) dislocation, left, initial encounter 11/12/2020   Post-operative state 08/20/2020   Nontraumatic incomplete tear of left rotator cuff 08/20/2020   AC (acromioclavicular) arthritis 08/20/2020   Complete tear of left rotator cuff 07/09/2020   Osteoarthritis of first carpometacarpal [facility]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 05/07/2020   Osteoarthritis of left AC (acromioclavicular) joint 05/07/2020   Impingement syndrome of left shoulder 02/08/2020   S/P TKR (total knee replacement) using cement, left 10/03/2019   Cellulitis of arm 08/28/2019   Essential hypertension 02/16/2018   Sepsis  due to skin infection ([place]) 02/16/2018   Chronic pain 02/16/2018   Cellulitis of left lower extremity    Lumbar radiculopathy, chronic 01/24/2018   Lumbar stenosis with neurogenic claudication 03/23/2017   Hypothyroidism 07/25/2015   Primary osteoarthritis of right knee 07/23/2015   Primary osteoarthritis of knee 07/23/2015   Past Medical History:  Diagnosis Date   Arthritis    "knees; left shoulder" (04/12/2013)   Asthma    "as a child" (04/12/2013)   DDD (degenerative disc disease), cervical    DDD (degenerative disc disease), lumbar    Difficult  intubation    2012   CERVICAL FUSION    Headache(784.0)    VERAPAMIL FOR PREVENTION    Hemochromatosis    "defective gene" (04/12/2013); 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 ago, just gets jittery   Hypothyroidism    Lumbar stenosis    MRSA (methicillin resistant staph aureus) culture positive 09/10/2019   upper arm   OSA (obstructive sleep apnea)    "haven't wore mask in years; had OR; still snore" (04/12/2013)   Urinary frequency     History reviewed. No pertinent family history.  Past Surgical History:  Procedure Laterality Date   BACK SURGERY     CARPOMETACARPEL SUSPENSION PLASTY Left 07/19/2020   Procedure: LEFT THUMB CARPOMETACARPAL ([place]) ARTHROPLASTY;  Surgeon: Leandrew Koyanagi, MD;  Location: [place];  Service: Orthopedics;  Laterality: Left;   CERVICAL FUSION  2002; 2012   CYSTECTOMY     INGUINAL HERNIA REPAIR Bilateral 1997   KNEE ARTHROSCOPY Left ~ 1997; ~ 2005   LUMBAR FUSION  2007; 04/11/2013   MICROLARYNGOSCOPY N/A 03/07/2021   Procedure: DIRECT MICROLARYNGOSCOPY WITH BIOPSY;  Surgeon: Rozetta Nunnery, MD;  Location: [place];  Service: ENT;  Laterality: N/A;   PENILE DEBRIDEMENT  ~ 2011   X 3  FOR MRSA INFECTION     SHOULDER ARTHROSCOPY Left ~ 2003   TONSILLECTOMY  1990's   TOTAL KNEE ARTHROPLASTY Right 07/23/2015   TOTAL KNEE ARTHROPLASTY Right 07/23/2015   Procedure: TOTAL KNEE ARTHROPLASTY;  Surgeon: Garald Balding, MD;  Location: [place];  Service: Orthopedics;  Laterality: Right;   TOTAL KNEE ARTHROPLASTY Left 10/03/2019   Procedure: LEFT TOTAL KNEE ARTHROPLASTY;  Surgeon: Garald Balding, MD;  Location: [facility];  Service: Orthopedics;  Laterality: Left;   UVULOPALATOPHARYNGOPLASTY (UPPP)/TONSILLECTOMY/SEPTOPLASTY  1990's   VASECTOMY     Social History   Occupational History   Not on  file  Tobacco Use   Smoking status: Former    Packs/day: 0.50    Years: 15.00    Total pack years: 7.50    Types: Cigarettes    Quit date: 10/14/1986    Years since quitting: 35.8   Smokeless tobacco: Never  Vaping Use   Vaping Use: Never used  Substance and Sexual Activity   Alcohol use: Yes    Comment: occasional   Drug use: No   Sexual activity: Yes

## 2022-09-01 ENCOUNTER — Other Ambulatory Visit: Payer: Self-pay

## 2022-09-01 ENCOUNTER — Inpatient Hospital Stay: Payer: Medicare Other

## 2022-09-01 ENCOUNTER — Inpatient Hospital Stay: Payer: Medicare Other | Attending: Hematology and Oncology | Admitting: Hematology and Oncology

## 2022-09-01 VITALS — BP 150/71 | HR 97 | Temp 97.5°F | Resp 18 | Ht 66.0 in | Wt 140.1 lb

## 2022-09-01 DIAGNOSIS — Z79899 Other long term (current) drug therapy: Secondary | ICD-10-CM | POA: Insufficient documentation

## 2022-09-01 DIAGNOSIS — D751 Secondary polycythemia: Secondary | ICD-10-CM | POA: Insufficient documentation

## 2022-09-01 DIAGNOSIS — R5383 Other fatigue: Secondary | ICD-10-CM | POA: Insufficient documentation

## 2022-09-01 DIAGNOSIS — E039 Hypothyroidism, unspecified: Secondary | ICD-10-CM | POA: Insufficient documentation

## 2022-09-01 DIAGNOSIS — Z79890 Hormone replacement therapy (postmenopausal): Secondary | ICD-10-CM | POA: Insufficient documentation

## 2022-09-01 LAB — CBC WITH DIFFERENTIAL (CANCER CENTER ONLY)
Abs Immature Granulocytes: 0.12 10*3/uL — ABNORMAL HIGH (ref 0.00–0.07)
Basophils Absolute: 0.1 10*3/uL (ref 0.0–0.1)
Basophils Relative: 1 %
Eosinophils Absolute: 0.1 10*3/uL (ref 0.0–0.5)
Eosinophils Relative: 1 %
HCT: 54.9 % — ABNORMAL HIGH (ref 39.0–52.0)
Hemoglobin: 18.7 g/dL — ABNORMAL HIGH (ref 13.0–17.0)
Immature Granulocytes: 1 %
Lymphocytes Relative: 19 %
Lymphs Abs: 1.7 10*3/uL (ref 0.7–4.0)
MCH: 33.6 pg (ref 26.0–34.0)
MCHC: 34.1 g/dL (ref 30.0–36.0)
MCV: 98.6 fL (ref 80.0–100.0)
Monocytes Absolute: 0.7 10*3/uL (ref 0.1–1.0)
Monocytes Relative: 8 %
Neutro Abs: 6.2 10*3/uL (ref 1.7–7.7)
Neutrophils Relative %: 70 %
Platelet Count: 157 10*3/uL (ref 150–400)
RBC: 5.57 MIL/uL (ref 4.22–5.81)
RDW: 14 % (ref 11.5–15.5)
WBC Count: 8.9 10*3/uL (ref 4.0–10.5)
nRBC: 0 % (ref 0.0–0.2)

## 2022-09-01 LAB — CMP (CANCER CENTER ONLY)
ALT: 37 U/L (ref 0–44)
AST: 32 U/L (ref 15–41)
Albumin: 4.5 g/dL (ref 3.5–5.0)
Alkaline Phosphatase: 47 U/L (ref 38–126)
Anion gap: 5 (ref 5–15)
BUN: 29 mg/dL — ABNORMAL HIGH (ref 8–23)
CO2: 32 mmol/L (ref 22–32)
Calcium: 10 mg/dL (ref 8.9–10.3)
Chloride: 103 mmol/L (ref 98–111)
Creatinine: 1.45 mg/dL — ABNORMAL HIGH (ref 0.61–1.24)
GFR, Estimated: 52 mL/min — ABNORMAL LOW (ref >60)
Glucose, Bld: 96 mg/dL (ref 70–99)
Potassium: 4.9 mmol/L (ref 3.5–5.1)
Sodium: 140 mmol/L (ref 135–145)
Total Bilirubin: 1.2 mg/dL (ref 0.3–1.2)
Total Protein: 6.9 g/dL (ref 6.5–8.1)

## 2022-09-01 LAB — IRON AND IRON BINDING CAPACITY (CC-WL,HP ONLY)
Iron: 250 ug/dL — ABNORMAL HIGH (ref 45–182)
Saturation Ratios: 65 % — ABNORMAL HIGH (ref 17.9–39.5)
TIBC: 388 ug/dL (ref 250–450)
UIBC: 138 ug/dL (ref 117–376)

## 2022-09-01 NOTE — Progress Notes (Signed)
[facility] CONSULT NOTE  Patient Care Team: Merrilee Seashore, MD as PCP - General (Internal Medicine)  CHIEF COMPLAINTS/PURPOSE OF CONSULTATION:  Consultation for polycythemia and hemochromatosis  HISTORY OF PRESENTING ILLNESS:  Larry [name] 71 y.o. male is here because of recent diagnosis of polycythemia.  He has a longstanding history of hemochromatosis but apparently the hemochromatosis is not been affecting his liver function.  He tells me that he may have only 1 abnormal gene.  He has been on testosterone replacement therapy for over 20 years.  Over the past 2 years there was noticeably increased number of red blood cells.  He does not have any signs or symptoms of having too much blood.  He went to [facility] but they would not accept his blood because of his history of hemochromatosis.  He was referred to [place] for treatment of both of these conditions and for consideration for phlebotomy.  I reviewed her records extensively and collaborated the history with the patient.   MEDICAL HISTORY:  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 ago, just gets jittery   Hypothyroidism    Lumbar stenosis    MRSA (methicillin resistant staph aureus) culture positive 09/10/2019   upper arm   OSA (obstructive sleep apnea)    "haven't wore mask in years; had OR; still snore" (04/12/2013)   Urinary frequency     SURGICAL HISTORY: Past Surgical History:  Procedure Laterality Date   BACK  SURGERY     CARPOMETACARPEL SUSPENSION PLASTY Left 07/19/2020   Procedure: LEFT THUMB CARPOMETACARPAL ([place]) ARTHROPLASTY;  Surgeon: Leandrew Koyanagi, MD;  Location: [place];  Service: Orthopedics;  Laterality: Left;   CERVICAL FUSION  2002; 2012   CYSTECTOMY     INGUINAL HERNIA REPAIR Bilateral 1997   KNEE ARTHROSCOPY Left ~ 1997; ~ 2005   LUMBAR FUSION  2007; 04/11/2013   MICROLARYNGOSCOPY N/A 03/07/2021   Procedure: DIRECT MICROLARYNGOSCOPY WITH BIOPSY;  Surgeon: Rozetta Nunnery, MD;  Location: [place];  Service: ENT;  Laterality: N/A;   PENILE DEBRIDEMENT  ~ 2011   X 3  FOR MRSA INFECTION     SHOULDER ARTHROSCOPY Left ~ 2003   TONSILLECTOMY  1990's   TOTAL KNEE ARTHROPLASTY Right 07/23/2015   TOTAL KNEE ARTHROPLASTY Right 07/23/2015   Procedure: TOTAL KNEE ARTHROPLASTY;  Surgeon: Garald Balding, MD;  Location: [place];  Service: Orthopedics;  Laterality: Right;   TOTAL KNEE ARTHROPLASTY Left 10/03/2019   Procedure: LEFT TOTAL KNEE ARTHROPLASTY;  Surgeon: Garald Balding, MD;  [facility];  Service: Orthopedics;  Laterality: Left;   UVULOPALATOPHARYNGOPLASTY (UPPP)/TONSILLECTOMY/SEPTOPLASTY  1990's   VASECTOMY      SOCIAL HISTORY: Social History   Socioeconomic History   Marital status: Married    Spouse name: Not on file   Number of children: Not on file  Years of education: Not on file   Highest education level: Not on file  Occupational History   Not on file  Tobacco Use   Smoking status: Former    Packs/day: 0.50    Years: 15.00    Total pack years: 7.50    Types: Cigarettes    Quit date: 10/14/1986    Years since quitting: 35.9   Smokeless tobacco: Never  Vaping Use   Vaping Use: Never used  Substance and Sexual Activity   Alcohol use: Yes    Comment: occasional   Drug use: No   Sexual activity: Yes  Other Topics Concern   Not on file  Social History Narrative   Not on file   Social Determinants of Health    Financial Resource Strain: Not on file  Food Insecurity: Not on file  Transportation Needs: Not on file  Physical Activity: Not on file  Stress: Not on file  Social Connections: Not on file  Intimate Partner Violence: Not on file    FAMILY HISTORY: No family history of cancers  ALLERGIES:  has No Known Allergies.  MEDICATIONS:  Current Outpatient Medications  Medication Sig Dispense Refill   amoxicillin (AMOXIL) 500 MG capsule TAKE ALL 4 TABLETS 1 HOUR PRIOR TO DENTAL APPOINTMENT 12 capsule 0   amoxicillin (AMOXIL) 500 MG tablet Take 4 tablets by mouth 1 hour prior to dental procedure. 12 tablet TABLET   furosemide (LASIX) 40 MG tablet Take 40 mg by mouth daily as needed for fluid or edema.      HYDROcodone-acetaminophen (NORCO/VICODIN) 5-325 MG tablet Take 1 tablet by mouth 3 (three) times daily as needed for moderate pain. To be taken after surgery 15 tablet 0   ketorolac (TORADOL) 10 MG tablet Take 1 tablet (10 mg total) by mouth 2 (two) times daily as needed. 10 tablet 0   levothyroxine (SYNTHROID) 200 MCG tablet Take 200 mcg by mouth daily before breakfast.      LORazepam (ATIVAN) 0.5 MG tablet Take 0.5-1 mg by mouth at bedtime.   1   losartan (COZAAR) 50 MG tablet Take 50 mg by mouth daily.      meloxicam (MOBIC) 15 MG tablet Take 15 mg by mouth daily.      Multiple Vitamin (MULTIVITAMIN WITH MINERALS) TABS tablet Take 1 tablet by mouth daily.     omeprazole (PRILOSEC) 40 MG capsule TAKE 1 CAPSULE EVERY DAY BEFORE DINNER 30 capsule 1   omeprazole (PRILOSEC) 40 MG capsule Take 1 capsule (40 mg total) by mouth daily. 30 capsule 6   ondansetron (ZOFRAN) 4 MG tablet Take 1 tablet (4 mg total) by mouth every 8 (eight) hours as needed for nausea or vomiting. 40 tablet 0   phenylephrine (SUDAFED PE) 10 MG TABS tablet Take 10 mg by mouth every 4 (four) hours as needed.     predniSONE (STERAPRED UNI-PAK 21 TAB) 10 MG (21) TBPK tablet Take as directed 21 tablet 3   Sildenafil Citrate  (VIAGRA PO) Take 30-60 mg by mouth daily as needed (for erectile dysfunction). Purchased from [employer]--dose indicated on website as 30 mg     tamsulosin (FLOMAX) 0.4 MG CAPS capsule Take 0.4 mg by mouth daily.     testosterone enanthate (DELATESTRYL) 200 MG/ML injection Inject 200 mg into the muscle every 14 (fourteen) days. Fridays.     verapamil (CALAN-SR) 180 MG CR tablet Take 180 mg by mouth daily.  15   vitamin B-12 (CYANOCOBALAMIN) 100 MCG tablet Take 100 mcg by mouth  daily.     No current facility-administered medications for this visit.    REVIEW OF SYSTEMS:   Constitutional: Denies fevers, chills or abnormal night sweats   All other systems were reviewed with the patient and are negative.  PHYSICAL EXAMINATION: ECOG PERFORMANCE STATUS: 0 - Asymptomatic  Vitals:   09/01/22 1539  BP: (!) 150/71  Pulse: 97  Resp: 18  Temp: (!) 97.5 F (36.4 C)  SpO2: 97%   Filed Weights   09/01/22 1539  Weight: 140 lb 1.6 oz (63.5 kg)    GENERAL:alert, no distress and comfortable    LABORATORY DATA:  I have reviewed the data as listed Lab Results  Component Value Date   WBC 6.0 09/26/2019   HGB 15.9 09/26/2019   HCT 46.2 09/26/2019   MCV 97.1 09/26/2019   PLT 159 09/26/2019   Lab Results  Component Value Date   NA 139 03/07/2021   K 4.2 03/07/2021   CL 104 03/07/2021   CO2 26 03/07/2021    RADIOGRAPHIC STUDIES: I have personally reviewed the radiological reports and agreed with the findings in the report.  ASSESSMENT AND PLAN:  Polycythemia, secondary Lab review 07/30/2022: Testosterone 492, hemoglobin 18.5,, hematocrit 54.3, platelets 169, creatinine 0.94, LFTs normal  I discussed with the patient extensively the differential diagnosis of polycythemia 1. Primary polycythemia due to clonal stem cell abnormality 2. Secondary polycythemia due to cause that include hypoxia, heart or lung problems, altitude, athletics, or testosterone replacement therapy  etc  Recommendation: 1. JAK-2 mutation testing to evaluate polycythemia vera 2. Erythropoietin level    Indications for phlebotomy 1. Primary polycythemia with hematocrit over 50 2. Secondary polycythemia with severe symptoms which include strokelike symptoms, severe recurrent headaches, severe fatigue.  Because the patient has symptoms of fatigue and malaise I recommended that we do a phlebotomy at this time.     Return to clinic in 2 weeks with labs and follow-up   Hereditary hemochromatosis ([place]) CMP performed recently did not show any increase in LFTs. We will check iron studies and ferritin as well as a hemochromatosis gene testing. 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 The patient knows to call the clinic with any problems, questions or concerns.    Harriette Ohara, MD 09/01/22

## 2022-09-01 NOTE — Assessment & Plan Note (Signed)
Lab review 07/30/2022: Testosterone 492, hemoglobin 18.5,, hematocrit 54.3, platelets 169, creatinine 0.94, LFTs normal  I discussed with the patient extensively the differential diagnosis of polycythemia 1. Primary polycythemia due to clonal stem cell abnormality 2. Secondary polycythemia due to cause that include hypoxia, heart or lung problems, altitude, athletics, or testosterone replacement therapy etc  Recommendation: 1. JAK-2 mutation testing to evaluate polycythemia vera 2. Erythropoietin level    Indications for phlebotomy 1. Primary polycythemia with hematocrit over 50 2. Secondary polycythemia with severe symptoms which include strokelike symptoms, severe recurrent headaches, severe fatigue.  Because the patient has symptoms of fatigue and malaise I recommended that we do a phlebotomy at this time.     Return to clinic in 2 weeks with labs and follow-up

## 2022-09-01 NOTE — Assessment & Plan Note (Signed)
CMP performed recently did not show any increase in LFTs. We will check iron studies and ferritin as well as a hemochromatosis gene testing.

## 2022-09-02 ENCOUNTER — Telehealth: Payer: Self-pay

## 2022-09-02 ENCOUNTER — Telehealth: Payer: Self-pay | Admitting: Hematology and Oncology

## 2022-09-02 ENCOUNTER — Ambulatory Visit: Payer: Medicare Other | Admitting: Hematology and Oncology

## 2022-09-02 LAB — FERRITIN: Ferritin: 132 ng/mL (ref 24–336)

## 2022-09-02 NOTE — Telephone Encounter (Signed)
Pt called to confirm appt for phlebotomy 12/12. He has an understanding of this appt and knows to call with any question.

## 2022-09-02 NOTE — Telephone Encounter (Signed)
Scheduled appointment per 1/25 los. Patient is aware. 

## 2022-09-03 DIAGNOSIS — E039 Hypothyroidism, unspecified: Secondary | ICD-10-CM | POA: Diagnosis not present

## 2022-09-07 LAB — HEMOCHROMATOSIS DNA-PCR(C282Y,H63D)

## 2022-09-08 ENCOUNTER — Inpatient Hospital Stay: Payer: Medicare Other

## 2022-09-08 ENCOUNTER — Other Ambulatory Visit: Payer: Self-pay | Admitting: Hematology and Oncology

## 2022-09-08 ENCOUNTER — Other Ambulatory Visit: Payer: Self-pay

## 2022-09-08 DIAGNOSIS — D751 Secondary polycythemia: Secondary | ICD-10-CM | POA: Diagnosis not present

## 2022-09-08 LAB — JAK2 (INCLUDING V617F AND EXON 12), MPL,& CALR W/RFL MPN PANEL (NGS)

## 2022-09-08 NOTE — Progress Notes (Signed)
Mickle Asper Stigler presents today for phlebotomy per MD orders. Phlebotomy procedure started at 1629 and ended at 1640. 486 grams removed. Patient observed for 30 minutes after procedure without any incident. Patient tolerated procedure well. IV needle removed intact. Food and beverage provided.

## 2022-09-08 NOTE — Patient Instructions (Signed)

## 2022-09-08 NOTE — Progress Notes (Signed)
Phlebotomy orders entered for Dr Lindi Adie.  Larry [name]

## 2022-09-09 DIAGNOSIS — E291 Testicular hypofunction: Secondary | ICD-10-CM | POA: Diagnosis not present

## 2022-09-09 DIAGNOSIS — E039 Hypothyroidism, unspecified: Secondary | ICD-10-CM | POA: Diagnosis not present

## 2022-09-11 NOTE — Progress Notes (Signed)
Patient Care Team: Georgianne Fick, MD as PCP - General (Internal Medicine)  DIAGNOSIS:  Encounter Diagnosis  Name Primary?   Polycythemia, secondary Yes    CHIEF COMPLIANT: Follow-up of hemochromatosis and polycythemia  INTERVAL HISTORY: Larry [name] is a 71 year old above-mentioned history of hemochromatosis and polycythemia who underwent phlebotomy on 09/08/2022.  After the phlebotomy he felt slightly fatigued.  He takes testosterone for testosterone deficiency and that is a possible cause of the polycythemia.  JAK2 mutation was negative.   ALLERGIES:  has No Known Allergies.  MEDICATIONS:  Current Outpatient Medications  Medication Sig Dispense Refill   amoxicillin (AMOXIL) 500 MG capsule TAKE ALL 4 TABLETS 1 HOUR PRIOR TO DENTAL APPOINTMENT 12 capsule 0   amoxicillin (AMOXIL) 500 MG tablet Take 4 tablets by mouth 1 hour prior to dental procedure. 12 tablet TABLET   furosemide (LASIX) 40 MG tablet Take 40 mg by mouth daily as needed for fluid or edema.      HYDROcodone-acetaminophen (NORCO/VICODIN) 5-325 MG tablet Take 1 tablet by mouth 3 (three) times daily as needed for moderate pain. To be taken after surgery 15 tablet 0   ketorolac (TORADOL) 10 MG tablet Take 1 tablet (10 mg total) by mouth 2 (two) times daily as needed. 10 tablet 0   levothyroxine (SYNTHROID) 200 MCG tablet Take 200 mcg by mouth daily before breakfast.      LORazepam (ATIVAN) 0.5 MG tablet Take 0.5-1 mg by mouth at bedtime.   1   losartan (COZAAR) 50 MG tablet Take 50 mg by mouth daily.      meloxicam (MOBIC) 15 MG tablet Take 15 mg by mouth daily.      Multiple Vitamin (MULTIVITAMIN WITH MINERALS) TABS tablet Take 1 tablet by mouth daily.     omeprazole (PRILOSEC) 40 MG capsule TAKE 1 CAPSULE EVERY DAY BEFORE DINNER 30 capsule 1   omeprazole (PRILOSEC) 40 MG capsule Take 1 capsule (40 mg total) by mouth daily. 30 capsule 6   ondansetron (ZOFRAN) 4 MG tablet Take 1 tablet (4 mg total) by mouth  every 8 (eight) hours as needed for nausea or vomiting. 40 tablet 0   phenylephrine (SUDAFED PE) 10 MG TABS tablet Take 10 mg by mouth every 4 (four) hours as needed.     predniSONE (STERAPRED UNI-PAK 21 TAB) 10 MG (21) TBPK tablet Take as directed 21 tablet 3   Sildenafil Citrate (VIAGRA PO) Take 30-60 mg by mouth daily as needed (for erectile dysfunction). Purchased from [employer]--dose indicated on website as 30 mg     tamsulosin (FLOMAX) 0.4 MG CAPS capsule Take 0.4 mg by mouth daily.     testosterone enanthate (DELATESTRYL) 200 MG/ML injection Inject 200 mg into the muscle every 14 (fourteen) days. Fridays.     verapamil (CALAN-SR) 180 MG CR tablet Take 180 mg by mouth daily.  15   vitamin B-12 (CYANOCOBALAMIN) 100 MCG tablet Take 100 mcg by mouth daily.     No current facility-administered medications for this visit.    PHYSICAL EXAMINATION: ECOG PERFORMANCE STATUS: 1 - Symptomatic but completely ambulatory  Vitals:   09/15/22 0759  BP: (!) 163/87  Pulse: 70  Resp: 18  Temp: (!) 97.2 F (36.2 C)  SpO2: 99%   Filed Weights   09/15/22 0759  Weight: 190 lb 1.6 oz (86.2 kg)     LABORATORY DATA:  I have reviewed the data as listed    Latest Ref Rng & Units 09/01/2022    4:12 PM 03/07/2021  7:47 AM 03/03/2021    8:00 AM  CMP  Glucose 70 - 99 mg/dL 96  99  528   BUN 8 - 23 mg/dL 29  18  21    Creatinine 0.61 - 1.24 mg/dL 4.13  2.44  0.10   Sodium 135 - 145 mmol/L 140  139  137   Potassium 3.5 - 5.1 mmol/L 4.9  4.2  5.7   Chloride 98 - 111 mmol/L 103  104  99   CO2 22 - 32 mmol/L 32  26  28   Calcium 8.9 - 10.3 mg/dL 27.2  8.9  8.8   Total Protein 6.5 - 8.1 g/dL 6.9     Total Bilirubin 0.3 - 1.2 mg/dL 1.2     Alkaline Phos 38 - 126 U/L 47     AST 15 - 41 U/L 32     ALT 0 - 44 U/L 37       Lab Results  Component Value Date   WBC 6.0 09/15/2022   HGB 17.0 09/15/2022   HCT 48.0 09/15/2022   MCV 95.0 09/15/2022   PLT 165 09/15/2022   NEUTROABS 3.8 09/15/2022     ASSESSMENT & PLAN:  Polycythemia, secondary Lab review 07/30/2022: Testosterone 492, hemoglobin 18.5,, hematocrit 54.3, platelets 169, creatinine 0.94, LFTs normal 09/01/2022: Hemoglobin 18.7, hematocrit 54.9, platelets 157, ferritin 132, creatinine 1.45, iron saturation 65% 09/01/2022: MPN panel: Normal (no JAK2 mutation) 09/01/2022: Hemochromatosis gene testing:c.187C>G (p.His63Asp) - Detected, heterozygous   Therefore patient has secondary polycythemia Also has heterozygous hemochromatosis  Treatment plan: Phlebotomy with a goal of ferritin of 50  Phlebotomy: 09/08/2022  His hobby is setting up toy trains and building working models of the trains.   Recheck labs in 2 months and if the ferritin is greater than 50, schedule him for a phlebotomy.    No orders of the defined types were placed in this encounter.  The patient has a good understanding of the overall plan. he agrees with it. he will call with any problems that may develop before the next visit here. Total time spent: 30 mins including face to face time and time spent for planning, charting and co-ordination of care   Tamsen Meek, MD 09/15/22    I Janan Ridge am acting as a Neurosurgeon for [employer]  I have reviewed the above documentation for accuracy and completeness, and I agree with the above.

## 2022-09-14 ENCOUNTER — Other Ambulatory Visit: Payer: Self-pay | Admitting: *Deleted

## 2022-09-15 ENCOUNTER — Ambulatory Visit: Payer: Medicare Other | Admitting: Hematology and Oncology

## 2022-09-15 ENCOUNTER — Other Ambulatory Visit: Payer: Self-pay | Admitting: *Deleted

## 2022-09-15 ENCOUNTER — Ambulatory Visit (INDEPENDENT_AMBULATORY_CARE_PROVIDER_SITE_OTHER): Payer: Medicare Other | Admitting: Physician Assistant

## 2022-09-15 ENCOUNTER — Other Ambulatory Visit: Payer: Medicare Other

## 2022-09-15 ENCOUNTER — Inpatient Hospital Stay: Payer: Medicare Other | Admitting: Hematology and Oncology

## 2022-09-15 ENCOUNTER — Inpatient Hospital Stay: Payer: Medicare Other

## 2022-09-15 ENCOUNTER — Encounter: Payer: Self-pay | Admitting: Hematology and Oncology

## 2022-09-15 ENCOUNTER — Other Ambulatory Visit: Payer: Self-pay

## 2022-09-15 VITALS — BP 163/87 | HR 70 | Temp 97.2°F | Resp 18 | Ht 66.0 in | Wt 190.1 lb

## 2022-09-15 DIAGNOSIS — H6691 Otitis media, unspecified, right ear: Secondary | ICD-10-CM | POA: Diagnosis not present

## 2022-09-15 DIAGNOSIS — E039 Hypothyroidism, unspecified: Secondary | ICD-10-CM | POA: Diagnosis not present

## 2022-09-15 DIAGNOSIS — G5602 Carpal tunnel syndrome, left upper limb: Secondary | ICD-10-CM

## 2022-09-15 DIAGNOSIS — D751 Secondary polycythemia: Secondary | ICD-10-CM | POA: Diagnosis not present

## 2022-09-15 DIAGNOSIS — Z9889 Other specified postprocedural states: Secondary | ICD-10-CM

## 2022-09-15 DIAGNOSIS — Z79899 Other long term (current) drug therapy: Secondary | ICD-10-CM | POA: Diagnosis not present

## 2022-09-15 DIAGNOSIS — R5383 Other fatigue: Secondary | ICD-10-CM | POA: Diagnosis not present

## 2022-09-15 DIAGNOSIS — Z23 Encounter for immunization: Secondary | ICD-10-CM | POA: Diagnosis not present

## 2022-09-15 DIAGNOSIS — Z79890 Hormone replacement therapy (postmenopausal): Secondary | ICD-10-CM | POA: Diagnosis not present

## 2022-09-15 LAB — CBC WITH DIFFERENTIAL (CANCER CENTER ONLY)
Abs Immature Granulocytes: 0.07 10*3/uL (ref 0.00–0.07)
Basophils Absolute: 0 10*3/uL (ref 0.0–0.1)
Basophils Relative: 1 %
Eosinophils Absolute: 0.1 10*3/uL (ref 0.0–0.5)
Eosinophils Relative: 2 %
HCT: 48 % (ref 39.0–52.0)
Hemoglobin: 17 g/dL (ref 13.0–17.0)
Immature Granulocytes: 1 %
Lymphocytes Relative: 26 %
Lymphs Abs: 1.6 10*3/uL (ref 0.7–4.0)
MCH: 33.7 pg (ref 26.0–34.0)
MCHC: 35.4 g/dL (ref 30.0–36.0)
MCV: 95 fL (ref 80.0–100.0)
Monocytes Absolute: 0.5 10*3/uL (ref 0.1–1.0)
Monocytes Relative: 8 %
Neutro Abs: 3.8 10*3/uL (ref 1.7–7.7)
Neutrophils Relative %: 62 %
Platelet Count: 165 10*3/uL (ref 150–400)
RBC: 5.05 MIL/uL (ref 4.22–5.81)
RDW: 13.6 % (ref 11.5–15.5)
WBC Count: 6 10*3/uL (ref 4.0–10.5)
nRBC: 0 % (ref 0.0–0.2)

## 2022-09-15 NOTE — Progress Notes (Signed)
Post-Op Visit Note   Patient: Larry [name]           Date of Birth: 1951-05-05           MRN: [id number] Visit Date: 09/15/2022 PCP: Merrilee Seashore, MD   Assessment & Plan:  Chief Complaint:  Chief Complaint  Patient presents with   Left Wrist - Follow-up   Visit Diagnoses:  1. History of carpal tunnel release   2. Left carpal tunnel syndrome     Plan: Patient is a pleasant 71 year old gentleman who comes in today 6 weeks status post left carpal tunnel release 07/30/2022.  He is doing well.  No pain or paresthesias.  Examination of his left wrist reveals a fully healed surgical scar without complication.  He is neurovascular intact distally.  At this point he is fully healed.  He may increase activity as tolerated.  Follow-up as needed.  Follow-Up Instructions: Return if symptoms worsen or fail to improve.   Orders:  No orders of the defined types were placed in this encounter.  No orders of the defined types were placed in this encounter.   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 05/07/2020   Osteoarthritis of left AC (acromioclavicular) joint 05/07/2020   Impingement syndrome of left shoulder 02/08/2020   S/P TKR (total knee replacement) using cement, left 10/03/2019   Cellulitis of arm 08/28/2019   Essential hypertension 02/16/2018   Sepsis due to skin infection ([place]) 02/16/2018   Chronic pain 02/16/2018    Cellulitis of left lower extremity    Lumbar radiculopathy, chronic 01/24/2018   Lumbar stenosis with neurogenic claudication 03/23/2017   Hypothyroidism 07/25/2015   Primary osteoarthritis of right knee 07/23/2015   Primary osteoarthritis of knee 07/23/2015   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 ago, just gets jittery   Hypothyroidism    Lumbar stenosis    MRSA (methicillin resistant staph aureus) culture positive 09/10/2019   upper arm   OSA (obstructive sleep apnea)    "haven't wore mask in years; had OR; still snore" (04/12/2013)   Urinary frequency     No family history on file.  Past Surgical History:  Procedure Laterality Date   BACK SURGERY     CARPOMETACARPEL SUSPENSION PLASTY Left 07/19/2020   Procedure: LEFT THUMB CARPOMETACARPAL ([place]) ARTHROPLASTY;  Surgeon: Leandrew Koyanagi, MD;  Location: [facility];  Service: Orthopedics;  Laterality: Left;   CERVICAL FUSION  2002; 2012   CYSTECTOMY     INGUINAL HERNIA REPAIR Bilateral 1997   KNEE ARTHROSCOPY Left ~ 1997; ~ 2005   LUMBAR FUSION  2007; 04/11/2013   MICROLARYNGOSCOPY N/A 03/07/2021   Procedure: DIRECT MICROLARYNGOSCOPY WITH BIOPSY;  Surgeon: Rozetta Nunnery, MD;  Location: [place];  Service: ENT;  Laterality: N/A;   PENILE DEBRIDEMENT  ~ 2011   X 3  FOR MRSA INFECTION     SHOULDER ARTHROSCOPY Left ~ 2003   TONSILLECTOMY  1990's   TOTAL KNEE ARTHROPLASTY Right 07/23/2015   TOTAL KNEE  ARTHROPLASTY Right 07/23/2015   Procedure: TOTAL KNEE ARTHROPLASTY;  Surgeon: Garald Balding, MD;  Location: [place];  Service: Orthopedics;  Laterality: Right;   TOTAL KNEE ARTHROPLASTY Left 10/03/2019   Procedure: LEFT TOTAL KNEE ARTHROPLASTY;  Surgeon: Garald Balding, MD;  Location: [facility];  Service: Orthopedics;  Laterality: Left;   UVULOPALATOPHARYNGOPLASTY (UPPP)/TONSILLECTOMY/SEPTOPLASTY  1990's   VASECTOMY     Social History   Occupational History   Not on file  Tobacco Use   Smoking status: Former    Packs/day: 0.50    Years: 15.00    Total pack years: 7.50    Types: Cigarettes    Quit date: 10/14/1986    Years since quitting: 35.9   Smokeless tobacco: Never  Vaping Use   Vaping Use: Never used  Substance and Sexual Activity   Alcohol use: Yes    Comment: occasional   Drug use: No   Sexual activity: Yes

## 2022-09-15 NOTE — Progress Notes (Signed)
Pt requesting to be referred to nutrition to discuss eliminating iron rich foods from his diet due to iron overload. RN reviewed with MD and verbal orders received and placed.

## 2022-09-15 NOTE — Assessment & Plan Note (Addendum)
Lab review 07/30/2022: Testosterone 492, hemoglobin 18.5,, hematocrit 54.3, platelets 169, creatinine 0.94, LFTs normal 09/01/2022: Hemoglobin 18.7, hematocrit 54.9, platelets 157, ferritin 132, creatinine 1.45, iron saturation 65% 09/01/2022: MPN panel: Normal (no JAK2 mutation) 09/01/2022: Hemochromatosis gene testing:c.187C>G (p.His63Asp) - Detected, heterozygous   Therefore patient has secondary polycythemia Also has heterozygous hemochromatosis  Treatment plan: Phlebotomy with a goal of ferritin of 50  Phlebotomy: 09/08/2022  His hobby is setting up toy trains and building working models of the trains.   Recheck labs in 2 months and if the ferritin is greater than 50, schedule him for a phlebotomy.

## 2022-09-17 DIAGNOSIS — E291 Testicular hypofunction: Secondary | ICD-10-CM | POA: Diagnosis not present

## 2022-09-22 ENCOUNTER — Other Ambulatory Visit: Payer: Self-pay

## 2022-09-22 ENCOUNTER — Inpatient Hospital Stay: Payer: Medicare Other | Admitting: Nutrition

## 2022-09-22 NOTE — Progress Notes (Signed)
Patient referred to dietitian to educate on low iron foods.  Patient is a 72 year old male diagnosed with hemochromatosis/polycythemia status post phlebotomy on December 12.  Patient reports he is interested in learning how to reduce absorption of high iron foods.  He is also interested in information on weight loss.  States his wife would also appreciate any tips on losing weight. (She is not present.)  Nutrition diagnosis: Food and nutrition related knowledge deficit related to hemochromatosis as evidenced by no prior need for nutrition related information.  Intervention: Educated patient on foods containing a moderate and high amount of iron.  Provided nutrition facts sheets.  Educated on strategies for decreasing iron absorption. Explained basic strategies for healthy diet. Recommended patient request referral to outpatient RD specializing in weight loss if he wants additional information.  Monitoring, evaluation, goals: Patient will consume a healthy plant-based diet.  Nutrition diagnosis resolved.  No follow-up needed.  **Disclaimer: This note was dictated with voice recognition software. Similar sounding words can inadvertently be transcribed and this note may contain transcription errors which may not have been corrected upon publication of note.**

## 2022-10-05 ENCOUNTER — Encounter: Payer: Self-pay | Admitting: Hematology and Oncology

## 2022-10-05 ENCOUNTER — Other Ambulatory Visit: Payer: Self-pay | Admitting: *Deleted

## 2022-10-05 DIAGNOSIS — E039 Hypothyroidism, unspecified: Secondary | ICD-10-CM | POA: Diagnosis not present

## 2022-10-05 DIAGNOSIS — E291 Testicular hypofunction: Secondary | ICD-10-CM | POA: Diagnosis not present

## 2022-10-13 ENCOUNTER — Encounter: Payer: Self-pay | Admitting: Hematology and Oncology

## 2022-10-13 ENCOUNTER — Other Ambulatory Visit: Payer: Self-pay | Admitting: *Deleted

## 2022-10-13 DIAGNOSIS — E039 Hypothyroidism, unspecified: Secondary | ICD-10-CM

## 2022-10-18 ENCOUNTER — Encounter: Payer: Self-pay | Admitting: Hematology and Oncology

## 2022-10-22 DIAGNOSIS — E039 Hypothyroidism, unspecified: Secondary | ICD-10-CM | POA: Diagnosis not present

## 2022-10-22 DIAGNOSIS — M5459 Other low back pain: Secondary | ICD-10-CM | POA: Diagnosis not present

## 2022-10-22 DIAGNOSIS — M5417 Radiculopathy, lumbosacral region: Secondary | ICD-10-CM | POA: Diagnosis not present

## 2022-10-28 DIAGNOSIS — H9201 Otalgia, right ear: Secondary | ICD-10-CM | POA: Diagnosis not present

## 2022-10-28 DIAGNOSIS — R5381 Other malaise: Secondary | ICD-10-CM | POA: Diagnosis not present

## 2022-10-28 DIAGNOSIS — M542 Cervicalgia: Secondary | ICD-10-CM | POA: Diagnosis not present

## 2022-10-28 DIAGNOSIS — I10 Essential (primary) hypertension: Secondary | ICD-10-CM | POA: Diagnosis not present

## 2022-10-29 DIAGNOSIS — K080 Exfoliation of teeth due to systemic causes: Secondary | ICD-10-CM | POA: Diagnosis not present

## 2022-11-16 ENCOUNTER — Encounter: Payer: Self-pay | Admitting: Orthopaedic Surgery

## 2022-11-16 NOTE — Progress Notes (Signed)
Patient Care Team: Merrilee Seashore, MD as PCP - General (Internal Medicine)  DIAGNOSIS: No diagnosis found.  SUMMARY OF ONCOLOGIC HISTORY: Oncology History   No history exists.    CHIEF COMPLIANT:   INTERVAL HISTORY: Larry [name] is a   ALLERGIES:  has No Known Allergies.  MEDICATIONS:  Current Outpatient Medications  Medication Sig Dispense Refill   amoxicillin (AMOXIL) 500 MG capsule TAKE ALL 4 TABLETS 1 HOUR PRIOR TO DENTAL APPOINTMENT 12 capsule 0   amoxicillin (AMOXIL) 500 MG tablet Take 4 tablets by mouth 1 hour prior to dental procedure. 12 tablet TABLET   furosemide (LASIX) 40 MG tablet Take 40 mg by mouth daily as needed for fluid or edema.      HYDROcodone-acetaminophen (NORCO/VICODIN) 5-325 MG tablet Take 1 tablet by mouth 3 (three) times daily as needed for moderate pain. To be taken after surgery 15 tablet 0   ketorolac (TORADOL) 10 MG tablet Take 1 tablet (10 mg total) by mouth 2 (two) times daily as needed. 10 tablet 0   levothyroxine (SYNTHROID) 200 MCG tablet Take 200 mcg by mouth daily before breakfast.      LORazepam (ATIVAN) 0.5 MG tablet Take 0.5-1 mg by mouth at bedtime.   1   losartan (COZAAR) 50 MG tablet Take 50 mg by mouth daily.      meloxicam (MOBIC) 15 MG tablet Take 15 mg by mouth daily.      Multiple Vitamin (MULTIVITAMIN WITH MINERALS) TABS tablet Take 1 tablet by mouth daily.     omeprazole (PRILOSEC) 40 MG capsule TAKE 1 CAPSULE EVERY DAY BEFORE DINNER 30 capsule 1   omeprazole (PRILOSEC) 40 MG capsule Take 1 capsule (40 mg total) by mouth daily. 30 capsule 6   ondansetron (ZOFRAN) 4 MG tablet Take 1 tablet (4 mg total) by mouth every 8 (eight) hours as needed for nausea or vomiting. 40 tablet 0   phenylephrine (SUDAFED PE) 10 MG TABS tablet Take 10 mg by mouth every 4 (four) hours as needed.     predniSONE (STERAPRED UNI-PAK 21 TAB) 10 MG (21) TBPK tablet Take as directed 21 tablet 3   Sildenafil Citrate (VIAGRA PO) Take 30-60 mg by  mouth daily as needed (for erectile dysfunction). Purchased from [email or web address]--dose indicated on website as 30 mg     tamsulosin (FLOMAX) 0.4 MG CAPS capsule Take 0.4 mg by mouth daily.     testosterone enanthate (DELATESTRYL) 200 MG/ML injection Inject 200 mg into the muscle every 14 (fourteen) days. Fridays.     verapamil (CALAN-SR) 180 MG CR tablet Take 180 mg by mouth daily.  15   vitamin B-12 (CYANOCOBALAMIN) 100 MCG tablet Take 100 mcg by mouth daily.     No current facility-administered medications for this visit.    PHYSICAL EXAMINATION: ECOG PERFORMANCE STATUS: {CHL ONC ECOG PS:[id number]}  There were no vitals filed for this visit. There were no vitals filed for this visit.  BREAST:*** No palpable masses or nodules in either right or left breasts. No palpable axillary supraclavicular or infraclavicular adenopathy no breast tenderness or nipple discharge. (exam performed in the presence of a chaperone)  LABORATORY DATA:  I have reviewed the data as listed    Latest Ref Rng & Units 09/01/2022    4:12 PM 03/07/2021    7:47 AM 03/03/2021    8:00 AM  CMP  Glucose 70 - 99 mg/dL 96  99  101   BUN 8 - 23 mg/dL 29  18  21  Creatinine 0.61 - 1.24 mg/dL 1.45  0.98  0.87   Sodium 135 - 145 mmol/L 140  139  137   Potassium 3.5 - 5.1 mmol/L 4.9  4.2  5.7   Chloride 98 - 111 mmol/L 103  104  99   CO2 22 - 32 mmol/L 32  26  28   Calcium 8.9 - 10.3 mg/dL 10.0  8.9  8.8   Total Protein 6.5 - 8.1 g/dL 6.9     Total Bilirubin 0.3 - 1.2 mg/dL 1.2     Alkaline Phos 38 - 126 U/L 47     AST 15 - 41 U/L 32     ALT 0 - 44 U/L 37       Lab Results  Component Value Date   WBC 6.0 09/15/2022   HGB 17.0 09/15/2022   HCT 48.0 09/15/2022   MCV 95.0 09/15/2022   PLT 165 09/15/2022   NEUTROABS 3.8 09/15/2022    ASSESSMENT & PLAN:  No problem-specific Assessment & Plan notes found for this encounter.    No orders of the defined types were placed in this encounter.  The patient has a good  understanding of the overall plan. he agrees with it. he will call with any problems that may develop before the next visit here. Total time spent: 30 mins including face to face time and time spent for planning, charting and co-ordination of care   Suzzette Righter, [place] 11/16/22    I Gardiner Coins am acting as a Education administrator for [employer]  ***

## 2022-11-17 ENCOUNTER — Other Ambulatory Visit: Payer: Self-pay

## 2022-11-17 ENCOUNTER — Inpatient Hospital Stay: Payer: Medicare Other | Attending: Hematology and Oncology

## 2022-11-17 ENCOUNTER — Inpatient Hospital Stay: Payer: Medicare Other | Admitting: Hematology and Oncology

## 2022-11-17 ENCOUNTER — Inpatient Hospital Stay: Payer: Medicare Other

## 2022-11-17 VITALS — BP 135/71 | HR 63 | Temp 97.5°F | Resp 18 | Wt 192.1 lb

## 2022-11-17 DIAGNOSIS — E039 Hypothyroidism, unspecified: Secondary | ICD-10-CM

## 2022-11-17 DIAGNOSIS — Z79899 Other long term (current) drug therapy: Secondary | ICD-10-CM | POA: Insufficient documentation

## 2022-11-17 DIAGNOSIS — D751 Secondary polycythemia: Secondary | ICD-10-CM

## 2022-11-17 DIAGNOSIS — Z79890 Hormone replacement therapy (postmenopausal): Secondary | ICD-10-CM | POA: Insufficient documentation

## 2022-11-17 LAB — CBC WITH DIFFERENTIAL (CANCER CENTER ONLY)
Abs Immature Granulocytes: 0.07 10*3/uL (ref 0.00–0.07)
Basophils Absolute: 0.1 10*3/uL (ref 0.0–0.1)
Basophils Relative: 1 %
Eosinophils Absolute: 0.2 10*3/uL (ref 0.0–0.5)
Eosinophils Relative: 2 %
HCT: 46 % (ref 39.0–52.0)
Hemoglobin: 16.5 g/dL (ref 13.0–17.0)
Immature Granulocytes: 1 %
Lymphocytes Relative: 19 %
Lymphs Abs: 1.6 10*3/uL (ref 0.7–4.0)
MCH: 33.5 pg (ref 26.0–34.0)
MCHC: 35.9 g/dL (ref 30.0–36.0)
MCV: 93.5 fL (ref 80.0–100.0)
Monocytes Absolute: 0.6 10*3/uL (ref 0.1–1.0)
Monocytes Relative: 7 %
Neutro Abs: 5.6 10*3/uL (ref 1.7–7.7)
Neutrophils Relative %: 70 %
Platelet Count: 147 10*3/uL — ABNORMAL LOW (ref 150–400)
RBC: 4.92 MIL/uL (ref 4.22–5.81)
RDW: 13.3 % (ref 11.5–15.5)
WBC Count: 8 10*3/uL (ref 4.0–10.5)
nRBC: 0 % (ref 0.0–0.2)

## 2022-11-17 LAB — CMP (CANCER CENTER ONLY)
ALT: 47 U/L — ABNORMAL HIGH (ref 0–44)
AST: 33 U/L (ref 15–41)
Albumin: 4.2 g/dL (ref 3.5–5.0)
Alkaline Phosphatase: 55 U/L (ref 38–126)
Anion gap: 6 (ref 5–15)
BUN: 20 mg/dL (ref 8–23)
CO2: 30 mmol/L (ref 22–32)
Calcium: 9.2 mg/dL (ref 8.9–10.3)
Chloride: 102 mmol/L (ref 98–111)
Creatinine: 1.02 mg/dL (ref 0.61–1.24)
GFR, Estimated: >60 mL/min
Glucose, Bld: 104 mg/dL — ABNORMAL HIGH (ref 70–99)
Potassium: 4.2 mmol/L (ref 3.5–5.1)
Sodium: 138 mmol/L (ref 135–145)
Total Bilirubin: 0.9 mg/dL (ref 0.3–1.2)
Total Protein: 6.5 g/dL (ref 6.5–8.1)

## 2022-11-17 LAB — IRON AND IRON BINDING CAPACITY (CC-WL,HP ONLY)
Iron: 202 ug/dL — ABNORMAL HIGH (ref 45–182)
Saturation Ratios: 56 % — ABNORMAL HIGH (ref 17.9–39.5)
TIBC: 360 ug/dL (ref 250–450)
UIBC: 158 ug/dL (ref 117–376)

## 2022-11-17 LAB — FERRITIN: Ferritin: 157 ng/mL (ref 24–336)

## 2022-11-17 NOTE — Assessment & Plan Note (Addendum)
Lab review 07/30/2022: Testosterone 492, hemoglobin 18.5,, hematocrit 54.3, platelets 169, creatinine 0.94, LFTs normal 09/01/2022: Hemoglobin 18.7, hematocrit 54.9, platelets 157, ferritin 132, creatinine 1.45, iron saturation 65% 09/01/2022: MPN panel: Normal (no JAK2 mutation) 09/01/2022: Hemochromatosis gene testing:c.187C>G (p.His63Asp) - Detected, heterozygous    Therefore patient has secondary polycythemia Also has heterozygous hemochromatosis   Treatment plan: Phlebotomy with a goal of ferritin of 50   Phlebotomy: 09/08/2022   His hobby is setting up toy trains and building working models of the trains.    Low testosterone: Patient tells me that his testosterone dose has been decreased and since then he has been extremely fatigued and weak and tired.  I recommended that his testosterone dose be for back to his original dosage and we can manage his hemoglobin appropriately with phlebotomy treatments.  We checked his testosterone dose today as well.  I will call him tomorrow with the result of this test.  We will cancel his phlebotomy today and wait for the results of ferritin before scheduling another phlebotomy.   Recheck labs in 3 months and if the ferritin is greater than 50, schedule him for a phlebotomy.

## 2022-11-18 ENCOUNTER — Encounter: Payer: Self-pay | Admitting: Hematology and Oncology

## 2022-11-18 ENCOUNTER — Inpatient Hospital Stay (HOSPITAL_BASED_OUTPATIENT_CLINIC_OR_DEPARTMENT_OTHER): Payer: Medicare Other | Admitting: Hematology and Oncology

## 2022-11-18 DIAGNOSIS — R7989 Other specified abnormal findings of blood chemistry: Secondary | ICD-10-CM

## 2022-11-18 DIAGNOSIS — E038 Other specified hypothyroidism: Secondary | ICD-10-CM | POA: Diagnosis not present

## 2022-11-18 DIAGNOSIS — D751 Secondary polycythemia: Secondary | ICD-10-CM | POA: Diagnosis not present

## 2022-11-18 LAB — THYROID PANEL WITH TSH
Free Thyroxine Index: 2.2 (ref 1.2–4.9)
T3 Uptake Ratio: 29 % (ref 24–39)
T4, Total: 7.6 ug/dL (ref 4.5–12.0)
TSH: 1.51 u[IU]/mL (ref 0.450–4.500)

## 2022-11-18 LAB — TESTOSTERONE: Testosterone: 624 ng/dL (ref 264–916)

## 2022-11-18 NOTE — Assessment & Plan Note (Signed)
Elevated hemochromatosis also a reason for his elevated hemoglobin levels.  I discussed with him that I will send this message to his primary care doctors to go back to the original dose of testosterone since he feels extremely fatigued and weak.  We can manage his hemoglobin levels by adjusting the frequency of phlebotomy treatments.

## 2022-11-18 NOTE — Progress Notes (Signed)
HEMATOLOGY-ONCOLOGY TELEPHONE VISIT PROGRESS NOTE  I connected with our patient on 11/18/22 at  3:30 PM EST by telephone and verified that I am speaking with the correct person using two identifiers.  I discussed the limitations, risks, security and privacy concerns of performing an evaluation and management service by telephone and the availability of in person appointments.  I also discussed with the patient that there may be a patient responsible charge related to this service. The patient expressed understanding and agreed to proceed.   History of Present Illness:   Oncology History   No history exists.    REVIEW OF SYSTEMS:   Constitutional: Denies fevers, chills or abnormal weight loss All other systems were reviewed with the patient and are negative. 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   Phlebotomy: 09/08/2022 He has appointments for follow-up in May.  Hereditary hemochromatosis ([place]) Elevated hemochromatosis also a reason for his elevated hemoglobin levels.  I discussed with him that I will send this message to his primary care doctors to go back to the original dose of testosterone since he feels extremely fatigued and weak.  We can manage his hemoglobin levels by adjusting the  frequency of phlebotomy treatments.    I discussed the assessment and treatment plan with the patient. The patient was provided an opportunity to ask questions and all were answered. The patient agreed with the plan and demonstrated an understanding of the instructions. The patient was advised to call back or seek an in-person evaluation if the symptoms worsen or if the condition fails to improve as anticipated.   I provided 12 minutes of non-face-to-face time during this encounter.  This includes time for charting and coordination of care   Harriette Ohara, MD

## 2022-11-18 NOTE — Assessment & Plan Note (Signed)
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   Phlebotomy: 09/08/2022 He has appointments for follow-up in May.

## 2022-11-19 ENCOUNTER — Telehealth: Payer: Self-pay | Admitting: Hematology and Oncology

## 2022-11-19 NOTE — Telephone Encounter (Signed)
Scheduled appointment per 2/21 los. Patient is aware of the made appointment.

## 2022-11-24 ENCOUNTER — Other Ambulatory Visit: Payer: Self-pay | Admitting: *Deleted

## 2022-11-24 ENCOUNTER — Other Ambulatory Visit: Payer: Self-pay

## 2022-11-24 ENCOUNTER — Inpatient Hospital Stay: Payer: Medicare Other

## 2022-11-24 VITALS — BP 160/80 | HR 64 | Resp 16

## 2022-11-24 DIAGNOSIS — Z79890 Hormone replacement therapy (postmenopausal): Secondary | ICD-10-CM | POA: Diagnosis not present

## 2022-11-24 DIAGNOSIS — Z79899 Other long term (current) drug therapy: Secondary | ICD-10-CM | POA: Diagnosis not present

## 2022-11-24 DIAGNOSIS — D751 Secondary polycythemia: Secondary | ICD-10-CM

## 2022-11-24 NOTE — Progress Notes (Signed)
Larry [name] presents today for phlebotomy per MD orders. Phlebotomy procedure started at 1700 and ended at 1713. 510 grams removed via 18G Right AC.  Patient observed for 30 minutes after procedure without any incident. Patient tolerated procedure well. IV needle removed intact.

## 2022-11-24 NOTE — Patient Instructions (Signed)

## 2022-11-25 ENCOUNTER — Other Ambulatory Visit: Payer: Self-pay | Admitting: *Deleted

## 2023-01-05 DIAGNOSIS — R5383 Other fatigue: Secondary | ICD-10-CM | POA: Diagnosis not present

## 2023-01-05 DIAGNOSIS — R42 Dizziness and giddiness: Secondary | ICD-10-CM | POA: Diagnosis not present

## 2023-01-06 DIAGNOSIS — H2513 Age-related nuclear cataract, bilateral: Secondary | ICD-10-CM | POA: Diagnosis not present

## 2023-01-06 DIAGNOSIS — H52223 Regular astigmatism, bilateral: Secondary | ICD-10-CM | POA: Diagnosis not present

## 2023-01-11 DIAGNOSIS — R42 Dizziness and giddiness: Secondary | ICD-10-CM | POA: Diagnosis not present

## 2023-01-11 DIAGNOSIS — H903 Sensorineural hearing loss, bilateral: Secondary | ICD-10-CM | POA: Diagnosis not present

## 2023-01-11 DIAGNOSIS — H6691 Otitis media, unspecified, right ear: Secondary | ICD-10-CM | POA: Diagnosis not present

## 2023-01-12 ENCOUNTER — Encounter: Payer: Self-pay | Admitting: Orthopaedic Surgery

## 2023-01-12 ENCOUNTER — Ambulatory Visit: Payer: Medicare Other | Admitting: Orthopaedic Surgery

## 2023-01-12 ENCOUNTER — Other Ambulatory Visit (INDEPENDENT_AMBULATORY_CARE_PROVIDER_SITE_OTHER): Payer: Medicare Other

## 2023-01-12 DIAGNOSIS — M7912 Myalgia of auxiliary muscles, head and neck: Secondary | ICD-10-CM | POA: Diagnosis not present

## 2023-01-12 DIAGNOSIS — M5417 Radiculopathy, lumbosacral region: Secondary | ICD-10-CM | POA: Diagnosis not present

## 2023-01-12 DIAGNOSIS — M79641 Pain in right hand: Secondary | ICD-10-CM

## 2023-01-12 MED ORDER — PREDNISONE 10 MG (21) PO TBPK: ORAL_TABLET | ORAL | 3 refills | Status: AC

## 2023-01-12 NOTE — Progress Notes (Signed)
Office Visit Note   Patient: Larry [name]           Date of Birth: 05-10-1951           MRN: [id number] Visit Date: 01/12/2023              Requested by: Georgianne Fick, MD [address] [place],  [place] [address] PCP: Georgianne Fick, MD   Assessment & Plan: Visit Diagnoses:  1. 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  He does have bone-on-bone arthritis.  I explained the various treatment options and he would like to try a course of steroids.  He may decide that he wants a referral to hand surgeon to discuss surgical options.  Follow-Up Instructions: No follow-ups on file.   Orders:  Orders Placed This Encounter  Procedures   XR Hand Complete Right   No orders of the defined types were placed in this encounter.     Procedures: No procedures performed   Clinical Data: No additional findings.   Subjective: Chief Complaint  Patient presents with   Right Hand - Pain    HPI  Larry [name] comes in today for evaluation of right hand pain.  Has noticed pain and swelling surrounding the second MCP joint.  Denies any injuries and denies history of gout.  Review of Systems  Constitutional: Negative.   HENT: Negative.    Eyes: Negative.   Respiratory: Negative.    Cardiovascular: Negative.   Gastrointestinal: Negative.   Endocrine: Negative.   Genitourinary: Negative.   Skin: Negative.   Allergic/Immunologic: Negative.   Neurological: Negative.   Hematological: Negative.   Psychiatric/Behavioral: Negative.    All other systems reviewed and are negative.    Objective: Vital Signs: There were no vitals taken for this visit.  Physical Exam Vitals and nursing note reviewed.  Constitutional:      Appearance: He is well-developed.  Pulmonary:     Effort: Pulmonary effort is normal.  Abdominal:     Palpations: Abdomen is soft.  Skin:    General: Skin is warm.   Neurological:     Mental Status: He is alert and oriented to person, place, and time.  Psychiatric:        Behavior: Behavior normal.        Thought Content: Thought content normal.        Judgment: Judgment normal.    Ortho Exam  Examination of the right hand shows diffuse swelling about the second MCP joint on the dorsal aspect.  There is no cellulitis.  There is slight redness.  Slight tender to palpation.  Specialty Comments:  No specialty comments available.  Imaging: No results found.   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 05/07/2020   Osteoarthritis of left AC (acromioclavicular) joint 05/07/2020   Impingement syndrome of left shoulder 02/08/2020   S/P TKR (total knee replacement) using cement, left 10/03/2019   Cellulitis of arm 08/28/2019   Essential  hypertension 02/16/2018   Sepsis due to skin infection 02/16/2018   Chronic pain 02/16/2018   Cellulitis of left lower extremity    Lumbar radiculopathy, chronic 01/24/2018   Lumbar stenosis with neurogenic claudication 03/23/2017   Hypothyroidism 07/25/2015   Primary osteoarthritis of right knee 07/23/2015   Primary osteoarthritis of knee 07/23/2015   Past Medical History:  Diagnosis Date   Arthritis    "knees; left shoulder" (04/12/2013)   Asthma    "as a child" (04/12/2013)   DDD (degenerative disc disease), cervical    DDD (degenerative disc disease), lumbar    Difficult intubation    2012    CERVICAL FUSION    Headache(784.0)    VERAPAMIL FOR PREVENTION    Hemochromatosis    "defective gene" (04/12/2013); 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 ago, just gets jittery   Hypothyroidism    Lumbar stenosis    MRSA (methicillin resistant staph aureus) culture positive 09/10/2019   upper arm   OSA (obstructive sleep apnea)    "haven't wore mask in years; had OR; still snore" (04/12/2013)   Urinary frequency     No family history on file.  Past Surgical History:  Procedure Laterality Date   BACK SURGERY     CARPOMETACARPEL SUSPENSION PLASTY Left 07/19/2020   Procedure: LEFT THUMB CARPOMETACARPAL (CMC) ARTHROPLASTY;  Surgeon: Tarry Kos, MD;  Location: [facility];  Service: Orthopedics;  Laterality: Left;   CERVICAL FUSION  2002; 2012   CYSTECTOMY     INGUINAL HERNIA REPAIR Bilateral 1997   KNEE ARTHROSCOPY Left ~ 1997; ~ 2005   LUMBAR FUSION  2007; 04/11/2013   MICROLARYNGOSCOPY N/A 03/07/2021   Procedure: DIRECT MICROLARYNGOSCOPY WITH BIOPSY;  Surgeon: Drema Halon, MD;  Location:  SUR[facility]ERY [facility];  Service: ENT;  Laterality: N/A;   PENILE DEBRIDEMENT  ~ 2011   X 3  FOR MRSA INFECTION     SHOULDER ARTHROSCOPY Left ~ 2003   TONSILLECTOMY  1990's   TOTAL KNEE ARTHROPLASTY Right 07/23/2015   TOTAL KNEE ARTHROPLASTY Right 07/23/2015   Procedure: TOTAL KNEE ARTHROPLASTY;  Surgeon: Valeria Batman, MD;  Location: [facility];  Service: Orthopedics;  Laterality: Right;   TOTAL KNEE ARTHROPLASTY Left 10/03/2019   Procedure: LEFT TOTAL KNEE ARTHROPLASTY;  Surgeon: Valeria Batman, MD;  [facility];  Service: Orthopedics;  Laterality: Left;   UVULOPALATOPHARYNGOPLASTY (UPPP)/TONSILLECTOMY/SEPTOPLASTY  1990's   VASECTOMY     Social History   Occupational History   Not on file  Tobacco Use   Smoking status:  Former    Packs/day: 0.50    Years: 15.00    Additional pack years: 0.00    Total pack years: 7.50    Types: Cigarettes    Quit date: 10/14/1986    Years since quitting: 36.2   Smokeless tobacco: Never  Vaping Use   Vaping Use: Never used  Substance and Sexual Activity   Alcohol use: Yes    Comment: occasional   Drug use: No   Sexual activity: Yes

## 2023-01-13 ENCOUNTER — Ambulatory Visit: Payer: Medicare Other | Attending: Physician Assistant

## 2023-01-13 DIAGNOSIS — R2681 Unsteadiness on feet: Secondary | ICD-10-CM | POA: Diagnosis not present

## 2023-01-13 DIAGNOSIS — R42 Dizziness and giddiness: Secondary | ICD-10-CM

## 2023-01-13 NOTE — Therapy (Signed)
OUTPATIENT PHYSICAL THERAPY VESTIBULAR EVALUATION     Patient Name: Larry [name] MRN: [id number] DOB:Dec 08, 1950, 72 y.o., male Today's Date: 01/13/2023  END OF SESSION:  PT End of Session - 01/13/23 0818     Visit Number 1    Number of Visits 9    Date for PT Re-Evaluation 02/17/23    Authorization Type [employer] medicare    PT Start Time [phone]    PT Stop Time 0934    PT Time Calculation (min) 51 min    Activity Tolerance Patient tolerated treatment well    Behavior During Therapy [facility] for tasks assessed/performed             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 ago, just gets jittery   Hypothyroidism    Lumbar stenosis    MRSA (methicillin resistant staph aureus) culture positive 09/10/2019   upper arm   OSA (obstructive sleep apnea)    "haven't wore mask in years; had OR; still snore" (04/12/2013)   Urinary frequency    Past Surgical History:  Procedure Laterality Date   BACK SURGERY     CARPOMETACARPEL SUSPENSION PLASTY Left 07/19/2020   Procedure: LEFT THUMB CARPOMETACARPAL (CMC) ARTHROPLASTY;  Surgeon: Tarry Kos, MD;  Location: [facility];  Service: Orthopedics;  Laterality: Left;   CERVICAL FUSION  2002; 2012   CYSTECTOMY     INGUINAL HERNIA REPAIR Bilateral 1997   KNEE ARTHROSCOPY Left ~ 1997; ~ 2005   LUMBAR FUSION  2007; 04/11/2013   MICROLARYNGOSCOPY N/A 03/07/2021   Procedure: DIRECT MICROLARYNGOSCOPY WITH BIOPSY;  Surgeon: Drema Halon, MD;  Location: [facility];  Service: ENT;  Laterality: N/A;   PENILE DEBRIDEMENT  ~ 2011   X 3  FOR MRSA INFECTION     SHOULDER ARTHROSCOPY Left ~ 2003   TONSILLECTOMY  1990's   TOTAL KNEE ARTHROPLASTY Right 07/23/2015   TOTAL KNEE ARTHROPLASTY Right 07/23/2015   Procedure: TOTAL KNEE ARTHROPLASTY;  Surgeon: Valeria Batman, MD;  Location: [facility];  Service: Orthopedics;  Laterality: Right;   TOTAL KNEE ARTHROPLASTY Left 10/03/2019   Procedure: LEFT TOTAL KNEE ARTHROPLASTY;  Surgeon: Valeria Batman, MD;  Location: [facility];  Service: Orthopedics;  Laterality: Left;   UVULOPALATOPHARYNGOPLASTY (UPPP)/TONSILLECTOMY/SEPTOPLASTY  1990's   VASECTOMY     Patient Active Problem List   Diagnosis Date Noted   Polycythemia, secondary 09/01/2022   Hereditary hemochromatosis 09/01/2022   Left carpal tunnel syndrome 07/07/2022   Acute thigh pain, right 05/01/2021   CMC (carpometacarpal joint) dislocation, left, initial encounter 11/12/2020   Post-operative state 08/20/2020   Nontraumatic incomplete tear of left rotator cuff 08/20/2020   AC (acromioclavicular) arthritis 08/20/2020   Complete tear of left rotator cuff 07/09/2020   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 05/07/2020   Osteoarthritis of left AC (acromioclavicular)  joint 05/07/2020   Impingement syndrome of left shoulder 02/08/2020   S/P TKR (total knee replacement) using cement, left 10/03/2019   Cellulitis of arm 08/28/2019   Essential hypertension 02/16/2018   Sepsis due to skin infection 02/16/2018   Chronic pain 02/16/2018   Cellulitis of left lower extremity    Lumbar radiculopathy, chronic 01/24/2018   Lumbar stenosis with neurogenic claudication 03/23/2017   Hypothyroidism 07/25/2015   Primary osteoarthritis of right knee 07/23/2015   Primary osteoarthritis of knee 07/23/2015    PCP: Georgianne Fick, MD REFERRING PROVIDER: Vivianne Master, PA  REFERRING DIAG:  R42 (ICD-10-CM) - Dizziness and giddiness  THERAPY DIAG:  Unsteadiness on feet  Dizziness and giddiness  ONSET DATE: 01/12/2023 referral  Rationale for Evaluation and Treatment: Rehabilitation  SUBJECTIVE:   SUBJECTIVE STATEMENT: Patient arrives to clinic alone with no AD. Reports this started when he stood up from a barstool at home and the room started spinning. Reports greatest issue is getting in/out of bed and turning over in bed. Driving is no issue, but being a passenger can be provoking. Reports increase in indigestion recently. This has all been going on for ~3 weeks. Denies any overt MOI.  Pt accompanied by: self  PERTINENT HISTORY: Arthritis  Glaucoma  Hearing loss  Hypothyroid  OSA (obstructive sleep apnea) Multiple C-spine sxs (fusion) 5x back sx  PAIN:  Are you having pain? No  PRECAUTIONS: Fall  WEIGHT BEARING RESTRICTIONS: No  FALLS: Has patient fallen in last 6 months? No  LIVING ENVIRONMENT: Lives with: lives with their family Lives in: House/apartment Stairs: No Has following equipment at home: None  PLOF: Independent  PATIENT GOALS: "to not be dizzy"  OBJECTIVE:  COGNITION: Overall cognitive status: Within functional limits for tasks assessed   SENSATION: WFL  POSTURE:  rounded shoulders, forward head, increased thoracic kyphosis, posterior pelvic tilt, and flexed trunk   Cervical ROM:   Slightly limited due to c-spine fusion  STRENGTH: WFL   BED MOBILITY:  Independent, but causes dizziness  GAIT: Gait pattern: WFL  PATIENT SURVEYS:  FOTO 49, expected to be 61  VESTIBULAR ASSESSMENT:  GENERAL OBSERVATION: NAD   SYMPTOM BEHAVIOR:  Subjective history: see above  Non-Vestibular symptoms: nausea/vomiting  Type of dizziness: Spinning/Vertigo and "World moves"  Frequency: daily  Duration: seconds  Aggravating factors: Induced by position change: lying supine, rolling to the left, and supine to sit and Induced by motion:  bending down to the ground and sitting in a moving car  Relieving factors: rest and slow movements  Progression of symptoms: better  OCULOMOTOR EXAM:  Ocular Alignment: normal  Ocular ROM: No Limitations  Spontaneous Nystagmus: absent  Gaze-Induced Nystagmus: absent  Smooth Pursuits: intact  Saccades: intact  Convergence/Divergence: <5 cm   VESTIBULAR - OCULAR REFLEX:   Slow VOR: Normal  VOR Cancellation: Normal  Head-Impulse Test: HIT Right: negative HIT Left: negative    POSITIONAL TESTING: Right Dix-Hallpike: no nystagmus Left Dix-Hallpike: no nystagmus and upbeating, left nystagmus Right Roll Test: no nystagmus Left Roll Test: no nystagmus and reports 2/5 dizzines Right Sidelying: no nystagmus Left Sidelying: no nystagmus  MOTION SENSITIVITY:  Motion Sensitivity Quotient Intensity: 0 = none, 1 = Lightheaded, 2 = Mild, 3 = Moderate, 4 = Severe, 5 = Vomiting  Intensity  1. Sitting to supine 0  2. Supine to L side 2  3. Supine to R side 0  4. Supine to sitting 2-3  5. L Hallpike-Dix 0  6. Up from L  2  7. R Hallpike-Dix 0  8. Up from R  0  9. Sitting, head tipped to L knee   10. Head up from L knee   11. Sitting, head tipped to R knee   12. Head up from R knee   13. Sitting head turns x5   14.Sitting head nods x5   15. In stance, 180 turn to L    16. In stance, 180 turn to R      VESTIBULAR TREATMENT:                                                                                                   N/A eval   PATIENT EDUCATION: Education details: PT POC, exam findings Person educated: Patient Education method: Explanation and Handouts Education comprehension: verbalized understanding and needs further education  HOME EXERCISE PROGRAM:  GOALS: Goals reviewed with patient? Yes  SHORT TERM GOALS: = LTG based on PT POC length  LONG TERM GOALS: Target date: 02/17/23  Pt will be independent with final HEP for improved symptoms  Baseline: to be  provided Goal status: INITIAL  2.  Patient will demonstrate (-) positional testing to indicate resolution of BPPV  Baseline: (+) Lt posterior canal canalithiasis Goal status: INITIAL  3.  Patient will improve FOTO score to >/= 61 to demonstrate improved function and symptom report Baseline: 49 Goal status: INITIAL  ASSESSMENT:  CLINICAL IMPRESSION: Patient is a 72 y.o. male who was seen today for physical therapy evaluation and treatment for dizziness. Vestibular exam grossly negative except for L dix hallpike. Required bed in trendelenburg due to limited cervical extension related to c-spine surgeries. L torsional upbeating nystagmus noted, very brisk on first assessment, lasting ~7s. On 2nd assessment, lasted ~5s and was less brisk. Due to time constraints, unable to treat with Epley today. He would benefit from skilled PT services to address the above mentioned deficits.    OBJECTIVE IMPAIRMENTS: dizziness.   ACTIVITY LIMITATIONS: bending, bed mobility, locomotion level, and caring for others  PARTICIPATION LIMITATIONS: meal prep, interpersonal relationship, driving, shopping, community activity, occupation, and yard work  PERSONAL FACTORS: Age and 1-2 comorbidities: see above  are also affecting patient's functional outcome.   REHAB POTENTIAL: Good  CLINICAL DECISION MAKING: Stable/uncomplicated  EVALUATION COMPLEXITY: Low   PLAN:  PT FREQUENCY: 2x/week  PT DURATION: 4 weeks  PLANNED INTERVENTIONS: Therapeutic exercises, Therapeutic activity, Neuromuscular re-education, Balance training, Gait training, Patient/Family education, Self Care, Joint mobilization, Stair training, Vestibular training, Canalith repositioning, Visual/preceptual remediation/compensation, Orthotic/Fit training, DME instructions, Aquatic Therapy, Manual therapy, and Re-evaluation  PLAN FOR NEXT SESSION: re-check L dix hallpike and tx   Westley Foots, PT, DPT, CBIS 01/13/2023, 10:34 AM

## 2023-01-14 ENCOUNTER — Ambulatory Visit: Payer: Medicare Other

## 2023-01-14 DIAGNOSIS — R2681 Unsteadiness on feet: Secondary | ICD-10-CM

## 2023-01-14 DIAGNOSIS — R42 Dizziness and giddiness: Secondary | ICD-10-CM | POA: Diagnosis not present

## 2023-01-14 NOTE — Therapy (Signed)
OUTPATIENT PHYSICAL THERAPY VESTIBULAR TREATMENT     Patient Name: Larry [name] MRN: [id number] DOB:1950-11-20, 72 y.o., male Today's Date: 01/14/2023  END OF SESSION:  PT End of Session - 01/14/23 0848     Visit Number 2    Number of Visits 9    Date for PT Re-Evaluation 02/17/23    Authorization Type [employer] medicare    PT Start Time [phone]    PT Stop Time 0920   BPPV tx   PT Time Calculation (min) 34 min    Activity Tolerance Patient tolerated treatment well    Behavior During Therapy WFL for tasks assessed/performed             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 ago, just gets jittery   Hypothyroidism    Lumbar stenosis    MRSA (methicillin resistant staph aureus) culture positive 09/10/2019   upper arm   OSA (obstructive sleep apnea)    "haven't wore mask in years; had OR; still snore" (04/12/2013)   Urinary frequency    Past Surgical History:  Procedure Laterality Date   BACK SURGERY     CARPOMETACARPEL SUSPENSION PLASTY Left 07/19/2020   Procedure: LEFT THUMB CARPOMETACARPAL (CMC) ARTHROPLASTY;  Surgeon: Tarry Kos, MD;  Location: [facility];  Service: Orthopedics;  Laterality: Left;   CERVICAL FUSION  2002; 2012   CYSTECTOMY     INGUINAL HERNIA REPAIR Bilateral 1997   KNEE ARTHROSCOPY Left ~ 1997; ~ 2005   LUMBAR FUSION  2007; 04/11/2013   MICROLARYNGOSCOPY N/A 03/07/2021   Procedure: DIRECT MICROLARYNGOSCOPY WITH BIOPSY;  Surgeon: Drema Halon, MD;  Location: [facility];  Service: ENT;  Laterality: N/A;   PENILE DEBRIDEMENT  ~ 2011   X 3  FOR MRSA INFECTION     SHOULDER ARTHROSCOPY Left ~ 2003   TONSILLECTOMY  1990's   TOTAL KNEE ARTHROPLASTY Right 07/23/2015   TOTAL KNEE ARTHROPLASTY Right 07/23/2015   Procedure: TOTAL KNEE ARTHROPLASTY;  Surgeon: Valeria Batman, MD;  Location: [facility];  Service: Orthopedics;  Laterality: Right;   TOTAL KNEE ARTHROPLASTY Left 10/03/2019   Procedure: LEFT TOTAL KNEE ARTHROPLASTY;  Surgeon: Valeria Batman, MD;  Location: [facility];  Service: Orthopedics;  Laterality: Left;   UVULOPALATOPHARYNGOPLASTY (UPPP)/TONSILLECTOMY/SEPTOPLASTY  1990's   VASECTOMY     Patient Active Problem List   Diagnosis Date Noted   Polycythemia, secondary 09/01/2022   Hereditary hemochromatosis 09/01/2022   Left carpal tunnel syndrome 07/07/2022   Acute thigh pain, right 05/01/2021   CMC (carpometacarpal joint) dislocation, left, initial encounter 11/12/2020   Post-operative state 08/20/2020   Nontraumatic incomplete tear of left rotator cuff 08/20/2020   AC (acromioclavicular) arthritis 08/20/2020   Complete tear of left rotator cuff 07/09/2020   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 05/07/2020   Osteoarthritis of  left AC (acromioclavicular) joint 05/07/2020   Impingement syndrome of left shoulder 02/08/2020   S/P TKR (total knee replacement) using cement, left 10/03/2019   Cellulitis of arm 08/28/2019   Essential hypertension 02/16/2018   Sepsis due to skin infection 02/16/2018   Chronic pain 02/16/2018   Cellulitis of left lower extremity    Lumbar radiculopathy, chronic 01/24/2018   Lumbar stenosis with neurogenic claudication 03/23/2017   Hypothyroidism 07/25/2015   Primary osteoarthritis of right knee 07/23/2015   Primary osteoarthritis of knee 07/23/2015    PCP: Georgianne Fick, MD REFERRING PROVIDER: Vivianne Master, PA  REFERRING DIAG:  R42 (ICD-10-CM) - Dizziness and giddiness  THERAPY DIAG:  Unsteadiness on feet  Dizziness and giddiness  ONSET DATE: 01/12/2023 referral  Rationale for Evaluation and Treatment: Rehabilitation  SUBJECTIVE:   SUBJECTIVE STATEMENT: Patient reports general improvement in symptoms. Does still have some dizziness with bending down and rolling over in bed. Denies falls/near falls.  Pt accompanied by: self  PERTINENT HISTORY: Arthritis  Glaucoma  Hearing loss  Hypothyroid  OSA (obstructive sleep apnea) Multiple C-spine sxs (fusion) 5x back sx  PAIN:  Are you having pain? No  PRECAUTIONS: Fall  PATIENT GOALS: "to not be dizzy"  VESTIBULAR ASSESSMENT:   POSITIONAL TESTING: Left Dix-Hallpike: upbeating, left nystagmus immediate onset, brisk lasting 5-7s    VESTIBULAR TREATMENT:                                                                                                   -L epley x1 (bed in trendelenburg): symptoms resolved upon recheck x2 -discussed implications of BPPV and provided printed info on BPPV and self-epley   PATIENT EDUCATION: Education details: exam findings, BPPV and self epley info Person educated: Patient Education method: Explanation and Handouts Education comprehension: verbalized understanding and needs further education  HOME EXERCISE PROGRAM:  GOALS: Goals reviewed with patient? Yes  SHORT TERM GOALS: = LTG based on PT POC length  LONG TERM GOALS: Target date: 02/17/23  Pt will be independent with final HEP for improved symptoms  Baseline: to be provided Goal status: INITIAL  2.  Patient will demonstrate (-) positional testing to indicate resolution of BPPV  Baseline: (+) Lt posterior canal canalithiasis Goal status: INITIAL  3.  Patient will improve FOTO score to >/= 61 to demonstrate improved function and symptom report Baseline: 49 Goal status: INITIAL  ASSESSMENT:  CLINICAL IMPRESSION: Patient seen for skilled PT session with  emphasis on rechecking L posterior canal canalithiasis. Initially with positive L dix hallpike with immediate onset lasting ~5-7s. Treated with Epley x1 and noted resolution of nystagmus. Provided patient with printed handouts on BPPV and the self epley. Continue POC as needed.   OBJECTIVE IMPAIRMENTS: dizziness.   ACTIVITY LIMITATIONS: bending, bed mobility, locomotion level, and caring for others  PARTICIPATION LIMITATIONS: meal prep, interpersonal relationship, driving, shopping, community activity, occupation, and yard work  PERSONAL FACTORS: Age and 1-2 comorbidities: see above  are also affecting patient's functional outcome.   REHAB POTENTIAL: Good  CLINICAL DECISION MAKING: Stable/uncomplicated  EVALUATION COMPLEXITY: Low   PLAN:  PT FREQUENCY:  2x/week  PT DURATION: 4 weeks  PLANNED INTERVENTIONS: Therapeutic exercises, Therapeutic activity, Neuromuscular re-education, Balance training, Gait training, Patient/Family education, Self Care, Joint mobilization, Stair training, Vestibular training, Canalith repositioning, Visual/preceptual remediation/compensation, Orthotic/Fit training, DME instructions, Aquatic Therapy, Manual therapy, and Re-evaluation  PLAN FOR NEXT SESSION: re-check L dix hallpike and tx   Westley Foots, PT, DPT, CBIS 01/14/2023, 9:52 AM

## 2023-01-19 ENCOUNTER — Ambulatory Visit: Payer: Medicare Other

## 2023-01-21 ENCOUNTER — Ambulatory Visit: Payer: Medicare Other

## 2023-01-21 DIAGNOSIS — R42 Dizziness and giddiness: Secondary | ICD-10-CM | POA: Diagnosis not present

## 2023-01-21 DIAGNOSIS — R2681 Unsteadiness on feet: Secondary | ICD-10-CM | POA: Diagnosis not present

## 2023-01-21 NOTE — Therapy (Signed)
OUTPATIENT PHYSICAL THERAPY VESTIBULAR TREATMENT     Patient Name: FRAZIER BALFOUR MRN: [id number] DOB:1950-11-08, 72 y.o., male Today's Date: 01/21/2023  END OF SESSION:  PT End of Session - 01/21/23 1518     Visit Number 3    Number of Visits 9    Date for PT Re-Evaluation 02/17/23    Authorization Type [employer] medicare    PT Start Time 1528    PT Stop Time 1606   met goals of session   PT Time Calculation (min) 38 min    Activity Tolerance Patient tolerated treatment well    Behavior During Therapy WFL for tasks assessed/performed             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 ago, just gets jittery   Hypothyroidism    Lumbar stenosis    MRSA (methicillin resistant staph aureus) culture positive 09/10/2019   upper arm   OSA (obstructive sleep apnea)    "haven't wore mask in years; had OR; still snore" (04/12/2013)   Urinary frequency    Past Surgical History:  Procedure Laterality Date   BACK SURGERY     CARPOMETACARPEL SUSPENSION PLASTY Left 07/19/2020   Procedure: LEFT THUMB CARPOMETACARPAL (CMC) ARTHROPLASTY;  Surgeon: Tarry Kos, MD;  Location: [facility];  Service: Orthopedics;  Laterality: Left;   CERVICAL FUSION  2002; 2012   CYSTECTOMY     INGUINAL HERNIA REPAIR Bilateral 1997   KNEE ARTHROSCOPY Left ~ 1997; ~ 2005   LUMBAR FUSION  2007; 04/11/2013   MICROLARYNGOSCOPY N/A 03/07/2021   Procedure: DIRECT MICROLARYNGOSCOPY WITH BIOPSY;   Surgeon: Drema Halon, MD;  Location: [facility];  Service: ENT;  Laterality: N/A;   PENILE DEBRIDEMENT  ~ 2011   X 3  FOR MRSA INFECTION     SHOULDER ARTHROSCOPY Left ~ 2003   TONSILLECTOMY  1990's   TOTAL KNEE ARTHROPLASTY Right 07/23/2015   TOTAL KNEE ARTHROPLASTY Right 07/23/2015   Procedure: TOTAL KNEE ARTHROPLASTY;  Surgeon: Valeria Batman, MD;  Location[facility];  Service: Orthopedics;  Laterality: Right;   TOTAL KNEE ARTHROPLASTY Left 10/03/2019   Procedure: LEFT TOTAL KNEE ARTHROPLASTY;  Surgeon: Valeria Batman, MD;  Location: [facility];  Service: Orthopedics;  Laterality: Left;   UVULOPALATOPHARYNGOPLASTY (UPPP)/TONSILLECTOMY/SEPTOPLASTY  1990's   VASECTOMY     Patient Active Problem List   Diagnosis Date Noted   Polycythemia, secondary 09/01/2022   Hereditary hemochromatosis 09/01/2022   Left carpal tunnel syndrome 07/07/2022   Acute thigh pain, right 05/01/2021   CMC (carpometacarpal joint) dislocation, left, initial encounter 11/12/2020   Post-operative state 08/20/2020   Nontraumatic incomplete tear of left rotator cuff 08/20/2020   AC (acromioclavicular) arthritis 08/20/2020   Complete tear of left rotator cuff 07/09/2020   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 05/07/2020  Osteoarthritis of left AC (acromioclavicular) joint 05/07/2020   Impingement syndrome of left shoulder 02/08/2020   S/P TKR (total knee replacement) using cement, left 10/03/2019   Cellulitis of arm 08/28/2019   Essential hypertension 02/16/2018   Sepsis due to skin infection 02/16/2018   Chronic pain 02/16/2018   Cellulitis of left lower extremity    Lumbar radiculopathy, chronic 01/24/2018   Lumbar stenosis with neurogenic claudication 03/23/2017   Hypothyroidism 07/25/2015   Primary osteoarthritis of right knee 07/23/2015   Primary osteoarthritis of knee 07/23/2015    PCP: Georgianne Fick, MD REFERRING PROVIDER: Vivianne Master,  PA  REFERRING DIAG: R42 (ICD-10-CM) - Dizziness and giddiness  THERAPY DIAG:  Unsteadiness on feet  Dizziness and giddiness  ONSET DATE: 01/12/2023 referral  Rationale for Evaluation and Treatment: Rehabilitation  SUBJECTIVE:   SUBJECTIVE STATEMENT: Patient reports doing well. Did have a few days of feeling dizzy, but not profound. He did lay on his back and tip his head back and he felt spinning dizziness then. Denies falls/near falls.  Pt accompanied by: self  PERTINENT HISTORY: Arthritis  Glaucoma  Hearing loss  Hypothyroid  OSA (obstructive sleep apnea) Multiple C-spine sxs (fusion) 5x back sx  PAIN:  Are you having pain? No  PRECAUTIONS: Fall  PATIENT GOALS: "to not be dizzy"  VESTIBULAR ASSESSMENT:   POSITIONAL TESTING: Right Dix-Hallpike: no nystagmus Left Dix-Hallpike: no nystagmus  VESTIBULAR TREATMENT:                                                                                                   -provided patient with habituation exercises per patient subjective report of when he intermittently experiences dizziness  PATIENT EDUCATION: Education details: exam findings, HEP  Person educated: Patient Education method: Explanation and Handouts Education comprehension: verbalized understanding and needs further education  HOME EXERCISE PROGRAM:  GOALS:Bending / Picking Up Objects    Sitting, slowly bend head down and pick up object on the floor. Return to upright position. Hold position until symptoms subside. Repeat ___4_ times per session per side. Do _2-3___ sessions per day.  Sit to Side-Lying    Sit on edge of bed. 1. Turn head 45 to right. 2. Maintain head position and lie down slowly on left side. Hold until symptoms subside. 3. Sit up slowly. Hold until symptoms subside. 4. Turn head 45 to left. 5. Maintain head position and lie down slowly on right side. Hold until symptoms subside. 6. Sit up slowly.  Goals reviewed with patient?  Yes  SHORT TERM GOALS: = LTG based on PT POC length  LONG TERM GOALS: Target date: 02/17/23  Pt will be independent with final HEP for improved symptoms  Baseline: to be provided Goal status: INITIAL  2.  Patient will demonstrate (-) positional testing to indicate resolution of BPPV  Baseline: (+) Lt posterior canal canalithiasis Goal status: INITIAL  3.  Patient will improve FOTO score to >/= 61 to demonstrate improved function and symptom report Baseline: 49 Goal status: INITIAL  ASSESSMENT:  CLINICAL IMPRESSION: Patient seen for skilled PT session with emphasis on addressing his BPPV.  Patient with no nystagmus and no reports of dizziness in dix hall pike (right or left). Patient reports intermittent dizziness with bending down and laying down/sitting up, though PT unable to recreate this with sessions completed. Continue POC as able.   OBJECTIVE IMPAIRMENTS: dizziness.   ACTIVITY LIMITATIONS: bending, bed mobility, locomotion level, and caring for others  PARTICIPATION LIMITATIONS: meal prep, interpersonal relationship, driving, shopping, community activity, occupation, and yard work  PERSONAL FACTORS: Age and 1-2 comorbidities: see above  are also affecting patient's functional outcome.   REHAB POTENTIAL: Good  CLINICAL DECISION MAKING: Stable/uncomplicated  EVALUATION COMPLEXITY: Low   PLAN:  PT FREQUENCY: 2x/week  PT DURATION: 4 weeks  PLANNED INTERVENTIONS: Therapeutic exercises, Therapeutic activity, Neuromuscular re-education, Balance training, Gait training, Patient/Family education, Self Care, Joint mobilization, Stair training, Vestibular training, Canalith repositioning, Visual/preceptual remediation/compensation, Orthotic/Fit training, DME instructions, Aquatic Therapy, Manual therapy, and Re-evaluation  PLAN FOR NEXT SESSION: re-check L dix hallpike and tx   Westley Foots, PT, DPT, CBIS 01/21/2023, 4:16 PM

## 2023-01-27 ENCOUNTER — Ambulatory Visit: Payer: Medicare Other

## 2023-02-05 DIAGNOSIS — E039 Hypothyroidism, unspecified: Secondary | ICD-10-CM | POA: Diagnosis not present

## 2023-02-05 DIAGNOSIS — R7303 Prediabetes: Secondary | ICD-10-CM | POA: Diagnosis not present

## 2023-02-05 DIAGNOSIS — E291 Testicular hypofunction: Secondary | ICD-10-CM | POA: Diagnosis not present

## 2023-02-05 DIAGNOSIS — E7800 Pure hypercholesterolemia, unspecified: Secondary | ICD-10-CM | POA: Diagnosis not present

## 2023-02-08 ENCOUNTER — Inpatient Hospital Stay: Payer: Medicare Other | Attending: Hematology and Oncology

## 2023-02-08 DIAGNOSIS — R7989 Other specified abnormal findings of blood chemistry: Secondary | ICD-10-CM

## 2023-02-08 DIAGNOSIS — D751 Secondary polycythemia: Secondary | ICD-10-CM

## 2023-02-08 DIAGNOSIS — E038 Other specified hypothyroidism: Secondary | ICD-10-CM

## 2023-02-08 LAB — CBC WITH DIFFERENTIAL (CANCER CENTER ONLY)
Abs Immature Granulocytes: 0.06 10*3/uL (ref 0.00–0.07)
Basophils Absolute: 0 10*3/uL (ref 0.0–0.1)
Basophils Relative: 0 %
Eosinophils Absolute: 0.1 10*3/uL (ref 0.0–0.5)
Eosinophils Relative: 2 %
HCT: 43.8 % (ref 39.0–52.0)
Hemoglobin: 15.4 g/dL (ref 13.0–17.0)
Immature Granulocytes: 1 %
Lymphocytes Relative: 20 %
Lymphs Abs: 1.3 10*3/uL (ref 0.7–4.0)
MCH: 33.1 pg (ref 26.0–34.0)
MCHC: 35.2 g/dL (ref 30.0–36.0)
MCV: 94.2 fL (ref 80.0–100.0)
Monocytes Absolute: 0.5 10*3/uL (ref 0.1–1.0)
Monocytes Relative: 7 %
Neutro Abs: 4.8 10*3/uL (ref 1.7–7.7)
Neutrophils Relative %: 70 %
Platelet Count: 143 10*3/uL — ABNORMAL LOW (ref 150–400)
RBC: 4.65 MIL/uL (ref 4.22–5.81)
RDW: 13.9 % (ref 11.5–15.5)
WBC Count: 6.9 10*3/uL (ref 4.0–10.5)
nRBC: 0 % (ref 0.0–0.2)

## 2023-02-08 LAB — IRON AND IRON BINDING CAPACITY (CC-WL,HP ONLY)
Iron: 129 ug/dL (ref 45–182)
Saturation Ratios: 31 % (ref 17.9–39.5)
TIBC: 410 ug/dL (ref 250–450)
UIBC: 281 ug/dL (ref 117–376)

## 2023-02-09 DIAGNOSIS — E291 Testicular hypofunction: Secondary | ICD-10-CM | POA: Diagnosis not present

## 2023-02-09 DIAGNOSIS — R7303 Prediabetes: Secondary | ICD-10-CM | POA: Diagnosis not present

## 2023-02-09 DIAGNOSIS — E039 Hypothyroidism, unspecified: Secondary | ICD-10-CM | POA: Diagnosis not present

## 2023-02-09 DIAGNOSIS — I10 Essential (primary) hypertension: Secondary | ICD-10-CM | POA: Diagnosis not present

## 2023-02-09 LAB — FERRITIN: Ferritin: 55 ng/mL (ref 24–336)

## 2023-02-09 LAB — TESTOSTERONE: Testosterone: 791 ng/dL (ref 264–916)

## 2023-02-09 NOTE — Assessment & Plan Note (Signed)
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    Hereditary hemochromatosis (HCC) Elevated hemochromatosis also a reason for his elevated hemoglobin levels.  Repeat labs in 3 months and follow-up after

## 2023-02-09 NOTE — Progress Notes (Signed)
Patient Care Team: Georgianne Fick, MD as PCP - General (Internal Medicine)  DIAGNOSIS:  Encounter Diagnosis  Name Primary?   Polycythemia, secondary Yes     CHIEF COMPLIANT: Follow-up of hemochromatosis and polycythemia   INTERVAL HISTORY: Larry [name] is a 72 year old above-mentioned history of hemochromatosis and polycythemia who is on phlebotomy. He presents to the clinic for a follow-up.  He reports no new problems or concerns.  He is back on full dose of testosterone and today his hemoglobin appears to be 15.4.  He has not required any phlebotomy since February.  His ferritin is also stable at 55.   ALLERGIES:  is allergic to gabapentin.  MEDICATIONS:  Current Outpatient Medications  Medication Sig Dispense Refill   amoxicillin (AMOXIL) 500 MG capsule TAKE ALL 4 TABLETS 1 HOUR PRIOR TO DENTAL APPOINTMENT 12 capsule 0   amoxicillin (AMOXIL) 500 MG tablet Take 4 tablets by mouth 1 hour prior to dental procedure. 12 tablet TABLET   fluticasone (FLONASE) 50 MCG/ACT nasal spray SPRAY 1 SPRAY INTO EACH NOSTRIL EVERY DAY for 90 days     furosemide (LASIX) 40 MG tablet Take 40 mg by mouth daily as needed for fluid or edema.      hydrochlorothiazide (HYDRODIURIL) 25 MG tablet Take 25 mg by mouth every morning.     HYDROcodone-acetaminophen (NORCO/VICODIN) 5-325 MG tablet Take 1 tablet by mouth 3 (three) times daily as needed for moderate pain. To be taken after surgery 15 tablet 0   ketorolac (TORADOL) 10 MG tablet Take 1 tablet (10 mg total) by mouth 2 (two) times daily as needed. 10 tablet 0   levothyroxine (SYNTHROID) 200 MCG tablet Take 200 mcg by mouth daily before breakfast.      LORazepam (ATIVAN) 0.5 MG tablet Take 0.5-1 mg by mouth at bedtime.   1   losartan (COZAAR) 50 MG tablet Take 50 mg by mouth daily.      meloxicam (MOBIC) 15 MG tablet Take 15 mg by mouth daily.      Multiple Vitamin (MULTIVITAMIN WITH MINERALS) TABS tablet Take 1 tablet by mouth daily.      omeprazole (PRILOSEC) 40 MG capsule TAKE 1 CAPSULE EVERY DAY BEFORE DINNER 30 capsule 1   omeprazole (PRILOSEC) 40 MG capsule Take 1 capsule (40 mg total) by mouth daily. 30 capsule 6   ondansetron (ZOFRAN) 4 MG tablet Take 1 tablet (4 mg total) by mouth every 8 (eight) hours as needed for nausea or vomiting. 40 tablet 0   phenylephrine (SUDAFED PE) 10 MG TABS tablet Take 10 mg by mouth every 4 (four) hours as needed.     predniSONE (STERAPRED UNI-PAK 21 TAB) 10 MG (21) TBPK tablet Take as directed 21 tablet 3   predniSONE (STERAPRED UNI-PAK 21 TAB) 10 MG (21) TBPK tablet Take as directed 21 tablet 3   Sildenafil Citrate (VIAGRA PO) Take 30-60 mg by mouth daily as needed (for erectile dysfunction). Purchased from [employer]--dose indicated on website as 30 mg     tamsulosin (FLOMAX) 0.4 MG CAPS capsule Take 0.4 mg by mouth daily.     testosterone enanthate (DELATESTRYL) 200 MG/ML injection Inject 200 mg into the muscle every 14 (fourteen) days. Fridays.     verapamil (CALAN-SR) 180 MG CR tablet Take 180 mg by mouth daily.  15   vitamin B-12 (CYANOCOBALAMIN) 100 MCG tablet Take 100 mcg by mouth daily.     No current facility-administered medications for this visit.    PHYSICAL EXAMINATION: ECOG PERFORMANCE STATUS:  1 - Symptomatic but completely ambulatory  There were no vitals filed for this visit. There were no vitals filed for this visit.    LABORATORY DATA:  I have reviewed the data as listed    Latest Ref Rng & Units 11/17/2022    2:48 PM 09/01/2022    4:12 PM 03/07/2021    7:47 AM  CMP  Glucose 70 - 99 mg/dL 161  96  99   BUN 8 - 23 mg/dL 20  29  18    Creatinine 0.61 - 1.24 mg/dL 0.96  0.45  4.09   Sodium 135 - 145 mmol/L 138  140  139   Potassium 3.5 - 5.1 mmol/L 4.2  4.9  4.2   Chloride 98 - 111 mmol/L 102  103  104   CO2 22 - 32 mmol/L 30  32  26   Calcium 8.9 - 10.3 mg/dL 9.2  81.1  8.9   Total Protein 6.5 - 8.1 g/dL 6.5  6.9    Total Bilirubin 0.3 - 1.2 mg/dL 0.9  1.2     Alkaline Phos 38 - 126 U/L 55  47    AST 15 - 41 U/L 33  32    ALT 0 - 44 U/L 47  37      Lab Results  Component Value Date   WBC 6.9 02/08/2023   HGB 15.4 02/08/2023   HCT 43.8 02/08/2023   MCV 94.2 02/08/2023   PLT 143 (L) 02/08/2023   NEUTROABS 4.8 02/08/2023    ASSESSMENT & PLAN:  Polycythemia, secondary Lab review 07/30/2022: Testosterone 492, hemoglobin 18.5,, hematocrit 54.3, platelets 169, creatinine 0.94, LFTs normal 09/01/2022: Hemoglobin 18.7, hematocrit 54.9, platelets 157, ferritin 132, creatinine 1.45, iron saturation 65% 09/01/2022: MPN panel: Normal (no JAK2 mutation) 09/01/2022: Hemochromatosis gene testing:c.187C>G (p.His63Asp) - Detected, heterozygous  11/18/2022: Hemoglobin 16.5, hematocrit 46, testosterone 624, ferritin 157, TSH 1.5, iron saturation 56% 02/08/2023: Hemoglobin 15.4, hematocrit 43.8, platelets 143, testosterone 791, iron saturation 31%, ferritin 55   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  Developed Vertigo: got therapy   Hereditary hemochromatosis (HCC) Elevated hemochromatosis also a reason for his elevated hemoglobin levels.  Repeat labs in 3 months and follow-up after    No orders of the defined types were placed in this encounter.  The patient has a good understanding of the overall plan. he agrees with it. he will call with any problems that may develop before the next visit here. Total time spent: 30 mins including face to face time and time spent for planning, charting and co-ordination of care   Tamsen Meek, MD 02/10/23    I Janan Ridge am acting as a Neurosurgeon for [employer]  I have reviewed the above documentation for accuracy and completeness, and I agree with the above.

## 2023-02-10 ENCOUNTER — Inpatient Hospital Stay: Payer: Medicare Other | Admitting: Hematology and Oncology

## 2023-02-10 ENCOUNTER — Telehealth: Payer: Self-pay | Admitting: Hematology and Oncology

## 2023-02-10 DIAGNOSIS — D751 Secondary polycythemia: Secondary | ICD-10-CM | POA: Diagnosis not present

## 2023-02-10 NOTE — Telephone Encounter (Signed)
Scheduled appointment per 5/15 los. Patient is aware of the made appointments.

## 2023-02-17 NOTE — Therapy (Unsigned)
[facility] [address] [place], [place], [address] Phone: [phone]   Fax:  [phone]  Patient Details  Name: SHENANDOAH NORDLAND MRN: [id number] Date of Birth: 1951/05/06 Referring Provider:  No ref. provider found  Encounter Date: 02/17/2023  PHYSICAL THERAPY DISCHARGE SUMMARY  Visits from Start of Care: 3  Current functional level related to goals / functional outcomes: Resolution of BPPV   Remaining deficits: none   Education / Equipment: PT POC, BPPV, HEP   Patient agrees to discharge. Patient goals were met. Patient is being discharged due to meeting the stated rehab goals.  Westley Foots, PT, DPT, CBIS 02/17/2023, 9:45 AM  [facility] [address] [place], [place], [address] Phone: [phone]   Fax:  [phone]

## 2023-02-19 ENCOUNTER — Ambulatory Visit: Payer: Medicare Other | Attending: Physician Assistant | Admitting: Physical Therapy

## 2023-02-19 ENCOUNTER — Encounter: Payer: Self-pay | Admitting: Physical Therapy

## 2023-02-19 VITALS — BP 124/64 | HR 65

## 2023-02-19 DIAGNOSIS — R2681 Unsteadiness on feet: Secondary | ICD-10-CM | POA: Diagnosis not present

## 2023-02-19 DIAGNOSIS — R42 Dizziness and giddiness: Secondary | ICD-10-CM | POA: Insufficient documentation

## 2023-02-19 NOTE — Therapy (Signed)
OUTPATIENT PHYSICAL THERAPY VESTIBULAR RE-CERTIFICATION  Patient Name: Larry [name] MRN: [id number] DOB:03/30/1951, 72 y.o., male Today's Date: 02/19/2023  END OF SESSION:  PT End of Session - 02/19/23 0919     Visit Number 1    Number of Visits 9    Date for PT Re-Evaluation 04/02/23    Authorization Type BCBS medicare    PT Start Time [phone]    PT Stop Time 0954    PT Time Calculation (min) 36 min    Equipment Utilized During Treatment Gait belt    Activity Tolerance Patient tolerated treatment well    Behavior During Therapy WFL for tasks assessed/performed             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 ago, just gets jittery   Hypothyroidism    Lumbar stenosis    MRSA (methicillin resistant staph aureus) culture positive 09/10/2019   upper arm   OSA (obstructive sleep apnea)    "haven't wore mask in years; had OR; still snore" (04/12/2013)   Urinary frequency    Past Surgical History:  Procedure Laterality Date   BACK SURGERY     CARPOMETACARPEL SUSPENSION PLASTY Left 07/19/2020   Procedure: LEFT THUMB CARPOMETACARPAL (CMC) ARTHROPLASTY;  Surgeon: Tarry Kos, MD;  Location: [facility];  Service: Orthopedics;  Laterality: Left;   CERVICAL FUSION  2002; 2012   CYSTECTOMY     INGUINAL HERNIA REPAIR Bilateral 1997   KNEE ARTHROSCOPY Left ~ 1997; ~ 2005   LUMBAR FUSION  2007; 04/11/2013   MICROLARYNGOSCOPY N/A 03/07/2021   Procedure: DIRECT  MICROLARYNGOSCOPY WITH BIOPSY;  Surgeon: Drema Halon, MD;  Location: [facility];  Service: ENT;  Laterality: N/A;   PENILE DEBRIDEMENT  ~ 2011   X 3  FOR MRSA INFECTION     SHOULDER ARTHROSCOPY Left ~ 2003   TONSILLECTOMY  1990's   TOTAL KNEE ARTHROPLASTY Right 07/23/2015   TOTAL KNEE ARTHROPLASTY Right 07/23/2015   Procedure: TOTAL KNEE ARTHROPLASTY;  Surgeon: Valeria Batman, MD;  Location[facility];  Service: Orthopedics;  Laterality: Right;   TOTAL KNEE ARTHROPLASTY Left 10/03/2019   Procedure: LEFT TOTAL KNEE ARTHROPLASTY;  Surgeon: Valeria Batman, MD;  Location: [facility];  Service: Orthopedics;  Laterality: Left;   UVULOPALATOPHARYNGOPLASTY (UPPP)/TONSILLECTOMY/SEPTOPLASTY  1990's   VASECTOMY     Patient Active Problem List   Diagnosis Date Noted   Polycythemia, secondary 09/01/2022   Hereditary hemochromatosis (HCC) 09/01/2022   Left carpal tunnel syndrome 07/07/2022   Acute thigh pain, right 05/01/2021   CMC (carpometacarpal joint) dislocation, left, initial encounter 11/12/2020   Post-operative state 08/20/2020   Nontraumatic incomplete tear of left rotator cuff 08/20/2020   AC (acromioclavicular) arthritis 08/20/2020   Complete tear of left rotator cuff 07/09/2020   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 05/07/2020  Osteoarthritis of left AC (acromioclavicular) joint 05/07/2020   Impingement syndrome of left shoulder 02/08/2020   S/P TKR (total knee replacement) using cement, left 10/03/2019   Cellulitis of arm 08/28/2019   Essential hypertension 02/16/2018   Sepsis due to skin infection (HCC) 02/16/2018   Chronic pain 02/16/2018   Cellulitis of left lower extremity    Lumbar radiculopathy, chronic 01/24/2018   Lumbar stenosis with neurogenic claudication 03/23/2017   Hypothyroidism 07/25/2015   Primary osteoarthritis of right knee 07/23/2015   Primary osteoarthritis of knee 07/23/2015    PCP: Georgianne Fick,  MD REFERRING PROVIDER: Vivianne Master, PA  REFERRING DIAG: R42 (ICD-10-CM) - Dizziness and giddiness  THERAPY DIAG:  Unsteadiness on feet - Plan: PT plan of care cert/re-cert  Dizziness and giddiness - Plan: PT plan of care cert/re-cert  ONSET DATE: 01/12/2023 referral  Rationale for Evaluation and Treatment: Rehabilitation  SUBJECTIVE:   SUBJECTIVE STATEMENT: Patient was recently seen at this facility for L posterior canal BPPV. Patient reports that he has not had any true vertigo that he is aware of since last being here but has felt a little funny/off similar how he did last time. He returns hoping for an assessment to determine if symptoms have reoccurred. Denies falls/near falls.   Pt accompanied by: self  PERTINENT HISTORY: Arthritis  Glaucoma  Hearing loss  Hypothyroid  OSA (obstructive sleep apnea) Multiple C-spine sxs (fusion) 5x back sx  PAIN:  Are you having pain? No  PRECAUTIONS: Fall  PATIENT GOALS: "to figure out if my dizziness has come back."  VESTIBULAR ASSESSMENT:  GENERAL OBSERVATION: ambulates without AD, no major signs of instability   SYMPTOM BEHAVIOR:  Subjective history: see above  Cervical ROM limited in extension/sidelying  OCULOMOTOR EXAM:  Ocular Alignment: normal  Ocular ROM: No Limitations  Spontaneous Nystagmus: absent  Gaze-Induced Nystagmus: absent  Smooth Pursuits: intact  Saccades: intact  Convergence/Divergence: < 5 cm VBI: negative bilaterally   VESTIBULAR - OCULAR REFLEX:   Slow VOR: Normal  VOR Cancellation: Normal  Head-Impulse Test:  HIT Right: negative HIT Left: negative   POSITIONAL TESTING:  Utilized 4" blocks under mat to place mat into trendelenburg position and pillow under thoracic spine to compensate for reduced cervical ROM.   Right Dix-Hallpike: no nystagmus Left Dix-Hallpike: upbeating, left nystagmus Right Roll Test: no nystagmus Left Roll Test: no nystagmus  Positional testing: L upbeating  torsional nystagmus noted with less than 5" latency and duration ~10"    VESTIBULAR TREATMENT:                                                                                                    L Epley Maneuver with modified trendelenburg positioning treated 2x. Duration on nystagmus on intial treatment noted above; brief wave of dizziness noted in position 3, none when returned to upright. Improved on second rep. Retested L side with Dix-Hallpike after second Epley rep and no nystagmus or subjective report of dizziness. Returned patient to upright.   PATIENT EDUCATION: Education details: exam findings, education on BPPV Person educated: Patient Education method: Explanation and Handouts  Education comprehension: verbalized understanding and needs further education  HOME EXERCISE PROGRAM:  GOALS:Bending / Picking Up Objects    Sitting, slowly bend head down and pick up object on the floor. Return to upright position. Hold position until symptoms subside. Repeat ___4_ times per session per side. Do _2-3___ sessions per day.  Sit to Side-Lying    Sit on edge of bed. 1. Turn head 45 to right. 2. Maintain head position and lie down slowly on left side. Hold until symptoms subside. 3. Sit up slowly. Hold until symptoms subside. 4. Turn head 45 to left. 5. Maintain head position and lie down slowly on right side. Hold until symptoms subside. 6. Sit up slowly.  Goals reviewed with patient? Yes  SHORT TERM GOALS: = LTG based on PT POC length  LONG TERM GOALS: Target date: 02/17/23    3.  LONG TERM GOALS: Target date: 03/19/2023 (With new Re-cert)  1.  Patient will demonstrate (-) positional testing to indicate resolution of BPPV  Baseline: (+) Lt posterior canal canalithiasis Goal status: INITIAL  2. Patient will improve FOTO score to >/= 61 to achieve predicted improvements in functional mobility due to skilled physical therapy interventions to increase safety with and  participation in daily activities.   Baseline: 49 on Initial Eval Goal status: INITIAL  ASSESSMENT:  CLINICAL IMPRESSION: Patient seen today for a re-certification of care given recurrence of L posterior canalithiasis. Patient symptoms improve drastically after first repetition of L Epley maneuver and full resolution noted after second rep. Patient encouraged to come back for follow up visit to fully screen other canals and ensure no recurrence. Patient will benefit from skilled physical therapy services to progress to PLOF.   OBJECTIVE IMPAIRMENTS: dizziness.   ACTIVITY LIMITATIONS: bending, bed mobility, locomotion level, and caring for others  PARTICIPATION LIMITATIONS: meal prep, interpersonal relationship, driving, shopping, community activity, occupation, and yard work  PERSONAL FACTORS: Age and 1-2 comorbidities: see above  are also affecting patient's functional outcome.   REHAB POTENTIAL: Good  CLINICAL DECISION MAKING: Stable/uncomplicated  EVALUATION COMPLEXITY: Low   PLAN:  PT FREQUENCY: 2x/week  PT DURATION: 4 weeks  PLANNED INTERVENTIONS: Therapeutic exercises, Therapeutic activity, Neuromuscular re-education, Balance training, Gait training, Patient/Family education, Self Care, Joint mobilization, Stair training, Vestibular training, Canalith repositioning, Visual/preceptual remediation/compensation, Orthotic/Fit training, DME instructions, Aquatic Therapy, Manual therapy, and Re-evaluation  PLAN FOR NEXT SESSION: re-check L dix hallpike and tx as indicated    Carmelia Bake, PT, DPT 02/19/2023, 10:48 AM

## 2023-02-23 ENCOUNTER — Ambulatory Visit: Payer: Medicare Other | Admitting: Physical Therapy

## 2023-02-23 ENCOUNTER — Encounter: Payer: Self-pay | Admitting: Physical Therapy

## 2023-02-23 VITALS — BP 140/67 | HR 61

## 2023-02-23 DIAGNOSIS — R2681 Unsteadiness on feet: Secondary | ICD-10-CM

## 2023-02-23 DIAGNOSIS — N400 Enlarged prostate without lower urinary tract symptoms: Secondary | ICD-10-CM | POA: Diagnosis not present

## 2023-02-23 DIAGNOSIS — R42 Dizziness and giddiness: Secondary | ICD-10-CM | POA: Diagnosis not present

## 2023-02-23 NOTE — Therapy (Addendum)
OUTPATIENT PHYSICAL THERAPY VESTIBULAR TREATMENT / DISCHARGE  Patient Name: Larry [name] MRN: [id number] DOB:Feb 12, 1951, 72 y.o., male Today's Date: 02/23/2023  END OF SESSION:  PT End of Session - 02/23/23 1534     Visit Number 2    Number of Visits 9    Date for PT Re-Evaluation 04/02/23    Authorization Type BCBS medicare    PT Start Time 1531    PT Stop Time 1602    PT Time Calculation (min) 31 min    Equipment Utilized During Treatment Other (comment)   not indicated performed in supine level   Activity Tolerance Patient tolerated treatment well    Behavior During Therapy WFL for tasks assessed/performed             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 ago, just gets jittery   Hypothyroidism    Lumbar stenosis    MRSA (methicillin resistant staph aureus) culture positive 09/10/2019   upper arm   OSA (obstructive sleep apnea)    "haven't wore mask in years; had OR; still snore" (04/12/2013)   Urinary frequency    Past Surgical History:  Procedure Laterality Date   BACK SURGERY     CARPOMETACARPEL SUSPENSION PLASTY Left 07/19/2020   Procedure: LEFT THUMB CARPOMETACARPAL (CMC) ARTHROPLASTY;  Surgeon: Tarry Kos, MD;  Location: [facility];  Service: Orthopedics;  Laterality: Left;   CERVICAL FUSION  2002; 2012   CYSTECTOMY     INGUINAL HERNIA REPAIR Bilateral 1997   KNEE ARTHROSCOPY Left ~ 1997; ~ 2005   LUMBAR FUSION  2007; 04/11/2013    MICROLARYNGOSCOPY N/A 03/07/2021   Procedure: DIRECT MICROLARYNGOSCOPY WITH BIOPSY;  Surgeon: Drema Halon, MD;  Location: [facility];  Service: ENT;  Laterality: N/A;   PENILE DEBRIDEMENT  ~ 2011   X 3  FOR MRSA INFECTION     SHOULDER ARTHROSCOPY Left ~ 2003   TONSILLECTOMY  1990's   TOTAL KNEE ARTHROPLASTY Right 07/23/2015   TOTAL KNEE ARTHROPLASTY Right 07/23/2015   Procedure: TOTAL KNEE ARTHROPLASTY;  Surgeon: Valeria Batman, MD;  Location: [facility];  Service: Orthopedics;  Laterality: Right;   TOTAL KNEE ARTHROPLASTY Left 10/03/2019   Procedure: LEFT TOTAL KNEE ARTHROPLASTY;  Surgeon: Valeria Batman, MD;  Location: [facility];  Service: Orthopedics;  Laterality: Left;   UVULOPALATOPHARYNGOPLASTY (UPPP)/TONSILLECTOMY/SEPTOPLASTY  1990's   VASECTOMY     Patient Active Problem List   Diagnosis Date Noted   Polycythemia, secondary 09/01/2022   Hereditary hemochromatosis (HCC) 09/01/2022   Left carpal tunnel syndrome 07/07/2022   Acute thigh pain, right 05/01/2021   CMC (carpometacarpal joint) dislocation, left, initial encounter 11/12/2020   Post-operative state 08/20/2020   Nontraumatic incomplete tear of left rotator cuff 08/20/2020   AC (acromioclavicular) arthritis 08/20/2020   Complete tear of left rotator cuff 07/09/2020   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 05/07/2020   Osteoarthritis of left AC (acromioclavicular) joint 05/07/2020   Impingement syndrome of left shoulder 02/08/2020   S/P TKR (total knee replacement) using cement, left 10/03/2019   Cellulitis of arm 08/28/2019   Essential hypertension 02/16/2018   Sepsis due to skin infection (HCC) 02/16/2018   Chronic pain 02/16/2018   Cellulitis of left lower extremity    Lumbar radiculopathy, chronic 01/24/2018   Lumbar stenosis with neurogenic claudication 03/23/2017   Hypothyroidism 07/25/2015   Primary osteoarthritis of right knee 07/23/2015   Primary osteoarthritis  of knee 07/23/2015    PCP: Georgianne Fick, MD REFERRING PROVIDER: Vivianne Master, PA  REFERRING DIAG: R42 (ICD-10-CM) - Dizziness and giddiness  THERAPY DIAG:  Unsteadiness on feet  Dizziness and giddiness  ONSET DATE: 01/12/2023 referral  Rationale for Evaluation and Treatment: Rehabilitation  SUBJECTIVE:   SUBJECTIVE STATEMENT: Patient reports after last visit he felt rough for about 3 days but has been felt since then. He hasn't any dizzy feeling when going to lay back or get out of bed in the last few days. Denies falls/near falls.   Pt accompanied by: self  PERTINENT HISTORY: Arthritis  Glaucoma  Hearing loss  Hypothyroid  OSA (obstructive sleep apnea) Multiple C-spine sxs (fusion) 5x back sx  PAIN:  Are you having pain? No  PRECAUTIONS: Fall  PATIENT GOALS: "to figure out if my dizziness has come back."  OBJECTIVE:  GENERAL OBSERVATION: ambulates without AD, no major signs of instability   VESTIBULAR TREATMENT:                                                                                                    Rechecked positional testing; full resolution of symptoms noted. Patient has no residual balance/dizzy complaints. Positional testing modified with bed in trendelenburg to optimize cervical extension.    Right Dix-Hallpike: no nystagmus Left Dix-Hallpike: no nystagmus Right Roll Test: no nystagmus Left Roll Test: no nystagmus   FOTO Score: 70  PATIENT EDUCATION: Education details: symptoms resolved and no further indication for PT at this time Person educated: Patient Education method: Explanation Education comprehension: verbalized understanding  HOME EXERCISE PROGRAM:  GOALS:Bending / Picking Up Objects    Sitting, slowly bend head down and pick up object on the floor. Return to upright position. Hold position until symptoms subside. Repeat ___4_ times per session per side. Do _2-3___ sessions per day.  Sit to  Side-Lying    Sit on edge of bed. 1. Turn head 45 to right. 2. Maintain head position and lie down slowly on left side. Hold until symptoms subside. 3. Sit up slowly. Hold until symptoms subside. 4. Turn head 45 to left. 5. Maintain head position and lie down slowly on right side. Hold until symptoms subside. 6. Sit up slowly.  Goals reviewed with patient? Yes  SHORT TERM GOALS: = LTG based on PT POC length  LONG TERM GOALS: Target date: 02/17/23    3.  LONG TERM GOALS: Target date: 03/19/2023 (With new Re-cert)  1.  Patient will demonstrate (-) positional  testing to indicate resolution of BPPV  Baseline: (+) Lt posterior canal canalithiasis; (-) positional testing for all canals Goal status: MET  2. Patient will improve FOTO score to >/= 61 to achieve predicted improvements in functional mobility due to skilled physical therapy interventions to increase safety with and participation in daily activities.   Baseline: 49 on Initial Eval; improved to 70 Goal status:  MET ASSESSMENT:  CLINICAL IMPRESSION: Patient arrives to session with no residual deficits of dizziness. Rechecked all positional testing and patient clear in all canals including L posterior canalithiasis noted last session. Patient agreeable to discharge based on no residual findings.   OBJECTIVE IMPAIRMENTS: dizziness.   ACTIVITY LIMITATIONS: bending, bed mobility, locomotion level, and caring for others  PARTICIPATION LIMITATIONS: meal prep, interpersonal relationship, driving, shopping, community activity, occupation, and yard work  PERSONAL FACTORS: Age and 1-2 comorbidities: see above  are also affecting patient's functional outcome.   REHAB POTENTIAL: Good  CLINICAL DECISION MAKING: Stable/uncomplicated  EVALUATION COMPLEXITY: Low   PLAN:  PT FREQUENCY: 2x/week  PT DURATION: 4 weeks  PLANNED INTERVENTIONS: Therapeutic exercises, Therapeutic activity, Neuromuscular re-education, Balance training,  Gait training, Patient/Family education, Self Care, Joint mobilization, Stair training, Vestibular training, Canalith repositioning, Visual/preceptual remediation/compensation, Orthotic/Fit training, DME instructions, Aquatic Therapy, Manual therapy, and Re-evaluation  PLAN FOR NEXT SESSION: not indicated - Patient discharged  Carmelia Bake, PT, DPT 02/23/2023, 4:10 PM   PHYSICAL THERAPY DISCHARGE SUMMARY  Visits from Start of Care: 2  Current functional level related to goals / functional outcomes: No residual dizziness   Remaining deficits: None noted at this time   Education / Equipment: Return with new referral if notice return of dizziness   Patient agrees to discharge. Patient goals were met. Patient is being discharged due to meeting the stated rehab goals.

## 2023-02-26 ENCOUNTER — Telehealth: Payer: Self-pay | Admitting: Physical Therapy

## 2023-02-26 DIAGNOSIS — N401 Enlarged prostate with lower urinary tract symptoms: Secondary | ICD-10-CM | POA: Diagnosis not present

## 2023-02-26 DIAGNOSIS — R350 Frequency of micturition: Secondary | ICD-10-CM | POA: Diagnosis not present

## 2023-02-26 NOTE — Telephone Encounter (Signed)
Called patient and LVM regarding the fact that this therapist would be out of town for a few days but would be happy to follow up when therapist was back in town. Explained that as patient was discharged. He would need a new referral.  Maryruth Eve, PT, DPT

## 2023-03-10 ENCOUNTER — Encounter: Payer: Self-pay | Admitting: Orthopaedic Surgery

## 2023-03-10 NOTE — Telephone Encounter (Signed)
Give him the option to wait to see Dr. Denese Killings or get referred to another office

## 2023-03-11 ENCOUNTER — Other Ambulatory Visit: Payer: Self-pay

## 2023-03-11 DIAGNOSIS — M79641 Pain in right hand: Secondary | ICD-10-CM

## 2023-03-16 DIAGNOSIS — E291 Testicular hypofunction: Secondary | ICD-10-CM | POA: Diagnosis not present

## 2023-03-16 DIAGNOSIS — E039 Hypothyroidism, unspecified: Secondary | ICD-10-CM | POA: Diagnosis not present

## 2023-03-24 DIAGNOSIS — G8929 Other chronic pain: Secondary | ICD-10-CM | POA: Diagnosis not present

## 2023-03-24 DIAGNOSIS — G959 Disease of spinal cord, unspecified: Secondary | ICD-10-CM | POA: Diagnosis not present

## 2023-03-24 DIAGNOSIS — Z6830 Body mass index (BMI) 30.0-30.9, adult: Secondary | ICD-10-CM | POA: Diagnosis not present

## 2023-03-24 DIAGNOSIS — M5412 Radiculopathy, cervical region: Secondary | ICD-10-CM | POA: Diagnosis not present

## 2023-03-26 ENCOUNTER — Other Ambulatory Visit: Payer: Self-pay | Admitting: Neurological Surgery

## 2023-03-26 DIAGNOSIS — G959 Disease of spinal cord, unspecified: Secondary | ICD-10-CM

## 2023-03-28 ENCOUNTER — Emergency Department (HOSPITAL_COMMUNITY): Payer: Medicare Other

## 2023-03-28 ENCOUNTER — Emergency Department (HOSPITAL_COMMUNITY)
Admission: EM | Admit: 2023-03-28 | Discharge: 2023-03-28 | Disposition: A | Discharge status: Home / Self Care | Payer: Medicare Other | Attending: Emergency Medicine | Admitting: Emergency Medicine

## 2023-03-28 ENCOUNTER — Other Ambulatory Visit: Payer: Self-pay

## 2023-03-28 DIAGNOSIS — Z79899 Other long term (current) drug therapy: Secondary | ICD-10-CM | POA: Diagnosis not present

## 2023-03-28 DIAGNOSIS — E871 Hypo-osmolality and hyponatremia: Secondary | ICD-10-CM | POA: Insufficient documentation

## 2023-03-28 DIAGNOSIS — Z96652 Presence of left artificial knee joint: Secondary | ICD-10-CM | POA: Diagnosis not present

## 2023-03-28 DIAGNOSIS — J45909 Unspecified asthma, uncomplicated: Secondary | ICD-10-CM | POA: Diagnosis not present

## 2023-03-28 DIAGNOSIS — R410 Disorientation, unspecified: Secondary | ICD-10-CM | POA: Diagnosis not present

## 2023-03-28 DIAGNOSIS — E039 Hypothyroidism, unspecified: Secondary | ICD-10-CM | POA: Insufficient documentation

## 2023-03-28 DIAGNOSIS — I959 Hypotension, unspecified: Secondary | ICD-10-CM | POA: Diagnosis not present

## 2023-03-28 DIAGNOSIS — R4182 Altered mental status, unspecified: Secondary | ICD-10-CM | POA: Diagnosis not present

## 2023-03-28 DIAGNOSIS — R509 Fever, unspecified: Secondary | ICD-10-CM | POA: Diagnosis not present

## 2023-03-28 DIAGNOSIS — A419 Sepsis, unspecified organism: Secondary | ICD-10-CM | POA: Diagnosis not present

## 2023-03-28 DIAGNOSIS — K5730 Diverticulosis of large intestine without perforation or abscess without bleeding: Secondary | ICD-10-CM | POA: Diagnosis not present

## 2023-03-28 DIAGNOSIS — R791 Abnormal coagulation profile: Secondary | ICD-10-CM | POA: Diagnosis not present

## 2023-03-28 DIAGNOSIS — R5381 Other malaise: Secondary | ICD-10-CM | POA: Insufficient documentation

## 2023-03-28 DIAGNOSIS — R231 Pallor: Secondary | ICD-10-CM | POA: Diagnosis not present

## 2023-03-28 DIAGNOSIS — N390 Urinary tract infection, site not specified: Secondary | ICD-10-CM | POA: Insufficient documentation

## 2023-03-28 DIAGNOSIS — I10 Essential (primary) hypertension: Secondary | ICD-10-CM | POA: Insufficient documentation

## 2023-03-28 DIAGNOSIS — K8020 Calculus of gallbladder without cholecystitis without obstruction: Secondary | ICD-10-CM | POA: Diagnosis not present

## 2023-03-28 DIAGNOSIS — K760 Fatty (change of) liver, not elsewhere classified: Secondary | ICD-10-CM | POA: Diagnosis not present

## 2023-03-28 DIAGNOSIS — R109 Unspecified abdominal pain: Secondary | ICD-10-CM | POA: Diagnosis not present

## 2023-03-28 DIAGNOSIS — R000 Tachycardia, unspecified: Secondary | ICD-10-CM | POA: Diagnosis not present

## 2023-03-28 LAB — CBC WITH DIFFERENTIAL/PLATELET
Abs Immature Granulocytes: 0.34 K/uL — ABNORMAL HIGH (ref 0.00–0.07)
Basophils Absolute: 0 K/uL (ref 0.0–0.1)
Basophils Relative: 0 %
Eosinophils Absolute: 0 K/uL (ref 0.0–0.5)
Eosinophils Relative: 0 %
HCT: 46.5 % (ref 39.0–52.0)
Hemoglobin: 15.9 g/dL (ref 13.0–17.0)
Immature Granulocytes: 2 %
Lymphocytes Relative: 4 %
Lymphs Abs: 0.6 K/uL — ABNORMAL LOW (ref 0.7–4.0)
MCH: 32 pg (ref 26.0–34.0)
MCHC: 34.2 g/dL (ref 30.0–36.0)
MCV: 93.6 fL (ref 80.0–100.0)
Monocytes Absolute: 1 K/uL (ref 0.1–1.0)
Monocytes Relative: 6 %
Neutro Abs: 15.4 K/uL — ABNORMAL HIGH (ref 1.7–7.7)
Neutrophils Relative %: 88 %
Platelets: 132 K/uL — ABNORMAL LOW (ref 150–400)
RBC: 4.97 MIL/uL (ref 4.22–5.81)
RDW: 13.3 % (ref 11.5–15.5)
WBC: 17.4 K/uL — ABNORMAL HIGH (ref 4.0–10.5)
nRBC: 0 % (ref 0.0–0.2)

## 2023-03-28 LAB — PROTIME-INR
INR: 1.4 — ABNORMAL HIGH (ref 0.8–1.2)
Prothrombin Time: 17.1 s — ABNORMAL HIGH (ref 11.4–15.2)

## 2023-03-28 LAB — URINALYSIS, W/ REFLEX TO CULTURE (INFECTION SUSPECTED)
Bilirubin Urine: NEGATIVE
Glucose, UA: NEGATIVE mg/dL
Ketones, ur: NEGATIVE mg/dL
Nitrite: NEGATIVE
Protein, ur: 100 mg/dL — AB
RBC / HPF: >50 RBC/hpf (ref 0–5)
Specific Gravity, Urine: 1.023 (ref 1.005–1.030)
WBC, UA: >50 WBC/hpf (ref 0–5)
pH: 5 (ref 5.0–8.0)

## 2023-03-28 LAB — I-STAT CHEM 8, ED
BUN: 20 mg/dL (ref 8–23)
Calcium, Ion: 1.16 mmol/L (ref 1.15–1.40)
Chloride: 100 mmol/L (ref 98–111)
Creatinine, Ser: 1.4 mg/dL — ABNORMAL HIGH (ref 0.61–1.24)
Glucose, Bld: 152 mg/dL — ABNORMAL HIGH (ref 70–99)
HCT: 49 % (ref 39.0–52.0)
Hemoglobin: 16.7 g/dL (ref 13.0–17.0)
Potassium: 3.9 mmol/L (ref 3.5–5.1)
Sodium: 135 mmol/L (ref 135–145)
TCO2: 24 mmol/L (ref 22–32)

## 2023-03-28 LAB — URINE CULTURE

## 2023-03-28 LAB — LACTIC ACID, PLASMA
Lactic Acid, Venous: 1.4 mmol/L (ref 0.5–1.9)
Lactic Acid, Venous: 1.2 mmol/L (ref 0.5–1.9)

## 2023-03-28 LAB — COMPREHENSIVE METABOLIC PANEL WITH GFR
ALT: 22 U/L (ref 0–44)
AST: 29 U/L (ref 15–41)
Albumin: 3.5 g/dL (ref 3.5–5.0)
Alkaline Phosphatase: 56 U/L (ref 38–126)
Anion gap: 12 (ref 5–15)
BUN: 18 mg/dL (ref 8–23)
CO2: 19 mmol/L — ABNORMAL LOW (ref 22–32)
Calcium: 8.5 mg/dL — ABNORMAL LOW (ref 8.9–10.3)
Chloride: 100 mmol/L (ref 98–111)
Creatinine, Ser: 1.34 mg/dL — ABNORMAL HIGH (ref 0.61–1.24)
GFR, Estimated: 57 mL/min — ABNORMAL LOW
Glucose, Bld: 157 mg/dL — ABNORMAL HIGH (ref 70–99)
Potassium: 4.3 mmol/L (ref 3.5–5.1)
Sodium: 131 mmol/L — ABNORMAL LOW (ref 135–145)
Total Bilirubin: 2.2 mg/dL — ABNORMAL HIGH (ref 0.3–1.2)
Total Protein: 6.1 g/dL — ABNORMAL LOW (ref 6.5–8.1)

## 2023-03-28 LAB — APTT: aPTT: 32 seconds (ref 24–36)

## 2023-03-28 LAB — CULTURE, BLOOD (ROUTINE X 2)

## 2023-03-28 MED ORDER — SODIUM CHLORIDE 0.9 % IV BOLUS (SEPSIS)
1000.0000 mL | Freq: Once | INTRAVENOUS | Status: AC
  Administered 2023-03-28: 1000 mL via INTRAVENOUS

## 2023-03-28 MED ORDER — ACETAMINOPHEN 500 MG PO TABS
1000.0000 mg | ORAL_TABLET | Freq: Once | ORAL | Status: AC
  Administered 2023-03-28: 1000 mg via ORAL
  Filled 2023-03-28: qty 2

## 2023-03-28 MED ORDER — SODIUM CHLORIDE 0.9 % IV SOLN
1000.0000 mL | INTRAVENOUS | Status: DC
  Administered 2023-03-28: 1000 mL via INTRAVENOUS

## 2023-03-28 MED ORDER — CEPHALEXIN 500 MG PO CAPS: 500.0000 mg | ORAL_CAPSULE | Freq: Three times a day (TID) | ORAL | 0 refills | Status: AC

## 2023-03-28 MED ORDER — ONDANSETRON 4 MG PO TBDP: 4.0000 mg | ORAL_TABLET | Freq: Three times a day (TID) | ORAL | 0 refills | Status: AC | PRN

## 2023-03-28 MED ORDER — SODIUM CHLORIDE 0.9 % IV SOLN
1.0000 g | Freq: Once | INTRAVENOUS | Status: AC
  Administered 2023-03-28: 1 g via INTRAVENOUS
  Filled 2023-03-28: qty 10

## 2023-03-28 NOTE — Discharge Instructions (Addendum)
Thank you for allowing [place] to take care of you today.  We hope you begin feeling better soon.  To-Do: Please follow-up with your primary doctor. Please return to the Emergency Department or call 911 if you experience chest pain, shortness of breath, severe pain, inability to tolerate oral hydration, altered mental status, or have any reason to think that you need emergency medical care.  Thank you again.  Hope you feel better soon.  Department of Emergency Medicine [facility]

## 2023-03-28 NOTE — ED Provider Notes (Signed)
[place] EMERGENCY DEPARTMENT AT [facility] Provider Note  CSN: [id number] Arrival date & time: 03/28/23 0222  Chief Complaint(s) Fever and Urinary Tract Infection  HPI Larry [name] is a 72 y.o. male with a past medical history listed below who presents to the emergency department for 2 days of generalized malaise and 1 day of fever.  Patient reports that he has been in bed all day today which is unusual for him.  He has had urinary frequency and dysuria for the past 2 days.  No cough or congestion.  No headache or neck pain.  No nausea or vomiting.  No abdominal pain.  No diarrhea.  No other physical complaints.  The history is provided by the patient and the spouse.    Past Medical History 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 ago, just gets jittery   Hypothyroidism    Lumbar stenosis    MRSA (methicillin resistant staph aureus) culture positive 09/10/2019   upper arm   OSA (obstructive sleep apnea)    "haven't wore mask in years; had OR; still snore" (04/12/2013)   Urinary frequency    Patient Active Problem List   Diagnosis Date Noted   Polycythemia, secondary 09/01/2022   Hereditary hemochromatosis (HCC) 09/01/2022   Left carpal tunnel syndrome 07/07/2022   Acute thigh pain, right 05/01/2021   CMC (carpometacarpal joint) dislocation, left, initial encounter 11/12/2020   Post-operative state 08/20/2020   Nontraumatic incomplete tear of left rotator cuff  08/20/2020   AC (acromioclavicular) arthritis 08/20/2020   Complete tear of left rotator cuff 07/09/2020   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 05/07/2020   Osteoarthritis of left AC (acromioclavicular) joint 05/07/2020   Impingement syndrome of left shoulder 02/08/2020   S/P TKR (total knee replacement) using cement, left 10/03/2019   Cellulitis of arm 08/28/2019   Essential hypertension 02/16/2018   Sepsis due to skin infection (HCC) 02/16/2018   Chronic pain 02/16/2018   Cellulitis of left lower extremity    Lumbar radiculopathy, chronic 01/24/2018   Lumbar stenosis with neurogenic claudication 03/23/2017   Hypothyroidism 07/25/2015   Primary osteoarthritis of right knee 07/23/2015   Primary osteoarthritis of knee 07/23/2015   Home Medication(s) Prior to Admission medications   Medication Sig Start Date End Date Taking? Authorizing Provider  cephALEXin (KEFLEX) 500 MG capsule Take 1 capsule (500 mg total) by mouth 3 (three) times daily for 10 days. 03/28/23 04/07/23 Yes Puneet Masoner, Amadeo Garnet, MD  ondansetron (ZOFRAN-ODT) 4 MG disintegrating tablet Take 1 tablet (4 mg total) by mouth every 8 (eight) hours as needed for up to 3 days for nausea or vomiting. 03/28/23 03/31/23 Yes Breckin Savannah, Amadeo Garnet, MD  amoxicillin (AMOXIL) 500 MG capsule TAKE ALL 4 TABLETS 1 HOUR PRIOR TO DENTAL APPOINTMENT 05/12/22   Valeria Batman, MD  amoxicillin (AMOXIL) 500 MG tablet  Take 4 tablets by mouth 1 hour prior to dental procedure. 05/01/21   Valeria Batman, MD  fluticasone (FLONASE) 50 MCG/ACT nasal spray SPRAY 1 SPRAY INTO EACH NOSTRIL EVERY DAY for 90 days    [provider]  furosemide (LASIX) 40 MG tablet Take 40 mg by mouth daily as needed for fluid or edema.     [provider]  hydrochlorothiazide (HYDRODIURIL) 25 MG tablet Take 25 mg by mouth every morning.    [provider]  HYDROcodone-acetaminophen (NORCO/VICODIN) 5-325 MG tablet Take 1  tablet by mouth 3 (three) times daily as needed for moderate pain. To be taken after surgery 07/24/22   Cristie Hem, PA-C  ketorolac (TORADOL) 10 MG tablet Take 1 tablet (10 mg total) by mouth 2 (two) times daily as needed. 07/19/20   Tarry Kos, MD  levothyroxine (SYNTHROID) 200 MCG tablet Take 200 mcg by mouth daily before breakfast.     [provider]  LORazepam (ATIVAN) 0.5 MG tablet Take 0.5-1 mg by mouth at bedtime.  03/14/17   [provider]  losartan (COZAAR) 50 MG tablet Take 50 mg by mouth daily.  07/17/19   [provider]  meloxicam (MOBIC) 15 MG tablet Take 15 mg by mouth daily.  09/27/18   [provider]  Multiple Vitamin (MULTIVITAMIN WITH MINERALS) TABS tablet Take 1 tablet by mouth daily.    [provider]  omeprazole (PRILOSEC) 40 MG capsule TAKE 1 CAPSULE EVERY DAY BEFORE DINNER 04/14/21   Drema Halon, MD  omeprazole (PRILOSEC) 40 MG capsule Take 1 capsule (40 mg total) by mouth daily. 07/07/21   Drema Halon, MD  ondansetron (ZOFRAN) 4 MG tablet Take 1 tablet (4 mg total) by mouth every 8 (eight) hours as needed for nausea or vomiting. 07/24/22   Cristie Hem, PA-C  phenylephrine (SUDAFED PE) 10 MG TABS tablet Take 10 mg by mouth every 4 (four) hours as needed.    [provider]  predniSONE (STERAPRED UNI-PAK 21 TAB) 10 MG (21) TBPK tablet Take as directed 06/09/22   Tarry Kos, MD  predniSONE (STERAPRED UNI-PAK 21 TAB) 10 MG (21) TBPK tablet Take as directed 01/12/23   Tarry Kos, MD  Sildenafil Citrate (VIAGRA PO) Take 30-60 mg by mouth daily as needed (for erectile dysfunction). Purchased from [email or web address]--dose indicated on website as 30 mg    [provider]  tamsulosin (FLOMAX) 0.4 MG CAPS capsule Take 0.4 mg by mouth daily.    [provider]  testosterone enanthate (DELATESTRYL) 200 MG/ML injection Inject 200 mg into the muscle every 14 (fourteen) days. Fridays.  05/18/19   [provider]  verapamil (CALAN-SR) 180 MG CR tablet Take 180 mg by mouth daily. 12/31/16   [provider]  vitamin B-12 (CYANOCOBALAMIN) 100 MCG tablet Take 100 mcg by mouth daily.    [provider]  Allergies Gabapentin  Review of Systems Review of Systems As noted in HPI  Physical Exam Vital Signs  I have reviewed the triage vital signs BP 119/69 (BP Location: Right Arm)   Pulse 86   Temp (!) 100.6 F (38.1 C) (Oral)   Resp 17   SpO2 95%   Physical Exam Vitals reviewed.  Constitutional:      General: He is not in acute distress.    Appearance: He is well-developed. He is not diaphoretic.  HENT:     Head: Normocephalic and atraumatic.     Right Ear: External ear normal.     Left Ear: External ear normal.     Nose: Nose normal.     Mouth/Throat:     Mouth: Mucous membranes are moist.  Eyes:     General: No scleral icterus.    Conjunctiva/sclera: Conjunctivae normal.  Neck:     Trachea: Phonation normal.  Cardiovascular:     Rate and Rhythm: Normal rate and regular rhythm.  Pulmonary:     Effort: Pulmonary effort is normal. No respiratory distress.     Breath sounds: No stridor.  Abdominal:     General: There is no distension.     Tenderness: There is no abdominal tenderness.  Musculoskeletal:        General: Normal range of motion.     Cervical back: Normal range of motion.  Neurological:     Mental Status: He is alert and oriented to person, place, and time.  Psychiatric:        Behavior: Behavior normal.     ED Results and Treatments Labs (all labs ordered are listed, but only abnormal results are displayed) Labs Reviewed  COMPREHENSIVE METABOLIC PANEL - Abnormal; Notable for the following components:      Result Value   Sodium 131 (*)    CO2 19 (*)    Glucose, Bld 157 (*)     Creatinine, Ser 1.34 (*)    Calcium 8.5 (*)    Total Protein 6.1 (*)    Total Bilirubin 2.2 (*)    GFR, Estimated 57 (*)    All other components within normal limits  CBC WITH DIFFERENTIAL/PLATELET - Abnormal; Notable for the following components:   WBC 17.4 (*)    Platelets 132 (*)    Neutro Abs 15.4 (*)    Lymphs Abs 0.6 (*)    Abs Immature Granulocytes 0.34 (*)    All other components within normal limits  PROTIME-INR - Abnormal; Notable for the following components:   Prothrombin Time 17.1 (*)    INR 1.4 (*)    All other components within normal limits  URINALYSIS, W/ REFLEX TO CULTURE (INFECTION SUSPECTED) - Abnormal; Notable for the following components:   Color, Urine YELLOW (*)    APPearance TURBID (*)    Hgb urine dipstick MODERATE (*)    Protein, ur 100 (*)    Leukocytes,Ua MODERATE (*)    Bacteria, UA FEW (*)    All other components within normal limits  I-STAT CHEM 8, ED - Abnormal; Notable for the following components:   Creatinine, Ser 1.40 (*)    Glucose, Bld 152 (*)    All other components within normal limits  CULTURE, BLOOD (ROUTINE X 2)  CULTURE, BLOOD (ROUTINE X 2)  URINE CULTURE  LACTIC ACID, PLASMA  LACTIC ACID, PLASMA  APTT  EKG  EKG Interpretation Date/Time:  Sunday March 28 2023 02:29:48 EDT Ventricular Rate:  111 PR Interval:  168 QRS Duration:  96 QT Interval:  319 QTC Calculation: 434 R Axis:   -1  Text Interpretation: Sinus tachycardia Probable left atrial enlargement Confirmed by Drema Pry [phone]) on 03/28/2023 4:03:50 AM       Radiology CT Renal Stone Study  Result Date: 03/28/2023 CLINICAL DATA:  72 year old male with history of abdominal and flank pain. Suspected stone. EXAM: CT ABDOMEN AND PELVIS WITHOUT CONTRAST TECHNIQUE: Multidetector CT imaging of the abdomen and pelvis was performed following the standard  protocol without IV contrast. RADIATION DOSE REDUCTION: This exam was performed according to the departmental dose-optimization program which includes automated exposure control, adjustment of the mA and/or kV according to patient size and/or use of iterative reconstruction technique. COMPARISON:  CT of the abdomen and pelvis 03/09/2014. FINDINGS: Lower chest: Mild scarring in the lung bases bilaterally, likely sequela of prior infections. Atherosclerotic calcifications are noted in the descending thoracic aorta. Hepatobiliary: Diffuse low attenuation throughout the hepatic parenchyma, indicative of a background of hepatic steatosis. No definite suspicious cystic or solid hepatic lesions are confidently identified on today's noncontrast CT examination. Small calcifications lying dependently in the gallbladder, presumably multiple tiny calculi. Gallbladder is moderately distended. No pericholecystic fluid or surrounding inflammatory changes to indicate an acute cholecystitis at this time. Pancreas: No definite pancreatic mass or peripancreatic fluid collections or inflammatory changes are noted on today's noncontrast CT examination. Spleen: Unremarkable. Adrenals/Urinary Tract: There are no abnormal calcifications within the collecting system of either kidney, along the course of either ureter, or within the lumen of the urinary bladder. No hydroureteronephrosis or perinephric stranding to suggest urinary tract obstruction at this time. The unenhanced appearance of the kidneys is unremarkable bilaterally. Unenhanced appearance of the urinary bladder is unremarkable. Bilateral adrenal glands are normal in appearance. Stomach/Bowel: Unenhanced appearance of the stomach is normal. No pathologic dilatation of small bowel or colon. Colonic diverticuli are noted, most evident in the sigmoid colon, without surrounding inflammatory changes to indicate an acute diverticulitis at this time. Normal appendix. Vascular/Lymphatic:  Atherosclerosis throughout the abdominal aorta and pelvic vasculature. No lymphadenopathy noted in the abdomen or pelvis. Reproductive: Prostate gland is enlarged measuring 6.4 x 4.9 x 6.5 cm (axial image 85 of series 3 and sagittal image 115 of series 7). Seminal vesicles are unremarkable in appearance. Other: No significant volume of ascites. No pneumoperitoneum. Postoperative changes of bilateral inguinal herniorrhaphy are noted. Musculoskeletal: Orthopedic rod and screw fixation hardware are noted from L1-S1. There are no aggressive appearing lytic or blastic lesions noted in the visualized portions of the skeleton. IMPRESSION: 1. No acute findings are noted in the abdomen or pelvis to account for the patient's symptoms. 2. Colonic diverticulosis without evidence of acute diverticulitis at this time. 3. Cholelithiasis without evidence of acute cholecystitis. 4. Hepatic steatosis. 5. Aortic atherosclerosis. 6. Prostatomegaly. 7. Additional postoperative changes and incidental findings, as above. Electronically Signed   By: Trudie Reed M.D.   On: 03/28/2023 06:02   DG Chest Port 1 View  Result Date: 03/28/2023 CLINICAL DATA:  Sepsis, altered mental status EXAM: PORTABLE CHEST 1 VIEW COMPARISON:  08/28/2019 FINDINGS: Lungs are well expanded, symmetric, and clear. No pneumothorax or pleural effusion. Cardiac size within normal limits. Pulmonary vascularity is normal. Osseous structures are age-appropriate. No acute bone abnormality. IMPRESSION: No active disease. Electronically Signed   By: Helyn Numbers M.D.   On: 03/28/2023 03:18  Medications Ordered in ED Medications  sodium chloride 0.9 % bolus 1,000 mL (0 mLs Intravenous Stopped 03/28/23 0445)    Followed by  sodium chloride 0.9 % bolus 1,000 mL (0 mLs Intravenous Stopped 03/28/23 0514)    Followed by  0.9 %  sodium chloride infusion (1,000 mLs Intravenous New Bag/Given 03/28/23 0445)  acetaminophen (TYLENOL) tablet 1,000 mg (1,000 mg Oral  Given 03/28/23 0337)  cefTRIAXone (ROCEPHIN) 1 g in sodium chloride 0.9 % 100 mL IVPB (0 g Intravenous Stopped 03/28/23 0408)   Procedures Procedures  (including critical care time) Medical Decision Making / ED Course   Medical Decision Making Amount and/or Complexity of Data Reviewed Labs: ordered. Decision-making details documented in ED Course. Radiology: ordered and independent interpretation performed. ECG/medicine tests: ordered and independent interpretation performed. Decision-making details documented in ED Course.  Risk OTC drugs. Prescription drug management.    Patient presents for fever and lethargy. Suspecting infectious process.  Favoring urinary symptoms given the lack of other infectious symptoms.  Patient started on IV fluids.  Given oral Tylenol.  He was started on empiric antibiotics for presumed UTI  Infectious workup initiated. CBC with leukocytosis.  No anemia.  Mild thrombocytopenia but close to his baseline.  Doubt TTP. Metabolic panel with mild hyponatremia.  Mild hyperglycemia without DKA.  Mild renal insufficiency without AKI. Lactic acid normal.  Not consistent with severe sepsis UA is consistent with a urinary tract infection.  CT scan obtained to rule out possible infected stone and negative.  No other intra-abdominal inflammatory/infectious process. Chest x-ray without evidence of pneumonia, pneumothorax, pulmonary edema pleural effusions.  After IV hydration, patient's tachycardia improved.  He was able to tolerate p.o. intake.  Did not require supplemental oxygen.  Patient appears well enough to be discharged home and does not require admission to the hospital at this time.  Patient and family agreed.  Strict return precautions given    Final Clinical Impression(s) / ED Diagnoses Final diagnoses:  Acute lower UTI   The patient appears reasonably screened and/or stabilized for discharge and I doubt any other medical condition or other [facility]  requiring further screening, evaluation, or treatment in the ED at this time. I have discussed the findings, Dx and Tx plan with the patient/family who expressed understanding and agree(s) with the plan. Discharge instructions discussed at length. The patient/family was given strict return precautions who verbalized understanding of the instructions. No further questions at time of discharge.  Disposition: Discharge  Condition: Good  ED Discharge Orders          Ordered    ondansetron (ZOFRAN-ODT) 4 MG disintegrating tablet  Every 8 hours PRN        03/28/23 0653    cephALEXin (KEFLEX) 500 MG capsule  3 times daily        03/28/23 0981             Follow Up: Georgianne Fick, MD [address] [place] [address] [phone]  Call  to schedule an appointment for close follow up    This chart was dictated using voice recognition software.  Despite best efforts to proofread,  errors can occur which can change the documentation meaning.    Nira Conn, MD 03/28/23 0700

## 2023-03-28 NOTE — ED Triage Notes (Signed)
Bibgcems from home, pt tried to wake up to go to the bathroom and was extremely weak and acting different per wife. Altered upon ems arrival, wife also stated he has had an elevated temp at 99.3 for the past day. Pt also has been having uti symptoms with no hx of utis. Ems stated initial bp 100/60 Bp- 122/65 Hr- 116 93% ra 97 % 4L 24 C02 26rr Cbg -142

## 2023-03-29 LAB — CULTURE, BLOOD (ROUTINE X 2): Culture: NO GROWTH

## 2023-03-30 ENCOUNTER — Encounter: Payer: Self-pay | Admitting: Hematology and Oncology

## 2023-03-30 ENCOUNTER — Encounter: Payer: Self-pay | Admitting: Neurological Surgery

## 2023-03-30 DIAGNOSIS — Z09 Encounter for follow-up examination after completed treatment for conditions other than malignant neoplasm: Secondary | ICD-10-CM | POA: Diagnosis not present

## 2023-03-30 DIAGNOSIS — Z87440 Personal history of urinary (tract) infections: Secondary | ICD-10-CM | POA: Diagnosis not present

## 2023-03-30 DIAGNOSIS — R630 Anorexia: Secondary | ICD-10-CM | POA: Diagnosis not present

## 2023-03-30 DIAGNOSIS — R5383 Other fatigue: Secondary | ICD-10-CM | POA: Diagnosis not present

## 2023-03-30 LAB — URINE CULTURE

## 2023-03-30 LAB — CULTURE, BLOOD (ROUTINE X 2): Culture: NO GROWTH

## 2023-03-31 ENCOUNTER — Telehealth (HOSPITAL_BASED_OUTPATIENT_CLINIC_OR_DEPARTMENT_OTHER): Payer: Self-pay

## 2023-03-31 ENCOUNTER — Ambulatory Visit
Admission: RE | Admit: 2023-03-31 | Discharge: 2023-03-31 | Disposition: A | Discharge status: Home / Self Care | Payer: Medicare Other | Source: Ambulatory Visit | Attending: Neurological Surgery | Admitting: Neurological Surgery

## 2023-03-31 DIAGNOSIS — G959 Disease of spinal cord, unspecified: Secondary | ICD-10-CM

## 2023-03-31 DIAGNOSIS — Z981 Arthrodesis status: Secondary | ICD-10-CM | POA: Diagnosis not present

## 2023-03-31 DIAGNOSIS — M47812 Spondylosis without myelopathy or radiculopathy, cervical region: Secondary | ICD-10-CM | POA: Diagnosis not present

## 2023-03-31 DIAGNOSIS — M4802 Spinal stenosis, cervical region: Secondary | ICD-10-CM | POA: Diagnosis not present

## 2023-03-31 LAB — CULTURE, BLOOD (ROUTINE X 2)

## 2023-03-31 NOTE — Telephone Encounter (Signed)
Post ED Visit - Positive Culture Follow-up  Culture report reviewed by antimicrobial stewardship pharmacist: [facility] []  Enzo Bi, Pharm.D. []  Celedonio Miyamoto, Pharm.D., BCPS AQ-ID []  Garvin Fila, Pharm.D., BCPS []  Georgina Pillion, Pharm.D., BCPS []  [place], [address].D., BCPS, AAHIVP []  Estella Husk, Pharm.D., BCPS, AAHIVP [x]  Lysle Pearl, PharmD, BCPS []  Phillips Climes, PharmD, BCPS []  Agapito Games, PharmD, BCPS []  Verlan Friends, PharmD []  Mervyn Gay, PharmD, BCPS []  Vinnie Level, PharmD  [facility] []  Len Childs, PharmD []  Greer Pickerel, PharmD []  Adalberto Cole, PharmD []  Perlie Gold, Rph []  Lonell Face) Jean Rosenthal, PharmD []  Earl Many, PharmD []  Junita Push, PharmD []  Dorna Leitz, PharmD []  Terrilee Files, PharmD []  Lynann Beaver, PharmD []  Keturah Barre, PharmD []  Loralee Pacas, PharmD []  Bernadene Person, PharmD   Positive urine culture Treated with Cephalexin, organism sensitive to the same and no further patient follow-up is required at this time.  Arvid Right 03/31/2023, 10:26 AM

## 2023-04-02 LAB — CULTURE, BLOOD (ROUTINE X 2)

## 2023-04-14 ENCOUNTER — Other Ambulatory Visit: Payer: Medicare Other

## 2023-04-15 DIAGNOSIS — M4714 Other spondylosis with myelopathy, thoracic region: Secondary | ICD-10-CM | POA: Diagnosis not present

## 2023-04-15 DIAGNOSIS — Z6829 Body mass index (BMI) 29.0-29.9, adult: Secondary | ICD-10-CM | POA: Diagnosis not present

## 2023-04-15 DIAGNOSIS — M5412 Radiculopathy, cervical region: Secondary | ICD-10-CM | POA: Diagnosis not present

## 2023-04-20 ENCOUNTER — Other Ambulatory Visit: Payer: Self-pay | Admitting: Neurological Surgery

## 2023-04-20 DIAGNOSIS — M4714 Other spondylosis with myelopathy, thoracic region: Secondary | ICD-10-CM

## 2023-04-20 DIAGNOSIS — M7918 Myalgia, other site: Secondary | ICD-10-CM | POA: Diagnosis not present

## 2023-04-21 ENCOUNTER — Encounter: Payer: Self-pay | Admitting: Neurological Surgery

## 2023-04-27 DIAGNOSIS — M5412 Radiculopathy, cervical region: Secondary | ICD-10-CM | POA: Diagnosis not present

## 2023-04-28 ENCOUNTER — Ambulatory Visit (INDEPENDENT_AMBULATORY_CARE_PROVIDER_SITE_OTHER): Payer: Medicare Other | Admitting: Orthopedic Surgery

## 2023-04-28 DIAGNOSIS — M19041 Primary osteoarthritis, right hand: Secondary | ICD-10-CM | POA: Diagnosis not present

## 2023-04-28 NOTE — Progress Notes (Signed)
Larry [name] - 72 y.o. male MRN [id number]  Date of birth: 11/14/1950  Office Visit Note: Visit Date: 04/28/2023 PCP: Georgianne Fick, MD Referred by: Tarry Kos, MD  Subjective: Chief Complaint  Patient presents with   Right Hand - Pain   HPI: Larry [name] is a pleasant 72 y.o. male who presents today for right index MCP pain and swelling.  Pain has been present for multiple years, swelling is intermittent.  Has reduced weight approximately 20 pounds over the past few weeks which is helping his hand swelling and pain bilaterally.  History of prior left CMC arthroplasty.  No prior right hand surgical intervention.  Visit Reason: Right hand/Index finger Hand dominance: right Occupation: Retired Diabetic: No  Prior Testing:xray 01/12/23 Injections: None Treatments: prednisone Prior Surgery: None  *Index finger swelling, hurts if he "bangs" it on table or anything*  Pertinent ROS were reviewed with the patient and found to be negative unless otherwise specified above in HPI.   Assessment & Plan: Visit Diagnoses: No diagnosis found.  Plan: Discussion was had with the patient today regarding his right index finger MCP arthritis.  We discussed treatment modalities ranging from conservative to surgical.  From a conservative standpoint, we discussed topical ointments, over-the-counter medications exercises.  From a surgical standpoint, we discussed potential for MCP arthroplasty versus arthrodesis.  Given that his symptoms are relatively manageable, he elected to continue with conservative management for the time being which I am in agreement with.  He is welcome to symptoms worsen for repeat discussion regarding potential MCP arthroplasty.  Follow-up: No follow-ups on file.   Meds & Orders: No orders of the defined types were placed in this encounter.  No orders of the defined types were placed in this encounter.    Procedures: No procedures performed       Clinical History: No specialty comments available.  He reports that he quit smoking about 36 years ago. His smoking use included cigarettes. He started smoking about 51 years ago. He has a 7.5 pack-year smoking history. He has never used smokeless tobacco. No results for input(s): "HGBA1C", "LABURIC" in the last 8760 hours.  Objective:   Vital Signs: There were no vitals taken for this visit.  Physical Exam  Gen: Well-appearing, in no acute distress; non-toxic CV: Regular Rate. Well-perfused. Warm.  Resp: Breathing unlabored on room air; no wheezing. Psych: Fluid speech in conversation; appropriate affect; normal thought process Neuro: Sensation intact throughout. No gross coordination deficits.   Ortho Exam  General: Patient is well appearing and in no distress.  Skin and Muscle: Prior left thumb CMC incision well-healed. Muscle bulk and contour normal, no signs of atrophy.     Range of Motion and Palpation Tests: Mobility is full about the elbows with flexion and extension.  Forearm supination and pronation are 85/85 bilaterally.  Wrist flexion/extension is 75/65 bilaterally.  Digital flexion and extension are full.  Thumb opposition is full to the base of the small fingers bilaterally.    There is moderate swelling noted to the right index MCP with associated tenderness, range of motion is well-preserved without instability  Neurologic, Vascular, Motor: Sensation is intact to light touch in the median/radial/ulnar distributions.  Fingers pink and well perfused.  Capillary refill is brisk.      No results found for: "HGBA1C"   Imaging: No results found.  Past Medical/Family/Surgical/Social History: Medications & Allergies reviewed per EMR, new medications updated. 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 05/07/2020   Osteoarthritis of left AC (acromioclavicular) joint 05/07/2020   Impingement syndrome of left shoulder 02/08/2020   S/P TKR (total knee replacement) using cement, left 10/03/2019   Cellulitis of arm 08/28/2019   Essential hypertension 02/16/2018   Sepsis due to skin infection (HCC) 02/16/2018   Chronic pain 02/16/2018   Cellulitis of left lower extremity    Lumbar radiculopathy, chronic 01/24/2018   Lumbar stenosis with neurogenic claudication 03/23/2017   Hypothyroidism 07/25/2015   Primary osteoarthritis of right knee 07/23/2015   Primary osteoarthritis of knee 07/23/2015   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 ago, just gets jittery   Hypothyroidism    Lumbar stenosis    MRSA (methicillin resistant staph aureus) culture positive 09/10/2019   upper arm   OSA (obstructive sleep apnea)    "haven't wore mask in years; had OR; still snore" (04/12/2013)   Urinary frequency    No family history on file. Past Surgical History:  Procedure Laterality Date   BACK SURGERY     CARPOMETACARPEL SUSPENSION PLASTY Left 07/19/2020   Procedure:  LEFT THUMB CARPOMETACARPAL (CMC) ARTHROPLASTY;  Surgeon: Tarry Kos, MD;  Location: [facility];  Service: Orthopedics;  Laterality: Left;   CERVICAL FUSION  2002; 2012   CYSTECTOMY     INGUINAL HERNIA REPAIR Bilateral 1997   KNEE ARTHROSCOPY Left ~ 1997; ~ 2005   LUMBAR FUSION  2007; 04/11/2013   MICROLARYNGOSCOPY N/A 03/07/2021   Procedure: DIRECT MICROLARYNGOSCOPY WITH BIOPSY;  Surgeon: Drema Halon, MD;  Location: [facility];  Service: ENT;  Laterality: N/A;   PENILE DEBRIDEMENT  ~ 2011   X 3  FOR MRSA INFECTION     SHOULDER ARTHROSCOPY Left ~ 2003   TONSILLECTOMY  1990's   TOTAL KNEE ARTHROPLASTY Right 07/23/2015   TOTAL KNEE ARTHROPLASTY Right 07/23/2015   Procedure: TOTAL KNEE ARTHROPLASTY;  Surgeon: Valeria Batman, MD;  Location: [facility];  Service: Orthopedics;  Laterality: Right;   TOTAL KNEE ARTHROPLASTY Left 10/03/2019   Procedure: LEFT TOTAL KNEE ARTHROPLASTY;  Surgeon: Valeria Batman, MD;  Location: [facility];  Service: Orthopedics;  Laterality: Left;   UVULOPALATOPHARYNGOPLASTY (UPPP)/TONSILLECTOMY/SEPTOPLASTY  1990's   VASECTOMY     Social History   Occupational History   Not on file  Tobacco Use   Smoking status: Former    Current packs/day: 0.00    Average packs/day:  0.5 packs/day for 15.0 years (7.5 ttl pk-yrs)    Types: Cigarettes    Start date: 10/15/1971    Quit date: 10/14/1986    Years since quitting: 36.5   Smokeless tobacco: Never  Vaping Use   Vaping status: Never Used  Substance and Sexual Activity   Alcohol use: Yes    Comment: occasional   Drug use: No   Sexual activity: Yes

## 2023-04-29 DIAGNOSIS — M5412 Radiculopathy, cervical region: Secondary | ICD-10-CM | POA: Diagnosis not present

## 2023-05-01 ENCOUNTER — Ambulatory Visit
Admission: RE | Admit: 2023-05-01 | Discharge: 2023-05-01 | Disposition: A | Discharge status: Home / Self Care | Payer: Medicare Other | Source: Ambulatory Visit | Attending: Neurological Surgery | Admitting: Neurological Surgery

## 2023-05-01 DIAGNOSIS — M4714 Other spondylosis with myelopathy, thoracic region: Secondary | ICD-10-CM

## 2023-05-04 DIAGNOSIS — M5412 Radiculopathy, cervical region: Secondary | ICD-10-CM | POA: Diagnosis not present

## 2023-05-06 DIAGNOSIS — M5412 Radiculopathy, cervical region: Secondary | ICD-10-CM | POA: Diagnosis not present

## 2023-05-07 DIAGNOSIS — M4714 Other spondylosis with myelopathy, thoracic region: Secondary | ICD-10-CM | POA: Diagnosis not present

## 2023-05-11 ENCOUNTER — Ambulatory Visit: Payer: Medicare Other | Admitting: Physical Therapy

## 2023-05-11 DIAGNOSIS — M5412 Radiculopathy, cervical region: Secondary | ICD-10-CM | POA: Diagnosis not present

## 2023-05-13 ENCOUNTER — Other Ambulatory Visit: Payer: Self-pay

## 2023-05-13 ENCOUNTER — Inpatient Hospital Stay: Payer: Medicare Other | Attending: Hematology and Oncology

## 2023-05-13 DIAGNOSIS — D751 Secondary polycythemia: Secondary | ICD-10-CM | POA: Insufficient documentation

## 2023-05-13 DIAGNOSIS — M5412 Radiculopathy, cervical region: Secondary | ICD-10-CM | POA: Diagnosis not present

## 2023-05-13 LAB — CBC WITH DIFFERENTIAL (CANCER CENTER ONLY)
Abs Immature Granulocytes: 0.02 10*3/uL (ref 0.00–0.07)
Basophils Absolute: 0 10*3/uL (ref 0.0–0.1)
Basophils Relative: 0 %
Eosinophils Absolute: 0.1 10*3/uL (ref 0.0–0.5)
Eosinophils Relative: 1 %
HCT: 46.5 % (ref 39.0–52.0)
Hemoglobin: 16 g/dL (ref 13.0–17.0)
Immature Granulocytes: 0 %
Lymphocytes Relative: 18 %
Lymphs Abs: 1.3 10*3/uL (ref 0.7–4.0)
MCH: 32.5 pg (ref 26.0–34.0)
MCHC: 34.4 g/dL (ref 30.0–36.0)
MCV: 94.5 fL (ref 80.0–100.0)
Monocytes Absolute: 0.6 10*3/uL (ref 0.1–1.0)
Monocytes Relative: 8 %
Neutro Abs: 5.3 10*3/uL (ref 1.7–7.7)
Neutrophils Relative %: 73 %
Platelet Count: 164 10*3/uL (ref 150–400)
RBC: 4.92 MIL/uL (ref 4.22–5.81)
RDW: 15 % (ref 11.5–15.5)
WBC Count: 7.3 10*3/uL (ref 4.0–10.5)
nRBC: 0 % (ref 0.0–0.2)

## 2023-05-13 LAB — IRON AND IRON BINDING CAPACITY (CC-WL,HP ONLY)
Iron: 125 ug/dL (ref 45–182)
Saturation Ratios: 31 % (ref 17.9–39.5)
TIBC: 405 ug/dL (ref 250–450)
UIBC: 280 ug/dL (ref 117–376)

## 2023-05-14 LAB — FERRITIN: Ferritin: 38 ng/mL (ref 24–336)

## 2023-05-17 ENCOUNTER — Inpatient Hospital Stay: Payer: Medicare Other | Admitting: Hematology and Oncology

## 2023-05-17 DIAGNOSIS — D751 Secondary polycythemia: Secondary | ICD-10-CM | POA: Diagnosis not present

## 2023-05-17 NOTE — Assessment & Plan Note (Signed)
Lab review 07/30/2022: Testosterone 492, hemoglobin 18.5,, hematocrit 54.3, platelets 169, creatinine 0.94, LFTs normal 09/01/2022: Hemoglobin 18.7, hematocrit 54.9, platelets 157, ferritin 132, creatinine 1.45, iron saturation 65% 09/01/2022: MPN panel: Normal (no JAK2 mutation) 09/01/2022: Hemochromatosis gene testing:c.187C>G (p.His63Asp) - Detected, heterozygous  11/18/2022: Hemoglobin 16.5, hematocrit 46, testosterone 624, ferritin 157, TSH 1.5, iron saturation 56% 02/08/2023: Hemoglobin 15.4, hematocrit 43.8, platelets 143, testosterone 791, iron saturation 31%, ferritin 55 05/13/2023: Hemoglobin 16, hematocrit 46.5, platelets 164, iron saturation 31%, ferritin 38   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   Developed Vertigo: got therapy   Hereditary hemochromatosis (HCC) Elevated hemochromatosis also a reason for his elevated hemoglobin levels.   Repeat labs in 3 months and follow-up after

## 2023-05-17 NOTE — Progress Notes (Signed)
HEMATOLOGY-ONCOLOGY TELEPHONE VISIT PROGRESS NOTE  I connected with our patient on 05/17/23 at  3:15 PM EDT by telephone and verified that I am speaking with the correct person using two identifiers.  I discussed the limitations, risks, security and privacy concerns of performing an evaluation and management service by telephone and the availability of in person appointments.  I also discussed with the patient that there may be a patient responsible charge related to this service. The patient expressed understanding and agreed to proceed.   History of Present Illness: F/U of hemochromatosis and sec polycythemia Eating better. UTI a month ago (syncope), lost 15lb  REVIEW OF SYSTEMS:   Constitutional: Denies fevers, chills or abnormal weight loss All other systems were reviewed with the patient and are negative. Observations/Objective:     Assessment Plan:  Polycythemia, secondary Lab review 07/30/2022: Testosterone 492, hemoglobin 18.5,, hematocrit 54.3, platelets 169, creatinine 0.94, LFTs normal 09/01/2022: Hemoglobin 18.7, hematocrit 54.9, platelets 157, ferritin 132, creatinine 1.45, iron saturation 65% 09/01/2022: MPN panel: Normal (no JAK2 mutation) 09/01/2022: Hemochromatosis gene testing:c.187C>G (p.His63Asp) - Detected, heterozygous  11/18/2022: Hemoglobin 16.5, hematocrit 46, testosterone 624, ferritin 157, TSH 1.5, iron saturation 56% 02/08/2023: Hemoglobin 15.4, hematocrit 43.8, platelets 143, testosterone 791, iron saturation 31%, ferritin 55 05/13/2023: Hemoglobin 16, hematocrit 46.5, platelets 164, iron saturation 31%, ferritin 38   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   Developed Vertigo: got therapy   Hereditary hemochromatosis (HCC) Elevated hemochromatosis also a reason for his elevated hemoglobin levels.   Repeat labs  in 3 months and follow-up after   I discussed the assessment and treatment plan with the patient. The patient was provided an opportunity to ask questions and all were answered. The patient agreed with the plan and demonstrated an understanding of the instructions. The patient was advised to call back or seek an in-person evaluation if the symptoms worsen or if the condition fails to improve as anticipated.   I provided 12 minutes of non-face-to-face time during this encounter.  This includes time for charting and coordination of care   Tamsen Meek, MD

## 2023-05-18 ENCOUNTER — Telehealth: Payer: Self-pay | Admitting: Hematology and Oncology

## 2023-05-18 NOTE — Telephone Encounter (Signed)
Scheduled appointment per 8/19 los. Patient is aware of the made appointments.

## 2023-05-20 DIAGNOSIS — M5412 Radiculopathy, cervical region: Secondary | ICD-10-CM | POA: Diagnosis not present

## 2023-05-25 DIAGNOSIS — M5412 Radiculopathy, cervical region: Secondary | ICD-10-CM | POA: Diagnosis not present

## 2023-05-26 DIAGNOSIS — I781 Nevus, non-neoplastic: Secondary | ICD-10-CM | POA: Diagnosis not present

## 2023-05-26 DIAGNOSIS — L565 Disseminated superficial actinic porokeratosis (DSAP): Secondary | ICD-10-CM | POA: Diagnosis not present

## 2023-05-26 DIAGNOSIS — X32XXXD Exposure to sunlight, subsequent encounter: Secondary | ICD-10-CM | POA: Diagnosis not present

## 2023-05-26 DIAGNOSIS — D485 Neoplasm of uncertain behavior of skin: Secondary | ICD-10-CM | POA: Diagnosis not present

## 2023-05-26 DIAGNOSIS — L570 Actinic keratosis: Secondary | ICD-10-CM | POA: Diagnosis not present

## 2023-05-27 DIAGNOSIS — M5412 Radiculopathy, cervical region: Secondary | ICD-10-CM | POA: Diagnosis not present

## 2023-06-01 DIAGNOSIS — M5412 Radiculopathy, cervical region: Secondary | ICD-10-CM | POA: Diagnosis not present

## 2023-06-03 DIAGNOSIS — M5412 Radiculopathy, cervical region: Secondary | ICD-10-CM | POA: Diagnosis not present

## 2023-06-04 DIAGNOSIS — Z23 Encounter for immunization: Secondary | ICD-10-CM | POA: Diagnosis not present

## 2023-06-08 DIAGNOSIS — Z6829 Body mass index (BMI) 29.0-29.9, adult: Secondary | ICD-10-CM | POA: Diagnosis not present

## 2023-06-08 DIAGNOSIS — M5412 Radiculopathy, cervical region: Secondary | ICD-10-CM | POA: Diagnosis not present

## 2023-06-08 DIAGNOSIS — M5416 Radiculopathy, lumbar region: Secondary | ICD-10-CM | POA: Diagnosis not present

## 2023-06-14 DIAGNOSIS — L820 Inflamed seborrheic keratosis: Secondary | ICD-10-CM | POA: Diagnosis not present

## 2023-06-15 DIAGNOSIS — K080 Exfoliation of teeth due to systemic causes: Secondary | ICD-10-CM | POA: Diagnosis not present

## 2023-06-17 ENCOUNTER — Encounter: Payer: Self-pay | Admitting: Orthopaedic Surgery

## 2023-07-21 DIAGNOSIS — M5416 Radiculopathy, lumbar region: Secondary | ICD-10-CM | POA: Diagnosis not present

## 2023-07-21 DIAGNOSIS — M791 Myalgia, unspecified site: Secondary | ICD-10-CM | POA: Diagnosis not present

## 2023-07-26 ENCOUNTER — Ambulatory Visit: Payer: Medicare Other | Admitting: Orthopedic Surgery

## 2023-07-26 DIAGNOSIS — M19041 Primary osteoarthritis, right hand: Secondary | ICD-10-CM | POA: Diagnosis not present

## 2023-07-26 NOTE — H&P (View-Only) (Signed)
Larry [name] - 72 y.o. male MRN [id number]  Date of birth: 1950/11/22  Office Visit Note: Visit Date: 07/26/2023 PCP: Georgianne Fick, MD Referred by: Georgianne Fick, MD  Subjective: No chief complaint on file.  HPI: Larry [name] is a pleasant 72 y.o. 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  As known, he does have a history of prior left CMC arthroplasty.  No prior right hand surgical intervention.  Visit Reason: Right hand/Index finger Hand dominance: right Occupation: Retired Diabetic: No  Prior Testing:xray 01/12/23 Injections: None Treatments: prednisone Prior Surgery: None   Pertinent ROS were reviewed with the patient and found to be negative unless otherwise specified above in HPI.   Assessment & Plan: Visit Diagnoses: No diagnosis found.  Plan: Discussion was had with the patient today regarding his right index finger and long finger MCP arthritis.  We discussed treatment modalities ranging from conservative to surgical.  From a conservative standpoint, we discussed topical ointments, over-the-counter medications exercises.  From a surgical standpoint, we discussed potential for MCP arthroplasty versus arthrodesis.  Given that his symptoms are worsening, he would like to move forward with MCP arthroplasty to the index and long finger.  Risks and benefits of the procedure were discussed, risks including but not limited to infection, bleeding, scarring, stiffness, nerve injury, tendon injury, vascular injury, hardware complication, recurrence of symptoms and need for subsequent operation.  Patient expressed understanding.       Follow-up: No follow-ups on file.   Meds & Orders: No orders of the defined types were placed in this encounter.  No orders of the defined types were placed in this encounter.    Procedures: No procedures performed      Clinical History: No specialty comments  available.  He reports that he quit smoking about 36 years ago. His smoking use included cigarettes. He started smoking about 51 years ago. He has a 7.5 pack-year smoking history. He has never used smokeless tobacco. No results for input(s): "HGBA1C", "LABURIC" in the last 8760 hours.  Objective:   Vital Signs: There were no vitals taken for this visit.  Physical Exam  Gen: Well-appearing, in no acute distress; non-toxic CV: Regular Rate. Well-perfused. Warm.  Resp: Breathing unlabored on room air; no wheezing. Psych: Fluid speech in conversation; appropriate affect; normal thought process Neuro: Sensation intact throughout. No gross coordination deficits.   Ortho Exam  General: Patient is well appearing and in no distress.  Skin and Muscle: Prior left thumb CMC incision well-healed. Muscle bulk and contour normal, no signs of atrophy.     Range of Motion and Palpation Tests: Mobility is full about the elbows with flexion and extension.  Forearm supination and pronation are 85/85 bilaterally.  Wrist flexion/extension is 75/65 bilaterally.  Digital flexion and extension are full.  Thumb opposition is full to the base of the small fingers bilaterally.    There is moderate swelling noted to the right index and long finger MCP with associated tenderness, range of motion is well-preserved without instability  Neurologic, Vascular, Motor: Sensation is intact to light touch in the median/radial/ulnar distributions.  Fingers pink and well perfused.  Capillary refill is brisk.      No results found for: "HGBA1C"   Imaging: No results found.  Past Medical/Family/Surgical/Social History: Medications & Allergies reviewed per EMR, new medications updated. 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 05/07/2020   Osteoarthritis of left AC (acromioclavicular) joint 05/07/2020   Impingement syndrome of left shoulder 02/08/2020   S/P TKR (total knee replacement) using cement, left 10/03/2019   Cellulitis of arm 08/28/2019   Essential hypertension 02/16/2018   Sepsis due to skin infection (HCC) 02/16/2018   Chronic pain 02/16/2018   Cellulitis of left lower extremity    Lumbar radiculopathy, chronic 01/24/2018   Lumbar stenosis with neurogenic claudication 03/23/2017   Hypothyroidism 07/25/2015   Primary osteoarthritis of right knee 07/23/2015   Primary osteoarthritis of knee 07/23/2015   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 ago, just gets jittery   Hypothyroidism    Lumbar stenosis    MRSA (methicillin resistant staph aureus) culture positive 09/10/2019   upper arm   OSA (obstructive sleep apnea)    "haven't wore mask in years; had OR; still snore" (04/12/2013)   Urinary frequency    No family history on file. Past Surgical History:  Procedure Laterality Date   BACK SURGERY     CARPOMETACARPEL SUSPENSION PLASTY Left 07/19/2020   Procedure: LEFT THUMB  CARPOMETACARPAL (CMC) ARTHROPLASTY;  Surgeon: Tarry Kos, MD;  Location: [facility];  Service: Orthopedics;  Laterality: Left;   CERVICAL FUSION  2002; 2012   CYSTECTOMY     INGUINAL HERNIA REPAIR Bilateral 1997   KNEE ARTHROSCOPY Left ~ 1997; ~ 2005   LUMBAR FUSION  2007; 04/11/2013   MICROLARYNGOSCOPY N/A 03/07/2021   Procedure: DIRECT MICROLARYNGOSCOPY WITH BIOPSY;  Surgeon: Drema Halon, MD;  Location: [facility];  Service: ENT;  Laterality: N/A;   PENILE DEBRIDEMENT  ~ 2011   X 3  FOR MRSA INFECTION     SHOULDER ARTHROSCOPY Left ~ 2003   TONSILLECTOMY  1990's   TOTAL KNEE ARTHROPLASTY Right 07/23/2015   TOTAL KNEE ARTHROPLASTY Right 07/23/2015   Procedure: TOTAL KNEE ARTHROPLASTY;  Surgeon: Valeria Batman, MD;  Location[facility];  Service: Orthopedics;  Laterality: Right;   TOTAL KNEE ARTHROPLASTY Left 10/03/2019   Procedure: LEFT TOTAL KNEE ARTHROPLASTY;  Surgeon: Valeria Batman, MD;  Location: [facility];  Service: Orthopedics;  Laterality: Left;   UVULOPALATOPHARYNGOPLASTY (UPPP)/TONSILLECTOMY/SEPTOPLASTY  1990's   VASECTOMY     Social History   Occupational History   Not on file  Tobacco Use   Smoking status: Former    Current packs/day: 0.00    Average packs/day: 0.5 packs/day for 15.0 years (7.5 ttl pk-yrs)  Types: Cigarettes    Start date: 10/15/1971    Quit date: 10/14/1986    Years since quitting: 36.8   Smokeless tobacco: Never  Vaping Use   Vaping status: Never Used  Substance and Sexual Activity   Alcohol use: Yes    Comment: occasional   Drug use: No   Sexual activity: Yes

## 2023-07-26 NOTE — Progress Notes (Signed)
Larry [name] - 72 y.o. male MRN [id number]  Date of birth: April 03, 1951  Office Visit Note: Visit Date: 07/26/2023 PCP: Georgianne Fick, MD Referred by: Georgianne Fick, MD  Subjective: No chief complaint on file.  HPI: Larry [name] is a pleasant 72 y.o. 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  As known, he does have a history of prior left CMC arthroplasty.  No prior right hand surgical intervention.  Visit Reason: Right hand/Index finger Hand dominance: right Occupation: Retired Diabetic: No  Prior Testing:xray 01/12/23 Injections: None Treatments: prednisone Prior Surgery: None   Pertinent ROS were reviewed with the patient and found to be negative unless otherwise specified above in HPI.   Assessment & Plan: Visit Diagnoses: No diagnosis found.  Plan: Discussion was had with the patient today regarding his right index finger and long finger MCP arthritis.  We discussed treatment modalities ranging from conservative to surgical.  From a conservative standpoint, we discussed topical ointments, over-the-counter medications exercises.  From a surgical standpoint, we discussed potential for MCP arthroplasty versus arthrodesis.  Given that his symptoms are worsening, he would like to move forward with MCP arthroplasty to the index and long finger.  Risks and benefits of the procedure were discussed, risks including but not limited to infection, bleeding, scarring, stiffness, nerve injury, tendon injury, vascular injury, hardware complication, recurrence of symptoms and need for subsequent operation.  Patient expressed understanding.       Follow-up: No follow-ups on file.   Meds & Orders: No orders of the defined types were placed in this encounter.  No orders of the defined types were placed in this encounter.    Procedures: No procedures performed      Clinical History: No specialty comments  available.  He reports that he quit smoking about 36 years ago. His smoking use included cigarettes. He started smoking about 51 years ago. He has a 7.5 pack-year smoking history. He has never used smokeless tobacco. No results for input(s): "HGBA1C", "LABURIC" in the last 8760 hours.  Objective:   Vital Signs: There were no vitals taken for this visit.  Physical Exam  Gen: Well-appearing, in no acute distress; non-toxic CV: Regular Rate. Well-perfused. Warm.  Resp: Breathing unlabored on room air; no wheezing. Psych: Fluid speech in conversation; appropriate affect; normal thought process Neuro: Sensation intact throughout. No gross coordination deficits.   Ortho Exam  General: Patient is well appearing and in no distress.  Skin and Muscle: Prior left thumb CMC incision well-healed. Muscle bulk and contour normal, no signs of atrophy.     Range of Motion and Palpation Tests: Mobility is full about the elbows with flexion and extension.  Forearm supination and pronation are 85/85 bilaterally.  Wrist flexion/extension is 75/65 bilaterally.  Digital flexion and extension are full.  Thumb opposition is full to the base of the small fingers bilaterally.    There is moderate swelling noted to the right index and long finger MCP with associated tenderness, range of motion is well-preserved without instability  Neurologic, Vascular, Motor: Sensation is intact to light touch in the median/radial/ulnar distributions.  Fingers pink and well perfused.  Capillary refill is brisk.      No results found for: "HGBA1C"   Imaging: No results found.  Past Medical/Family/Surgical/Social History: Medications & Allergies reviewed per EMR, new medications updated. 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 05/07/2020   Osteoarthritis of left AC (acromioclavicular) joint 05/07/2020   Impingement syndrome of left shoulder 02/08/2020   S/P TKR (total knee replacement) using cement, left 10/03/2019   Cellulitis of arm 08/28/2019   Essential hypertension 02/16/2018   Sepsis due to skin infection (HCC) 02/16/2018   Chronic pain 02/16/2018   Cellulitis of left lower extremity    Lumbar radiculopathy, chronic 01/24/2018   Lumbar stenosis with neurogenic claudication 03/23/2017   Hypothyroidism 07/25/2015   Primary osteoarthritis of right knee 07/23/2015   Primary osteoarthritis of knee 07/23/2015   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 ago, just gets jittery   Hypothyroidism    Lumbar stenosis    MRSA (methicillin resistant staph aureus) culture positive 09/10/2019   upper arm   OSA (obstructive sleep apnea)    "haven't wore mask in years; had OR; still snore" (04/12/2013)   Urinary frequency    No family history on file. Past Surgical History:  Procedure Laterality Date   BACK SURGERY     CARPOMETACARPEL SUSPENSION PLASTY Left 07/19/2020   Procedure: LEFT THUMB  CARPOMETACARPAL (CMC) ARTHROPLASTY;  Surgeon: Tarry Kos, MD;  Location: [facility];  Service: Orthopedics;  Laterality: Left;   CERVICAL FUSION  2002; 2012   CYSTECTOMY     INGUINAL HERNIA REPAIR Bilateral 1997   KNEE ARTHROSCOPY Left ~ 1997; ~ 2005   LUMBAR FUSION  2007; 04/11/2013   MICROLARYNGOSCOPY N/A 03/07/2021   Procedure: DIRECT MICROLARYNGOSCOPY WITH BIOPSY;  Surgeon: Drema Halon, MD;  Location:  SURGERY CENTER;  Service: ENT;  Laterality: N/A;   PENILE DEBRIDEMENT  ~ 2011   X 3  FOR MRSA INFECTION     SHOULDER ARTHROSCOPY Left ~ 2003   TONSILLECTOMY  1990's   TOTAL KNEE ARTHROPLASTY Right 07/23/2015   TOTAL KNEE ARTHROPLASTY Right 07/23/2015   Procedure: TOTAL KNEE ARTHROPLASTY;  Surgeon: Valeria Batman, MD;  Location[facility] OR;  Service: Orthopedics;  Laterality: Right;   TOTAL KNEE ARTHROPLASTY Left 10/03/2019   Procedure: LEFT TOTAL KNEE ARTHROPLASTY;  Surgeon: Valeria Batman, MD;  Location: [facility]S;  Service: Orthopedics;  Laterality: Left;   UVULOPALATOPHARYNGOPLASTY (UPPP)/TONSILLECTOMY/SEPTOPLASTY  1990's   VASECTOMY     Social History   Occupational History   Not on file  Tobacco Use   Smoking status: Former    Current packs/day: 0.00    Average packs/day: 0.5 packs/day for 15.0 years (7.5 ttl pk-yrs)  Types: Cigarettes    Start date: 10/15/1971    Quit date: 10/14/1986    Years since quitting: 36.8   Smokeless tobacco: Never  Vaping Use   Vaping status: Never Used  Substance and Sexual Activity   Alcohol use: Yes    Comment: occasional   Drug use: No   Sexual activity: Yes

## 2023-08-05 DIAGNOSIS — R7303 Prediabetes: Secondary | ICD-10-CM | POA: Diagnosis not present

## 2023-08-05 DIAGNOSIS — I10 Essential (primary) hypertension: Secondary | ICD-10-CM | POA: Diagnosis not present

## 2023-08-05 DIAGNOSIS — Z125 Encounter for screening for malignant neoplasm of prostate: Secondary | ICD-10-CM | POA: Diagnosis not present

## 2023-08-05 DIAGNOSIS — E7800 Pure hypercholesterolemia, unspecified: Secondary | ICD-10-CM | POA: Diagnosis not present

## 2023-08-05 DIAGNOSIS — E559 Vitamin D deficiency, unspecified: Secondary | ICD-10-CM | POA: Diagnosis not present

## 2023-08-10 ENCOUNTER — Other Ambulatory Visit (HOSPITAL_COMMUNITY): Payer: Self-pay

## 2023-08-10 DIAGNOSIS — R800 Isolated proteinuria: Secondary | ICD-10-CM | POA: Diagnosis not present

## 2023-08-10 DIAGNOSIS — E039 Hypothyroidism, unspecified: Secondary | ICD-10-CM | POA: Diagnosis not present

## 2023-08-10 DIAGNOSIS — Z0000 Encounter for general adult medical examination without abnormal findings: Secondary | ICD-10-CM | POA: Diagnosis not present

## 2023-08-10 DIAGNOSIS — Z23 Encounter for immunization: Secondary | ICD-10-CM | POA: Diagnosis not present

## 2023-08-10 DIAGNOSIS — I10 Essential (primary) hypertension: Secondary | ICD-10-CM | POA: Diagnosis not present

## 2023-08-10 DIAGNOSIS — I700 Atherosclerosis of aorta: Secondary | ICD-10-CM | POA: Diagnosis not present

## 2023-08-16 ENCOUNTER — Encounter: Payer: Self-pay | Admitting: Orthopedic Surgery

## 2023-08-16 ENCOUNTER — Encounter (HOSPITAL_BASED_OUTPATIENT_CLINIC_OR_DEPARTMENT_OTHER): Payer: Self-pay | Admitting: Orthopedic Surgery

## 2023-08-16 ENCOUNTER — Inpatient Hospital Stay: Payer: Medicare Other | Attending: Internal Medicine

## 2023-08-16 DIAGNOSIS — D751 Secondary polycythemia: Secondary | ICD-10-CM | POA: Diagnosis not present

## 2023-08-16 LAB — CBC WITH DIFFERENTIAL (CANCER CENTER ONLY)
Abs Immature Granulocytes: 0.07 10*3/uL (ref 0.00–0.07)
Basophils Absolute: 0 10*3/uL (ref 0.0–0.1)
Basophils Relative: 1 %
Eosinophils Absolute: 0.1 10*3/uL (ref 0.0–0.5)
Eosinophils Relative: 2 %
HCT: 52.3 % — ABNORMAL HIGH (ref 39.0–52.0)
Hemoglobin: 17.7 g/dL — ABNORMAL HIGH (ref 13.0–17.0)
Immature Granulocytes: 1 %
Lymphocytes Relative: 23 %
Lymphs Abs: 2 10*3/uL (ref 0.7–4.0)
MCH: 32.2 pg (ref 26.0–34.0)
MCHC: 33.8 g/dL (ref 30.0–36.0)
MCV: 95.1 fL (ref 80.0–100.0)
Monocytes Absolute: 0.6 10*3/uL (ref 0.1–1.0)
Monocytes Relative: 7 %
Neutro Abs: 5.7 10*3/uL (ref 1.7–7.7)
Neutrophils Relative %: 66 %
Platelet Count: 150 10*3/uL (ref 150–400)
RBC: 5.5 MIL/uL (ref 4.22–5.81)
RDW: 13.7 % (ref 11.5–15.5)
WBC Count: 8.5 10*3/uL (ref 4.0–10.5)
nRBC: 0 % (ref 0.0–0.2)

## 2023-08-16 LAB — CMP (CANCER CENTER ONLY)
ALT: 39 U/L (ref 0–44)
AST: 31 U/L (ref 15–41)
Albumin: 4.4 g/dL (ref 3.5–5.0)
Alkaline Phosphatase: 54 U/L (ref 38–126)
Anion gap: 8 (ref 5–15)
BUN: 24 mg/dL — ABNORMAL HIGH (ref 8–23)
CO2: 30 mmol/L (ref 22–32)
Calcium: 9.5 mg/dL (ref 8.9–10.3)
Chloride: 101 mmol/L (ref 98–111)
Creatinine: 1.12 mg/dL (ref 0.61–1.24)
GFR, Estimated: >60 mL/min (ref >60)
Glucose, Bld: 104 mg/dL — ABNORMAL HIGH (ref 70–99)
Potassium: 3.9 mmol/L (ref 3.5–5.1)
Sodium: 139 mmol/L (ref 135–145)
Total Bilirubin: 1.1 mg/dL (ref <1.2)
Total Protein: 6.9 g/dL (ref 6.5–8.1)

## 2023-08-16 LAB — IRON AND IRON BINDING CAPACITY (CC-WL,HP ONLY)
Iron: 117 ug/dL (ref 45–182)
Saturation Ratios: 25 % (ref 17.9–39.5)
TIBC: 465 ug/dL — ABNORMAL HIGH (ref 250–450)
UIBC: 348 ug/dL (ref 117–376)

## 2023-08-17 ENCOUNTER — Encounter (HOSPITAL_BASED_OUTPATIENT_CLINIC_OR_DEPARTMENT_OTHER)
Admission: RE | Admit: 2023-08-17 | Discharge: 2023-08-17 | Disposition: A | Discharge status: Home / Self Care | Payer: Medicare Other | Source: Ambulatory Visit | Attending: Orthopedic Surgery | Admitting: Orthopedic Surgery

## 2023-08-17 DIAGNOSIS — Z01812 Encounter for preprocedural laboratory examination: Secondary | ICD-10-CM | POA: Diagnosis not present

## 2023-08-17 LAB — FERRITIN: Ferritin: 55 ng/mL (ref 24–336)

## 2023-08-18 ENCOUNTER — Ambulatory Visit (HOSPITAL_COMMUNITY)
Admission: RE | Admit: 2023-08-18 | Discharge: 2023-08-18 | Disposition: A | Discharge status: Home / Self Care | Payer: Medicare Other | Source: Ambulatory Visit

## 2023-08-18 ENCOUNTER — Ambulatory Visit: Payer: Medicare Other | Admitting: Orthopedic Surgery

## 2023-08-18 DIAGNOSIS — I700 Atherosclerosis of aorta: Secondary | ICD-10-CM | POA: Insufficient documentation

## 2023-08-19 ENCOUNTER — Inpatient Hospital Stay (HOSPITAL_BASED_OUTPATIENT_CLINIC_OR_DEPARTMENT_OTHER): Payer: Medicare Other | Admitting: Hematology and Oncology

## 2023-08-19 DIAGNOSIS — D751 Secondary polycythemia: Secondary | ICD-10-CM

## 2023-08-19 NOTE — Assessment & Plan Note (Addendum)
Lab review 07/30/2022: Testosterone 492, hemoglobin 18.5,, hematocrit 54.3, platelets 169, creatinine 0.94, LFTs normal 09/01/2022: Hemoglobin 18.7, hematocrit 54.9, platelets 157, ferritin 132, creatinine 1.45, iron saturation 65% 09/01/2022: MPN panel: Normal (no JAK2 mutation) 09/01/2022: Hemochromatosis gene testing:c.187C>G (p.His63Asp) - Detected, heterozygous  11/18/2022: Hemoglobin 16.5, hematocrit 46, testosterone 624, ferritin 157, TSH 1.5, iron saturation 56% 02/08/2023: Hemoglobin 15.4, hematocrit 43.8, platelets 143, testosterone 791, iron saturation 31%, ferritin 55 05/13/2023: Hemoglobin 16, hematocrit 46.5, platelets 164, iron saturation 31%, ferritin 38 08/16/2023: Hemoglobin 17.7, hematocrit 52.3, iron saturation 25%, ferritin 55   Therefore patient has secondary polycythemia Also has heterozygous hemochromatosis   Treatment plan: Phlebotomy with a goal of ferritin of 50 Because of upcoming surgery, we will plan for phlebotomy in 1 month.     Recheck labs in 4 months and follow-up with a telephone visit

## 2023-08-19 NOTE — Progress Notes (Signed)
HEMATOLOGY-ONCOLOGY TELEPHONE VISIT PROGRESS NOTE  I connected with our patient on 08/19/23 at 12:45 PM EST by telephone and verified that I am speaking with the correct person using two identifiers.  I discussed the limitations, risks, security and privacy concerns of performing an evaluation and management service by telephone and the availability of in person appointments.  I also discussed with the patient that there may be a patient responsible charge related to this service. The patient expressed understanding and agreed to proceed.   History of Present Illness: Follow-up of secondary hemochromatosis  History of Present Illness   The patient, with a history of elevated hematocrit levels, presents for a routine follow-up. He reports that his hematocrit level is slightly elevated at 52.3, with a target level of below 50. He has not required a phlebotomy for the past six months. He notes feeling significantly better after previous phlebotomies, suggesting improved blood flow and rejuvenation of blood cells.  In addition to his hematocrit levels, the patient is scheduled for joint replacement surgery in the knuckles of his right hand. He expresses concern about the potential impact of his elevated hematocrit level on the surgery.  The patient also mentions a recent urinary tract infection and the discovery of gallstones during a scan. The scan also revealed some plaque in one of his arteries. He has recently undergone a coronary CT scan, but the results are not yet available.        REVIEW OF SYSTEMS:   Constitutional: Denies fevers, chills or abnormal weight loss All other systems were reviewed with the patient and are negative. Observations/Objective:     Assessment Plan:  Polycythemia, secondary Lab review 07/30/2022: Testosterone 492, hemoglobin 18.5,, hematocrit 54.3, platelets 169, creatinine 0.94, LFTs normal 09/01/2022: Hemoglobin 18.7, hematocrit 54.9, platelets 157, ferritin 132,  creatinine 1.45, iron saturation 65% 09/01/2022: MPN panel: Normal (no JAK2 mutation) 09/01/2022: Hemochromatosis gene testing:c.187C>G (p.His63Asp) - Detected, heterozygous  11/18/2022: Hemoglobin 16.5, hematocrit 46, testosterone 624, ferritin 157, TSH 1.5, iron saturation 56% 02/08/2023: Hemoglobin 15.4, hematocrit 43.8, platelets 143, testosterone 791, iron saturation 31%, ferritin 55 05/13/2023: Hemoglobin 16, hematocrit 46.5, platelets 164, iron saturation 31%, ferritin 38 08/16/2023: Hemoglobin 17.7, hematocrit 52.3, iron saturation 25%, ferritin 55   Therefore patient has secondary polycythemia Also has heterozygous hemochromatosis   Treatment plan: Phlebotomy with a goal of ferritin of 50 Because of upcoming surgery, we will plan for phlebotomy in 1 month.  Coronary Artery Disease Patient reports having a coronary CT scan due to incidental finding of plaque in an artery on a previous scan.     Recheck labs in 4 months and follow-up with a telephone visit   I discussed the assessment and treatment plan with the patient. The patient was provided an opportunity to ask questions and all were answered. The patient agreed with the plan and demonstrated an understanding of the instructions. The patient was advised to call back or seek an in-person evaluation if the symptoms worsen or if the condition fails to improve as anticipated.   I provided 12 minutes of non-face-to-face time during this encounter.  This includes time for charting and coordination of care   Tamsen Meek, MD

## 2023-08-23 DIAGNOSIS — I10 Essential (primary) hypertension: Secondary | ICD-10-CM | POA: Diagnosis not present

## 2023-08-23 DIAGNOSIS — G4733 Obstructive sleep apnea (adult) (pediatric): Secondary | ICD-10-CM | POA: Diagnosis not present

## 2023-08-24 ENCOUNTER — Ambulatory Visit (HOSPITAL_BASED_OUTPATIENT_CLINIC_OR_DEPARTMENT_OTHER): Payer: Medicare Other

## 2023-08-24 ENCOUNTER — Other Ambulatory Visit: Payer: Self-pay

## 2023-08-24 ENCOUNTER — Encounter (HOSPITAL_BASED_OUTPATIENT_CLINIC_OR_DEPARTMENT_OTHER): Payer: Self-pay | Admitting: Orthopedic Surgery

## 2023-08-24 ENCOUNTER — Encounter (HOSPITAL_BASED_OUTPATIENT_CLINIC_OR_DEPARTMENT_OTHER)
Admission: RE | Disposition: A | Discharge status: Home / Self Care | Payer: Self-pay | Source: Home / Self Care | Attending: Orthopedic Surgery

## 2023-08-24 ENCOUNTER — Ambulatory Visit (HOSPITAL_BASED_OUTPATIENT_CLINIC_OR_DEPARTMENT_OTHER): Payer: Medicare Other | Admitting: Anesthesiology

## 2023-08-24 ENCOUNTER — Ambulatory Visit (HOSPITAL_BASED_OUTPATIENT_CLINIC_OR_DEPARTMENT_OTHER)
Admission: RE | Admit: 2023-08-24 | Discharge: 2023-08-24 | Disposition: A | Discharge status: Home / Self Care | Payer: Medicare Other | Attending: Orthopedic Surgery | Admitting: Orthopedic Surgery

## 2023-08-24 DIAGNOSIS — M19031 Primary osteoarthritis, right wrist: Secondary | ICD-10-CM | POA: Diagnosis not present

## 2023-08-24 DIAGNOSIS — Z87891 Personal history of nicotine dependence: Secondary | ICD-10-CM | POA: Insufficient documentation

## 2023-08-24 DIAGNOSIS — J45909 Unspecified asthma, uncomplicated: Secondary | ICD-10-CM | POA: Diagnosis not present

## 2023-08-24 DIAGNOSIS — M19041 Primary osteoarthritis, right hand: Secondary | ICD-10-CM

## 2023-08-24 DIAGNOSIS — G4733 Obstructive sleep apnea (adult) (pediatric): Secondary | ICD-10-CM | POA: Diagnosis not present

## 2023-08-24 DIAGNOSIS — I10 Essential (primary) hypertension: Secondary | ICD-10-CM

## 2023-08-24 HISTORY — PX: FINGER ARTHROPLASTY: SHX5017

## 2023-08-24 SURGERY — ARTHROPLASTY, FINGER
Anesthesia: Monitor Anesthesia Care | Site: Finger | Laterality: Right

## 2023-08-24 MED ORDER — MIDAZOLAM HCL 5 MG/5ML IJ SOLN
INTRAMUSCULAR | Status: DC | PRN
  Administered 2023-08-24: .5 mg via INTRAVENOUS

## 2023-08-24 MED ORDER — KETOROLAC TROMETHAMINE 30 MG/ML IJ SOLN
INTRAMUSCULAR | Status: AC
  Filled 2023-08-24: qty 1

## 2023-08-24 MED ORDER — ACETAMINOPHEN 500 MG PO TABS
ORAL_TABLET | ORAL | Status: AC
  Filled 2023-08-24: qty 2

## 2023-08-24 MED ORDER — LACTATED RINGERS IV SOLN: INTRAVENOUS | Status: DC

## 2023-08-24 MED ORDER — PROPOFOL 10 MG/ML IV BOLUS
INTRAVENOUS | Status: DC | PRN
  Administered 2023-08-24: 20 mg via INTRAVENOUS

## 2023-08-24 MED ORDER — FENTANYL CITRATE (PF) 100 MCG/2ML IJ SOLN
INTRAMUSCULAR | Status: AC
  Filled 2023-08-24: qty 2

## 2023-08-24 MED ORDER — BUPIVACAINE-EPINEPHRINE (PF) 0.5% -1:200000 IJ SOLN
INTRAMUSCULAR | Status: DC | PRN
  Administered 2023-08-24: 30 mL via PERINEURAL

## 2023-08-24 MED ORDER — ONDANSETRON HCL 4 MG/2ML IJ SOLN
INTRAMUSCULAR | Status: DC | PRN
  Administered 2023-08-24: 4 mg via INTRAVENOUS

## 2023-08-24 MED ORDER — ACETAMINOPHEN 500 MG PO TABS
1000.0000 mg | ORAL_TABLET | Freq: Once | ORAL | Status: AC
  Administered 2023-08-24: 1000 mg via ORAL

## 2023-08-24 MED ORDER — EPHEDRINE 5 MG/ML INJ
INTRAVENOUS | Status: AC
  Filled 2023-08-24: qty 5

## 2023-08-24 MED ORDER — MIDAZOLAM HCL 2 MG/2ML IJ SOLN: 2.0000 mg | Freq: Once | INTRAMUSCULAR | Status: DC

## 2023-08-24 MED ORDER — FENTANYL CITRATE (PF) 100 MCG/2ML IJ SOLN
100.0000 ug | Freq: Once | INTRAMUSCULAR | Status: AC
  Administered 2023-08-24: 100 ug via INTRAVENOUS

## 2023-08-24 MED ORDER — MIDAZOLAM HCL 2 MG/2ML IJ SOLN
INTRAMUSCULAR | Status: AC
Start: 2023-08-24 — End: ?
  Filled 2023-08-24: qty 2

## 2023-08-24 MED ORDER — ONDANSETRON HCL 4 MG/2ML IJ SOLN
INTRAMUSCULAR | Status: AC
  Filled 2023-08-24: qty 2

## 2023-08-24 MED ORDER — ATROPINE SULFATE 0.4 MG/ML IV SOLN
INTRAVENOUS | Status: AC
  Filled 2023-08-24: qty 1

## 2023-08-24 MED ORDER — OXYCODONE HCL 5 MG PO TABS: 5.0000 mg | ORAL_TABLET | Freq: Four times a day (QID) | ORAL | 0 refills | Status: AC | PRN

## 2023-08-24 MED ORDER — LIDOCAINE 2% (20 MG/ML) 5 ML SYRINGE
INTRAMUSCULAR | Status: AC
  Filled 2023-08-24: qty 5

## 2023-08-24 MED ORDER — CEFAZOLIN SODIUM-DEXTROSE 2-4 GM/100ML-% IV SOLN
2.0000 g | INTRAVENOUS | Status: AC
  Administered 2023-08-24: 2 g via INTRAVENOUS

## 2023-08-24 MED ORDER — SUCCINYLCHOLINE CHLORIDE 200 MG/10ML IV SOSY
PREFILLED_SYRINGE | INTRAVENOUS | Status: AC
  Filled 2023-08-24: qty 10

## 2023-08-24 MED ORDER — PHENYLEPHRINE 80 MCG/ML (10ML) SYRINGE FOR IV PUSH (FOR BLOOD PRESSURE SUPPORT)
PREFILLED_SYRINGE | INTRAVENOUS | Status: AC
  Filled 2023-08-24: qty 10

## 2023-08-24 MED ORDER — 0.9 % SODIUM CHLORIDE (POUR BTL) OPTIME
TOPICAL | Status: DC | PRN
  Administered 2023-08-24: 1000 mL

## 2023-08-24 MED ORDER — CEFAZOLIN SODIUM-DEXTROSE 2-4 GM/100ML-% IV SOLN
INTRAVENOUS | Status: AC
  Filled 2023-08-24: qty 100

## 2023-08-24 MED ORDER — PROPOFOL 500 MG/50ML IV EMUL
INTRAVENOUS | Status: DC | PRN
  Administered 2023-08-24: 125 ug/kg/min via INTRAVENOUS

## 2023-08-24 MED ORDER — FENTANYL CITRATE (PF) 100 MCG/2ML IJ SOLN: 25.0000 ug | INTRAMUSCULAR | Status: DC | PRN

## 2023-08-24 SURGICAL SUPPLY — 57 items
BLADE AVERAGE 25X9 (BLADE) IMPLANT
BLADE SURG 15 STRL LF DISP TIS (BLADE) ×2 IMPLANT
BNDG COHESIVE 4X5 TAN STRL LF (GAUZE/BANDAGES/DRESSINGS) ×1 IMPLANT
BNDG ELASTIC 4INX 5YD STR LF (GAUZE/BANDAGES/DRESSINGS) ×2 IMPLANT
BNDG ESMARK 4X9 LF (GAUZE/BANDAGES/DRESSINGS) ×1 IMPLANT
BNDG GAUZE DERMACEA FLUFF 4 (GAUZE/BANDAGES/DRESSINGS) ×1 IMPLANT
BUR EGG 3PK/BX (BURR) IMPLANT
BUR OVAL CARBIDE 4.0 (BURR) IMPLANT
BUR PEAR (BURR) IMPLANT
CHLORAPREP W/TINT 26 (MISCELLANEOUS) ×1 IMPLANT
CORD BIPOLAR FORCEPS 12FT (ELECTRODE) ×1 IMPLANT
COVER BACK TABLE 60X90IN (DRAPES) ×1 IMPLANT
CUFF TOURN SGL QUICK 18X4 (TOURNIQUET CUFF) IMPLANT
CUFF TRNQT CYL 24X4X16.5-23 (TOURNIQUET CUFF) ×1 IMPLANT
DRAPE HAND 75INX146IN 110IN (DRAPES) ×1 IMPLANT
DRAPE OEC MINIVIEW 54X84 (DRAPES) ×1 IMPLANT
DRAPE SURG 17X23 STRL (DRAPES) ×1 IMPLANT
GAUZE PAD ABD 8X10 STRL (GAUZE/BANDAGES/DRESSINGS) IMPLANT
GAUZE SPONGE 4X4 12PLY STRL (GAUZE/BANDAGES/DRESSINGS) ×1 IMPLANT
GAUZE XEROFORM 1X8 LF (GAUZE/BANDAGES/DRESSINGS) ×1 IMPLANT
GLOVE BIO SURGEON STRL SZ7.5 (GLOVE) ×2 IMPLANT
GLOVE BIOGEL PI IND STRL 7.0 (GLOVE) IMPLANT
GLOVE BIOGEL PI IND STRL 7.5 (GLOVE) ×2 IMPLANT
GLOVE SURG SS PI 7.0 STRL IVOR (GLOVE) IMPLANT
GOWN STRL REUS W/ TWL LRG LVL3 (GOWN DISPOSABLE) ×1 IMPLANT
GOWN STRL REUS W/ TWL XL LVL3 (GOWN DISPOSABLE) IMPLANT
GOWN STRL REUS W/TWL XL LVL3 (GOWN DISPOSABLE) ×1 IMPLANT
GOWN STRL SURGICAL XL XLNG (GOWN DISPOSABLE) ×1 IMPLANT
IMPLANT FINGER JOINT SZ3 (Finger Joint) IMPLANT
MANIFOLD NEPTUNE II (INSTRUMENTS) ×1 IMPLANT
NDL HYPO 25X1 1.5 SAFETY (NEEDLE) IMPLANT
NDL HYPO 25X5/8 SAFETYGLIDE (NEEDLE) IMPLANT
NDL KEITH (NEEDLE) IMPLANT
NEEDLE HYPO 25X1 1.5 SAFETY (NEEDLE)
NEEDLE HYPO 25X5/8 SAFETYGLIDE (NEEDLE)
NEEDLE KEITH (NEEDLE)
NS IRRIG 1000ML POUR BTL (IV SOLUTION) IMPLANT
PACK BASIN DAY SURGERY FS (CUSTOM PROCEDURE TRAY) ×1 IMPLANT
PAD CAST 4YDX4 CTTN HI CHSV (CAST SUPPLIES) ×2 IMPLANT
SHEET MEDIUM DRAPE 40X70 STRL (DRAPES) ×1 IMPLANT
SLEEVE SCD COMPRESS KNEE MED (STOCKING) ×1 IMPLANT
SPIKE FLUID TRANSFER (MISCELLANEOUS) IMPLANT
SPLINT PLASTER CAST XFAST 4X15 (CAST SUPPLIES) ×10 IMPLANT
STOCKINETTE IMPERVIOUS 9X36 MD (GAUZE/BANDAGES/DRESSINGS) ×1 IMPLANT
SUCTION TUBE FRAZIER 10FR DISP (SUCTIONS) IMPLANT
SUT ETHILON 4 0 PS 2 18 (SUTURE) ×1 IMPLANT
SUT MNCRL AB 3-0 PS2 27 (SUTURE) ×1 IMPLANT
SUT PDS AB 4-0 RB1 27 (SUTURE) IMPLANT
SUT SILK 4 0 PS 2 (SUTURE) IMPLANT
SUT VIC AB 2-0 SH 27XBRD (SUTURE) IMPLANT
SUT VIC AB 3-0 FS2 27 (SUTURE) IMPLANT
SUT VIC AB 4-0 PS2 18 (SUTURE) IMPLANT
SYR BULB EAR ULCER 3OZ GRN STR (SYRINGE) ×2 IMPLANT
SYR CONTROL 10ML LL (SYRINGE) IMPLANT
TOWEL GREEN STERILE FF (TOWEL DISPOSABLE) ×2 IMPLANT
TUBE CONNECTING 20X1/4 (TUBING) IMPLANT
UNDERPAD 30X36 HEAVY ABSORB (UNDERPADS AND DIAPERS) ×1 IMPLANT

## 2023-08-24 NOTE — Interval H&P Note (Signed)
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  The various methods of treatment have been discussed with the patient and family. After consideration of risks, benefits and other options for treatment, the patient has consented to  Procedure(s): RIGHT INDEX/ LONG FINGER METACARPOPHANGEAL JOINT ARTHROPLASTY (Right) as a surgical intervention.  The patient's history has been reviewed, patient examined, no change in status, stable for surgery.  I have reviewed the patient's chart and labs.  Questions were answered to the patient's satisfaction.     Tomie Elko

## 2023-08-24 NOTE — Anesthesia Preprocedure Evaluation (Addendum)
Anesthesia Evaluation  Patient identified by MRN, date of birth, ID band Patient awake    Reviewed: Allergy & Precautions, H&P , NPO status , Patient's Chart, lab work & pertinent test results  History of Anesthesia Complications (+) DIFFICULT AIRWAY and history of anesthetic complications  Airway Mallampati: III  TM Distance: >3 FB Neck ROM: Full    Dental no notable dental hx. (+) Teeth Intact, Dental Advisory Given   Pulmonary sleep apnea and Continuous Positive Airway Pressure Ventilation , former smoker   Pulmonary exam normal breath sounds clear to auscultation       Cardiovascular hypertension, Pt. on medications  Rhythm:Regular Rate:Normal     Neuro/Psych  Headaches  negative psych ROS   GI/Hepatic negative GI ROS, Neg liver ROS,,,  Endo/Other  Hypothyroidism    Renal/GU negative Renal ROS  negative genitourinary   Musculoskeletal  (+) Arthritis , Osteoarthritis,    Abdominal   Peds  Hematology negative hematology ROS (+)   Anesthesia Other Findings   Reproductive/Obstetrics negative OB ROS                             Anesthesia Physical Anesthesia Plan  ASA: 3  Anesthesia Plan: MAC and Regional   Post-op Pain Management: Tylenol PO (pre-op)*   Induction: Intravenous  PONV Risk Score and Plan: 2 and Propofol infusion and Ondansetron  Airway Management Planned: Natural Airway and Simple Face Mask  Additional Equipment:   Intra-op Plan:   Post-operative Plan:   Informed Consent: I have reviewed the patients History and Physical, chart, labs and discussed the procedure including the risks, benefits and alternatives for the proposed anesthesia with the patient or authorized representative who has indicated his/her understanding and acceptance.     Dental advisory given  Plan Discussed with: CRNA  Anesthesia Plan Comments:        Anesthesia Quick  Evaluation

## 2023-08-24 NOTE — Transfer of Care (Signed)
Immediate Anesthesia Transfer of Care Note  Patient: Larry [name]  Procedure(s) Performed: RIGHT INDEX/ LONG FINGER METACARPOPHANGEAL JOINT ARTHROPLASTY (Right: Finger)  Patient Location: PACU  Anesthesia Type:MAC combined with regional for post-op pain  Level of Consciousness: awake, alert , oriented, drowsy, and patient cooperative  Airway & Oxygen Therapy: Patient Spontanous Breathing and Patient connected to face mask oxygen  Post-op Assessment: Report given to RN and Post -op Vital signs reviewed and stable  Post vital signs: Reviewed and stable  Last Vitals:  Vitals Value Taken Time  BP    Temp    Pulse 68 08/24/23 1215  Resp    SpO2 94 % 08/24/23 1215  Vitals shown include unfiled device data.  Last Pain:  Vitals:   08/24/23 0945  TempSrc: Oral  PainSc: 0-No pain      Patients Stated Pain Goal: 5 (08/24/23 0945)  Complications: No notable events documented.

## 2023-08-24 NOTE — Op Note (Signed)
NAME: Larry [name] MEDICAL RECORD NO: [id number] DATE OF BIRTH: 1950-12-06 FACILITY: [facility] LOCATION:  [facility] PHYSICIAN: Samuella Cota, MD   OPERATIVE REPORT   DATE OF PROCEDURE: 08/24/23    PREOPERATIVE DIAGNOSIS:   Right index and long finger MCP arthritis   POSTOPERATIVE DIAGNOSIS:   Right index and long finger MCP arthritis   PROCEDURE: Right index finger MCP arthroplasty Right long finger MCP arthroplasty   SURGEON:  Samuella Cota, M.D.   ASSISTANT: None   ANESTHESIA:  Regional with sedation   INTRAVENOUS FLUIDS:  Per anesthesia flow sheet.   ESTIMATED BLOOD LOSS:  Minimal.   COMPLICATIONS:  None.   SPECIMENS:  none   TOURNIQUET TIME:    Total Tourniquet Time Documented: Upper Arm (Right) - 69 minutes Total: Upper Arm (Right) - 69 minutes    DISPOSITION:  Stable to PACU.   INDICATIONS: This is a 72 year old male with longstanding index finger and long finger MCP arthritis that had become refractory to conservative management.  Discussion was had with the patient regarding his right index finger and long finger MCP arthritis.  We discussed treatment modalities ranging from conservative to surgical.  From a conservative standpoint, we discussed topical ointments, over-the-counter medications exercises.  From a surgical standpoint, we discussed potential for MCP arthroplasty versus arthrodesis.   Given that his symptoms are worsening, he would like to move forward with MCP arthroplasty to the index and long finger.  Risks and benefits of the procedure were discussed, risks including but not limited to infection, bleeding, scarring, stiffness, nerve injury, tendon injury, vascular injury, hardware complication, recurrence of symptoms and need for subsequent operation.  Patient expressed understanding.    OPERATIVE COURSE: Patient was seen and identified in the preoperative area and marked appropriately.  Surgical consent had been signed.  Preoperative IV antibiotic prophylaxis was given. He was transferred to the operating room and placed in supine position with the Right upper extremity on an arm board.  Sedation was induced by the anesthesiologist. A regional block had been performed by anesthesia in preoperative holding.    Right upper extremity was prepped and draped in normal sterile orthopedic fashion.  A surgical pause was performed between the surgeons, anesthesia, and operating room staff and all were in agreement as to the patient, procedure, and site of procedure.  Tourniquet was placed and padded appropriately to the right upper arm.  The arm was exsanguinated and the tourniquet was inflated to 250 mmHg.  A transverse incision was designed at the MCP level dorsally from the index finger to the long finger.  Dissection was carried down through subcutaneous tissues to expose the extensor tendons.  Extensor apparatus had remained well centralized to both the index and long finger.  Central split approach was utilized through the extensor apparatus of both the index and long finger.  The capsule was then incised longitudinally to expose the joint of both the index and long finger.  Dorsal synovectomy was performed.  Care was taken to preserve collateral ligaments.  We first began with the index finger.  The metacarpal head was removed utilizing a sagittal saw cut distal to the collateral ligaments, a second cut proximal and volarly was performed to remove the remaining portion of the metacarpal head.  We then performed the proximal phalanx articular surface resection with a minimal slice, care was taken to preserve the collateral ligaments.  There was no significant contracture of the capsule notable.  Once we were satisfied with our cuts,  we began with the starting all for the metacarpal.  This was followed by sequential broaching to appropriate sizing.  Starting all and then broaching was also performed for the proximal phalanx.  We  sized the index finger to a Swanson size 3, which was then trialed and noted to have appropriate stability.  Attention was then turned to the long finger.  The metacarpal head was removed utilizing a sagittal saw cut distal to the collateral ligaments, a second cut proximal and volarly was performed to remove the remaining portion of the metacarpal head.  We then performed the proximal phalanx articular surface resection with a minimal slice, care was taken to preserve the collateral ligaments.  There was no significant contracture of the capsule notable.  Once we were satisfied with our cuts, we began with the starting awl for the metacarpal.  This was followed by sequential broaching to appropriate sizing.  Starting awl and then broaching was also performed for the proximal phalanx.  We sized the long finger also to a Swanson size 3, which was then trialed and noted to have appropriate stability.  Copious irrigation was then performed of both MCP regions.  Final implants were selected, both size 3 Swanson silicone MCP arthroplasty, and placed atraumatically into the index and long finger MCP joint.  Appropriate stability was noted.  Biplanar fluoroscopy was utilized to confirm appropriate sizing and spacing at the MCP joints.  Gentle range of motion was performed of the digits to confirm appropriate stability.  The tourniquet was deflated at 69 minutes.  Fingertips were pink with brisk capillary refill after deflation of tourniquet.  Layered closure was then performed.  Capsular closure at the MCP joints was performed utilizing 2-0 Vicryl in horizontal mattress fashion.  Longitudinal split in the extensor apparatus to both the index and long finger was also repaired utilizing 2-0 Vicryl.  Skin closure was performed in layers, utilizing 3-0 Monocryl for the subcutaneous layer and 4-0 nylon for the skin surface.  Sterile dressings were applied followed by application of a clamshell splint for the hand.  The  operative drapes were broken down.  The patient was awoken from anesthesia safely and taken to PACU in stable condition.  I will see him back in the office in  2 weeks  for postoperative followup.     Samuella Cota, MD Electronically signed, 08/24/23

## 2023-08-24 NOTE — Discharge Instructions (Addendum)
Hand Surgery Postop Instructions  Next tylenol dose 3:45pm Dressings: Maintain postoperative dressing until orthopedic follow-up.  Keep operative site clean and dry until orthopedic follow-up.  Wound Care: Keep your hand elevated above the level of your heart.  Do not allow it to dangle by your side. Moving your fingers is advised to stimulate circulation but will depend on the site of your surgery.  If you have a splint applied, your doctor will advise you regarding movement.  Activity: Do not drive or operate machinery until clearance given from physician. No heavy lifting with operative extremity.  Diet:  Drink liquids today or eat a light diet.  You may resume a regular diet tomorrow.    General expectations: Take prescribed medication if given, transition to over-the-counter medication as quickly as possible. Fingers may become slightly swollen.  Call your doctor if any of the following occur: Severe pain not relieved by pain medication. Elevated temperature. Dressing soaked with blood. Inability to move fingers. White or bluish color to fingers.   Anshul Trevor Mace, M.D. Hand Surgery [facility]   Post Anesthesia Home Care Instructions  Activity: Get plenty of rest for the remainder of the day. A responsible individual must stay with you for 24 hours following the procedure.  For the next 24 hours, DO NOT: -Drive a car -Advertising copywriter -Drink alcoholic beverages -Take any medication unless instructed by your physician -Make any legal decisions or sign important papers.  Meals: Start with liquid foods such as gelatin or soup. Progress to regular foods as tolerated. Avoid greasy, spicy, heavy foods. If nausea and/or vomiting occur, drink only clear liquids until the nausea and/or vomiting subsides. Call your physician if vomiting continues.  Special Instructions/Symptoms: Your throat may feel dry or sore from the anesthesia or the breathing  tube placed in your throat during surgery. If this causes discomfort, gargle with warm salt water. The discomfort should disappear within 24 hours.  May have Tylenol after 5:45pm if needed.     Regional Anesthesia Blocks  1. You may not be able to move or feel the "blocked" extremity after a regional anesthetic block. This may last may last from 3-48 hours after placement, but it will go away. The length of time depends on the medication injected and your individual response to the medication. As the nerves start to wake up, you may experience tingling as the movement and feeling returns to your extremity. If the numbness and inability to move your extremity has not gone away after 48 hours, please call your surgeon.   2. The extremity that is blocked will need to be protected until the numbness is gone and the strength has returned. Because you cannot feel it, you will need to take extra care to avoid injury. Because it may be weak, you may have difficulty moving it or using it. You may not know what position it is in without looking at it while the block is in effect.  3. For blocks in the legs and feet, returning to weight bearing and walking needs to be done carefully. You will need to wait until the numbness is entirely gone and the strength has returned. You should be able to move your leg and foot normally before you try and bear weight or walk. You will need someone to be with you when you first try to ensure you do not fall and possibly risk injury.  4. Bruising and tenderness at the needle site are common side effects  and will resolve in a few days.  5. Persistent numbness or new problems with movement should be communicated to the surgeon or the [facility] [phone] [facility] [phone]).

## 2023-08-24 NOTE — Anesthesia Procedure Notes (Signed)
Anesthesia Regional Block: Supraclavicular block   Pre-Anesthetic Checklist: , timeout performed,  Correct Patient, Correct Site, Correct Laterality,  Correct Procedure, Correct Position, site marked,  Risks and benefits discussed,  Pre-op evaluation,  At surgeon's request and post-op pain management  Laterality: Right  Prep: Maximum Sterile Barrier Precautions used, chloraprep       Needles:  Injection technique: Single-shot  Needle Type: Echogenic Stimulator Needle     Needle Length: 9cm  Needle Gauge: 21     Additional Needles:   Procedures:,,,, ultrasound used (permanent image in chart),,    Narrative:  Start time: 08/24/2023 9:56 AM End time: 08/24/2023 10:06 AM Injection made incrementally with aspirations every 5 mL.  Performed by: Personally  Anesthesiologist: Gaynelle Adu, MD  Additional Notes:

## 2023-08-24 NOTE — Progress Notes (Signed)
Assisted Dr. Edmond Fitzgerald with right, supraclavicular, ultrasound guided block. Side rails up, monitors on throughout procedure. See vital signs in flow sheet. Tolerated Procedure well. 

## 2023-08-24 NOTE — Anesthesia Postprocedure Evaluation (Signed)
Anesthesia Post Note  Patient: Larry [name]  Procedure(s) Performed: RIGHT INDEX/ LONG FINGER METACARPOPHANGEAL JOINT ARTHROPLASTY (Right: Finger)     Patient location during evaluation: PACU Anesthesia Type: Regional and MAC Level of consciousness: awake and alert Pain management: pain level controlled Vital Signs Assessment: post-procedure vital signs reviewed and stable Respiratory status: spontaneous breathing, nonlabored ventilation and respiratory function stable Cardiovascular status: stable and blood pressure returned to baseline Postop Assessment: no apparent nausea or vomiting Anesthetic complications: no  No notable events documented.  Last Vitals:  Vitals:   08/24/23 1230 08/24/23 1257  BP: (!) 153/74 (!) 163/95  Pulse: 66 72  Resp: 14 18  Temp:  37.2 C  SpO2: 95% 96%    Last Pain:  Vitals:   08/24/23 1257  TempSrc: Oral  PainSc: 0-No pain                 Lakhia Gengler,W. EDMOND

## 2023-08-25 ENCOUNTER — Encounter (HOSPITAL_BASED_OUTPATIENT_CLINIC_OR_DEPARTMENT_OTHER): Payer: Self-pay | Admitting: Orthopedic Surgery

## 2023-09-06 ENCOUNTER — Encounter: Payer: Self-pay | Admitting: Rehabilitative and Restorative Service Providers"

## 2023-09-06 ENCOUNTER — Ambulatory Visit (INDEPENDENT_AMBULATORY_CARE_PROVIDER_SITE_OTHER): Payer: Medicare Other

## 2023-09-06 ENCOUNTER — Ambulatory Visit (INDEPENDENT_AMBULATORY_CARE_PROVIDER_SITE_OTHER): Payer: Medicare Other | Admitting: Rehabilitative and Restorative Service Providers"

## 2023-09-06 ENCOUNTER — Ambulatory Visit (INDEPENDENT_AMBULATORY_CARE_PROVIDER_SITE_OTHER): Payer: Medicare Other | Admitting: Orthopedic Surgery

## 2023-09-06 DIAGNOSIS — M6281 Muscle weakness (generalized): Secondary | ICD-10-CM

## 2023-09-06 DIAGNOSIS — M19041 Primary osteoarthritis, right hand: Secondary | ICD-10-CM

## 2023-09-06 DIAGNOSIS — R278 Other lack of coordination: Secondary | ICD-10-CM

## 2023-09-06 DIAGNOSIS — E291 Testicular hypofunction: Secondary | ICD-10-CM | POA: Diagnosis not present

## 2023-09-06 DIAGNOSIS — M25641 Stiffness of right hand, not elsewhere classified: Secondary | ICD-10-CM | POA: Diagnosis not present

## 2023-09-06 DIAGNOSIS — E039 Hypothyroidism, unspecified: Secondary | ICD-10-CM | POA: Diagnosis not present

## 2023-09-06 DIAGNOSIS — R600 Localized edema: Secondary | ICD-10-CM | POA: Diagnosis not present

## 2023-09-06 DIAGNOSIS — M79641 Pain in right hand: Secondary | ICD-10-CM

## 2023-09-06 NOTE — Therapy (Signed)
OUTPATIENT OCCUPATIONAL THERAPY ORTHO EVALUATION  Patient Name: Larry [name] MRN: [id number] DOB:Mar 27, 1951, 72 y.o., male Today's Date: 09/06/2023  PCP: Larry Fick, MD REFERRING PROVIDER: Samuella Cota, MD   END OF SESSION:  OT End of Session - 09/06/23 1309     Visit Number 1    Number of Visits 14    Date for OT Re-Evaluation 11/05/23    Authorization Type BCBS Medicare    OT Start Time 1302    OT Stop Time 1432    OT Time Calculation (min) 90 min    Equipment Utilized During Treatment orthosis materials    Activity Tolerance Patient tolerated treatment well;No increased pain;Patient limited by fatigue;Patient limited by pain    Behavior During Therapy [facility] for tasks assessed/performed             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 ago, just gets jittery   Hypothyroidism    Lumbar stenosis    MRSA (methicillin resistant staph aureus) culture positive 09/10/2019   upper arm   OSA (obstructive sleep apnea)    "haven't wore mask in years; had OR; still snore" (04/12/2013)   Urinary frequency    Past Surgical History:  Procedure Laterality Date   BACK SURGERY     CARPOMETACARPEL SUSPENSION PLASTY Left 07/19/2020   Procedure: LEFT THUMB CARPOMETACARPAL (CMC) ARTHROPLASTY;  Surgeon: Larry Kos, MD;  Location: [facility];  Service: Orthopedics;  Laterality: Left;   CERVICAL FUSION  2002; 2012   CYSTECTOMY     FINGER ARTHROPLASTY Right  08/24/2023   Procedure: RIGHT INDEX/ LONG FINGER METACARPOPHANGEAL JOINT ARTHROPLASTY;  Surgeon: Larry Cota, MD;  Location: [facility];  Service: Orthopedics;  Laterality: Right;  Regional block   INGUINAL HERNIA REPAIR Bilateral 1997   KNEE ARTHROSCOPY Left ~ 1997; ~ 2005   LUMBAR FUSION  2007; 04/11/2013   MICROLARYNGOSCOPY N/A 03/07/2021   Procedure: DIRECT MICROLARYNGOSCOPY WITH BIOPSY;  Surgeon: Larry Halon, MD;  Location: [facility];  Service: ENT;  Laterality: N/A;   PENILE DEBRIDEMENT  ~ 2011   X 3  FOR MRSA INFECTION     SHOULDER ARTHROSCOPY Left ~ 2003   TONSILLECTOMY  1990's   TOTAL KNEE ARTHROPLASTY Right 07/23/2015   TOTAL KNEE ARTHROPLASTY Right 07/23/2015   Procedure: TOTAL KNEE ARTHROPLASTY;  Surgeon: Larry Batman, MD;  Location: [facility];  Service: Orthopedics;  Laterality: Right;   TOTAL KNEE ARTHROPLASTY Left 10/03/2019   Procedure: LEFT TOTAL KNEE ARTHROPLASTY;  Surgeon: Larry Batman, MD;  Location: [facility];  Service: Orthopedics;  Laterality: Left;   UVULOPALATOPHARYNGOPLASTY (UPPP)/TONSILLECTOMY/SEPTOPLASTY  1990's   VASECTOMY     Patient Active Problem List   Diagnosis Date Noted   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 08/24/2023   Degenerative arthritis of metacarpophalangeal joint of  middle finger of right hand 08/24/2023   Polycythemia, secondary 09/01/2022   Hereditary hemochromatosis (HCC) 09/01/2022   Left carpal tunnel syndrome 07/07/2022   Acute thigh pain, right 05/01/2021   CMC (carpometacarpal joint) dislocation, left, initial encounter 11/12/2020   Post-operative state 08/20/2020   Nontraumatic incomplete tear of left rotator cuff 08/20/2020   AC (acromioclavicular) arthritis 08/20/2020   Complete tear of left rotator cuff 07/09/2020   Osteoarthritis of first carpometacarpal [facility]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 05/07/2020   Osteoarthritis of left AC (acromioclavicular)  joint 05/07/2020   Impingement syndrome of left shoulder 02/08/2020   S/P TKR (total knee replacement) using cement, left 10/03/2019   Cellulitis of arm 08/28/2019   Essential hypertension 02/16/2018   Sepsis due to skin infection (HCC) 02/16/2018   Chronic pain 02/16/2018   Cellulitis of left lower extremity    Lumbar radiculopathy, chronic 01/24/2018   Lumbar stenosis with neurogenic claudication 03/23/2017   Hypothyroidism 07/25/2015   Primary osteoarthritis of right knee 07/23/2015   Primary osteoarthritis of knee 07/23/2015    ONSET DATE: DOS: 08/24/23   REFERRING DIAG: M19.041 (ICD-10-CM) - Osteoarthritis of metacarpophalangeal (MCP) joint of right index finger   THERAPY DIAG:  Localized edema  Muscle weakness (generalized)  Other lack of coordination  Pain in right hand  Stiffness of right hand, not elsewhere classified  Rationale for Evaluation and Treatment: Rehabilitation  SUBJECTIVE:   SUBJECTIVE STATEMENT: He is now ~2 weeks s/p .  He states having pain for the past 6 to 8 months his right dominant hand MCP joints of index and middle fingers.  He has had a thumb arthroplasty in the left hand in the past, and decided to go through MCP joint arthroplasty in the right hand.  He is not having significant pain today just postsurgical soreness and swelling.  He does have a lot of stiffness and states that he cannot do many of his normal daily activities nor should he be this soon after surgery.  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   PERTINENT HISTORY:  Right index/long finger metacarpophangeal joint arthroplasty   PRECAUTIONS: None;  RED FLAGS:  None   WEIGHT BEARING RESTRICTIONS: Yes NWB Rt hand now   PAIN:  Are you having pain? Yes: NPRS scale: 1-2/10 Pain location: Rt hand sx area (MCPJs) Pain description: discomfort Aggravating factors: Moving and attempted weightbearing Relieving factors: Rest and ice  FALLS: Has patient  fallen in last 6 months? No   PLOF: Independent  PATIENT GOALS: To improve the use of his right dominant hand safely  NEXT MD VISIT: 10/04/2023   OBJECTIVE: (All objective assessments below are from initial evaluation on: 09/06/23 unless otherwise specified.)   HAND DOMINANCE: Right   ADLs: Overall ADLs: States decreased ability to grab, hold household objects, pain and difficulty to open containers, perform FMS tasks (manipulate fasteners on clothing), mild to moderate bathing problems as well.    FUNCTIONAL OUTCOME MEASURES: Eval: Quick DASH 66% impairment today  (Higher % Score  =  More Impairment)    UPPER EXTREMITY ROM     Shoulder to Wrist AROM Right eval  Wrist flexion 20  Wrist extension 21  (Blank rows = not tested)   Hand AROM Right eval  Full Fist Ability (or Gap to Distal Palmar Crease) Unable  >6cm gap from fingertips to palm  Thumb Opposition  (Kapandji Scale)  4/10  Thumb MCP (0-60)   Thumb IP (0-80)   Thumb Radial Abduction  Span   Thumb Palmar Abduction Span   Index MCP (0-90) (-17) - 25  Index PIP (0-100) (-22) - 37  Index DIP (0-70) 0- 12  Long MCP (0-90) ( -10) - 30   Long PIP (0-100) ( -26) - 55  Long DIP (0-70) 0-  22  Ring MCP (0-90)    Ring PIP (0-100)    Ring DIP (0-70)    Little MCP (0-90)    Little PIP (0-100)    Little DIP (0-70)    (Blank rows = not tested)   UPPER EXTREMITY MMT:    Eval:  NT at eval due to recent and still healing injuries. Will be tested when appropriate.   MMT Right TBD Left TBD  Shoulder flexion    Shoulder abduction    Shoulder adduction    Shoulder extension    Shoulder internal rotation    Shoulder external rotation    Middle trapezius    Lower trapezius    Elbow flexion    Elbow extension    Forearm supination    Forearm pronation    Wrist flexion    Wrist extension    Wrist ulnar deviation    Wrist radial deviation    (Blank rows = not tested)  HAND FUNCTION: Eval: Observed weakness in  affected right hand.  Details will be tested when safe Grip strength Right: TBD lbs, Left: TBD lbs   COORDINATION: Eval: Observed coordination impairments with affected right hand.  Details will be tested when able 9 Hole Peg Test Right: TBD sec (TBD sec is WFL)   SENSATION: Eval:  Light touch intact today, though diminished around sx area    EDEMA:   Eval:  Mildly swollen in right hand and wrist today  COGNITION: Eval: Overall cognitive status: WFL for evaluation today, friendly and very talkative  OBSERVATIONS:   Eval: No overt signs of infection, not overly tender to palpation.  Steri-Strips in place and clean.  Skin dry and flaking.  Presents with a lot of stiffness through the fingers and hand altogether   TODAY'S TREATMENT:  Post-evaluation treatment:   For safety/self-care he was educated on no gripping, pushing, pulling, lifting with the right hand now or until told to by therapy.  He does state trying to pick some things up and move his hand, and OT advises him to be very cautious and not do anything that is painful.  He should be moving his hand frequently, 4-6 times a day doing the following exercises that are listed below.  They should be done with gentle range of motion, no on forcefully and not with any pain.  They were demonstrated to him with explanation and he demonstrates back with OT supervision with no significant increases in pain.  Exercises - Reach arms upward   - 4 x daily - 10 reps - Bend and Pull Back Wrist SLOWLY  - 4 x daily - 10-15 reps - Tendon Glides  - 4-6 x daily - 3-5 reps - 2-3 seconds hold - BACK KNUCKLE STRETCHES   - 4 x daily - 3-5 reps - 15 sec hold - HOOK Stretch  - 4 x daily - 3-5 reps - 15-20 sec hold - PUSH KNUCKLES DOWN  - 4 x daily - 3-5 reps - 15 seconds hold - Thumb Opposition  - 4-6 x daily - 10 reps  Next to protect his hand postsurgically, custom orthosis fabrication was indicated for the daytime with MCP joint blocking brace that  extends  past the wrist and into the forearm, leaving the IP joints free to move.  OT could only get the MP joints to about a 35 degree bend today, so this may need adjusted to allow more MCP flexion in upcoming sessions.  He states this orthosis fits well, applies no pain or pressure, and he has no pain while wearing it now.  Additionally to keep him safe in the night, and help correct his current extension lag through digits 2 and 3, OT fabricates a second custom orthosis-a nighttime hand and wrist extension splint that has his wrist at about neutral and his fingers in maximal extension.  OT carefully shows him how to apply the straps for a light stretch to extend his fingers and he states he feels this but it is not painful.  He should loosen the straps if he wakes up with any type of pain.  This orthosis should only be worn in the night for now but could be worn in the day if he still has problems with extension lag of the fingers.   Lastly he was told to not soak his wound but he should be bathing at least once a day, not trying to tear off his Steri-Strips but letting them come off naturally over the course of 1-2 more weeks.  He leaves in no significant pain states understanding how to put on and off both of his orthoses and understanding his home exercise program and not to hurt himself or lift or grip or pull or squeeze anything.    PATIENT EDUCATION: Education details: See tx section above for details  Person educated: Patient Education method: Verbal Instruction, Teach back, Handouts  Education comprehension: States and demonstrates understanding, Additional Education required    HOME EXERCISE PROGRAM: Access Code: [email or web address] URL: [email or web address] Date: 09/06/2023 Prepared by: Fannie Knee   GOALS: Goals reviewed with patient? Yes   SHORT TERM GOALS: (STG required if POC>30 days) Target Date: 09/17/23  Pt will obtain protective, custom orthotic. Goal  status: 09/06/23: MET  2.  Pt will demo/state understanding of initial HEP to improve pain levels and prerequisite motion. Goal status: INITIAL   LONG TERM GOALS: Target Date: 11/05/23  Pt will improve functional ability by decreased impairment per Quick DASH assessment from 66% to 15% or better, for better quality of life. Goal status: INITIAL  2.  Pt will improve grip strength in Rt hand to at least 30lbs for functional use at home and in IADLs. Goal status: INITIAL  3.  Pt will improve A/ROM in Rt IF, MF TAM to at least 200* each, to have functional motion for tasks like reach and grasp.  Goal status: INITIAL  4.  Pt will improve strength in Rt wrist flex/ext to at least 4+/5 MMT to have increased functional ability to carry out selfcare and higher-level homecare tasks with less difficulty. Goal status: INITIAL  5.  Pt will improve coordination skills in Rt dom hand, as seen by [facility] score on nine-hole peg testing to have increased functional ability to carry out fine motor tasks (fasteners, etc.) and more complex, coordinated IADLs (meal prep, sports, etc.).  Goal status: INITIAL  6.  Pt will decrease pain at rest  from 1-2/10 to 0/10 to have better sleep and occupational participation in daily roles. Goal status: INITIAL   ASSESSMENT:  CLINICAL IMPRESSION: Patient is a 72 y.o. male who was seen today for occupational therapy evaluation for right hand digit 2 and 3 MCP joint arthroplasties and subsequent  stiffness, swelling, pain and decreased functional ability.  He will benefit from outpatient occupational therapy to increase motion, strength, function.Marland Kitchen   PERFORMANCE DEFICITS: in functional skills including ADLs, IADLs, coordination, dexterity, proprioception, sensation, edema, ROM, strength, pain, fascial restrictions, flexibility, Fine motor control, Gross motor control, body mechanics, endurance, decreased knowledge of precautions, wound, and UE functional use, cognitive skills  including problem solving and safety awareness, and psychosocial skills including coping strategies, environmental adaptation, and habits.   IMPAIRMENTS: are limiting patient from ADLs, IADLs, work, leisure, and social participation.   COMORBIDITIES: may have co-morbidities  that affects occupational performance. Patient will benefit from skilled OT to address above impairments and improve overall function.  MODIFICATION OR ASSISTANCE TO COMPLETE EVALUATION: Min-Moderate modification of tasks or assist with assess necessary to complete an evaluation.  OT OCCUPATIONAL PROFILE AND HISTORY: Problem focused assessment: Including review of records relating to presenting problem.  CLINICAL DECISION MAKING: Moderate - several treatment options, min-mod task modification necessary  REHAB POTENTIAL: Excellent  EVALUATION COMPLEXITY: Low      PLAN:  OT FREQUENCY: 1-2x/week  OT DURATION: 8 weeks through 11/05/2023 and up to 14 total visits as needed  PLANNED INTERVENTIONS: 97168 OT Re-evaluation, 97535 self care/ADL training, 54098 therapeutic exercise, 97530 therapeutic activity, 97112 neuromuscular re-education, 97140 manual therapy, 97035 ultrasound, 97039 fluidotherapy, 97010 moist heat, 97010 cryotherapy, 97034 contrast bath, 97760 Orthotics management and training, 11914 Splinting (initial encounter), [id number] Subsequent splinting/medication, scar mobilization, passive range of motion, compression bandaging, Dry needling, coping strategies training, and patient/family education  RECOMMENDED OTHER SERVICES: none now   CONSULTED AND AGREED WITH PLAN OF CARE: Patient  PLAN FOR NEXT SESSION:   Check orthoses as needed, check initial recommendations and home exercise program and see if motion and swelling are improving   Fannie Knee, OTR/L, CHT 09/06/2023, 4:51 PM

## 2023-09-06 NOTE — Progress Notes (Unsigned)
   Larry [name] - 71 y.o. male MRN [id number]  Date of birth: 08/28/1951  Office Visit Note: Visit Date: 09/06/2023 PCP: Georgianne Fick, MD Referred by: Georgianne Fick, MD  Subjective:  HPI: Larry [name] is a 72 y.o. male who presents today for follow up 2 weeks status post right index/long finger metacarpophangeal joint arthroplasty.  Pertinent ROS were reviewed with the patient and found to be negative unless otherwise specified above in HPI.   Assessment & Plan: Visit Diagnoses: No diagnosis found.  Plan: ***  Follow-up: No follow-ups on file.   Meds & Orders: No orders of the defined types were placed in this encounter.  No orders of the defined types were placed in this encounter.    Procedures: No procedures performed       Objective:   Vital Signs: There were no vitals taken for this visit.  Ortho Exam ***  Imaging: No results found.   Jaimin Krupka Trevor Mace, M.D. [facility] 10:49 AM

## 2023-09-07 NOTE — Therapy (Incomplete)
OUTPATIENT OCCUPATIONAL THERAPY TREATMENT NOTE  Patient Name: Larry [name] MRN: [id number] DOB:November 26, 1950, 72 y.o., male Today's Date: 09/07/2023  PCP: Georgianne Fick, MD REFERRING PROVIDER: Samuella Cota, MD   END OF SESSION:    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 ago, just gets jittery   Hypothyroidism    Lumbar stenosis    MRSA (methicillin resistant staph aureus) culture positive 09/10/2019   upper arm   OSA (obstructive sleep apnea)    "haven't wore mask in years; had OR; still snore" (04/12/2013)   Urinary frequency    Past Surgical History:  Procedure Laterality Date   BACK SURGERY     CARPOMETACARPEL SUSPENSION PLASTY Left 07/19/2020   Procedure: LEFT THUMB CARPOMETACARPAL (CMC) ARTHROPLASTY;  Surgeon: Tarry Kos, MD;  Location: [facility];  Service: Orthopedics;  Laterality: Left;   CERVICAL FUSION  2002; 2012   CYSTECTOMY     FINGER ARTHROPLASTY Right 08/24/2023   Procedure: RIGHT INDEX/ LONG FINGER METACARPOPHANGEAL JOINT ARTHROPLASTY;  Surgeon: Samuella Cota, MD;  Location: [facility];  Service: Orthopedics;  Laterality: Right;  Regional block   INGUINAL HERNIA REPAIR Bilateral 1997   KNEE ARTHROSCOPY Left ~ 1997; ~ 2005   LUMBAR FUSION  2007; 04/11/2013   MICROLARYNGOSCOPY N/A 03/07/2021   Procedure: DIRECT MICROLARYNGOSCOPY WITH BIOPSY;  Surgeon: Drema Halon, MD;  Location: [facility];   Service: ENT;  Laterality: N/A;   PENILE DEBRIDEMENT  ~ 2011   X 3  FOR MRSA INFECTION     SHOULDER ARTHROSCOPY Left ~ 2003   TONSILLECTOMY  1990's   TOTAL KNEE ARTHROPLASTY Right 07/23/2015   TOTAL KNEE ARTHROPLASTY Right 07/23/2015   Procedure: TOTAL KNEE ARTHROPLASTY;  Surgeon: Valeria Batman, MD;  Location: [facility] OR;  Service: Orthopedics;  Laterality: Right;   TOTAL KNEE ARTHROPLASTY Left 10/03/2019   Procedure: LEFT TOTAL KNEE ARTHROPLASTY;  Surgeon: Valeria Batman, MD;  Location: [facility];  Service: Orthopedics;  Laterality: Left;   UVULOPALATOPHARYNGOPLASTY (UPPP)/TONSILLECTOMY/SEPTOPLASTY  1990's   VASECTOMY     Patient Active Problem List   Diagnosis Date Noted   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 08/24/2023   Degenerative arthritis of metacarpophalangeal joint of middle finger of right hand 08/24/2023   Polycythemia, secondary 09/01/2022   Hereditary hemochromatosis (HCC) 09/01/2022   Left carpal tunnel syndrome 07/07/2022   Acute thigh pain, right 05/01/2021   CMC (carpometacarpal joint) dislocation, left, initial encounter 11/12/2020   Post-operative state 08/20/2020   Nontraumatic incomplete tear of left rotator cuff 08/20/2020   AC (acromioclavicular) arthritis 08/20/2020   Complete tear of left rotator cuff 07/09/2020   Osteoarthritis of first carpometacarpal [facility]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 05/07/2020   Osteoarthritis of left AC (acromioclavicular) joint 05/07/2020   Impingement syndrome of left shoulder 02/08/2020   S/P TKR (total  knee replacement) using cement, left 10/03/2019   Cellulitis of arm 08/28/2019   Essential hypertension 02/16/2018   Sepsis due to skin infection (HCC) 02/16/2018   Chronic pain 02/16/2018   Cellulitis of left lower extremity    Lumbar radiculopathy, chronic 01/24/2018   Lumbar stenosis with neurogenic claudication 03/23/2017   Hypothyroidism 07/25/2015   Primary osteoarthritis of right knee  07/23/2015   Primary osteoarthritis of knee 07/23/2015    ONSET DATE: DOS: 08/24/23   REFERRING DIAG: M19.041 (ICD-10-CM) - 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  Rationale for Evaluation and Treatment: Rehabilitation  PERTINENT HISTORY:  Right index/long finger metacarpophangeal joint arthroplasty  He states having pain for the past 6 to 8 months his right dominant hand MCP joints of index and middle fingers.  He has had a thumb arthroplasty in the left hand in the past, and decided to go through MCP joint arthroplasty in the right hand.  He is not having significant pain today just postsurgical soreness and swelling.  He does have a lot of stiffness and states that he cannot do many of his normal daily activities nor should he be this soon after surgery.  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  PRECAUTIONS: None;  RED FLAGS:  None   WEIGHT BEARING RESTRICTIONS: Yes NWB Rt hand now    SUBJECTIVE:   SUBJECTIVE STATEMENT: He is now 2 weeks s/p MCP J arthoplasties in Rt hand digits 2,3.  He states ***.     PAIN:  Are you having pain? Yes: NPRS scale: *** 1-2/10 Pain location: Rt hand sx area (MCPJs) Pain description: discomfort Aggravating factors: Moving and attempted weightbearing Relieving factors: Rest and ice   PATIENT GOALS: To improve the use of his right dominant hand safely  NEXT MD VISIT: 10/04/2023   OBJECTIVE: (All objective assessments below are from initial evaluation on: 09/06/23 unless otherwise specified.)   HAND DOMINANCE: Right   ADLs: Overall ADLs: States decreased ability to grab, hold household objects, pain and difficulty to open containers, perform FMS tasks (manipulate fasteners on clothing), mild to moderate bathing problems as well.    FUNCTIONAL OUTCOME MEASURES: Eval: Quick DASH 66% impairment today  (Higher % Score  =  More Impairment)     UPPER EXTREMITY ROM     Shoulder to Wrist AROM Right eval  Wrist flexion 20  Wrist extension 21  (Blank rows = not tested)   Hand AROM Right eval Rt 09/09/23  Full Fist Ability (or Gap to Distal Palmar Crease) Unable  >6cm gap from fingertips to palm   Thumb Opposition  (Kapandji Scale)  4/10   Thumb MCP (0-60)    Thumb IP (0-80)    Thumb Radial Abduction Span    Thumb Palmar Abduction Span    Index MCP (0-90) (-17) - 25  *** - ***  Index PIP (0-100) (-22) - 37  *** - ***  Index DIP (0-70) 0- 12  *** - ***  Long MCP (0-90) ( -10) - 30   *** - ***  Long PIP (0-100) ( -26) - 55  *** - ***  Long DIP (0-70) 0-  22  *** - ***  Ring MCP (0-90)     Ring PIP (0-100)     Ring DIP (0-70)     Little MCP (0-90)     Little PIP (0-100)     Little DIP (0-70)     (  Blank rows = not tested)   UPPER EXTREMITY MMT:    Eval:  NT at eval due to recent and still healing injuries. Will be tested when appropriate.   MMT Right TBD Left TBD  Shoulder flexion    Shoulder abduction    Shoulder adduction    Shoulder extension    Shoulder internal rotation    Shoulder external rotation    Middle trapezius    Lower trapezius    Elbow flexion    Elbow extension    Forearm supination    Forearm pronation    Wrist flexion    Wrist extension    Wrist ulnar deviation    Wrist radial deviation    (Blank rows = not tested)  HAND FUNCTION: Eval: Observed weakness in affected right hand.  Details will be tested when safe Grip strength Right: TBD lbs, Left: TBD lbs   COORDINATION: Eval: Observed coordination impairments with affected right hand.  Details will be tested when able 9 Hole Peg Test Right: TBD sec (TBD sec is [facility])   SENSATION: Eval:  Light touch intact today, though diminished around sx area    EDEMA:   09/09/23: ***cm swelling   Eval:  Mildly swollen in right hand and wrist today  OBSERVATIONS:   Eval: No overt signs of infection, not overly tender to palpation.   Steri-Strips in place and clean.  Skin dry and flaking.  Presents with a lot of stiffness through the fingers and hand altogether   TODAY'S TREATMENT:  09/09/23: ***  Check orthoses as needed, check initial recommendations and home exercise program and see if motion and swelling are improving   Post-evaluation treatment:   For safety/self-care he was educated on no gripping, pushing, pulling, lifting with the right hand now or until told to by therapy.  He does state trying to pick some things up and move his hand, and OT advises him to be very cautious and not do anything that is painful.  He should be moving his hand frequently, 4-6 times a day doing the following exercises that are listed below.  They should be done with gentle range of motion, no on forcefully and not with any pain.  They were demonstrated to him with explanation and he demonstrates back with OT supervision with no significant increases in pain.  Exercises - Reach arms upward   - 4 x daily - 10 reps - Bend and Pull Back Wrist SLOWLY  - 4 x daily - 10-15 reps - Tendon Glides  - 4-6 x daily - 3-5 reps - 2-3 seconds hold - BACK KNUCKLE STRETCHES   - 4 x daily - 3-5 reps - 15 sec hold - HOOK Stretch  - 4 x daily - 3-5 reps - 15-20 sec hold - PUSH KNUCKLES DOWN  - 4 x daily - 3-5 reps - 15 seconds hold - Thumb Opposition  - 4-6 x daily - 10 reps  Next to protect his hand postsurgically, custom orthosis fabrication was indicated for the daytime with MCP joint blocking brace that extends past the wrist and into the forearm, leaving the IP joints free to move.  OT could only get the MP joints to about a 35 degree bend today, so this may need adjusted to allow more MCP flexion in upcoming sessions.  He states this orthosis fits well, applies no pain or pressure, and he has no pain while wearing it now.  Additionally to keep him safe in the night, and help correct  his current extension lag through digits 2 and 3, OT fabricates a  second custom orthosis-a nighttime hand and wrist extension splint that has his wrist at about neutral and his fingers in maximal extension.  OT carefully shows him how to apply the straps for a light stretch to extend his fingers and he states he feels this but it is not painful.  He should loosen the straps if he wakes up with any type of pain.  This orthosis should only be worn in the night for now but could be worn in the day if he still has problems with extension lag of the fingers.   Lastly he was told to not soak his wound but he should be bathing at least once a day, not trying to tear off his Steri-Strips but letting them come off naturally over the course of 1-2 more weeks.  He leaves in no significant pain states understanding how to put on and off both of his orthoses and understanding his home exercise program and not to hurt himself or lift or grip or pull or squeeze anything.    PATIENT EDUCATION: Education details: See tx section above for details  Person educated: Patient Education method: Verbal Instruction, Teach back, Handouts  Education comprehension: States and demonstrates understanding, Additional Education required    HOME EXERCISE PROGRAM: Access Code: [email or web address] URL: [email or web address] Date: 09/06/2023 Prepared by: Fannie Knee   GOALS: Goals reviewed with patient? Yes   SHORT TERM GOALS: (STG required if POC>30 days) Target Date: 09/17/23  Pt will obtain protective, custom orthotic. Goal status: 09/06/23: MET  2.  Pt will demo/state understanding of initial HEP to improve pain levels and prerequisite motion. Goal status: INITIAL   LONG TERM GOALS: Target Date: 11/05/23  Pt will improve functional ability by decreased impairment per Quick DASH assessment from 66% to 15% or better, for better quality of life. Goal status: INITIAL  2.  Pt will improve grip strength in Rt hand to at least 30lbs for functional use at home and in  IADLs. Goal status: INITIAL  3.  Pt will improve A/ROM in Rt IF, MF TAM to at least 200* each, to have functional motion for tasks like reach and grasp.  Goal status: INITIAL  4.  Pt will improve strength in Rt wrist flex/ext to at least 4+/5 MMT to have increased functional ability to carry out selfcare and higher-level homecare tasks with less difficulty. Goal status: INITIAL  5.  Pt will improve coordination skills in Rt dom hand, as seen by [facility] score on nine-hole peg testing to have increased functional ability to carry out fine motor tasks (fasteners, etc.) and more complex, coordinated IADLs (meal prep, sports, etc.).  Goal status: INITIAL  6.  Pt will decrease pain at rest  from 1-2/10 to 0/10 to have better sleep and occupational participation in daily roles. Goal status: INITIAL   ASSESSMENT:  CLINICAL IMPRESSION: 09/09/23: ***  Eval: Patient is a 72 y.o. male who was seen today for occupational therapy evaluation for right hand digit 2 and 3 MCP joint arthroplasties and subsequent stiffness, swelling, pain and decreased functional ability.  He will benefit from outpatient occupational therapy to increase motion, strength, function.Marland Kitchen    PLAN:  OT FREQUENCY: 1-2x/week  OT DURATION: 8 weeks through 11/05/2023 and up to 14 total visits as needed  PLANNED INTERVENTIONS: 97168 OT Re-evaluation, 97535 self care/ADL training, 54098 therapeutic exercise, 97530 therapeutic activity, 97112 neuromuscular re-education, 97140 manual therapy, 97035 ultrasound, 97039 fluidotherapy, 97010  moist heat, 97010 cryotherapy, 97034 contrast bath, 97760 Orthotics management and training, 24401 Splinting (initial encounter), [phone] Subsequent splinting/medication, scar mobilization, passive range of motion, compression bandaging, Dry needling, coping strategies training, and patient/family education   CONSULTED AND AGREED WITH PLAN OF CARE: Patient  PLAN FOR NEXT SESSION:   ***  Fannie Knee,  OTR/L, CHT 09/07/2023, 5:19 PM

## 2023-09-08 NOTE — Therapy (Signed)
OUTPATIENT OCCUPATIONAL THERAPY TREATMENT NOTE  Patient Name: Larry [name] MRN: [id number] DOB:11/02/1950, 72 y.o., male Today's Date: 09/09/2023  PCP: Georgianne Fick, MD REFERRING PROVIDER: Samuella Cota, MD   END OF SESSION:  OT End of Session - 09/09/23 1553     Visit Number 2    Number of Visits 14    Date for OT Re-Evaluation 11/05/23    Authorization Type [employer] Medicare    OT Start Time 1554    OT Stop Time 1632    OT Time Calculation (min) 38 min    Equipment Utilized During Treatment --    Activity Tolerance Patient tolerated treatment well;No increased pain;Patient limited by fatigue    Behavior During Therapy [facility] for tasks assessed/performed              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 ago, just gets jittery   Hypothyroidism    Lumbar stenosis    MRSA (methicillin resistant staph aureus) culture positive 09/10/2019   upper arm   OSA (obstructive sleep apnea)    "haven't wore mask in years; had OR; still snore" (04/12/2013)   Urinary frequency    Past Surgical History:  Procedure Laterality Date   BACK SURGERY     CARPOMETACARPEL SUSPENSION PLASTY Left 07/19/2020   Procedure: LEFT THUMB CARPOMETACARPAL (CMC) ARTHROPLASTY;  Surgeon: Tarry Kos, MD;  Location: [facility];  Service: Orthopedics;  Laterality: Left;   CERVICAL FUSION  2002; 2012   CYSTECTOMY     FINGER ARTHROPLASTY Right 08/24/2023   Procedure: RIGHT INDEX/ LONG  FINGER METACARPOPHANGEAL JOINT ARTHROPLASTY;  Surgeon: Samuella Cota, MD;  Location: [facility];  Service: Orthopedics;  Laterality: Right;  Regional block   INGUINAL HERNIA REPAIR Bilateral 1997   KNEE ARTHROSCOPY Left ~ 1997; ~ 2005   LUMBAR FUSION  2007; 04/11/2013   MICROLARYNGOSCOPY N/A 03/07/2021   Procedure: DIRECT MICROLARYNGOSCOPY WITH BIOPSY;  Surgeon: Drema Halon, MD;  Location: [facility];  Service: ENT;  Laterality: N/A;   PENILE DEBRIDEMENT  ~ 2011   X 3  FOR MRSA INFECTION     SHOULDER ARTHROSCOPY Left ~ 2003   TONSILLECTOMY  1990's   TOTAL KNEE ARTHROPLASTY Right 07/23/2015   TOTAL KNEE ARTHROPLASTY Right 07/23/2015   Procedure: TOTAL KNEE ARTHROPLASTY;  Surgeon: Valeria Batman, MD;  Location: [facility] OR;  Service: Orthopedics;  Laterality: Right;   TOTAL KNEE ARTHROPLASTY Left 10/03/2019   Procedure: LEFT TOTAL KNEE ARTHROPLASTY;  Surgeon: Valeria Batman, MD;  Location: [facility];  Service: Orthopedics;  Laterality: Left;   UVULOPALATOPHARYNGOPLASTY (UPPP)/TONSILLECTOMY/SEPTOPLASTY  1990's   VASECTOMY     Patient Active Problem List   Diagnosis Date Noted   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 08/24/2023   Degenerative arthritis of metacarpophalangeal joint of middle finger of  right hand 08/24/2023   Polycythemia, secondary 09/01/2022   Hereditary hemochromatosis (HCC) 09/01/2022   Left carpal tunnel syndrome 07/07/2022   Acute thigh pain, right 05/01/2021   CMC (carpometacarpal joint) dislocation, left, initial encounter 11/12/2020   Post-operative state 08/20/2020   Nontraumatic incomplete tear of left rotator cuff 08/20/2020   AC (acromioclavicular) arthritis 08/20/2020   Complete tear of left rotator cuff 07/09/2020   Osteoarthritis of first carpometacarpal [facility]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 05/07/2020   Osteoarthritis of left AC (acromioclavicular) joint 05/07/2020   Impingement syndrome of  left shoulder 02/08/2020   S/P TKR (total knee replacement) using cement, left 10/03/2019   Cellulitis of arm 08/28/2019   Essential hypertension 02/16/2018   Sepsis due to skin infection (HCC) 02/16/2018   Chronic pain 02/16/2018   Cellulitis of left lower extremity    Lumbar radiculopathy, chronic 01/24/2018   Lumbar stenosis with neurogenic claudication 03/23/2017   Hypothyroidism 07/25/2015   Primary osteoarthritis of right knee 07/23/2015   Primary osteoarthritis of knee 07/23/2015    ONSET DATE: DOS: 08/24/23   REFERRING DIAG: M19.041 (ICD-10-CM) - 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  He has had a thumb arthroplasty in the left hand in the past, and decided to go through MCP joint arthroplasty in the right hand.  He is not having significant pain today just postsurgical soreness and swelling.  He does have a lot of stiffness and states that he cannot do many of his normal daily activities nor should he be this soon after surgery.  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  PRECAUTIONS: None;  RED FLAGS:  None   WEIGHT BEARING RESTRICTIONS: Yes NWB Rt hand now    SUBJECTIVE:   SUBJECTIVE STATEMENT: He is now 2 weeks s/p MCP J arthoplasties in Rt hand digits 2,3.  He states doing HEP without issues, no significant pain or swelling, both orthoses ar eworking well.  He seems to be focusing on flexion > ext.     PAIN:  Are you having pain? Yes: NPRS scale: 0-1/10 Pain location: Rt hand sx area  (MCPJs) Pain description: discomfort Aggravating factors: Moving and attempted weightbearing Relieving factors: Rest and ice   PATIENT GOALS: To improve the use of his right dominant hand safely  NEXT MD VISIT: 10/04/2023   OBJECTIVE: (All objective assessments below are from initial evaluation on: 09/06/23 unless otherwise specified.)   HAND DOMINANCE: Right   ADLs: Overall ADLs: States decreased ability to grab, hold household objects, pain and difficulty to open containers, perform FMS tasks (manipulate fasteners on clothing), mild to moderate bathing problems as well.    FUNCTIONAL OUTCOME MEASURES: Eval: Quick DASH 66% impairment today  (Higher % Score  =  More Impairment)    UPPER EXTREMITY ROM     Shoulder to Wrist AROM Right eval Rt 09/09/23  Wrist flexion 20 55  Wrist extension 21 45  (Blank rows = not tested)   Hand AROM Right eval Rt 09/09/23  Full Fist Ability (or Gap to Distal Palmar Crease) Unable  >6cm gap  from fingertips to palm   Thumb Opposition  (Kapandji Scale)  4/10   Thumb MCP (0-60)    Thumb IP (0-80)    Thumb Radial Abduction Span    Thumb Palmar Abduction Span    Index MCP (0-90) (-17) - 25  (-16) - 35  Index PIP (0-100) (-22) - 37  (-14) - 84  Index DIP (0-70) 0- 12  0 - 32  Long MCP (0-90) ( -10) - 30   (-10)  - 40  Long PIP (0-100) ( -26) - 55  (-25)  - 88  Long DIP (0-70) 0-  22  0 - 35  Ring MCP (0-90)     Ring PIP (0-100)     Ring DIP (0-70)     Little MCP (0-90)     Little PIP (0-100)     Little DIP (0-70)     (Blank rows = not tested)   UPPER EXTREMITY MMT:    Eval:  NT at eval due to recent and still healing injuries. Will be tested when appropriate.   MMT Right TBD Left TBD  Shoulder flexion    Shoulder abduction    Shoulder adduction    Shoulder extension    Shoulder internal rotation    Shoulder external rotation    Middle trapezius    Lower trapezius    Elbow flexion    Elbow extension    Forearm supination     Forearm pronation    Wrist flexion    Wrist extension    Wrist ulnar deviation    Wrist radial deviation    (Blank rows = not tested)  HAND FUNCTION: Eval: Observed weakness in affected right hand.  Details will be tested when safe Grip strength Right: TBD lbs, Left: TBD lbs   COORDINATION: 09/09/23:  9 Hole Peg Test Right: 31 sec (30 sec is WFL, 22sec is AVG)   Eval: Observed coordination impairments with affected right hand.  Details will be tested when able   SENSATION: Eval:  Light touch intact today, though diminished around sx area    EDEMA:   09/09/23: 22.8cm swelling circumferentially about Rt MCP Js today (21.2cm in Lt hand)  OBSERVATIONS:   Eval: No overt signs of infection, not overly tender to palpation.  Steri-Strips in place and clean.  Skin dry and flaking.  Presents with a lot of stiffness through the fingers and hand altogether   TODAY'S TREATMENT:  09/09/23: He starts with active range of motion for exercise as well as new measures which shows significant improvement through the IP joints for flexion.  His orthosis in the day time was adjusted to add more MP joint flexion now that he is looser.  OT can achieve 40 degrees MP joint flexion today without him having any pain or discomfort.   His whole home exercise program was: Over carefully with him, him demonstrating each exercise back and doing very well.  OT additionally as 1 more exercise today as bolded below to try to help decrease IP joint extension lag in the index and middle fingers.  He had no pain with this "reverse blocking" and states feeling a nice healthy stretch through the PIP joints.   Lastly, to help him manage some mild hypersensitivity that he is feeling proximal to his scars, OT advises to rub and touch around the scar for 2 to 3 minutes, 3-4 times a day to help reeducate those new nerves.  Today he also does fine motor skills activity with  nine-hole peg test showing only very slight functional  deficit.   Exercises - Reach arms upward   - 4 x daily - 10 reps - Bend and Pull Back Wrist SLOWLY  - 4 x daily - 10-15 reps - Tendon Glides  - 4-6 x daily - 3-5 reps - 2-3 seconds hold - BACK KNUCKLE STRETCHES   - 4 x daily - 3-5 reps - 15 sec hold - HOOK Stretch  - 4 x daily - 3-5 reps - 15-20 sec hold - PUSH KNUCKLES DOWN  - 4 x daily - 3-5 reps - 15 seconds hold - Thumb Opposition  - 4-6 x daily - 10 reps - Hand AROM Reverse Blocking  - 4-6 x daily - 10-15 reps   PATIENT EDUCATION: Education details: See tx section above for details  Person educated: Patient Education method: Verbal Instruction, Teach back, Handouts  Education comprehension: States and demonstrates understanding, Additional Education required    HOME EXERCISE PROGRAM: Access Code: [email or web address] URL: [email or web address] Date: 09/06/2023 Prepared by: Fannie Knee   GOALS: Goals reviewed with patient? Yes   SHORT TERM GOALS: (STG required if POC>30 days) Target Date: 09/17/23  Pt will obtain protective, custom orthotic. Goal status: 09/06/23: MET  2.  Pt will demo/state understanding of initial HEP to improve pain levels and prerequisite motion. Goal status: INITIAL   LONG TERM GOALS: Target Date: 11/05/23  Pt will improve functional ability by decreased impairment per Quick DASH assessment from 66% to 15% or better, for better quality of life. Goal status: INITIAL  2.  Pt will improve grip strength in Rt hand to at least 30lbs for functional use at home and in IADLs. Goal status: INITIAL  3.  Pt will improve A/ROM in Rt IF, MF TAM to at least 200* each, to have functional motion for tasks like reach and grasp.  Goal status: INITIAL  4.  Pt will improve strength in Rt wrist flex/ext to at least 4+/5 MMT to have increased functional ability to carry out selfcare and higher-level homecare tasks with less difficulty. Goal status: INITIAL  5.  Pt will improve coordination skills in Rt  dom hand, as seen by [facility] score on nine-hole peg testing to have increased functional ability to carry out fine motor tasks (fasteners, etc.) and more complex, coordinated IADLs (meal prep, sports, etc.).  Goal status: INITIAL  6.  Pt will decrease pain at rest  from 1-2/10 to 0/10 to have better sleep and occupational participation in daily roles. Goal status: INITIAL   ASSESSMENT:  CLINICAL IMPRESSION: 09/09/23: He is doing excellent and has improved his range of motion and flexion significantly.  His leg in extension is not any worse, but it is also not any better.  We added a new reverse blocking exercise to assist with that today.  We also discussed him "dosing" extension just is much as flexion.  He is doing well and moving quickly but was advised not to do anything painful as to hurt himself.    PLAN:  OT FREQUENCY: 1-2x/week  OT DURATION: 8 weeks through 11/05/2023 and up to 14 total visits as needed  PLANNED INTERVENTIONS: 97168 OT Re-evaluation, 97535 self care/ADL training, 54098 therapeutic exercise, 97530 therapeutic activity, 97112 neuromuscular re-education, 97140 manual therapy, 97035 ultrasound, 97039 fluidotherapy, 97010 moist heat, 97010 cryotherapy, 97034 contrast bath, 97760 Orthotics management and training, 11914 Splinting (initial encounter), [id number] Subsequent splinting/medication, scar mobilization, passive range of motion, compression bandaging, Dry needling, coping strategies training, and patient/family education  CONSULTED AND AGREED WITH PLAN OF CARE: Patient  PLAN FOR NEXT SESSION:   Continue to adjust daytime orthosis to get 45 degrees MCP flexion, check home exercise program and motion, perform manual therapy and modalities as needed to help with stiffness, swelling, etc.  Consider dynamic flexion orthosis to assist MP joints with flexion, also consider using buddy straps to help promote flexion during the day.  Continue to monitor extension lag  Fannie Knee, OTR/L, CHT 09/09/2023, 5:02 PM

## 2023-09-09 ENCOUNTER — Ambulatory Visit: Payer: Medicare Other | Admitting: Rehabilitative and Restorative Service Providers"

## 2023-09-09 ENCOUNTER — Encounter: Payer: Self-pay | Admitting: Rehabilitative and Restorative Service Providers"

## 2023-09-09 ENCOUNTER — Encounter: Payer: Medicare Other | Admitting: Rehabilitative and Restorative Service Providers"

## 2023-09-09 DIAGNOSIS — R278 Other lack of coordination: Secondary | ICD-10-CM | POA: Diagnosis not present

## 2023-09-09 DIAGNOSIS — M6281 Muscle weakness (generalized): Secondary | ICD-10-CM

## 2023-09-09 DIAGNOSIS — R600 Localized edema: Secondary | ICD-10-CM | POA: Diagnosis not present

## 2023-09-09 DIAGNOSIS — M79641 Pain in right hand: Secondary | ICD-10-CM | POA: Diagnosis not present

## 2023-09-09 DIAGNOSIS — M25641 Stiffness of right hand, not elsewhere classified: Secondary | ICD-10-CM

## 2023-09-13 NOTE — Therapy (Signed)
OUTPATIENT OCCUPATIONAL THERAPY TREATMENT NOTE  Patient Name: Larry [name] MRN: [id number] DOB:January 09, 1951, 72 y.o., male Today's Date: 09/14/2023  PCP: Georgianne Fick, MD REFERRING PROVIDER: Samuella Cota, MD   END OF SESSION:  OT End of Session - 09/14/23 1528     Visit Number 3    Number of Visits 14    Date for OT Re-Evaluation 11/05/23    Authorization Type BCBS Medicare    OT Start Time 1527    OT Stop Time 1615    OT Time Calculation (min) 48 min    Equipment Utilized During Treatment orthosis materials    Activity Tolerance Patient tolerated treatment well;No increased pain;Patient limited by fatigue    Behavior During Therapy [facility] for tasks assessed/performed               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 ago, just gets jittery   Hypothyroidism    Lumbar stenosis    MRSA (methicillin resistant staph aureus) culture positive 09/10/2019   upper arm   OSA (obstructive sleep apnea)    "haven't wore mask in years; had OR; still snore" (04/12/2013)   Urinary frequency    Past Surgical History:  Procedure Laterality Date   BACK SURGERY     CARPOMETACARPEL SUSPENSION PLASTY Left 07/19/2020   Procedure: LEFT THUMB CARPOMETACARPAL (CMC) ARTHROPLASTY;  Surgeon: Tarry Kos, MD;  Location: [facility];  Service: Orthopedics;  Laterality: Left;   CERVICAL FUSION  2002; 2012   CYSTECTOMY     FINGER ARTHROPLASTY Right 08/24/2023   Procedure:  RIGHT INDEX/ LONG FINGER METACARPOPHANGEAL JOINT ARTHROPLASTY;  Surgeon: Samuella Cota, MD;  Location: [facility];  Service: Orthopedics;  Laterality: Right;  Regional block   INGUINAL HERNIA REPAIR Bilateral 1997   KNEE ARTHROSCOPY Left ~ 1997; ~ 2005   LUMBAR FUSION  2007; 04/11/2013   MICROLARYNGOSCOPY N/A 03/07/2021   Procedure: DIRECT MICROLARYNGOSCOPY WITH BIOPSY;  Surgeon: Drema Halon, MD;  Location: [facility];  Service: ENT;  Laterality: N/A;   PENILE DEBRIDEMENT  ~ 2011   X 3  FOR MRSA INFECTION     SHOULDER ARTHROSCOPY Left ~ 2003   TONSILLECTOMY  1990's   TOTAL KNEE ARTHROPLASTY Right 07/23/2015   TOTAL KNEE ARTHROPLASTY Right 07/23/2015   Procedure: TOTAL KNEE ARTHROPLASTY;  Surgeon: Valeria Batman, MD;  Location: [facility] OR;  Service: Orthopedics;  Laterality: Right;   TOTAL KNEE ARTHROPLASTY Left 10/03/2019   Procedure: LEFT TOTAL KNEE ARTHROPLASTY;  Surgeon: Valeria Batman, MD;  Location: [facility];  Service: Orthopedics;  Laterality: Left;   UVULOPALATOPHARYNGOPLASTY (UPPP)/TONSILLECTOMY/SEPTOPLASTY  1990's   VASECTOMY     Patient Active Problem List   Diagnosis Date Noted   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 08/24/2023   Degenerative arthritis of metacarpophalangeal joint of middle  finger of right hand 08/24/2023   Polycythemia, secondary 09/01/2022   Hereditary hemochromatosis (HCC) 09/01/2022   Left carpal tunnel syndrome 07/07/2022   Acute thigh pain, right 05/01/2021   CMC (carpometacarpal joint) dislocation, left, initial encounter 11/12/2020   Post-operative state 08/20/2020   Nontraumatic incomplete tear of left rotator cuff 08/20/2020   AC (acromioclavicular) arthritis 08/20/2020   Complete tear of left rotator cuff 07/09/2020   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 05/07/2020   Osteoarthritis of left AC (acromioclavicular) joint 05/07/2020    Impingement syndrome of left shoulder 02/08/2020   S/P TKR (total knee replacement) using cement, left 10/03/2019   Cellulitis of arm 08/28/2019   Essential hypertension 02/16/2018   Sepsis due to skin infection (HCC) 02/16/2018   Chronic pain 02/16/2018   Cellulitis of left lower extremity    Lumbar radiculopathy, chronic 01/24/2018   Lumbar stenosis with neurogenic claudication 03/23/2017   Hypothyroidism 07/25/2015   Primary osteoarthritis of right knee 07/23/2015   Primary osteoarthritis of knee 07/23/2015    ONSET DATE: DOS: 08/24/23   REFERRING DIAG: M19.041 (ICD-10-CM) - 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  He has had a thumb arthroplasty in the left hand in the past, and decided to go through MCP joint arthroplasty in the right hand.  He is not having significant pain today just postsurgical soreness and swelling.  He does have a lot of stiffness and states that he cannot do many of his normal daily activities nor should he be this soon after surgery.  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  PRECAUTIONS: None;  RED FLAGS:  None   WEIGHT BEARING RESTRICTIONS: Yes NWB Rt hand now    SUBJECTIVE:   SUBJECTIVE STATEMENT: He is now 3 weeks s/p MCP J arthoplasties in Rt hand digits 2,3.  He states doing well still having no pain, staying moving and not wearing his daytime brace much at all.     PAIN:  Are you having pain? Yes: NPRS scale: 0/10 Pain location: Rt hand sx area (MCPJs) Pain  description: discomfort Aggravating factors: Moving and attempted weightbearing Relieving factors: Rest and ice   PATIENT GOALS: To improve the use of his right dominant hand safely  NEXT MD VISIT: 10/04/2023   OBJECTIVE: (All objective assessments below are from initial evaluation on: 09/06/23 unless otherwise specified.)   HAND DOMINANCE: Right   ADLs: Overall ADLs: States decreased ability to grab, hold household objects, pain and difficulty to open containers, perform FMS tasks (manipulate fasteners on clothing), mild to moderate bathing problems as well.    FUNCTIONAL OUTCOME MEASURES: Eval: Quick DASH 66% impairment today  (Higher % Score  =  More Impairment)    UPPER EXTREMITY ROM     Shoulder to Wrist AROM Right eval Rt 09/09/23  Wrist flexion 20 55  Wrist extension 21 45  (Blank rows = not tested)   Hand AROM Right eval Rt 09/09/23 Rt 09/14/23  Full Fist Ability (or Gap to Distal Palmar Crease) Unable  >6cm gap from fingertips to  palm  3.4cm gap from finger tips to [facility]   Thumb Opposition  (Kapandji Scale)  4/10  6/10  Thumb MCP (0-60)     Thumb IP (0-80)     Thumb Radial Abduction Span     Thumb Palmar Abduction Span     Index MCP (0-90) (-17) - 25  (-16) - 35 (-14)  - 37  Index PIP (0-100) (-22) - 37  (-14) - 84 (-7) - 94  Index DIP (0-70) 0- 12  0 - 32 0 - 54  Long MCP (0-90) ( -10) - 30   (-10)  - 40 (-11) - 50  Long PIP (0-100) ( -26) - 55  (-25)  - 88 (-28) - 97  Long DIP (0-70) 0-  22  0 - 35 0 - 48  Ring MCP (0-90)      Ring PIP (0-100)      Ring DIP (0-70)      Little MCP (0-90)      Little PIP (0-100)      Little DIP (0-70)      (Blank rows = not tested)   UPPER EXTREMITY MMT:    Eval:  NT at eval due to recent and still healing injuries. Will be tested when appropriate.   MMT Right TBD Left TBD  Shoulder flexion    Shoulder abduction    Shoulder adduction    Shoulder extension    Shoulder internal rotation    Shoulder external  rotation    Middle trapezius    Lower trapezius    Elbow flexion    Elbow extension    Forearm supination    Forearm pronation    Wrist flexion    Wrist extension    Wrist ulnar deviation    Wrist radial deviation    (Blank rows = not tested)  HAND FUNCTION: Eval: Observed weakness in affected right hand.  Details will be tested when safe Grip strength Right: TBD lbs, Left: TBD lbs   COORDINATION: 09/09/23:  9 Hole Peg Test Right: 31 sec (30 sec is WFL, 22sec is AVG)   Eval: Observed coordination impairments with affected right hand.  Details will be tested when able   SENSATION: Eval:  Light touch intact today, though diminished around sx area    EDEMA:   09/14/23: 21.6cm  swelling circumferentially about Rt MCP Js today  09/09/23: 22.8cm swelling circumferentially about Rt MCP Js today (21.2cm in Lt hand)  OBSERVATIONS:   Eval: No overt signs of infection, not overly tender to palpation.  Steri-Strips in place and clean.  Skin dry and flaking.  Presents with a lot of stiffness through the fingers and hand altogether   TODAY'S TREATMENT:  09/14/23: He starts with active range of motion for exercise as well as new measures today which shows great improvement in the middle finger with the MCP joint of the index finger is really stiff.  OT did speak with the surgeon about fabricating a dynamic flexion orthosis for the MP joints and he was in agreement that this would be beneficial for the patient, so today OT custom fabricates a dynamic flexion orthosis targeting the MP joints of the index finger and middle finger of the right hand.   He tolerates wearing this for small amount of time today and is told to wear it on a light tension only for 30 minutes 2-3 times a day as tolerated.  Additionally his daytime braces modified to achieve a 45 degree flexion at the  MP joints.  OT also reviews his home exercise program with him in its entirety which seems to be working well for him.   Lastly, OT reminds him to lift nothing greater than 5 Pounds, do nothing painful, continue to be cautious as he is only 3 weeks post surgery now.  Additionally it looks like he peeled off his Steri-Strips because they are all removed now and he has slight areas that are not quite healed and he was cautioned to cover them with a Band-Aid, use some Vaseline as a protective barrier.  He states not having any significant pain or concerns at the end of the session   Exercises reviewed today - Reach arms upward   - 4 x daily - 10 reps - Bend and Pull Back Wrist SLOWLY  - 4 x daily - 10-15 reps - Tendon Glides  - 4-6 x daily - 3-5 reps - 2-3 seconds hold - BACK KNUCKLE STRETCHES   - 4 x daily - 3-5 reps - 15 sec hold - HOOK Stretch  - 4 x daily - 3-5 reps - 15-20 sec hold - PUSH KNUCKLES DOWN  - 4 x daily - 3-5 reps - 15 seconds hold - Thumb Opposition  - 4-6 x daily - 10 reps - Hand AROM Reverse Blocking  - 4-6 x daily - 10-15 reps   PATIENT EDUCATION: Education details: See tx section above for details  Person educated: Patient Education method: Verbal Instruction, Teach back, Handouts  Education comprehension: States and demonstrates understanding, Additional Education required    HOME EXERCISE PROGRAM: Access Code: [email or web address] URL: [email or web address] Date: 09/06/2023 Prepared by: Fannie Knee   GOALS: Goals reviewed with patient? Yes   SHORT TERM GOALS: (STG required if POC>30 days) Target Date: 09/17/23  Pt will obtain protective, custom orthotic. Goal status: 09/06/23: MET  2.  Pt will demo/state understanding of initial HEP to improve pain levels and prerequisite motion. Goal status: 09/14/2023: Met   LONG TERM GOALS: Target Date: 11/05/23  Pt will improve functional ability by decreased impairment per Quick DASH assessment from 66% to 15% or better, for better quality of life. Goal status: INITIAL  2.  Pt will improve grip strength in Rt hand to at least  30lbs for functional use at home and in IADLs. Goal status: INITIAL  3.  Pt will improve A/ROM in Rt IF, MF TAM to at least 200* each, to have functional motion for tasks like reach and grasp.  Goal status: INITIAL  4.  Pt will improve strength in Rt wrist flex/ext to at least 4+/5 MMT to have increased functional ability to carry out selfcare and higher-level homecare tasks with less difficulty. Goal status: INITIAL  5.  Pt will improve coordination skills in Rt dom hand, as seen by [facility] score on nine-hole peg testing to have increased functional ability to carry out fine motor tasks (fasteners, etc.) and more complex, coordinated IADLs (meal prep, sports, etc.).  Goal status: INITIAL  6.  Pt will decrease pain at rest  from 1-2/10 to 0/10 to have better sleep and occupational participation in daily roles. Goal status: INITIAL   ASSESSMENT:  CLINICAL IMPRESSION: 09/14/23: He is doing well only 3 weeks postop but index finger MCP joint was very stiff, so OT fabricates dynamic flexion orthosis today.  We will see him back on Thursday to see if this is has a positive effect      PLAN:  OT FREQUENCY: 1-2x/week  OT DURATION: 8 weeks through 11/05/2023  and up to 14 total visits as needed  PLANNED INTERVENTIONS: 97168 OT Re-evaluation, 97535 self care/ADL training, 16109 therapeutic exercise, 97530 therapeutic activity, 97112 neuromuscular re-education, 97140 manual therapy, 97035 ultrasound, 97039 fluidotherapy, 97010 moist heat, 97010 cryotherapy, 97034 contrast bath, 97760 Orthotics management and training, 60454 Splinting (initial encounter), [id number] Subsequent splinting/medication, scar mobilization, passive range of motion, compression bandaging, Dry needling, coping strategies training, and patient/family education   CONSULTED AND AGREED WITH PLAN OF CARE: Patient  PLAN FOR NEXT SESSION:   Check new dynamic flexion orthosis, as tolerated perform manual therapy and modalities as  helpful  Fannie Knee, OTR/L, CHT 09/14/2023, 4:48 PM

## 2023-09-14 ENCOUNTER — Encounter: Payer: Self-pay | Admitting: Rehabilitative and Restorative Service Providers"

## 2023-09-14 ENCOUNTER — Ambulatory Visit (INDEPENDENT_AMBULATORY_CARE_PROVIDER_SITE_OTHER): Payer: Medicare Other | Admitting: Rehabilitative and Restorative Service Providers"

## 2023-09-14 DIAGNOSIS — M79641 Pain in right hand: Secondary | ICD-10-CM | POA: Diagnosis not present

## 2023-09-14 DIAGNOSIS — R600 Localized edema: Secondary | ICD-10-CM

## 2023-09-14 DIAGNOSIS — M25641 Stiffness of right hand, not elsewhere classified: Secondary | ICD-10-CM

## 2023-09-14 DIAGNOSIS — R278 Other lack of coordination: Secondary | ICD-10-CM

## 2023-09-14 DIAGNOSIS — M6281 Muscle weakness (generalized): Secondary | ICD-10-CM | POA: Diagnosis not present

## 2023-09-16 ENCOUNTER — Encounter: Payer: Medicare Other | Admitting: Rehabilitative and Restorative Service Providers"

## 2023-09-16 DIAGNOSIS — E039 Hypothyroidism, unspecified: Secondary | ICD-10-CM | POA: Diagnosis not present

## 2023-09-16 DIAGNOSIS — D751 Secondary polycythemia: Secondary | ICD-10-CM | POA: Diagnosis not present

## 2023-09-16 DIAGNOSIS — E291 Testicular hypofunction: Secondary | ICD-10-CM | POA: Diagnosis not present

## 2023-09-17 ENCOUNTER — Ambulatory Visit (INDEPENDENT_AMBULATORY_CARE_PROVIDER_SITE_OTHER): Payer: Medicare Other | Admitting: Rehabilitative and Restorative Service Providers"

## 2023-09-17 ENCOUNTER — Encounter: Payer: Self-pay | Admitting: Rehabilitative and Restorative Service Providers"

## 2023-09-17 DIAGNOSIS — M6281 Muscle weakness (generalized): Secondary | ICD-10-CM | POA: Diagnosis not present

## 2023-09-17 DIAGNOSIS — M25641 Stiffness of right hand, not elsewhere classified: Secondary | ICD-10-CM

## 2023-09-17 DIAGNOSIS — M79641 Pain in right hand: Secondary | ICD-10-CM

## 2023-09-17 DIAGNOSIS — R278 Other lack of coordination: Secondary | ICD-10-CM | POA: Diagnosis not present

## 2023-09-17 DIAGNOSIS — R600 Localized edema: Secondary | ICD-10-CM

## 2023-09-17 NOTE — Therapy (Signed)
OUTPATIENT OCCUPATIONAL THERAPY TREATMENT NOTE  Patient Name: Larry [name] MRN: [id number] DOB:1951-09-09, 72 y.o., male Today's Date: 09/17/2023  PCP: Georgianne Fick, MD REFERRING PROVIDER: Samuella Cota, MD   END OF SESSION:  OT End of Session - 09/17/23 1055     Visit Number 4    Number of Visits 14    Date for OT Re-Evaluation 11/05/23    Authorization Type BCBS Medicare    OT Start Time 1058    OT Stop Time 1152    OT Time Calculation (min) 54 min    Equipment Utilized During Treatment Buddy straps    Activity Tolerance Patient tolerated treatment well;No increased pain;Patient limited by fatigue    Behavior During Therapy [facility] for tasks assessed/performed                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 ago, just gets jittery   Hypothyroidism    Lumbar stenosis    MRSA (methicillin resistant staph aureus) culture positive 09/10/2019   upper arm   OSA (obstructive sleep apnea)    "haven't wore mask in years; had OR; still snore" (04/12/2013)   Urinary frequency    Past Surgical History:  Procedure Laterality Date   BACK SURGERY     CARPOMETACARPEL SUSPENSION PLASTY Left 07/19/2020   Procedure: LEFT THUMB CARPOMETACARPAL (CMC) ARTHROPLASTY;  Surgeon: Tarry Kos, MD;  Location: [facility];  Service: Orthopedics;  Laterality: Left;   CERVICAL FUSION  2002; 2012   CYSTECTOMY     FINGER ARTHROPLASTY Right 08/24/2023   Procedure:  RIGHT INDEX/ LONG FINGER METACARPOPHANGEAL JOINT ARTHROPLASTY;  Surgeon: Samuella Cota, MD;  Location: [facility];  Service: Orthopedics;  Laterality: Right;  Regional block   INGUINAL HERNIA REPAIR Bilateral 1997   KNEE ARTHROSCOPY Left ~ 1997; ~ 2005   LUMBAR FUSION  2007; 04/11/2013   MICROLARYNGOSCOPY N/A 03/07/2021   Procedure: DIRECT MICROLARYNGOSCOPY WITH BIOPSY;  Surgeon: Drema Halon, MD;  Location: [facility];  Service: ENT;  Laterality: N/A;   PENILE DEBRIDEMENT  ~ 2011   X 3  FOR MRSA INFECTION     SHOULDER ARTHROSCOPY Left ~ 2003   TONSILLECTOMY  1990's   TOTAL KNEE ARTHROPLASTY Right 07/23/2015   TOTAL KNEE ARTHROPLASTY Right 07/23/2015   Procedure: TOTAL KNEE ARTHROPLASTY;  Surgeon: Valeria Batman, MD;  Location: [facility] OR;  Service: Orthopedics;  Laterality: Right;   TOTAL KNEE ARTHROPLASTY Left 10/03/2019   Procedure: LEFT TOTAL KNEE ARTHROPLASTY;  Surgeon: Valeria Batman, MD;  Location: [facility];  Service: Orthopedics;  Laterality: Left;   UVULOPALATOPHARYNGOPLASTY (UPPP)/TONSILLECTOMY/SEPTOPLASTY  1990's   VASECTOMY     Patient Active Problem List   Diagnosis Date Noted   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 08/24/2023   Degenerative arthritis of metacarpophalangeal joint of  middle finger of right hand 08/24/2023   Polycythemia, secondary 09/01/2022   Hereditary hemochromatosis (HCC) 09/01/2022   Left carpal tunnel syndrome 07/07/2022   Acute thigh pain, right 05/01/2021   CMC (carpometacarpal joint) dislocation, left, initial encounter 11/12/2020   Post-operative state 08/20/2020   Nontraumatic incomplete tear of left rotator cuff 08/20/2020   AC (acromioclavicular) arthritis 08/20/2020   Complete tear of left rotator cuff 07/09/2020   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 05/07/2020   Osteoarthritis of left AC (acromioclavicular) joint 05/07/2020    Impingement syndrome of left shoulder 02/08/2020   S/P TKR (total knee replacement) using cement, left 10/03/2019   Cellulitis of arm 08/28/2019   Essential hypertension 02/16/2018   Sepsis due to skin infection (HCC) 02/16/2018   Chronic pain 02/16/2018   Cellulitis of left lower extremity    Lumbar radiculopathy, chronic 01/24/2018   Lumbar stenosis with neurogenic claudication 03/23/2017   Hypothyroidism 07/25/2015   Primary osteoarthritis of right knee 07/23/2015   Primary osteoarthritis of knee 07/23/2015    ONSET DATE: DOS: 08/24/23   REFERRING DIAG: M19.041 (ICD-10-CM) - 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  He has had a thumb arthroplasty in the left hand in the past, and decided to go through MCP joint arthroplasty in the right hand.  He is not having significant pain today just postsurgical soreness and swelling.  He does have a lot of stiffness and states that he cannot do many of his normal daily activities nor should he be this soon after surgery.  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  PRECAUTIONS: None;  RED FLAGS:  None   WEIGHT BEARING RESTRICTIONS: Yes NWB Rt hand now    SUBJECTIVE:   SUBJECTIVE STATEMENT: He is now 3+ weeks s/p MCP J arthoplasties in Rt hand digits 2,3.  He states new dynamic orthosis is working well and that he wears it many times in the day though he may not be wearing it for the whole 25 to 30 minutes that were recommended.  He also states not wearing his day  brace much at all now, not having much pain, using his hand frequently for things like using the mouse on his computer.    PAIN:  Are you having pain? Yes: NPRS scale: 0/10 Pain location: Rt hand sx area (MCPJs) Pain description: discomfort Aggravating factors: Moving and attempted weightbearing Relieving factors: Rest and ice   PATIENT GOALS: To improve the use of his right dominant hand safely  NEXT MD VISIT: 10/04/2023   OBJECTIVE: (All objective assessments below are from initial evaluation on: 09/06/23 unless otherwise specified.)   HAND DOMINANCE: Right   ADLs: Overall ADLs: States decreased ability to grab, hold household objects, pain and difficulty to open containers, perform FMS tasks (manipulate fasteners on clothing), mild to moderate bathing problems as well.    FUNCTIONAL OUTCOME MEASURES: Eval: Quick DASH 66% impairment today  (Higher % Score  =  More Impairment)    UPPER EXTREMITY ROM     Shoulder to Wrist AROM Right  eval Rt 09/09/23 Rt 09/16/23  Wrist flexion 20 55 56  Wrist extension 21 45 58  (Blank rows = not tested)   Hand AROM Right eval Rt 09/09/23 Rt 09/14/23 Rt 09/16/23  Full Fist Ability (or Gap to Distal Palmar Crease) Unable  >6cm gap from fingertips to palm  3.4cm gap from finger tips to [facility]    Thumb Opposition  (Kapandji Scale)  4/10  6/10   Thumb MCP (0-60)      Thumb IP (0-80)      Thumb Radial Abduction Span      Thumb Palmar Abduction Span      Index MCP (0-90) (-17) - 25  (-16) - 35 (-14)  - 37 (-5)  - 40  Index PIP (0-100) (-22) - 37  (-14) - 84 (-7) - 94 (-5) - 103  Index DIP (0-70) 0- 12  0 - 32 0 - 54 0 - 47  Long MCP (0-90) ( -10) - 30   (-10)  - 40 (-11) - 50 (-5) - 46  Long PIP (0-100) ( -26) - 55  (-25)  - 88 (-28) - 97 (-20) - 96  Long DIP (0-70) 0-  22  0 - 35 0 - 48 0 - 54  Ring MCP (0-90)       Ring PIP (0-100)       Ring DIP (0-70)       Little MCP (0-90)       Little PIP (0-100)       Little DIP (0-70)        (Blank rows = not tested)   UPPER EXTREMITY MMT:    Eval:  NT at eval due to recent and still healing injuries. Will be tested when appropriate.   MMT Right TBD Left TBD  Shoulder flexion    Shoulder abduction    Shoulder adduction    Shoulder extension    Shoulder internal rotation    Shoulder external rotation    Middle trapezius    Lower trapezius    Elbow flexion    Elbow extension    Forearm supination    Forearm pronation    Wrist flexion    Wrist extension    Wrist ulnar deviation    Wrist radial deviation    (Blank rows = not tested)  HAND FUNCTION: Eval: Observed weakness in affected right hand.  Details will be tested when safe Grip strength Right: TBD lbs, Left: TBD lbs   COORDINATION: 09/09/23:  9 Hole Peg Test Right: 31 sec (30 sec is WFL, 22sec is AVG)   Eval: Observed coordination impairments with affected right hand.  Details will be tested when able   SENSATION: Eval:  Light touch intact today, though diminished around sx area    EDEMA:   09/14/23: 21.6cm  swelling circumferentially about Rt MCP Js today  09/09/23: 22.8cm swelling circumferentially about Rt MCP Js today (21.2cm in Lt hand)  OBSERVATIONS:   Eval: No overt signs of infection, not overly tender to palpation.  Steri-Strips in place and clean.  Skin dry and flaking.  Presents with a lot of stiffness through the fingers and hand altogether   TODAY'S TREATMENT:  09/16/23: He performs active range of motion for exercise as well as new measures that shows some small improvements at the index finger MCP joint but not on the ring finger.  OT is suspicious that he was not wearing his orthosis for long enough and reiterates this.  OT is also/suspicious  that he is not performing consistent stretching and instead is preferring to "do his own thing" and use the computer and other light motions.  OT emphasizes that he must be doing endrange stretches with tension and moving to tension points with  prescribed exercises, not just doing functional activities lightly.  His entire home exercise program is reviewed with him today and adjusted to add wrist stretches now and composite finger flexion stretches.  He has no pain or problem with these and states feeling a good stretch when performing them after OT explains and demonstrates them.  He asks about if he can start using a hand gripper and he is advised to not do this until 8 weeks postop which will be about January 21.  He was supplied with buddy straps today and shown how to use them and wear them during the day when not wearing his other brace to assist flexion at the MCP and perhaps even DIP joints.  He states understanding all directions at the end of the session and leaves in no pain  Exercises - Wrist Flexion Stretch  - 4 x daily - 3-5 reps - 15 sec hold - Wrist Prayer Stretch  - 4 x daily - 3-5 reps - 15 sec hold - BACK KNUCKLE STRETCHES   - 4 x daily - 3-5 reps - 15 sec hold - HOOK Stretch  - 4 x daily - 3-5 reps - 15-20 sec hold - Seated Finger Composite Flexion Stretch  - 4 x daily - 3-5 reps - 15 hold - PUSH KNUCKLES DOWN  - 4 x daily - 3-5 reps - 15 seconds hold - Thumb Opposition  - 4-6 x daily - 10 reps - Hand AROM Reverse Blocking  - 4-6 x daily - 10-15 reps - Tendon Glides  - 4-6 x daily - 3-5 reps - 2-3 seconds hold   PATIENT EDUCATION: Education details: See tx section above for details  Person educated: Patient Education method: Verbal Instruction, Teach back, Handouts  Education comprehension: States and demonstrates understanding, Additional Education required    HOME EXERCISE PROGRAM: Access Code: [email or web address] URL: [email or web address] Date: 09/06/2023 Prepared by: Fannie Knee   GOALS: Goals reviewed with patient? Yes   SHORT TERM GOALS: (STG required if POC>30 days) Target Date: 09/17/23  Pt will obtain protective, custom orthotic. Goal status: 09/06/23: MET  2.  Pt will demo/state  understanding of initial HEP to improve pain levels and prerequisite motion. Goal status: 09/14/2023: Met   LONG TERM GOALS: Target Date: 11/05/23  Pt will improve functional ability by decreased impairment per Quick DASH assessment from 66% to 15% or better, for better quality of life. Goal status: INITIAL  2.  Pt will improve grip strength in Rt hand to at least 30lbs for functional use at home and in IADLs. Goal status: INITIAL  3.  Pt will improve A/ROM in Rt IF, MF TAM to at least 200* each, to have functional motion for tasks like reach and grasp.  Goal status: INITIAL  4.  Pt will improve strength in Rt wrist flex/ext to at least 4+/5 MMT to have increased functional ability to carry out selfcare and higher-level homecare tasks with less difficulty. Goal status: INITIAL  5.  Pt will improve coordination skills in Rt dom hand, as seen by [facility] score on nine-hole peg testing to have increased functional ability to carry out fine motor tasks (fasteners, etc.) and more complex, coordinated IADLs (meal prep, sports, etc.).  Goal status: INITIAL  6.  Pt will decrease pain at rest  from 1-2/10 to 0/10 to have better sleep and occupational participation in daily roles. Goal status: INITIAL   ASSESSMENT:  CLINICAL IMPRESSION: 09/16/23: He has little to no pain typically which is good but does admit mowing the lawn and having high pain at home sometimes and not wearing his brace and perhaps not being quite compliant with exercises or the length of time in the dynamic orthosis.  Due to all the small factors adding up it seems like he is staying a bit stiff, however he is only just over 3 weeks postop now.  His swelling is still significant and that is also likely holding him back significantly.  Carry on    PLAN:  OT FREQUENCY: 1-2x/week  OT DURATION: 8 weeks through 11/05/2023 and up to 14 total visits as needed  PLANNED INTERVENTIONS: 97168 OT Re-evaluation, 97535 self care/ADL  training, 16109 therapeutic exercise, 97530 therapeutic activity, 97112 neuromuscular re-education, 97140 manual therapy, 97035 ultrasound, 97039 fluidotherapy, 97010 moist heat, 97010 cryotherapy, 97034 contrast bath, 97760 Orthotics management and training, 60454 Splinting (initial encounter), [id number] Subsequent splinting/medication, scar mobilization, passive range of motion, compression bandaging, Dry needling, coping strategies training, and patient/family education   CONSULTED AND AGREED WITH PLAN OF CARE: Patient  PLAN FOR NEXT SESSION:   Check on full HEP and exercise program as well as use of orthotics and techniques to improve motion and ability.  Fannie Knee, OTR/L, CHT 09/17/2023, 11:56 AM

## 2023-09-23 ENCOUNTER — Ambulatory Visit: Payer: Medicare Other | Admitting: Hematology and Oncology

## 2023-09-23 ENCOUNTER — Other Ambulatory Visit: Payer: Medicare Other

## 2023-09-24 ENCOUNTER — Inpatient Hospital Stay: Payer: Medicare Other | Attending: Internal Medicine

## 2023-09-24 VITALS — BP 174/80 | HR 65 | Resp 16

## 2023-09-24 DIAGNOSIS — D751 Secondary polycythemia: Secondary | ICD-10-CM

## 2023-09-24 NOTE — Patient Instructions (Signed)

## 2023-09-24 NOTE — Progress Notes (Signed)
Larry [name] presents today for phlebotomy per MD orders. Phlebotomy procedure started at 1509 and ended at 1513. 16g Phlebotomy kit used to RAC. 530 grams removed. Patient tolerated procedure well. Beverage and snack provided. IV needle removed intact. Pt ambulated to lobby in stable condition.

## 2023-09-27 NOTE — Therapy (Signed)
 OUTPATIENT OCCUPATIONAL THERAPY TREATMENT NOTE  Patient Name: Larry [name] MRN: [id number] DOB:1951-04-17, 72 y.o., male Today's Date: 09/28/2023  PCP: Verdia Lombard, MD REFERRING PROVIDER: Arlinda Buster, MD   END OF SESSION:  OT End of Session - 09/28/23 1519     Visit Number 5    Number of Visits 14    Date for OT Re-Evaluation 11/05/23    Authorization Type BCBS Medicare    OT Start Time 1519    OT Stop Time 1609    OT Time Calculation (min) 50 min    Equipment Utilized During Treatment --    Activity Tolerance Patient tolerated treatment well;No increased pain    Behavior During Therapy WFL for tasks assessed/performed                 Past Medical History:  Diagnosis Date   Arthritis    knees; left shoulder (04/12/2013)   Asthma    as a child (04/12/2013)   DDD (degenerative disc disease), cervical    DDD (degenerative disc disease), lumbar    Difficult intubation    2012   CERVICAL FUSION    Headache(784.0)    VERAPAMIL  FOR PREVENTION    Hemochromatosis    defective gene (04/12/2013); 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 ago, just gets jittery   Hypothyroidism    Lumbar stenosis    MRSA (methicillin resistant staph aureus) culture positive 09/10/2019   upper arm   OSA (obstructive sleep apnea)    haven't wore mask in years; had OR; still snore (04/12/2013)   Urinary frequency    Past Surgical History:  Procedure Laterality Date   BACK SURGERY     CARPOMETACARPEL SUSPENSION PLASTY Left 07/19/2020   Procedure: LEFT THUMB CARPOMETACARPAL (CMC) ARTHROPLASTY;  Surgeon: Jerri Kay HERO, MD;  Location: [facility];  Service: Orthopedics;  Laterality: Left;   CERVICAL FUSION  2002; 2012   CYSTECTOMY     FINGER ARTHROPLASTY Right 08/24/2023   Procedure: RIGHT INDEX/ LONG FINGER  METACARPOPHANGEAL JOINT ARTHROPLASTY;  Surgeon: Arlinda Buster, MD;  Location: [facility];  Service: Orthopedics;  Laterality: Right;  Regional block   INGUINAL HERNIA REPAIR Bilateral 1997   KNEE ARTHROSCOPY Left ~ 1997; ~ 2005   LUMBAR FUSION  2007; 04/11/2013   MICROLARYNGOSCOPY N/A 03/07/2021   Procedure: DIRECT MICROLARYNGOSCOPY WITH BIOPSY;  Surgeon: Ethyl Lonni BRAVO, MD;  Location: [facility];  Service: ENT;  Laterality: N/A;   PENILE DEBRIDEMENT  ~ 2011   X 3  FOR MRSA INFECTION     SHOULDER ARTHROSCOPY Left ~ 2003   TONSILLECTOMY  1990's   TOTAL KNEE ARTHROPLASTY Right 07/23/2015   TOTAL KNEE ARTHROPLASTY Right 07/23/2015   Procedure: TOTAL KNEE ARTHROPLASTY;  Surgeon: Maude LELON Right, MD;  Location: [facility] OR;  Service: Orthopedics;  Laterality: Right;   TOTAL KNEE ARTHROPLASTY Left 10/03/2019   Procedure: LEFT TOTAL KNEE ARTHROPLASTY;  Surgeon: Right Maude LELON, MD;  Location: [facility];  Service: Orthopedics;  Laterality: Left;   UVULOPALATOPHARYNGOPLASTY (UPPP)/TONSILLECTOMY/SEPTOPLASTY  1990's   VASECTOMY     Patient Active Problem List   Diagnosis Date Noted   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 08/24/2023   Degenerative arthritis of metacarpophalangeal joint of middle finger of  right hand 08/24/2023   Polycythemia, secondary 09/01/2022   Hereditary hemochromatosis (HCC) 09/01/2022   Left carpal tunnel syndrome 07/07/2022   Acute thigh pain, right 05/01/2021   CMC (carpometacarpal joint) dislocation, left, initial encounter 11/12/2020   Post-operative state 08/20/2020   Nontraumatic incomplete tear of left rotator cuff 08/20/2020   AC (acromioclavicular) arthritis 08/20/2020   Complete tear of left rotator cuff 07/09/2020   Osteoarthritis of first carpometacarpal [facility]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 05/07/2020   Osteoarthritis of left AC (acromioclavicular) joint 05/07/2020   Impingement syndrome of left  shoulder 02/08/2020   S/P TKR (total knee replacement) using cement, left 10/03/2019   Cellulitis of arm 08/28/2019   Essential hypertension 02/16/2018   Sepsis due to skin infection (HCC) 02/16/2018   Chronic pain 02/16/2018   Cellulitis of left lower extremity    Lumbar radiculopathy, chronic 01/24/2018   Lumbar stenosis with neurogenic claudication 03/23/2017   Hypothyroidism 07/25/2015   Primary osteoarthritis of right knee 07/23/2015   Primary osteoarthritis of knee 07/23/2015    ONSET DATE: DOS: 08/24/23   REFERRING DIAG: M19.041 (ICD-10-CM) - 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  He has had a thumb arthroplasty in the left hand in the past, and decided to go through MCP joint arthroplasty in the right hand.  He is not having significant pain today just postsurgical soreness and swelling.  He does have a lot of stiffness and states that he cannot do many of his normal daily activities nor should he be this soon after surgery.  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  PRECAUTIONS: None;  RED FLAGS:  None   WEIGHT BEARING RESTRICTIONS: Yes NWB Rt hand now    SUBJECTIVE:   SUBJECTIVE STATEMENT: He is now 5 weeks s/p MCP J arthoplasties in Rt hand digits 2,3.  He states he had some bleeding at the MF where the sx was a little open, but it's stopped and well healed now. And has been exercising, nothing has been significantly painful   PAIN:  Are you having pain?  No NPRS scale: 0/10    PATIENT  GOALS: To improve the use of his right dominant hand safely  NEXT MD VISIT: 10/04/2023   OBJECTIVE: (All objective assessments below are from initial evaluation on: 09/06/23 unless otherwise specified.)   HAND DOMINANCE: Right   ADLs: Overall ADLs: States decreased ability to grab, hold household objects, pain and difficulty to open containers, perform FMS tasks (manipulate fasteners on clothing), mild to moderate bathing problems as well.    FUNCTIONAL OUTCOME MEASURES: Eval: Quick DASH 66% impairment today  (Higher % Score  =  More Impairment)    UPPER EXTREMITY ROM     Shoulder to Wrist AROM Right eval Rt 09/09/23 Rt 09/16/23 Rt 09/28/23  Wrist flexion 20 55 56 55  Wrist extension 21 45 58 60  (Blank rows = not tested)   Hand AROM Right eval Rt 09/09/23 Rt 09/14/23 Rt 09/16/23 Rt 09/28/23  Full Fist Ability (or Gap to Distal Palmar Crease) Unable  >6cm gap from  fingertips to palm  3.4cm gap from finger tips to [facility]   No gap, loose full fist  Thumb Opposition  (Kapandji Scale)  4/10  6/10    Thumb MCP (0-60)       Thumb IP (0-80)       Thumb Radial Abduction Span       Thumb Palmar Abduction Span       Index MCP (0-90) (-17) - 25  (-16) - 35 (-14)  - 37 (-5)  - 40 0 - 49  Index PIP (0-100) (-22) - 37  (-14) - 84 (-7) - 94 (-5) - 103 (-7) - 112  Index DIP (0-70) 0- 12  0 - 32 0 - 54 0 - 47 0 - 48  Long MCP (0-90) ( -10) - 30   (-10)  - 40 (-11) - 50 (-5) - 46 0 - 55  Long PIP (0-100) ( -26) - 55  (-25)  - 88 (-28) - 97 (-20) - 96 (-16) - 98  Long DIP (0-70) 0-  22  0 - 35 0 - 48 0 - 54 0 - 48  Ring MCP (0-90)        Ring PIP (0-100)        Ring DIP (0-70)        Little MCP (0-90)        Little PIP (0-100)        Little DIP (0-70)        (Blank rows = not tested)   UPPER EXTREMITY MMT:    Eval:  NT at eval due to recent and still healing injuries. Will be tested when appropriate.   MMT Right TBD Left TBD  Shoulder flexion    Shoulder abduction    Shoulder  adduction    Shoulder extension    Shoulder internal rotation    Shoulder external rotation    Middle trapezius    Lower trapezius    Elbow flexion    Elbow extension    Forearm supination    Forearm pronation    Wrist flexion    Wrist extension    Wrist ulnar deviation    Wrist radial deviation    (Blank rows = not tested)  HAND FUNCTION: Eval: Observed weakness in affected right hand.  Details will be tested when safe Grip strength Right: TBD lbs, Left: TBD lbs   COORDINATION: 09/09/23:  9 Hole Peg Test Right: 31 sec (30 sec is WFL, 22sec is AVG)   Eval: Observed coordination impairments with affected right hand.  Details will be tested when able   SENSATION: Eval:  Light touch intact today, though diminished around sx area    EDEMA:   09/14/23: 21.6cm  swelling circumferentially about Rt MCP Js today  09/09/23: 22.8cm swelling circumferentially about Rt MCP Js today (21.2cm in Lt hand)  OBSERVATIONS:   Eval: No overt signs of infection, not overly tender to palpation.  Steri-Strips in place and clean.  Skin dry and flaking.  Presents with a lot of stiffness through the fingers and hand altogether   TODAY'S TREATMENT:  09/28/23: He starts with active range of motion for exercise as well as new measures which shows increased progress for total active motion of digits 2 and 3 of the right hand.  For self-care, OT applies a lotion to the dry surgical area and advises him to keep it moisturized.  OT then does manual therapy stretches for the MCP joint as well as IP joints and  then composite flexion reviewing exercises as we go.  Additionally he continues to complain about a triggering index finger and OT explains this problem again giving the following advice as listed below:   Continue stretches to wrist and fingers, continue wearing "stretching brace" 2 x day for 30 mins (most days).     Light activities are great (mouse, writing, typing, etc), and you don't need to wear  day-time brace, unless you have any pains.   Wear buddy straps to help your fingers move.  Avoid anything painful or weighing more than 8-10lbs.   No hand strengthening until you return to therapy in 2 weeks!   For "Trigger Finger" avoid the "clicking!!!"   Try to limit repetitive motion.  You can stretch your finger in extension gently, and massage the base of your finger.      (Normally I would "hold your finger back" with a band-aide to prevent it, but I need you to get moving after this surgery.)   His home exercise was reviewed in its entirety as listed below and he is doing well with his new stretches.  Exercises reviewed today: - Wrist Flexion Stretch  - 4 x daily - 3-5 reps - 15 sec hold - Wrist Prayer Stretch  - 4 x daily - 3-5 reps - 15 sec hold - BACK KNUCKLE STRETCHES   - 4 x daily - 3-5 reps - 15 sec hold - HOOK Stretch  - 4 x daily - 3-5 reps - 15-20 sec hold - Seated Finger Composite Flexion Stretch  - 4 x daily - 3-5 reps - 15 hold - PUSH KNUCKLES DOWN  - 4 x daily - 3-5 reps - 15 seconds hold - Thumb Opposition  - 4-6 x daily - 10 reps - Hand AROM Reverse Blocking  - 4-6 x daily - 10-15 reps - Tendon Glides  - 4-6 x daily - 3-5 reps - 2-3 seconds hold    He leaves today in no significant pain stating understanding that we will skip the next 2 sessions and I will see him back in 2 weeks to learn new hand strengthening activities as tolerated.  Again he was reminded to not lift push pull anything that is heavy or painful during this time away from therapy-and he can come in sooner if he is having any issues.  PATIENT EDUCATION: Education details: See tx section above for details  Person educated: Patient Education method: Verbal Instruction, Teach back, Handouts  Education comprehension: States and demonstrates understanding, Additional Education required    HOME EXERCISE PROGRAM: Access Code: [email or web address] URL: [email or web address] Date: 09/06/2023 Prepared  by: Melvenia Ada   GOALS: Goals reviewed with patient? Yes   SHORT TERM GOALS: (STG required if POC>30 days) Target Date: 09/17/23  Pt will obtain protective, custom orthotic. Goal status: 09/06/23: MET  2.  Pt will demo/state understanding of initial HEP to improve pain levels and prerequisite motion. Goal status: 09/14/2023: Met   LONG TERM GOALS: Target Date: 11/05/23  Pt will improve functional ability by decreased impairment per Quick DASH assessment from 66% to 15% or better, for better quality of life. Goal status: INITIAL  2.  Pt will improve grip strength in Rt hand to at least 30lbs for functional use at home and in IADLs. Goal status: INITIAL  3.  Pt will improve A/ROM in Rt IF, MF TAM to at least 200* each, to have functional motion for tasks like reach and grasp.  Goal status: INITIAL  4.  Pt  will improve strength in Rt wrist flex/ext to at least 4+/5 MMT to have increased functional ability to carry out selfcare and higher-level homecare tasks with less difficulty. Goal status: INITIAL  5.  Pt will improve coordination skills in Rt dom hand, as seen by [facility] score on nine-hole peg testing to have increased functional ability to carry out fine motor tasks (fasteners, etc.) and more complex, coordinated IADLs (meal prep, sports, etc.).  Goal status: INITIAL  6.  Pt will decrease pain at rest  from 1-2/10 to 0/10 to have better sleep and occupational participation in daily roles. Goal status: INITIAL   ASSESSMENT:  CLINICAL IMPRESSION: 09/28/23: He is doing excellent and is actually ahead of normal postoperative time frames, so OT will give him grace and allow him to carefully skip next weeks' appointment.  I will see him back in 2 weeks at which time we will try to start light hand strengthening, if well-tolerated   PLAN:  OT FREQUENCY: 1-2x/week  OT DURATION: 8 weeks through 11/05/2023 and up to 14 total visits as needed  PLANNED INTERVENTIONS: 97168 OT  Re-evaluation, 97535 self care/ADL training, 02889 therapeutic exercise, 97530 therapeutic activity, 97112 neuromuscular re-education, 97140 manual therapy, 97035 ultrasound, 97039 fluidotherapy, 97010 moist heat, 97010 cryotherapy, 97034 contrast bath, 97760 Orthotics management and training, 02239 Splinting (initial encounter), [id number] Subsequent splinting/medication, scar mobilization, passive range of motion, compression bandaging, Dry needling, coping strategies training, and patient/family education   CONSULTED AND AGREED WITH PLAN OF CARE: Patient  PLAN FOR NEXT SESSION:   Check status and motion and start light hand strengthening about 7 weeks postop as tolerated  Melvenia Ada, OTR/L, CHT 09/28/2023, 4:59 PM

## 2023-09-28 ENCOUNTER — Encounter: Payer: Self-pay | Admitting: Rehabilitative and Restorative Service Providers"

## 2023-09-28 ENCOUNTER — Ambulatory Visit (INDEPENDENT_AMBULATORY_CARE_PROVIDER_SITE_OTHER): Payer: Medicare Other | Admitting: Rehabilitative and Restorative Service Providers"

## 2023-09-28 DIAGNOSIS — R278 Other lack of coordination: Secondary | ICD-10-CM | POA: Diagnosis not present

## 2023-09-28 DIAGNOSIS — M25641 Stiffness of right hand, not elsewhere classified: Secondary | ICD-10-CM

## 2023-09-28 DIAGNOSIS — R600 Localized edema: Secondary | ICD-10-CM | POA: Diagnosis not present

## 2023-09-28 DIAGNOSIS — M6281 Muscle weakness (generalized): Secondary | ICD-10-CM

## 2023-09-28 DIAGNOSIS — M79641 Pain in right hand: Secondary | ICD-10-CM | POA: Diagnosis not present

## 2023-09-30 ENCOUNTER — Encounter: Payer: Medicare Other | Admitting: Rehabilitative and Restorative Service Providers"

## 2023-10-04 ENCOUNTER — Ambulatory Visit: Payer: Medicare Other | Admitting: Orthopedic Surgery

## 2023-10-04 DIAGNOSIS — M19041 Primary osteoarthritis, right hand: Secondary | ICD-10-CM

## 2023-10-04 NOTE — Progress Notes (Signed)
   FRANS VALENTE - 73 y.o. male MRN [id number]  Date of birth: 07/19/1951  Office Visit Note: Visit Date: 10/04/2023 PCP: Verdia Lombard, MD Referred by: Verdia Lombard, MD  Subjective:  HPI: THAINE GARRIGA is a 73 y.o. male who presents today for follow up 6 weeks status post right index/long finger metacarpophangeal joint arthroplasty.  He is doing very well overall, range of motion has progressed very well.  He is working with occupational therapy as instructed.  Pertinent ROS were reviewed with the patient and found to be negative unless otherwise specified above in HPI.   Assessment & Plan: Visit Diagnoses: No diagnosis found.  Plan: He continues to do excellent postoperatively.  Continue with occupational therapy exercises for range of motion, progressive strengthening at week 8 per protocol.  Follow-up with myself in 6 weeks.  Follow-up: No follow-ups on file.   Meds & Orders: No orders of the defined types were placed in this encounter.  No orders of the defined types were placed in this encounter.    Procedures: No procedures performed       Objective:   Vital Signs: There were no vitals taken for this visit.  Ortho Exam Right hand: - Well-healed dorsal incision over the index and long finger MCP, no erythema or drainage - Near composite fist without significant restriction - No instability at the index or long finger with stress testing - Hand is warm well-perfused, sensation intact diffusely  Imaging: No results found.   Rosemary Pentecost Afton Alderton, M.D. [facility] 3:30 PM

## 2023-10-06 ENCOUNTER — Encounter: Payer: Medicare Other | Admitting: Rehabilitative and Restorative Service Providers"

## 2023-10-13 ENCOUNTER — Ambulatory Visit (INDEPENDENT_AMBULATORY_CARE_PROVIDER_SITE_OTHER): Payer: Medicare Other | Admitting: Rehabilitative and Restorative Service Providers"

## 2023-10-13 ENCOUNTER — Encounter: Payer: Self-pay | Admitting: Rehabilitative and Restorative Service Providers"

## 2023-10-13 DIAGNOSIS — M6281 Muscle weakness (generalized): Secondary | ICD-10-CM

## 2023-10-13 DIAGNOSIS — R600 Localized edema: Secondary | ICD-10-CM

## 2023-10-13 DIAGNOSIS — M79641 Pain in right hand: Secondary | ICD-10-CM

## 2023-10-13 DIAGNOSIS — M25641 Stiffness of right hand, not elsewhere classified: Secondary | ICD-10-CM

## 2023-10-13 DIAGNOSIS — R278 Other lack of coordination: Secondary | ICD-10-CM | POA: Diagnosis not present

## 2023-10-13 NOTE — Therapy (Signed)
 OUTPATIENT OCCUPATIONAL THERAPY TREATMENT NOTE  Patient Name: Larry [name] MRN: [id number] DOB:1951/02/29, 73 y.o., male Today's Date: 10/13/2023  PCP: Virgle Grime, MD REFERRING PROVIDER: Merrill Abide, MD   END OF SESSION:  OT End of Session - 10/13/23 1520     Visit Number 6    Number of Visits 14    Date for OT Re-Evaluation 11/05/23    Authorization Type [employer] Medicare    OT Start Time 1520    OT Stop Time 1551    OT Time Calculation (min) 31 min    Equipment Utilized During Treatment pink putty    Activity Tolerance Patient tolerated treatment well;No increased pain    Behavior During Therapy WFL for tasks assessed/performed                  Past Medical History:  Diagnosis Date   Arthritis    "knees; left shoulder" (04/12/2013)   Asthma    "as a child" (04/12/2013)   DDD (degenerative disc disease), cervical    DDD (degenerative disc disease), lumbar    Difficult intubation    2012   CERVICAL FUSION    Headache(784.0)    VERAPAMIL  FOR PREVENTION    Hemochromatosis    "defective gene" (04/12/2013); 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 ago, just gets jittery   Hypothyroidism    Lumbar stenosis    MRSA (methicillin resistant staph aureus) culture positive 09/10/2019   upper arm   OSA (obstructive sleep apnea)    "haven't wore mask in years; had OR; still snore" (04/12/2013)   Urinary frequency    Past Surgical History:  Procedure Laterality Date   BACK SURGERY     CARPOMETACARPEL SUSPENSION PLASTY Left 07/19/2020   Procedure: LEFT THUMB CARPOMETACARPAL (CMC) ARTHROPLASTY;  Surgeon: Wes Hamman, MD;  Location: [facility];  Service: Orthopedics;  Laterality: Left;   CERVICAL FUSION  2002; 2012   CYSTECTOMY     FINGER ARTHROPLASTY Right 08/24/2023   Procedure: RIGHT INDEX/ LONG FINGER  METACARPOPHANGEAL JOINT ARTHROPLASTY;  Surgeon: Merrill Abide, MD;  Location: [facility];  Service: Orthopedics;  Laterality: Right;  Regional block   INGUINAL HERNIA REPAIR Bilateral 1997   KNEE ARTHROSCOPY Left ~ 1997; ~ 2005   LUMBAR FUSION  2007; 04/11/2013   MICROLARYNGOSCOPY N/A 03/07/2021   Procedure: DIRECT MICROLARYNGOSCOPY WITH BIOPSY;  Surgeon: Prescott Brodie, MD;  Location: [facility];  Service: ENT;  Laterality: N/A;   PENILE DEBRIDEMENT  ~ 2011   X 3  FOR MRSA INFECTION     SHOULDER ARTHROSCOPY Left ~ 2003   TONSILLECTOMY  1990's   TOTAL KNEE ARTHROPLASTY Right 07/23/2015   TOTAL KNEE ARTHROPLASTY Right 07/23/2015   Procedure: TOTAL KNEE ARTHROPLASTY;  Surgeon: Shirlee Dotter, MD;  Location: [facility] OR;  Service: Orthopedics;  Laterality: Right;   TOTAL KNEE ARTHROPLASTY Left 10/03/2019   Procedure: LEFT TOTAL KNEE ARTHROPLASTY;  Surgeon: Shirlee Dotter, MD;  Location: [facility];  Service: Orthopedics;  Laterality: Left;   UVULOPALATOPHARYNGOPLASTY (UPPP)/TONSILLECTOMY/SEPTOPLASTY  1990's   VASECTOMY     Patient Active Problem List   Diagnosis Date Noted   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 08/24/2023   Degenerative arthritis of metacarpophalangeal joint of middle  finger of right hand 08/24/2023   Polycythemia, secondary 09/01/2022   Hereditary hemochromatosis (HCC) 09/01/2022   Left carpal tunnel syndrome 07/07/2022   Acute thigh pain, right 05/01/2021   CMC (carpometacarpal joint) dislocation, left, initial encounter 11/12/2020   Post-operative state 08/20/2020   Nontraumatic incomplete tear of left rotator cuff 08/20/2020   AC (acromioclavicular) arthritis 08/20/2020   Complete tear of left rotator cuff 07/09/2020   Osteoarthritis of first carpometacarpal [facility]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 05/07/2020   Osteoarthritis of left AC (acromioclavicular) joint 05/07/2020   Impingement syndrome of left  shoulder 02/08/2020   S/P TKR (total knee replacement) using cement, left 10/03/2019   Cellulitis of arm 08/28/2019   Essential hypertension 02/16/2018   Sepsis due to skin infection (HCC) 02/16/2018   Chronic pain 02/16/2018   Cellulitis of left lower extremity    Lumbar radiculopathy, chronic 01/24/2018   Lumbar stenosis with neurogenic claudication 03/23/2017   Hypothyroidism 07/25/2015   Primary osteoarthritis of right knee 07/23/2015   Primary osteoarthritis of knee 07/23/2015    ONSET DATE: DOS: 08/24/23   REFERRING DIAG: M19.041 (ICD-10-CM) - 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  He has had a thumb arthroplasty in the left hand in the past, and decided to go through MCP joint arthroplasty in the right hand.  He is not having significant pain today just postsurgical soreness and swelling.  He does have a lot of stiffness and states that he cannot do many of his normal daily activities nor should he be this soon after surgery.  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  PRECAUTIONS: None;  RED FLAGS:  None   WEIGHT BEARING RESTRICTIONS: Yes NWB Rt hand now    SUBJECTIVE:   SUBJECTIVE STATEMENT: He is now 7 weeks s/p MCP J arthoplasties in Rt hand digits 2,3.  He states doing well himself over the past 2 weeks.  No pain or problems.  Doing high-level activities like shoveling snow and weightbearing, lifting up gallons of water , etc.  Some of the things were advised against but fortunately he did not  harm and he had no ill consequences after this.   PAIN:  Are you having pain?  No NPRS scale:  0/10    PATIENT GOALS: To improve the use of his right dominant hand safely  NEXT MD VISIT: 10/04/2023   OBJECTIVE: (All objective assessments below are from initial evaluation on: 09/06/23 unless otherwise specified.)   HAND DOMINANCE: Right   ADLs: Overall ADLs: States decreased ability to grab, hold household objects, pain and difficulty to open containers, perform FMS tasks (manipulate fasteners on clothing), mild to moderate bathing problems as well.    FUNCTIONAL OUTCOME MEASURES: 10/13/23: Quick DASH 2% impairment today   Eval: Quick DASH 66% impairment today  (Higher % Score  =  More Impairment)    UPPER EXTREMITY ROM     Shoulder to Wrist AROM Right eval Rt 09/09/23 Rt 09/16/23 Rt 09/28/23 Rt 10/13/23  Wrist flexion 20 55 56 55 57  Wrist extension 21 45 58 60 54  (  Blank rows = not tested)   Hand AROM Right eval Rt 09/09/23 Rt 09/14/23 Rt 09/16/23 Rt 09/28/23 Rt 10/13/23  Full Fist Ability (or Gap to Distal Palmar Crease) Unable  >6cm gap from fingertips to palm  3.4cm gap from finger tips to [facility]   No gap, loose full fist   Thumb Opposition  (Kapandji Scale)  4/10  6/10     Thumb MCP (0-60)        Thumb IP (0-80)        Thumb Radial Abduction Span        Thumb Palmar Abduction Span        Index MCP (0-90) (-17) - 25  (-16) - 35 (-14)  - 37 (-5)  - 40 0 - 49 0 - 48  Index PIP (0-100) (-22) - 37  (-14) - 84 (-7) - 94 (-5) - 103 (-7) - 112 (-5) - 104  Index DIP (0-70) 0- 12  0 - 32 0 - 54 0 - 47 0 - 48 0 -56  Long MCP (0-90) ( -10) - 30   (-10)  - 40 (-11) - 50 (-5) - 46 0 - 55 0 -55  Long PIP (0-100) ( -26) - 55  (-25)  - 88 (-28) - 97 (-20) - 96 (-16) - 98 (-20) - 105  Long DIP (0-70) 0-  22  0 - 35 0 - 48 0 - 54 0 - 48 0- 50  Ring MCP (0-90)         Ring PIP (0-100)         Ring DIP (0-70)         Little MCP (0-90)         Little PIP (0-100)         Little  DIP (0-70)         (Blank rows = not tested)   UPPER EXTREMITY MMT:    Eval:  NT at eval due to recent and still healing injuries. Will be tested when appropriate.   MMT Right 10/13/23  Forearm supination 5/5  Forearm pronation 5/5  Wrist flexion 5/5  Wrist extension 5/5  Wrist ulnar deviation   Wrist radial deviation   (Blank rows = not tested)  HAND FUNCTION: 10/13/23:  Grip strength Right: 33 lbs, Left: 73 lbs    COORDINATION: 09/09/23:  9 Hole Peg Test Right: 31 sec (30 sec is WFL, 22sec is AVG)   Eval: Observed coordination impairments with affected right hand.  Details will be tested when able   SENSATION: Eval:  Light touch intact today, though diminished around sx area    EDEMA:   09/14/23: 21.6cm  swelling circumferentially about Rt MCP Js today  09/09/23: 22.8cm swelling circumferentially about Rt MCP Js today (21.2cm in Lt hand)  OBSERVATIONS:   Eval: No overt signs of infection, not overly tender to palpation.  Steri-Strips in place and clean.  Skin dry and flaking.  Presents with a lot of stiffness through the fingers and hand altogether   TODAY'S TREATMENT:  10/13/23: He discusses home and functional activities over the past 2 weeks and states no significant problems now with any home tasks.  He even states more aggressive activities like shoveling snow and that these things did not hurt him.  OT did advise him to try to avoid any painful or repetitive lifting now but it weeks 8, 9, 10 he can start to advance himself more aggressively.   He performs active range of  motion for new measures which shows that he is somewhat plateauing with only mild improvements.  OT reminds him to be wearing his dynamic orthosis to help improve MCP joint flexion and he states that he does wear it occasionally.   Next as he is doing so well and managing without pain, we get into the new light hand strengthening with therapy putty and the bolded ways below.  Each 1 was demonstrated  to him and explained and he performs back with no significant pain or problems.  He was told to do these things twice a day and skip a day if he is sore.  He should do them nonforcefully, slowly like an active stretch.  He states understanding and leaves without any pain.    Exercises - Wrist Flexion Stretch  - 4 x daily - 3-5 reps - 15 sec hold - Wrist Prayer Stretch  - 4 x daily - 3-5 reps - 15 sec hold - BACK KNUCKLE STRETCHES   - 4 x daily - 3-5 reps - 15 sec hold - HOOK Stretch  - 4 x daily - 3-5 reps - 15-20 sec hold - Seated Finger Composite Flexion Stretch  - 4 x daily - 3-5 reps - 15 hold - PUSH KNUCKLES DOWN  - 4 x daily - 3-5 reps - 15 seconds hold - Thumb Opposition  - 4-6 x daily - 10 reps - Hand AROM Reverse Blocking  - 4-6 x daily - 10-15 reps - Tendon Glides  - 4-6 x daily - 3-5 reps - 2-3 seconds hold - Full Fist  - 2-3 x daily - 5 reps - Seated Claw Fist with Putty  - 2-3 x daily - 5 reps - Finger Extension "Pizza!"   - 2-3 x daily - 5 reps - Thumb Opposition with Putty  - 2-3 x daily - 5 reps - Cutting Putty  - 2-3 x daily - 5 reps - Finger Spread  - 2-3 x daily - 5 reps  PATIENT EDUCATION: Education details: See tx section above for details  Person educated: Patient Education method: Verbal Instruction, Teach back, Handouts  Education comprehension: States and demonstrates understanding, Additional Education required    HOME EXERCISE PROGRAM: Access Code: [email or web address] URL: [email or web address] Date: 09/06/2023 Prepared by: Leartis Proud   GOALS: Goals reviewed with patient? Yes   SHORT TERM GOALS: (STG required if POC>30 days) Target Date: 09/17/23  Pt will obtain protective, custom orthotic. Goal status: 09/06/23: MET  2.  Pt will demo/state understanding of initial HEP to improve pain levels and prerequisite motion. Goal status: 09/14/2023: Met   LONG TERM GOALS: Target Date: 11/05/23  Pt will improve functional ability by decreased  impairment per Quick DASH assessment from 66% to 15% or better, for better quality of life. Goal status: 10/13/23: MET  2.  Pt will improve grip strength in Rt hand to at least 30lbs for functional use at home and in IADLs. Goal status: INITIAL  3.  Pt will improve A/ROM in Rt IF, MF TAM to at least 200* each, to have functional motion for tasks like reach and grasp.  Goal status: INITIAL  4.  Pt will improve strength in Rt wrist flex/ext to at least 4+/5 MMT to have increased functional ability to carry out selfcare and higher-level homecare tasks with less difficulty. Goal status: 10/13/23: MET  5.  Pt will improve coordination skills in Rt dom hand, as seen by [facility] score on nine-hole peg testing to have increased functional ability to  carry out fine motor tasks (fasteners, etc.) and more complex, coordinated IADLs (meal prep, sports, etc.).  Goal status: INITIAL  6.  Pt will decrease pain at rest  from 1-2/10 to 0/10 to have better sleep and occupational participation in daily roles. Goal status: 10/13/23: MET   ASSESSMENT:  CLINICAL IMPRESSION: 10/13/23: He had been doing so well that he was able to take a week off of therapy and now he returns still doing well and actually tolerating hand strength and higher level functional tasks with no problems now.  If he can significantly improve his grip strength by next week and still have no pain or problems we will consider discharge unless he has any further goals or problems that he needs help with in therapy.    PLAN:  OT FREQUENCY: 1-2x/week  OT DURATION: 8 weeks through 11/05/2023 and up to 14 total visits as needed  PLANNED INTERVENTIONS: 97168 OT Re-evaluation, 97535 self care/ADL training, 82956 therapeutic exercise, 97530 therapeutic activity, 97112 neuromuscular re-education, 97140 manual therapy, 97035 ultrasound, 97039 fluidotherapy, 97010 moist heat, 97010 cryotherapy, 97034 contrast bath, 97760 Orthotics management and training,  97760 Splinting (initial encounter), [id number] Subsequent splinting/medication, scar mobilization, passive range of motion, compression bandaging, Dry needling, coping strategies training, and patient/family education   CONSULTED AND AGREED WITH PLAN OF CARE: Patient  PLAN FOR NEXT SESSION:   Perform a progress note, check all goals for therapy and including new therapy putty gripping and pinching and give advice and recommendations for safety, consider discharge  Leartis Proud, OTR/L, CHT 10/13/2023, 4:18 PM

## 2023-10-19 NOTE — Therapy (Signed)
OUTPATIENT OCCUPATIONAL THERAPY TREATMENT NOTE  Patient Name: Larry [name] MRN: [id number] DOB:April 20, 1951, 73 y.o., male Today's Date: 10/19/2023  PCP: Georgianne Fick, MD REFERRING PROVIDER: Samuella Cota, MD   END OF SESSION:         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 ago, just gets jittery   Hypothyroidism    Lumbar stenosis    MRSA (methicillin resistant staph aureus) culture positive 09/10/2019   upper arm   OSA (obstructive sleep apnea)    "haven't wore mask in years; had OR; still snore" (04/12/2013)   Urinary frequency    Past Surgical History:  Procedure Laterality Date   BACK SURGERY     CARPOMETACARPEL SUSPENSION PLASTY Left 07/19/2020   Procedure: LEFT THUMB CARPOMETACARPAL (CMC) ARTHROPLASTY;  Surgeon: Tarry Kos, MD;  Location: [facility];  Service: Orthopedics;  Laterality: Left;   CERVICAL FUSION  2002; 2012   CYSTECTOMY     FINGER ARTHROPLASTY Right 08/24/2023   Procedure: RIGHT INDEX/ LONG FINGER METACARPOPHANGEAL JOINT ARTHROPLASTY;  Surgeon: Samuella Cota, MD;  Location: [facility];  Service: Orthopedics;  Laterality: Right;  Regional block   INGUINAL HERNIA REPAIR Bilateral 1997   KNEE ARTHROSCOPY Left ~ 1997; ~ 2005   LUMBAR FUSION  2007; 04/11/2013   MICROLARYNGOSCOPY N/A 03/07/2021   Procedure: DIRECT MICROLARYNGOSCOPY WITH BIOPSY;  Surgeon: Drema Halon, MD;  Location: [facility];  Service: ENT;  Laterality: N/A;   PENILE DEBRIDEMENT  ~ 2011   X 3  FOR MRSA INFECTION     SHOULDER ARTHROSCOPY Left ~ 2003   TONSILLECTOMY  1990's   TOTAL KNEE ARTHROPLASTY Right 07/23/2015   TOTAL KNEE ARTHROPLASTY Right 07/23/2015   Procedure: TOTAL KNEE ARTHROPLASTY;  Surgeon: Valeria Batman, MD;  Location: [facility];  Service: Orthopedics;  Laterality: Right;   TOTAL KNEE ARTHROPLASTY Left 10/03/2019   Procedure: LEFT TOTAL KNEE ARTHROPLASTY;  Surgeon: Valeria Batman, MD;  Location: [facility];  Service: Orthopedics;  Laterality: Left;   UVULOPALATOPHARYNGOPLASTY (UPPP)/TONSILLECTOMY/SEPTOPLASTY  1990's   VASECTOMY     Patient Active Problem List   Diagnosis Date Noted   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 08/24/2023   Degenerative arthritis of metacarpophalangeal joint of middle finger of right hand 08/24/2023   Polycythemia, secondary 09/01/2022   Hereditary hemochromatosis (HCC) 09/01/2022   Left carpal tunnel syndrome 07/07/2022   Acute thigh pain, right 05/01/2021   CMC (carpometacarpal joint) dislocation, left, initial encounter 11/12/2020   Post-operative state 08/20/2020   Nontraumatic incomplete tear of left rotator cuff 08/20/2020   AC (acromioclavicular) arthritis 08/20/2020   Complete tear of left rotator cuff 07/09/2020   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 05/07/2020   Osteoarthritis of left AC (acromioclavicular) joint 05/07/2020   Impingement syndrome of left shoulder 02/08/2020  S/P TKR (total knee replacement) using cement, left 10/03/2019   Cellulitis of arm 08/28/2019   Essential hypertension 02/16/2018   Sepsis due to skin infection (HCC) 02/16/2018   Chronic pain 02/16/2018   Cellulitis of left lower extremity    Lumbar radiculopathy, chronic 01/24/2018   Lumbar stenosis with neurogenic claudication 03/23/2017   Hypothyroidism 07/25/2015   Primary osteoarthritis of right  knee 07/23/2015   Primary osteoarthritis of knee 07/23/2015    ONSET DATE: DOS: 08/24/23   REFERRING DIAG: M19.041 (ICD-10-CM) - 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  Rationale for Evaluation and Treatment: Rehabilitation  PERTINENT HISTORY:  Right index/long finger metacarpophangeal joint arthroplasty  He states having pain for the past 6 to 8 months his right dominant hand MCP joints of index and middle fingers.  He has had a thumb arthroplasty in the left hand in the past, and decided to go through MCP joint arthroplasty in the right hand.  He is not having significant pain today just postsurgical soreness and swelling.  He does have a lot of stiffness and states that he cannot do many of his normal daily activities nor should he be this soon after surgery.  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  PRECAUTIONS: None;  RED FLAGS:  None   WEIGHT BEARING RESTRICTIONS: Yes NWB Rt hand now    SUBJECTIVE:   SUBJECTIVE STATEMENT: He is now 7 weeks s/p MCP J arthoplasties in Rt hand digits 2,3.  He states ***    doing well himself over the past 2 weeks.  No pain or problems.  Doing high-level activities like shoveling snow and weightbearing, lifting up gallons of water, etc.  Some of the things were advised against but fortunately he did not harm and he had no ill consequences after this.   PAIN:  Are you having pain?  No NPRS scale:  0/10    PATIENT GOALS: To improve the use of his right dominant hand safely  NEXT MD VISIT: 10/04/2023   OBJECTIVE: (All objective assessments below are from initial evaluation on: 09/06/23 unless otherwise specified.)   HAND DOMINANCE: Right   ADLs: Overall ADLs: States decreased ability to grab, hold household objects, pain and difficulty to open containers, perform FMS tasks (manipulate fasteners on clothing), mild to moderate bathing problems as  well.    FUNCTIONAL OUTCOME MEASURES: 10/13/23: Quick DASH 2% impairment today   Eval: Quick DASH 66% impairment today  (Higher % Score  =  More Impairment)    UPPER EXTREMITY ROM     Shoulder to Wrist AROM Right eval Rt 09/09/23 Rt 09/16/23 Rt 09/28/23 Rt 10/13/23  Wrist flexion 20 55 56 55 57  Wrist extension 21 45 58 60 54  (Blank rows = not tested)   Hand AROM Right eval Rt 09/09/23 Rt 09/14/23 Rt 09/16/23 Rt 09/28/23 Rt 10/13/23 Rt 10/21/23  Full Fist Ability (or Gap to Distal Palmar Crease) Unable  >6cm gap from fingertips to palm  3.4cm gap from finger tips to [facility]   No gap, loose full fist    Thumb Opposition  (Kapandji Scale)  4/10  6/10      Thumb MCP (0-60)         Thumb IP (0-80)         Thumb Radial Abduction Span         Thumb Palmar Abduction Span  Index MCP (0-90) (-17) - 25  (-16) - 35 (-14)  - 37 (-5)  - 40 0 - 49 0 - 48 0 - ***  Index PIP (0-100) (-22) - 37  (-14) - 84 (-7) - 94 (-5) - 103 (-7) - 112 (-5) - 104   Index DIP (0-70) 0- 12  0 - 32 0 - 54 0 - 47 0 - 48 0 -56   Long MCP (0-90) ( -10) - 30   (-10)  - 40 (-11) - 50 (-5) - 46 0 - 55 0 -55   Long PIP (0-100) ( -26) - 55  (-25)  - 88 (-28) - 97 (-20) - 96 (-16) - 98 (-20) - 105   Long DIP (0-70) 0-  22  0 - 35 0 - 48 0 - 54 0 - 48 0- 50   Ring MCP (0-90)          Ring PIP (0-100)          Ring DIP (0-70)          Little MCP (0-90)          Little PIP (0-100)          Little DIP (0-70)          (Blank rows = not tested)   UPPER EXTREMITY MMT:    Eval:  NT at eval due to recent and still healing injuries. Will be tested when appropriate.   MMT Right 10/13/23  Forearm supination 5/5  Forearm pronation 5/5  Wrist flexion 5/5  Wrist extension 5/5  Wrist ulnar deviation   Wrist radial deviation   (Blank rows = not tested)  HAND FUNCTION: 10/21/23:  Grip strength Right: *** lbs  10/13/23:  Grip strength Right: 33 lbs, Left: 73 lbs    COORDINATION: 09/09/23:  9 Hole Peg Test  Right: 31 sec (30 sec is WFL, 22sec is AVG)   Eval: Observed coordination impairments with affected right hand.  Details will be tested when able   SENSATION: Eval:  Light touch intact today, though diminished around sx area    EDEMA:   09/14/23: 21.6cm  swelling circumferentially about Rt MCP Js today  09/09/23: 22.8cm swelling circumferentially about Rt MCP Js today (21.2cm in Lt hand)  OBSERVATIONS:   Eval: No overt signs of infection, not overly tender to palpation.  Steri-Strips in place and clean.  Skin dry and flaking.  Presents with a lot of stiffness through the fingers and hand altogether   TODAY'S TREATMENT:  10/21/23: *** Perform a progress note, check all goals for therapy and including new therapy putty gripping and pinching and give advice and recommendations for safety, consider d/c  10/13/23: He discusses home and functional activities over the past 2 weeks and states no significant problems now with any home tasks.  He even states more aggressive activities like shoveling snow and that these things did not hurt him.  OT did advise him to try to avoid any painful or repetitive lifting now but it weeks 8, 9, 10 he can start to advance himself more aggressively.   He performs active range of motion for new measures which shows that he is somewhat plateauing with only mild improvements.  OT reminds him to be wearing his dynamic orthosis to help improve MCP joint flexion and he states that he does wear it occasionally.   Next as he is doing so well and managing without pain, we get into the new light hand  strengthening with therapy putty and the bolded ways below.  Each 1 was demonstrated to him and explained and he performs back with no significant pain or problems.  He was told to do these things twice a day and skip a day if he is sore.  He should do them nonforcefully, slowly like an active stretch.  He states understanding and leaves without any pain.    Exercises - Wrist  Flexion Stretch  - 4 x daily - 3-5 reps - 15 sec hold - Wrist Prayer Stretch  - 4 x daily - 3-5 reps - 15 sec hold - BACK KNUCKLE STRETCHES   - 4 x daily - 3-5 reps - 15 sec hold - HOOK Stretch  - 4 x daily - 3-5 reps - 15-20 sec hold - Seated Finger Composite Flexion Stretch  - 4 x daily - 3-5 reps - 15 hold - PUSH KNUCKLES DOWN  - 4 x daily - 3-5 reps - 15 seconds hold - Thumb Opposition  - 4-6 x daily - 10 reps - Hand AROM Reverse Blocking  - 4-6 x daily - 10-15 reps - Tendon Glides  - 4-6 x daily - 3-5 reps - 2-3 seconds hold - Full Fist  - 2-3 x daily - 5 reps - Seated Claw Fist with Putty  - 2-3 x daily - 5 reps - Finger Extension "Pizza!"   - 2-3 x daily - 5 reps - Thumb Opposition with Putty  - 2-3 x daily - 5 reps - Cutting Putty  - 2-3 x daily - 5 reps - Finger Spread  - 2-3 x daily - 5 reps  PATIENT EDUCATION: Education details: See tx section above for details  Person educated: Patient Education method: Verbal Instruction, Teach back, Handouts  Education comprehension: States and demonstrates understanding, Additional Education required    HOME EXERCISE PROGRAM: Access Code: [email or web address] URL: [email or web address] Date: 09/06/2023 Prepared by: Fannie Knee   GOALS: Goals reviewed with patient? Yes   SHORT TERM GOALS: (STG required if POC>30 days) Target Date: 09/17/23  Pt will obtain protective, custom orthotic. Goal status: 09/06/23: MET  2.  Pt will demo/state understanding of initial HEP to improve pain levels and prerequisite motion. Goal status: 09/14/2023: Met   LONG TERM GOALS: Target Date: 11/05/23  Pt will improve functional ability by decreased impairment per Quick DASH assessment from 66% to 15% or better, for better quality of life. Goal status: 10/13/23: MET  2.  Pt will improve grip strength in Rt hand to at least 30lbs for functional use at home and in IADLs. Goal status: INITIAL  3.  Pt will improve A/ROM in Rt IF, MF TAM to at  least 200* each, to have functional motion for tasks like reach and grasp.  Goal status: INITIAL  4.  Pt will improve strength in Rt wrist flex/ext to at least 4+/5 MMT to have increased functional ability to carry out selfcare and higher-level homecare tasks with less difficulty. Goal status: 10/13/23: MET  5.  Pt will improve coordination skills in Rt dom hand, as seen by [facility] score on nine-hole peg testing to have increased functional ability to carry out fine motor tasks (fasteners, etc.) and more complex, coordinated IADLs (meal prep, sports, etc.).  Goal status: INITIAL  6.  Pt will decrease pain at rest  from 1-2/10 to 0/10 to have better sleep and occupational participation in daily roles. Goal status: 10/13/23: MET   ASSESSMENT:  CLINICAL IMPRESSION: 10/21/23: ***  10/13/23: He had  been doing so well that he was able to take a week off of therapy and now he returns still doing well and actually tolerating hand strength and higher level functional tasks with no problems now.  If he can significantly improve his grip strength by next week and still have no pain or problems we will consider discharge unless he has any further goals or problems that he needs help with in therapy.    PLAN:  OT FREQUENCY: 1-2x/week  OT DURATION: 8 weeks through 11/05/2023 and up to 14 total visits as needed  PLANNED INTERVENTIONS: 97168 OT Re-evaluation, 97535 self care/ADL training, 91478 therapeutic exercise, 97530 therapeutic activity, 97112 neuromuscular re-education, 97140 manual therapy, 97035 ultrasound, 97039 fluidotherapy, 97010 moist heat, 97010 cryotherapy, 97034 contrast bath, 97760 Orthotics management and training, 29562 Splinting (initial encounter), [id number] Subsequent splinting/medication, scar mobilization, passive range of motion, compression bandaging, Dry needling, coping strategies training, and patient/family education   CONSULTED AND AGREED WITH PLAN OF CARE: Patient  PLAN FOR NEXT  SESSION:   ***  Fannie Knee, OTR/L, CHT 10/19/2023, 5:39 PM

## 2023-10-21 ENCOUNTER — Encounter: Payer: Self-pay | Admitting: Rehabilitative and Restorative Service Providers"

## 2023-10-21 ENCOUNTER — Ambulatory Visit: Payer: Medicare Other | Admitting: Rehabilitative and Restorative Service Providers"

## 2023-10-21 DIAGNOSIS — M25641 Stiffness of right hand, not elsewhere classified: Secondary | ICD-10-CM

## 2023-10-21 DIAGNOSIS — M79641 Pain in right hand: Secondary | ICD-10-CM | POA: Diagnosis not present

## 2023-10-21 DIAGNOSIS — R278 Other lack of coordination: Secondary | ICD-10-CM | POA: Diagnosis not present

## 2023-10-21 DIAGNOSIS — M6281 Muscle weakness (generalized): Secondary | ICD-10-CM

## 2023-10-21 DIAGNOSIS — R600 Localized edema: Secondary | ICD-10-CM

## 2023-10-25 DIAGNOSIS — M791 Myalgia, unspecified site: Secondary | ICD-10-CM | POA: Diagnosis not present

## 2023-10-27 ENCOUNTER — Encounter: Payer: Medicare Other | Admitting: Rehabilitative and Restorative Service Providers"

## 2023-11-03 ENCOUNTER — Encounter: Payer: Medicare Other | Admitting: Rehabilitative and Restorative Service Providers"

## 2023-11-10 DIAGNOSIS — R7303 Prediabetes: Secondary | ICD-10-CM | POA: Diagnosis not present

## 2023-11-10 DIAGNOSIS — Z79899 Other long term (current) drug therapy: Secondary | ICD-10-CM | POA: Diagnosis not present

## 2023-11-10 DIAGNOSIS — R800 Isolated proteinuria: Secondary | ICD-10-CM | POA: Diagnosis not present

## 2023-11-10 DIAGNOSIS — E7800 Pure hypercholesterolemia, unspecified: Secondary | ICD-10-CM | POA: Diagnosis not present

## 2023-11-15 ENCOUNTER — Other Ambulatory Visit (INDEPENDENT_AMBULATORY_CARE_PROVIDER_SITE_OTHER): Payer: Self-pay

## 2023-11-15 ENCOUNTER — Ambulatory Visit (INDEPENDENT_AMBULATORY_CARE_PROVIDER_SITE_OTHER): Payer: Medicare Other | Admitting: Orthopedic Surgery

## 2023-11-15 DIAGNOSIS — M79641 Pain in right hand: Secondary | ICD-10-CM

## 2023-11-15 DIAGNOSIS — M19041 Primary osteoarthritis, right hand: Secondary | ICD-10-CM

## 2023-11-15 NOTE — Progress Notes (Signed)
   Larry [name] - 73 y.o. male MRN [id number]  Date of birth: Mar 22, 1951  Office Visit Note: Visit Date: 11/15/2023 PCP: Georgianne Fick, MD Referred by: Georgianne Fick, MD  Subjective:  HPI: Larry [name] is a 73 y.o. male who presents today for follow up 12 weeks status post right index/long finger metacarpophalangeal joint arthroplasty.  He is doing very well overall, has met all postoperative goals from occupational therapy standpoint and has been discharged.  He has resumed all activities without restriction and is very pleased with his outcome and progress.  Pertinent ROS were reviewed with the patient and found to be negative unless otherwise specified above in HPI.   Assessment & Plan: Visit Diagnoses:  1. Pain in right hand   2. 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  At this juncture, he is cleared for all activities without restriction and can follow-up with me as needed.  Follow-up: No follow-ups on file.   Meds & Orders: No orders of the defined types were placed in this encounter.   Orders Placed This Encounter  Procedures   XR Hand Complete Right     Procedures: No procedures performed       Objective:   Vital Signs: There were no vitals taken for this visit.  Ortho Exam Right hand: - Well-healed dorsal incision, transverse in nature over the index and long finger MP region - Full composite fist without significant restriction, no rotational abnormalities, normal cascade - Sensation intact in all distributions, hand is warm well-perfused - Grip strength testing Jamar 2 right 55, left 60  Imaging: XR Hand Complete Right Result Date: 11/15/2023 X-rays of the right hand, multiple views were obtained today X-rays demonstrate stable appearance of the silicone arthroplasties of the index and long finger MCP joints.   Appropriate spacing is notable in all planes.    Yanni Ruberg Trevor Mace, M.D. [facility], Hand Surgery

## 2023-11-17 DIAGNOSIS — G4733 Obstructive sleep apnea (adult) (pediatric): Secondary | ICD-10-CM | POA: Diagnosis not present

## 2023-11-26 DIAGNOSIS — Z0184 Encounter for antibody response examination: Secondary | ICD-10-CM | POA: Diagnosis not present

## 2023-11-29 DIAGNOSIS — R0981 Nasal congestion: Secondary | ICD-10-CM | POA: Diagnosis not present

## 2023-11-29 DIAGNOSIS — R051 Acute cough: Secondary | ICD-10-CM | POA: Diagnosis not present

## 2023-12-14 DIAGNOSIS — B078 Other viral warts: Secondary | ICD-10-CM | POA: Diagnosis not present

## 2023-12-23 ENCOUNTER — Inpatient Hospital Stay: Payer: Medicare Other | Admitting: Hematology and Oncology

## 2023-12-23 ENCOUNTER — Inpatient Hospital Stay: Payer: Medicare Other | Attending: Internal Medicine

## 2023-12-23 VITALS — BP 176/73 | HR 66 | Temp 98.3°F | Resp 17 | Ht 66.5 in | Wt 186.2 lb

## 2023-12-23 DIAGNOSIS — D751 Secondary polycythemia: Secondary | ICD-10-CM | POA: Insufficient documentation

## 2023-12-23 LAB — IRON AND IRON BINDING CAPACITY (CC-WL,HP ONLY)
Iron: 110 ug/dL (ref 45–182)
Saturation Ratios: 25 % (ref 17.9–39.5)
TIBC: 440 ug/dL (ref 250–450)
UIBC: 330 ug/dL (ref 117–376)

## 2023-12-23 LAB — CBC WITH DIFFERENTIAL (CANCER CENTER ONLY)
Abs Immature Granulocytes: 0.13 10*3/uL — ABNORMAL HIGH (ref 0.00–0.07)
Basophils Absolute: 0.1 10*3/uL (ref 0.0–0.1)
Basophils Relative: 1 %
Eosinophils Absolute: 0.1 10*3/uL (ref 0.0–0.5)
Eosinophils Relative: 1 %
HCT: 49.2 % (ref 39.0–52.0)
Hemoglobin: 16.4 g/dL (ref 13.0–17.0)
Immature Granulocytes: 2 %
Lymphocytes Relative: 10 %
Lymphs Abs: 0.8 10*3/uL (ref 0.7–4.0)
MCH: 31.2 pg (ref 26.0–34.0)
MCHC: 33.3 g/dL (ref 30.0–36.0)
MCV: 93.5 fL (ref 80.0–100.0)
Monocytes Absolute: 0.8 10*3/uL (ref 0.1–1.0)
Monocytes Relative: 10 %
Neutro Abs: 6.4 10*3/uL (ref 1.7–7.7)
Neutrophils Relative %: 76 %
Platelet Count: 120 10*3/uL — ABNORMAL LOW (ref 150–400)
RBC: 5.26 MIL/uL (ref 4.22–5.81)
RDW: 14 % (ref 11.5–15.5)
WBC Count: 8.3 10*3/uL (ref 4.0–10.5)
nRBC: 0 % (ref 0.0–0.2)

## 2023-12-23 NOTE — Progress Notes (Signed)
 Patient Care Team: Georgianne Fick, MD as PCP - General (Internal Medicine)  DIAGNOSIS:  Encounter Diagnosis  Name Primary?   Polycythemia, secondary Yes     CHIEF COMPLIANT: Follow-up of secondary polycythemia  HISTORY OF PRESENT ILLNESS:   History of Present Illness The patient, with a history of high hematocrit, presents for a routine follow-up. He reports feeling tired today, but denies any headaches or trouble breathing. He had a blood draw three months ago, and the hematocrit was slightly elevated at 52. He has been drinking plenty of fluids today and notes that his blood pressure was slightly elevated this morning, which he attributes to excess fluid as he takes a diuretic. He also reports feeling the excess fluid in his hands.     ALLERGIES:  is allergic to gabapentin.  MEDICATIONS:  Current Outpatient Medications  Medication Sig Dispense Refill   amoxicillin (AMOXIL) 500 MG capsule TAKE ALL 4 TABLETS 1 HOUR PRIOR TO DENTAL APPOINTMENT 12 capsule 0   amoxicillin (AMOXIL) 500 MG tablet Take 4 tablets by mouth 1 hour prior to dental procedure. 12 tablet TABLET   fluticasone (FLONASE) 50 MCG/ACT nasal spray SPRAY 1 SPRAY INTO EACH NOSTRIL EVERY DAY for 90 days     furosemide (LASIX) 40 MG tablet Take 40 mg by mouth daily as needed for fluid or edema.      HYDROcodone-acetaminophen (NORCO/VICODIN) 5-325 MG tablet Take 1 tablet by mouth 3 (three) times daily as needed for moderate pain. To be taken after surgery 15 tablet 0   ketorolac (TORADOL) 10 MG tablet Take 1 tablet (10 mg total) by mouth 2 (two) times daily as needed. 10 tablet 0   levothyroxine (SYNTHROID) 200 MCG tablet Take 200 mcg by mouth daily before breakfast.      LORazepam (ATIVAN) 0.5 MG tablet Take 0.5-1 mg by mouth at bedtime.   1   meloxicam (MOBIC) 15 MG tablet Take 15 mg by mouth daily.      Multiple Vitamin (MULTIVITAMIN WITH MINERALS) TABS tablet Take 1 tablet by mouth daily.     omeprazole  (PRILOSEC) 40 MG capsule TAKE 1 CAPSULE EVERY DAY BEFORE DINNER 30 capsule 1   omeprazole (PRILOSEC) 40 MG capsule Take 1 capsule (40 mg total) by mouth daily. 30 capsule 6   ondansetron (ZOFRAN) 4 MG tablet Take 1 tablet (4 mg total) by mouth every 8 (eight) hours as needed for nausea or vomiting. 40 tablet 0   oxyCODONE (ROXICODONE) 5 MG immediate release tablet Take 1 tablet (5 mg total) by mouth every 6 (six) hours as needed for severe pain (pain score 7-10). 40 tablet 0   phenylephrine (SUDAFED PE) 10 MG TABS tablet Take 10 mg by mouth every 4 (four) hours as needed.     predniSONE (STERAPRED UNI-PAK 21 TAB) 10 MG (21) TBPK tablet Take as directed 21 tablet 3   predniSONE (STERAPRED UNI-PAK 21 TAB) 10 MG (21) TBPK tablet Take as directed 21 tablet 3   Sildenafil Citrate (VIAGRA PO) Take 30-60 mg by mouth daily as needed (for erectile dysfunction). Purchased from [employer]--dose indicated on website as 30 mg     tamsulosin (FLOMAX) 0.4 MG CAPS capsule Take 0.4 mg by mouth daily.     testosterone enanthate (DELATESTRYL) 200 MG/ML injection Inject 200 mg into the muscle every 14 (fourteen) days. Fridays.     verapamil (CALAN-SR) 180 MG CR tablet Take 180 mg by mouth daily.  15   vitamin B-12 (CYANOCOBALAMIN) 100 MCG tablet Take 100  mcg by mouth daily.     No current facility-administered medications for this visit.    PHYSICAL EXAMINATION: ECOG PERFORMANCE STATUS: 1 - Symptomatic but completely ambulatory  Vitals:   12/23/23 1541 12/23/23 1543  BP: (!) 179/77 (!) 176/73  Pulse: 66   Resp: 17   Temp: 98.3 F (36.8 C)   SpO2: 96%    Filed Weights   12/23/23 1541  Weight: 186 lb 3.2 oz (84.5 kg)      LABORATORY DATA:  I have reviewed the data as listed    Latest Ref Rng & Units 08/16/2023    3:49 PM 03/28/2023    3:16 AM 03/28/2023    2:28 AM  CMP  Glucose 70 - 99 mg/dL 409  811  914   BUN 8 - 23 mg/dL 24  20  18    Creatinine 0.61 - 1.24 mg/dL 7.82  9.56  2.13   Sodium  135 - 145 mmol/L 139  135  131   Potassium 3.5 - 5.1 mmol/L 3.9  3.9  4.3   Chloride 98 - 111 mmol/L 101  100  100   CO2 22 - 32 mmol/L 30   19   Calcium 8.9 - 10.3 mg/dL 9.5   8.5   Total Protein 6.5 - 8.1 g/dL 6.9   6.1   Total Bilirubin <1.2 mg/dL 1.1   2.2   Alkaline Phos 38 - 126 U/L 54   56   AST 15 - 41 U/L 31   29   ALT 0 - 44 U/L 39   22     Lab Results  Component Value Date   WBC 8.3 12/23/2023   HGB 16.4 12/23/2023   HCT 49.2 12/23/2023   MCV 93.5 12/23/2023   PLT 120 (L) 12/23/2023   NEUTROABS 6.4 12/23/2023    ASSESSMENT & PLAN:  Polycythemia, secondary Lab review 07/30/2022: Testosterone 492, hemoglobin 18.5,, hematocrit 54.3, platelets 169, creatinine 0.94, LFTs normal 09/01/2022: Hemoglobin 18.7, hematocrit 54.9, platelets 157, ferritin 132, creatinine 1.45, iron saturation 65% 09/01/2022: MPN panel: Normal (no JAK2 mutation) 09/01/2022: Hemochromatosis gene testing:c.187C>G (p.His63Asp) - Detected, heterozygous  11/18/2022: Hemoglobin 16.5, hematocrit 46, testosterone 624, ferritin 157, TSH 1.5, iron saturation 56% 02/08/2023: Hemoglobin 15.4, hematocrit 43.8, platelets 143, testosterone 791, iron saturation 31%, ferritin 55 05/13/2023: Hemoglobin 16, hematocrit 46.5, platelets 164, iron saturation 31%, ferritin 38 08/16/2023: Hemoglobin 17.7, hematocrit 52.3, iron saturation 25%, ferritin 55 12/23/2023: Hemoglobin 16.4, hematocrit 49.2, platelets 120, iron studies pending   Therefore patient has secondary polycythemia Also has heterozygous hemochromatosis   Treatment plan: Phlebotomy with a goal of hematocrit to stay below 45 Phlebotomy every 3 months with labs We will request phlebotomy to be done next week. Follow-up with me in 6 months ------------------------------------- Assessment and Plan Assessment & Plan Secondary polycythemia Hematocrit at 52%, target <50%, ideally <45%. - Perform phlebotomy to reduce hematocrit.      No orders of the defined  types were placed in this encounter.  The patient has a good understanding of the overall plan. he agrees with it. he will call with any problems that may develop before the next visit here. Total time spent: 30 mins including face to face time and time spent for planning, charting and co-ordination of care   Tamsen Meek, MD 12/23/23

## 2023-12-23 NOTE — Assessment & Plan Note (Signed)
 Lab review 07/30/2022: Testosterone 492, hemoglobin 18.5,, hematocrit 54.3, platelets 169, creatinine 0.94, LFTs normal 09/01/2022: Hemoglobin 18.7, hematocrit 54.9, platelets 157, ferritin 132, creatinine 1.45, iron saturation 65% 09/01/2022: MPN panel: Normal (no JAK2 mutation) 09/01/2022: Hemochromatosis gene testing:c.187C>G (p.His63Asp) - Detected, heterozygous  11/18/2022: Hemoglobin 16.5, hematocrit 46, testosterone 624, ferritin 157, TSH 1.5, iron saturation 56% 02/08/2023: Hemoglobin 15.4, hematocrit 43.8, platelets 143, testosterone 791, iron saturation 31%, ferritin 55 05/13/2023: Hemoglobin 16, hematocrit 46.5, platelets 164, iron saturation 31%, ferritin 38 08/16/2023: Hemoglobin 17.7, hematocrit 52.3, iron saturation 25%, ferritin 55   Therefore patient has secondary polycythemia Also has heterozygous hemochromatosis   Treatment plan: Phlebotomy with a goal of ferritin of 50 Because of upcoming surgery, we will plan for phlebotomy in 1 month.

## 2023-12-24 ENCOUNTER — Encounter

## 2023-12-24 ENCOUNTER — Telehealth: Payer: Self-pay | Admitting: Hematology and Oncology

## 2023-12-24 LAB — FERRITIN: Ferritin: 25 ng/mL (ref 24–336)

## 2023-12-24 NOTE — Telephone Encounter (Signed)
 Scheduled appointments per 3/27 los. Talked with the patient and he is aware of the made appointments.

## 2023-12-27 ENCOUNTER — Inpatient Hospital Stay

## 2023-12-27 VITALS — BP 148/66 | HR 62 | Temp 98.2°F | Resp 18

## 2023-12-27 DIAGNOSIS — D751 Secondary polycythemia: Secondary | ICD-10-CM

## 2023-12-27 NOTE — Progress Notes (Signed)
 [address] Perkin presents today for phlebotomy per MD orders. Phlebotomy procedure started at 1604 and ended at 1610. 500 grams removed. Patient observed for 30 minutes after procedure without any incident. Patient tolerated procedure well. IV needle removed intact.

## 2024-01-06 DIAGNOSIS — H524 Presbyopia: Secondary | ICD-10-CM | POA: Diagnosis not present

## 2024-01-25 DIAGNOSIS — G894 Chronic pain syndrome: Secondary | ICD-10-CM | POA: Diagnosis not present

## 2024-01-25 DIAGNOSIS — M7918 Myalgia, other site: Secondary | ICD-10-CM | POA: Diagnosis not present

## 2024-01-31 DIAGNOSIS — R5383 Other fatigue: Secondary | ICD-10-CM | POA: Diagnosis not present

## 2024-01-31 DIAGNOSIS — I10 Essential (primary) hypertension: Secondary | ICD-10-CM | POA: Diagnosis not present

## 2024-01-31 DIAGNOSIS — E7800 Pure hypercholesterolemia, unspecified: Secondary | ICD-10-CM | POA: Diagnosis not present

## 2024-01-31 DIAGNOSIS — D696 Thrombocytopenia, unspecified: Secondary | ICD-10-CM | POA: Diagnosis not present

## 2024-02-07 DIAGNOSIS — I10 Essential (primary) hypertension: Secondary | ICD-10-CM | POA: Diagnosis not present

## 2024-02-07 DIAGNOSIS — I700 Atherosclerosis of aorta: Secondary | ICD-10-CM | POA: Diagnosis not present

## 2024-02-07 DIAGNOSIS — E039 Hypothyroidism, unspecified: Secondary | ICD-10-CM | POA: Diagnosis not present

## 2024-02-07 DIAGNOSIS — E291 Testicular hypofunction: Secondary | ICD-10-CM | POA: Diagnosis not present

## 2024-02-08 DIAGNOSIS — H25813 Combined forms of age-related cataract, bilateral: Secondary | ICD-10-CM | POA: Diagnosis not present

## 2024-02-08 DIAGNOSIS — H18513 Endothelial corneal dystrophy, bilateral: Secondary | ICD-10-CM | POA: Diagnosis not present

## 2024-02-08 DIAGNOSIS — H40033 Anatomical narrow angle, bilateral: Secondary | ICD-10-CM | POA: Diagnosis not present

## 2024-02-22 DIAGNOSIS — Z125 Encounter for screening for malignant neoplasm of prostate: Secondary | ICD-10-CM | POA: Diagnosis not present

## 2024-02-29 DIAGNOSIS — N5201 Erectile dysfunction due to arterial insufficiency: Secondary | ICD-10-CM | POA: Diagnosis not present

## 2024-02-29 DIAGNOSIS — Z125 Encounter for screening for malignant neoplasm of prostate: Secondary | ICD-10-CM | POA: Diagnosis not present

## 2024-03-02 DIAGNOSIS — G4733 Obstructive sleep apnea (adult) (pediatric): Secondary | ICD-10-CM | POA: Diagnosis not present

## 2024-03-02 DIAGNOSIS — H25811 Combined forms of age-related cataract, right eye: Secondary | ICD-10-CM | POA: Diagnosis not present

## 2024-03-15 DIAGNOSIS — E291 Testicular hypofunction: Secondary | ICD-10-CM | POA: Diagnosis not present

## 2024-03-16 DIAGNOSIS — H25812 Combined forms of age-related cataract, left eye: Secondary | ICD-10-CM | POA: Diagnosis not present

## 2024-03-16 DIAGNOSIS — E039 Hypothyroidism, unspecified: Secondary | ICD-10-CM | POA: Diagnosis not present

## 2024-03-21 DIAGNOSIS — E039 Hypothyroidism, unspecified: Secondary | ICD-10-CM | POA: Diagnosis not present

## 2024-03-23 ENCOUNTER — Other Ambulatory Visit: Payer: Self-pay

## 2024-03-23 DIAGNOSIS — E291 Testicular hypofunction: Secondary | ICD-10-CM | POA: Diagnosis not present

## 2024-03-23 DIAGNOSIS — E039 Hypothyroidism, unspecified: Secondary | ICD-10-CM | POA: Diagnosis not present

## 2024-03-23 DIAGNOSIS — D751 Secondary polycythemia: Secondary | ICD-10-CM

## 2024-03-24 ENCOUNTER — Inpatient Hospital Stay: Attending: Hematology and Oncology

## 2024-03-24 ENCOUNTER — Inpatient Hospital Stay

## 2024-03-24 DIAGNOSIS — D751 Secondary polycythemia: Secondary | ICD-10-CM

## 2024-03-24 LAB — IRON AND IRON BINDING CAPACITY (CC-WL,HP ONLY)
Iron: 107 ug/dL (ref 45–182)
Saturation Ratios: 22 % (ref 17.9–39.5)
TIBC: 490 ug/dL — ABNORMAL HIGH (ref 250–450)
UIBC: 383 ug/dL — ABNORMAL HIGH (ref 117–376)

## 2024-03-24 LAB — CBC WITH DIFFERENTIAL (CANCER CENTER ONLY)
Abs Immature Granulocytes: 0.05 10*3/uL (ref 0.00–0.07)
Basophils Absolute: 0 10*3/uL (ref 0.0–0.1)
Basophils Relative: 0 %
Eosinophils Absolute: 0.1 10*3/uL (ref 0.0–0.5)
Eosinophils Relative: 2 %
HCT: 45.7 % (ref 39.0–52.0)
Hemoglobin: 15.3 g/dL (ref 13.0–17.0)
Immature Granulocytes: 1 %
Lymphocytes Relative: 19 %
Lymphs Abs: 1.4 10*3/uL (ref 0.7–4.0)
MCH: 29.2 pg (ref 26.0–34.0)
MCHC: 33.5 g/dL (ref 30.0–36.0)
MCV: 87.2 fL (ref 80.0–100.0)
Monocytes Absolute: 0.6 10*3/uL (ref 0.1–1.0)
Monocytes Relative: 7 %
Neutro Abs: 5.4 10*3/uL (ref 1.7–7.7)
Neutrophils Relative %: 71 %
Platelet Count: 164 10*3/uL (ref 150–400)
RBC: 5.24 MIL/uL (ref 4.22–5.81)
RDW: 15 % (ref 11.5–15.5)
WBC Count: 7.5 10*3/uL (ref 4.0–10.5)
nRBC: 0 % (ref 0.0–0.2)

## 2024-03-24 LAB — FERRITIN: Ferritin: 13 ng/mL — ABNORMAL LOW (ref 24–336)

## 2024-03-24 NOTE — Progress Notes (Signed)
 ph

## 2024-03-24 NOTE — Progress Notes (Signed)
 Larry [name] presents today for phlebotomy per MD orders. Phlebotomy procedure started at 1625 and ended at 1630. 528 grams removed. Patient observed for 30 minutes after procedure without any incident. Patient tolerated procedure well. IV needle removed intact.

## 2024-03-27 ENCOUNTER — Ambulatory Visit: Admitting: Orthopedic Surgery

## 2024-04-10 ENCOUNTER — Ambulatory Visit: Admitting: Orthopedic Surgery

## 2024-04-10 ENCOUNTER — Other Ambulatory Visit (INDEPENDENT_AMBULATORY_CARE_PROVIDER_SITE_OTHER): Payer: Self-pay

## 2024-04-10 DIAGNOSIS — M79642 Pain in left hand: Secondary | ICD-10-CM

## 2024-04-10 DIAGNOSIS — M25522 Pain in left elbow: Secondary | ICD-10-CM

## 2024-04-11 NOTE — Progress Notes (Signed)
   Larry [name] - 73 y.o. male MRN [id number]  Date of birth: May 15, 1951  Office Visit Note: Visit Date: 04/10/2024 PCP: Verdia Lombard, MD Referred by: Verdia Lombard, MD  Subjective:  HPI: Larry [name] is a 73 y.o. male previously known to us  status post right index/long finger metacarpophalangeal joint arthroplasty this past year.  He is doing very well overall with the right hand.  Does have some ongoing pain at the left index and long finger MCP regions with associated arthritis previously known.  Is also describing some lateral elbow pain that has been more recent and progressive in nature.  Has not undergone any prior treatment for the left elbow.  Pertinent ROS were reviewed with the patient and found to be negative unless otherwise specified above in HPI.   Assessment & Plan: Visit Diagnoses:  1. Pain in left hand   2. Pain in left elbow     Plan: Based on clinical examination, there is evidence of left-sided lateral epicondylitis.  We discussed the underlying etiology and pathophysiology of this condition.  At this juncture, he will be fitted for a counterforce brace for the left forearm for conservative care.  I did explain that he can return to us  in the future for consideration of potential cortisone injection to that region as well in order to help alleviate symptoms.  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  He would like to wait until least later in the calendar year this year before proceeding with surgical intervention.  Follow-up: No follow-ups on file.   Meds & Orders: No orders of the defined types were placed in this encounter.   Orders Placed This Encounter  Procedures   XR Hand Complete Left   XR Elbow 2 Views Left     Procedures: No procedures performed       Objective:   Vital Signs: There were no vitals taken for this  visit.  Ortho Exam Right hand: - Well-healed dorsal incision, transverse in nature over the index and long finger MP region - Full composite fist without significant restriction, no rotational abnormalities, normal cascade - Sensation intact in all distributions, hand is warm well-perfused - Grip strength testing Jamar 2 right 55, left 60  Left elbow: - Tenderness over the lateral epicondyle, pain with resisted wrist extension and long finger extension  Imaging: XR Elbow 2 Views Left Result Date: 04/11/2024 There is no evidence of fracture or dislocation. There is no evidence of arthropathy or other focal bone abnormality. Soft tissues are unremarkable.   XR Hand Complete Left Result Date: 04/11/2024 X-rays of the left hand demonstrate previously known osteoarthritis at the index and long finger MCP regions.  There is notable degenerative change seen here with joint space narrowing and subchondral sclerosis.     Sharlette Jansma Afton Alderton, M.D. [facility], Hand Surgery

## 2024-04-25 DIAGNOSIS — G894 Chronic pain syndrome: Secondary | ICD-10-CM | POA: Diagnosis not present

## 2024-04-25 DIAGNOSIS — M7918 Myalgia, other site: Secondary | ICD-10-CM | POA: Diagnosis not present

## 2024-04-26 DIAGNOSIS — M4714 Other spondylosis with myelopathy, thoracic region: Secondary | ICD-10-CM | POA: Diagnosis not present

## 2024-04-26 DIAGNOSIS — Z6829 Body mass index (BMI) 29.0-29.9, adult: Secondary | ICD-10-CM | POA: Diagnosis not present

## 2024-05-02 DIAGNOSIS — I10 Essential (primary) hypertension: Secondary | ICD-10-CM | POA: Diagnosis not present

## 2024-05-02 DIAGNOSIS — R5383 Other fatigue: Secondary | ICD-10-CM | POA: Diagnosis not present

## 2024-05-02 DIAGNOSIS — R7303 Prediabetes: Secondary | ICD-10-CM | POA: Diagnosis not present

## 2024-05-02 DIAGNOSIS — R413 Other amnesia: Secondary | ICD-10-CM | POA: Diagnosis not present

## 2024-05-02 DIAGNOSIS — E559 Vitamin D deficiency, unspecified: Secondary | ICD-10-CM | POA: Diagnosis not present

## 2024-05-02 DIAGNOSIS — E7800 Pure hypercholesterolemia, unspecified: Secondary | ICD-10-CM | POA: Diagnosis not present

## 2024-05-04 ENCOUNTER — Other Ambulatory Visit: Payer: Self-pay

## 2024-05-04 ENCOUNTER — Ambulatory Visit: Admitting: Orthopedic Surgery

## 2024-05-04 DIAGNOSIS — M25561 Pain in right knee: Secondary | ICD-10-CM | POA: Diagnosis not present

## 2024-05-04 DIAGNOSIS — M25562 Pain in left knee: Secondary | ICD-10-CM

## 2024-05-05 ENCOUNTER — Encounter: Payer: Self-pay | Admitting: Orthopedic Surgery

## 2024-05-05 NOTE — Progress Notes (Signed)
 Office Visit Note   Patient: Larry [name]           Date of Birth: 06-03-1951           MRN: [id number] Visit Date: 05/04/2024 Requested by: Verdia Lombard, MD [address] [place],  [place] [address] PCP: Verdia Lombard, MD  Subjective: Chief Complaint  Patient presents with   Left Knee - Pain    HPI: Larry [name] is a 73 y.o. male who presents to the office reporting bilateral knee pain left worse than right.  Patient has had bilateral knee replacements.  He was hit in the leg by 100 pound dog in May 2025.  Has had relatively constant pain since that time.  Reports pain primarily on the medial aspect of his knee.  Denies any swelling giving way.  Tried a knee brace initially.  Cannot take anti-inflammatories because of his kidney.  Does do Aleve rubs..                ROS: All systems reviewed are negative as they relate to the chief complaint within the history of present illness.  Patient denies fevers or chills.  Assessment & Plan: Visit Diagnoses:  1. Left knee pain, unspecified chronicity   2. Right knee pain, unspecified chronicity     Plan: Impression is excellent range of motion and stability in both knees.  Radiographically both knees look intact.  Has a trace effusion in that left knee no effusion in the right knee.  Extensor mechanism intact.  I think that Jalene has sustained a soft tissue injury to the left knee which has not really structurally compromise the knee.  No real intervention indicated at this time.  Radiographically the knees look good and he does have very good range of motion and strength.  Follow-Up Instructions: No follow-ups on file.   Orders:  Orders Placed This Encounter  Procedures   XR KNEE 3 VIEW LEFT   XR Knee 1-2 Views Right   No orders of the defined types were placed in this encounter.     Procedures: No procedures performed   Clinical Data: No additional findings.  Objective: Vital Signs:  There were no vitals taken for this visit.  Physical Exam:  Constitutional: Patient appears well-developed HEENT:  Head: Normocephalic Eyes:EOM are normal Neck: Normal range of motion Cardiovascular: Normal rate Pulmonary/chest: Effort normal Neurologic: Patient is alert Skin: Skin is warm Psychiatric: Patient has normal mood and affect  Ortho Exam: Ortho exam demonstrates full active and passive range of motion of the hips.  Pedal pulses palpable.  Both knees have good stability to varus and valgus stress at 0 30 and 90 degrees.  Trace effusion left knee no effusion right knee.  Extensor mechanism intact and nontender.  No increased anterior posterior laxity between knees.  Specialty Comments:  No specialty comments available.  Imaging: No results found.   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 08/24/2023   Degenerative arthritis of metacarpophalangeal joint of middle finger of right hand 08/24/2023   Polycythemia, secondary 09/01/2022   Hereditary hemochromatosis (HCC) 09/01/2022   Left carpal tunnel syndrome 07/07/2022   Acute thigh pain, right 05/01/2021   CMC (carpometacarpal joint) dislocation, left, initial encounter 11/12/2020   Post-operative state 08/20/2020   Nontraumatic incomplete tear of left rotator cuff 08/20/2020   AC (acromioclavicular) arthritis 08/20/2020   Complete tear of  left rotator cuff 07/09/2020   Osteoarthritis of first carpometacarpal [facility]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 05/07/2020   Osteoarthritis of left AC (acromioclavicular) joint 05/07/2020   Impingement syndrome of left shoulder 02/08/2020   S/P TKR (total knee replacement) using cement, left 10/03/2019   Cellulitis of arm 08/28/2019   Essential hypertension 02/16/2018   Sepsis due to skin infection (HCC) 02/16/2018   Chronic pain 02/16/2018   Cellulitis of left lower extremity    Lumbar  radiculopathy, chronic 01/24/2018   Lumbar stenosis with neurogenic claudication 03/23/2017   Hypothyroidism 07/25/2015   Primary osteoarthritis of right knee 07/23/2015   Primary osteoarthritis of knee 07/23/2015   Past Medical History:  Diagnosis Date   Arthritis    knees; left shoulder (04/12/2013)   Asthma    as a child (04/12/2013)   DDD (degenerative disc disease), cervical    DDD (degenerative disc disease), lumbar    Difficult intubation    2012   CERVICAL FUSION    Headache(784.0)    VERAPAMIL  FOR PREVENTION    Hemochromatosis    defective gene (04/12/2013); 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 ago, just gets jittery   Hypothyroidism    Lumbar stenosis    MRSA (methicillin resistant staph aureus) culture positive 09/10/2019   upper arm   OSA (obstructive sleep apnea)    haven't wore mask in years; had OR; still snore (04/12/2013)   Urinary frequency     History reviewed. No pertinent family history.  Past Surgical History:  Procedure Laterality Date   BACK SURGERY     CARPOMETACARPEL SUSPENSION PLASTY Left 07/19/2020   Procedure: LEFT THUMB CARPOMETACARPAL (CMC) ARTHROPLASTY;  Surgeon: Jerri Kay HERO, MD;  Location: [facility];  Service: Orthopedics;  Laterality: Left;   CERVICAL FUSION  2002; 2012   CYSTECTOMY     FINGER ARTHROPLASTY Right 08/24/2023   Procedure: RIGHT INDEX/ LONG FINGER METACARPOPHANGEAL JOINT ARTHROPLASTY;  Surgeon: Arlinda Buster, MD;  Location: [facility];  Service: Orthopedics;  Laterality: Right;  Regional block   INGUINAL HERNIA REPAIR Bilateral 1997   KNEE ARTHROSCOPY Left ~ 1997; ~ 2005   LUMBAR FUSION  2007; 04/11/2013   MICROLARYNGOSCOPY N/A 03/07/2021   Procedure: DIRECT MICROLARYNGOSCOPY WITH BIOPSY;  Surgeon: Ethyl Lonni BRAVO, MD;  Location:  SURGERY  CENTER;  Service: ENT;  Laterality: N/A;   PENILE DEBRIDEMENT  ~ 2011   X 3  FOR MRSA INFECTION     SHOULDER ARTHROSCOPY Left ~ 2003   TONSILLECTOMY  1990's   TOTAL KNEE ARTHROPLASTY Right 07/23/2015   TOTAL KNEE ARTHROPLASTY Right 07/23/2015   Procedure: TOTAL KNEE ARTHROPLASTY;  Surgeon: Maude LELON Right, MD;  Location: [facility] OR;  Service: Orthopedics;  Laterality: Right;   TOTAL KNEE ARTHROPLASTY Left 10/03/2019   Procedure: LEFT TOTAL KNEE ARTHROPLASTY;  Surgeon: Right Maude LELON, MD;  Location: [facility];  Service: Orthopedics;  Laterality: Left;   UVULOPALATOPHARYNGOPLASTY (UPPP)/TONSILLECTOMY/SEPTOPLASTY  1990's   VASECTOMY     Social History   Occupational History   Not on file  Tobacco Use   Smoking status: Former    Current packs/day: 0.00    Average packs/day: 0.5 packs/day for 15.0 years (7.5 ttl pk-yrs)    Types: Cigarettes    Start date: 10/15/1971    Quit  date: 10/14/1986    Years since quitting: 37.5   Smokeless tobacco: Never  Vaping Use   Vaping status: Never Used  Substance and Sexual Activity   Alcohol use: Yes    Comment: occasional   Drug use: No   Sexual activity: Yes

## 2024-05-09 DIAGNOSIS — I10 Essential (primary) hypertension: Secondary | ICD-10-CM | POA: Diagnosis not present

## 2024-05-09 DIAGNOSIS — I700 Atherosclerosis of aorta: Secondary | ICD-10-CM | POA: Diagnosis not present

## 2024-05-09 DIAGNOSIS — E039 Hypothyroidism, unspecified: Secondary | ICD-10-CM | POA: Diagnosis not present

## 2024-05-09 DIAGNOSIS — R1011 Right upper quadrant pain: Secondary | ICD-10-CM | POA: Diagnosis not present

## 2024-05-17 DIAGNOSIS — K8020 Calculus of gallbladder without cholecystitis without obstruction: Secondary | ICD-10-CM | POA: Diagnosis not present

## 2024-05-17 DIAGNOSIS — R1011 Right upper quadrant pain: Secondary | ICD-10-CM | POA: Diagnosis not present

## 2024-06-07 DIAGNOSIS — I10 Essential (primary) hypertension: Secondary | ICD-10-CM | POA: Diagnosis not present

## 2024-06-07 DIAGNOSIS — R5381 Other malaise: Secondary | ICD-10-CM | POA: Diagnosis not present

## 2024-06-07 DIAGNOSIS — R5383 Other fatigue: Secondary | ICD-10-CM | POA: Diagnosis not present

## 2024-06-07 DIAGNOSIS — H538 Other visual disturbances: Secondary | ICD-10-CM | POA: Diagnosis not present

## 2024-06-16 DIAGNOSIS — Z23 Encounter for immunization: Secondary | ICD-10-CM | POA: Diagnosis not present

## 2024-06-21 ENCOUNTER — Inpatient Hospital Stay

## 2024-06-21 ENCOUNTER — Inpatient Hospital Stay: Admitting: Hematology and Oncology

## 2024-06-23 ENCOUNTER — Other Ambulatory Visit: Payer: Self-pay | Admitting: *Deleted

## 2024-06-23 DIAGNOSIS — D751 Secondary polycythemia: Secondary | ICD-10-CM

## 2024-06-26 ENCOUNTER — Inpatient Hospital Stay: Attending: Hematology and Oncology

## 2024-06-26 ENCOUNTER — Inpatient Hospital Stay: Admitting: Hematology and Oncology

## 2024-06-26 ENCOUNTER — Inpatient Hospital Stay

## 2024-06-26 VITALS — BP 157/51 | HR 71 | Temp 98.7°F | Resp 18 | Ht 66.5 in | Wt 191.9 lb

## 2024-06-26 DIAGNOSIS — D751 Secondary polycythemia: Secondary | ICD-10-CM

## 2024-06-26 LAB — CBC WITH DIFFERENTIAL (CANCER CENTER ONLY)
Abs Immature Granulocytes: 0.05 K/uL (ref 0.00–0.07)
Basophils Absolute: 0 K/uL (ref 0.0–0.1)
Basophils Relative: 1 %
Eosinophils Absolute: 0.1 K/uL (ref 0.0–0.5)
Eosinophils Relative: 2 %
HCT: 41.9 % (ref 39.0–52.0)
Hemoglobin: 13.8 g/dL (ref 13.0–17.0)
Immature Granulocytes: 1 %
Lymphocytes Relative: 22 %
Lymphs Abs: 1.4 K/uL (ref 0.7–4.0)
MCH: 28.6 pg (ref 26.0–34.0)
MCHC: 32.9 g/dL (ref 30.0–36.0)
MCV: 86.7 fL (ref 80.0–100.0)
Monocytes Absolute: 0.5 K/uL (ref 0.1–1.0)
Monocytes Relative: 8 %
Neutro Abs: 4.3 K/uL (ref 1.7–7.7)
Neutrophils Relative %: 66 %
Platelet Count: 160 K/uL (ref 150–400)
RBC: 4.83 MIL/uL (ref 4.22–5.81)
RDW: 15.8 % — ABNORMAL HIGH (ref 11.5–15.5)
WBC Count: 6.3 K/uL (ref 4.0–10.5)
nRBC: 0 % (ref 0.0–0.2)

## 2024-06-26 LAB — IRON AND IRON BINDING CAPACITY (CC-WL,HP ONLY)
Iron: 58 ug/dL (ref 45–182)
Saturation Ratios: 11 % — ABNORMAL LOW (ref 17.9–39.5)
TIBC: 528 ug/dL — ABNORMAL HIGH (ref 250–450)
UIBC: 470 ug/dL — ABNORMAL HIGH (ref 117–376)

## 2024-06-26 NOTE — Assessment & Plan Note (Addendum)
 Lab review 07/30/2022: Testosterone  492, hemoglobin 18.5,, hematocrit 54.3, platelets 169, creatinine 0.94, LFTs normal 09/01/2022: Hemoglobin 18.7, hematocrit 54.9, platelets 157, ferritin 132, creatinine 1.45, iron saturation 65% 09/01/2022: MPN panel: Normal (no JAK2 mutation) 09/01/2022: Hemochromatosis gene testing:c.187C>G (p.His63Asp) - Detected, heterozygous  11/18/2022: Hemoglobin 16.5, hematocrit 46, testosterone  624, ferritin 157, TSH 1.5, iron saturation 56% 02/08/2023: Hemoglobin 15.4, hematocrit 43.8, platelets 143, testosterone  791, iron saturation 31%, ferritin 55 05/13/2023: Hemoglobin 16, hematocrit 46.5, platelets 164, iron saturation 31%, ferritin 38 08/16/2023: Hemoglobin 17.7, hematocrit 52.3, iron saturation 25%, ferritin 55 12/23/2023: Hemoglobin 16.4, hematocrit 49.2, platelets 120, iron studies pending 03/24/2024: Hemoglobin 15.3, hematocrit 45.7, iron saturation 22%   Also has heterozygous hemochromatosis   Treatment plan: Phlebotomy with a goal of hematocrit to stay below 45 Phlebotomy every 3 months with labs Follow-up with me in 6 months

## 2024-06-26 NOTE — Progress Notes (Signed)
 Patient Care Team: Verdia Lombard, MD as PCP - General (Internal Medicine)  DIAGNOSIS:  Encounter Diagnosis  Name Primary?   Polycythemia, secondary Yes   CHIEF COMPLIANT: Follow-up of secondary polycythemia  HISTORY OF PRESENT ILLNESS:   History of Present Illness Larry [name] is a 73 year old male with anemia who presents for follow-up regarding his blood counts and dietary changes.  Approximately six weeks ago, he reduced his carbohydrate intake and began feeling unwell. Blood tests showed decreased levels of Puritan and arm, which he associates with dietary changes, specifically increased consumption of chicken and salads with tuna fish, and reduced red meat intake. His blood levels have decreased over the past ten months from seventeen to thirteen.  He experiences difficulty losing weight despite dietary adjustments, including consuming cottage cheese with fruit, almonds, and fig bars. His current weight is approximately 190 pounds, previously as low as 172 pounds. He attributes some weight retention to a genetic predisposition.  He is on testosterone  therapy to prevent weakness and has been consistent with this treatment. Thyroid  function tests remain stable and within normal limits.     ALLERGIES:  is allergic to gabapentin .  MEDICATIONS:  Current Outpatient Medications  Medication Sig Dispense Refill   amoxicillin  (AMOXIL ) 500 MG capsule TAKE ALL 4 TABLETS 1 HOUR PRIOR TO DENTAL APPOINTMENT 12 capsule 0   amoxicillin  (AMOXIL ) 500 MG tablet Take 4 tablets by mouth 1 hour prior to dental procedure. 12 tablet TABLET   fluticasone (FLONASE) 50 MCG/ACT nasal spray SPRAY 1 SPRAY INTO EACH NOSTRIL EVERY DAY for 90 days     furosemide  (LASIX ) 40 MG tablet Take 40 mg by mouth daily as needed for fluid or edema.      HYDROcodone -acetaminophen  (NORCO/VICODIN) 5-325 MG tablet Take 1 tablet by mouth 3 (three) times daily as needed for moderate pain. To be taken after surgery  15 tablet 0   ketorolac  (TORADOL ) 10 MG tablet Take 1 tablet (10 mg total) by mouth 2 (two) times daily as needed. 10 tablet 0   levothyroxine  (SYNTHROID ) 200 MCG tablet Take 200 mcg by mouth daily before breakfast.      LORazepam  (ATIVAN ) 0.5 MG tablet Take 0.5-1 mg by mouth at bedtime.   1   meloxicam  (MOBIC ) 15 MG tablet Take 15 mg by mouth daily.      Multiple Vitamin (MULTIVITAMIN WITH MINERALS) TABS tablet Take 1 tablet by mouth daily.     omeprazole  (PRILOSEC) 40 MG capsule TAKE 1 CAPSULE EVERY DAY BEFORE DINNER 30 capsule 1   omeprazole  (PRILOSEC) 40 MG capsule Take 1 capsule (40 mg total) by mouth daily. 30 capsule 6   ondansetron  (ZOFRAN ) 4 MG tablet Take 1 tablet (4 mg total) by mouth every 8 (eight) hours as needed for nausea or vomiting. 40 tablet 0   oxyCODONE  (ROXICODONE ) 5 MG immediate release tablet Take 1 tablet (5 mg total) by mouth every 6 (six) hours as needed for severe pain (pain score 7-10). 40 tablet 0   phenylephrine  (SUDAFED PE) 10 MG TABS tablet Take 10 mg by mouth every 4 (four) hours as needed.     predniSONE  (STERAPRED UNI-PAK 21 TAB) 10 MG (21) TBPK tablet Take as directed 21 tablet 3   predniSONE  (STERAPRED UNI-PAK 21 TAB) 10 MG (21) TBPK tablet Take as directed 21 tablet 3   Sildenafil Citrate (VIAGRA PO) Take 30-60 mg by mouth daily as needed (for erectile dysfunction). Purchased from [employer]--dose indicated on website as 30 mg  tamsulosin  (FLOMAX ) 0.4 MG CAPS capsule Take 0.4 mg by mouth daily.     testosterone  enanthate (DELATESTRYL) 200 MG/ML injection Inject 200 mg into the muscle every 14 (fourteen) days. Fridays.     verapamil  (CALAN -SR) 180 MG CR tablet Take 180 mg by mouth daily.  15   vitamin B-12 (CYANOCOBALAMIN ) 100 MCG tablet Take 100 mcg by mouth daily.     No current facility-administered medications for this visit.    PHYSICAL EXAMINATION: ECOG PERFORMANCE STATUS: 1 - Symptomatic but completely ambulatory  Vitals:   06/26/24 1523  06/26/24 1524  BP: (!) 173/73 (!) 157/51  Pulse: 71   Resp: 18   Temp: 98.7 F (37.1 C)   SpO2: 97%    Filed Weights   06/26/24 1523  Weight: 191 lb 14.4 oz (87 kg)    Physical Exam   (exam performed in the presence of a chaperone)  LABORATORY DATA:  I have reviewed the data as listed    Latest Ref Rng & Units 08/16/2023    3:49 PM 03/28/2023    3:16 AM 03/28/2023    2:28 AM  CMP  Glucose 70 - 99 mg/dL 895  847  842   BUN 8 - 23 mg/dL 24  20  18    Creatinine 0.61 - 1.24 mg/dL 8.87  8.59  8.65   Sodium 135 - 145 mmol/L 139  135  131   Potassium 3.5 - 5.1 mmol/L 3.9  3.9  4.3   Chloride 98 - 111 mmol/L 101  100  100   CO2 22 - 32 mmol/L 30   19   Calcium 8.9 - 10.3 mg/dL 9.5   8.5   Total Protein 6.5 - 8.1 g/dL 6.9   6.1   Total Bilirubin <1.2 mg/dL 1.1   2.2   Alkaline Phos 38 - 126 U/L 54   56   AST 15 - 41 U/L 31   29   ALT 0 - 44 U/L 39   22     Lab Results  Component Value Date   WBC 6.3 06/26/2024   HGB 13.8 06/26/2024   HCT 41.9 06/26/2024   MCV 86.7 06/26/2024   PLT 160 06/26/2024   NEUTROABS 4.3 06/26/2024    ASSESSMENT & PLAN:  Polycythemia, secondary Lab review 07/30/2022: Testosterone  492, hemoglobin 18.5,, hematocrit 54.3, platelets 169, creatinine 0.94, LFTs normal 09/01/2022: Hemoglobin 18.7, hematocrit 54.9, platelets 157, ferritin 132, creatinine 1.45, iron saturation 65% 09/01/2022: MPN panel: Normal (no JAK2 mutation) 09/01/2022: Hemochromatosis gene testing:c.187C>G (p.His63Asp) - Detected, heterozygous  11/18/2022: Hemoglobin 16.5, hematocrit 46, testosterone  624, ferritin 157, TSH 1.5, iron saturation 56% 02/08/2023: Hemoglobin 15.4, hematocrit 43.8, platelets 143, testosterone  791, iron saturation 31%, ferritin 55 05/13/2023: Hemoglobin 16, hematocrit 46.5, platelets 164, iron saturation 31%, ferritin 38 08/16/2023: Hemoglobin 17.7, hematocrit 52.3, iron saturation 25%, ferritin 55 12/23/2023: Hemoglobin 16.4, hematocrit 49.2, platelets 120,  iron studies pending 03/24/2024: Hemoglobin 15.3, hematocrit 45.7, iron saturation 22% 06/26/2024: Hemoglobin 13.8, hematocrit 41.9   Also has heterozygous hemochromatosis   Treatment plan: Phlebotomy with a goal of hematocrit to stay below 45 Today he does not need a phlebotomy. Phlebotomy every 3 months with labs Follow-up with me in 6 months      No orders of the defined types were placed in this encounter.  The patient has a good understanding of the overall plan. he agrees with it. he will call with any problems that may develop before the next visit here. Total time spent: 30 mins  including face to face time and time spent for planning, charting and co-ordination of care   Viinay K Nareg Breighner, MD 06/26/24

## 2024-06-27 LAB — FERRITIN: Ferritin: 27 ng/mL (ref 24–336)

## 2024-07-05 DIAGNOSIS — K080 Exfoliation of teeth due to systemic causes: Secondary | ICD-10-CM | POA: Diagnosis not present

## 2024-07-11 DIAGNOSIS — L821 Other seborrheic keratosis: Secondary | ICD-10-CM | POA: Diagnosis not present

## 2024-07-11 DIAGNOSIS — Z1283 Encounter for screening for malignant neoplasm of skin: Secondary | ICD-10-CM | POA: Diagnosis not present

## 2024-07-11 DIAGNOSIS — D225 Melanocytic nevi of trunk: Secondary | ICD-10-CM | POA: Diagnosis not present

## 2024-07-11 DIAGNOSIS — L820 Inflamed seborrheic keratosis: Secondary | ICD-10-CM | POA: Diagnosis not present

## 2024-07-20 DIAGNOSIS — K080 Exfoliation of teeth due to systemic causes: Secondary | ICD-10-CM | POA: Diagnosis not present

## 2024-07-25 ENCOUNTER — Other Ambulatory Visit: Payer: Self-pay

## 2024-07-25 ENCOUNTER — Ambulatory Visit: Admitting: Orthopaedic Surgery

## 2024-07-25 DIAGNOSIS — M7918 Myalgia, other site: Secondary | ICD-10-CM | POA: Diagnosis not present

## 2024-07-25 DIAGNOSIS — M25551 Pain in right hip: Secondary | ICD-10-CM | POA: Diagnosis not present

## 2024-07-25 DIAGNOSIS — G894 Chronic pain syndrome: Secondary | ICD-10-CM | POA: Diagnosis not present

## 2024-07-25 NOTE — Progress Notes (Signed)
 Office Visit Note   Patient: Larry [name]           Date of Birth: 1951/02/29           MRN: [id number] Visit Date: 07/25/2024              Requested by: Verdia Lombard, MD [address] [place],  [place] [address] PCP: Verdia Lombard, MD   Assessment & Plan: Visit Diagnoses:  1. Pain in right hip     Plan: Impression is 73 year old gentleman with right hip pain.  I think he may have strained his abductors or his external rotators.  I do not think it has anything to do with his hip or his back.  Sounds like it is getting better overall.  I would recommend continuing with symptomatic treatment.  Follow-up if symptoms persist in another 6 weeks.  Follow-Up Instructions: No follow-ups on file.   Orders:  Orders Placed This Encounter  Procedures   XR HIP UNILAT W OR W/O PELVIS 2-3 VIEWS RIGHT   No orders of the defined types were placed in this encounter.     Procedures: No procedures performed   Clinical Data: No additional findings.   Subjective: Chief Complaint  Patient presents with   Right Hip - Pain    HPI Larry [name] is a very pleasant 73 year old gentleman here for evaluation of posterior lateral right hip pain for about 5 to 6 weeks that started after he mowed his lawn.  He had to lean to the left when he was on and thank manage and felt something pull.  He has pain with raising his leg and with certain movements.  Denies any radicular back pain. Review of Systems  Constitutional: Negative.   HENT: Negative.    Eyes: Negative.   Respiratory: Negative.    Cardiovascular: Negative.   Gastrointestinal: Negative.   Endocrine: Negative.   Genitourinary: Negative.   Skin: Negative.   Allergic/Immunologic: Negative.   Neurological: Negative.   Hematological: Negative.   Psychiatric/Behavioral: Negative.    All other systems reviewed and are negative.    Objective: Vital Signs: There were no vitals taken for this visit.  Physical  Exam Vitals and nursing note reviewed.  Constitutional:      Appearance: He is well-developed.  HENT:     Head: Normocephalic and atraumatic.  Eyes:     Pupils: Pupils are equal, round, and reactive to light.  Pulmonary:     Effort: Pulmonary effort is normal.  Abdominal:     Palpations: Abdomen is soft.  Musculoskeletal:        General: Normal range of motion.     Cervical back: Neck supple.  Skin:    General: Skin is warm.  Neurological:     Mental Status: He is alert and oriented to person, place, and time.  Psychiatric:        Behavior: Behavior normal.        Thought Content: Thought content normal.        Judgment: Judgment normal.     Ortho Exam Examination of the hip shows fluid painless range of motion.  He endorses deep-seated pain in the abductors.  He has no real pain with activation of the abductors. Specialty Comments:  No specialty comments available.  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 08/24/2023   Degenerative arthritis of metacarpophalangeal joint of middle finger of right hand 08/24/2023   Polycythemia, secondary 09/01/2022   Hereditary hemochromatosis 09/01/2022   Left carpal tunnel syndrome 07/07/2022   Acute thigh pain, right 05/01/2021   CMC (carpometacarpal joint) dislocation, left, initial encounter 11/12/2020   Post-operative state 08/20/2020   Nontraumatic incomplete tear of left rotator cuff 08/20/2020   AC (acromioclavicular) arthritis 08/20/2020   Complete tear of left rotator cuff 07/09/2020   Osteoarthritis of first carpometacarpal [facility]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 05/07/2020   Osteoarthritis of left AC (acromioclavicular) joint 05/07/2020   Impingement syndrome of left shoulder 02/08/2020   S/P TKR (total knee  replacement) using cement, left 10/03/2019   Cellulitis of arm 08/28/2019   Essential hypertension 02/16/2018   Sepsis due to skin infection (HCC) 02/16/2018   Chronic pain 02/16/2018   Cellulitis of left lower extremity    Lumbar radiculopathy, chronic 01/24/2018   Lumbar stenosis with neurogenic claudication 03/23/2017   Hypothyroidism 07/25/2015   Primary osteoarthritis of right knee 07/23/2015   Primary osteoarthritis of knee 07/23/2015   Past Medical History:  Diagnosis Date   Arthritis    knees; left shoulder (04/12/2013)   Asthma    as a child (04/12/2013)   DDD (degenerative disc disease), cervical    DDD (degenerative disc disease), lumbar    Difficult intubation    2012   CERVICAL FUSION    Headache(784.0)    VERAPAMIL  FOR PREVENTION    Hemochromatosis    defective gene (04/12/2013); 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 ago, just gets jittery   Hypothyroidism    Lumbar stenosis    MRSA (methicillin resistant staph aureus) culture positive 09/10/2019   upper arm   OSA (obstructive sleep apnea)    haven't wore mask in years; had OR; still snore (04/12/2013)   Urinary frequency     No family history on file.  Past Surgical History:  Procedure Laterality Date   BACK SURGERY     CARPOMETACARPEL SUSPENSION PLASTY Left 07/19/2020   Procedure: LEFT THUMB CARPOMETACARPAL (CMC) ARTHROPLASTY;  Surgeon: Jerri Kay HERO, MD;  Location: [facility];  Service: Orthopedics;  Laterality: Left;   CERVICAL FUSION  2002; 2012   CYSTECTOMY     FINGER ARTHROPLASTY Right 08/24/2023   Procedure: RIGHT INDEX/ LONG FINGER METACARPOPHANGEAL JOINT ARTHROPLASTY;  Surgeon: Arlinda Buster, MD;  Location: [facility];  Service: Orthopedics;  Laterality: Right;  Regional block   INGUINAL HERNIA REPAIR Bilateral 1997   KNEE  ARTHROSCOPY Left ~ 1997; ~ 2005   LUMBAR FUSION  2007; 04/11/2013   MICROLARYNGOSCOPY N/A 03/07/2021   Procedure: DIRECT MICROLARYNGOSCOPY WITH BIOPSY;  Surgeon: Ethyl Lonni BRAVO, MD;  Location: [facility];  Service: ENT;  Laterality: N/A;   PENILE DEBRIDEMENT  ~ 2011   X 3  FOR MRSA INFECTION     SHOULDER ARTHROSCOPY Left ~ 2003   TONSILLECTOMY  1990's   TOTAL KNEE ARTHROPLASTY Right 07/23/2015   TOTAL KNEE ARTHROPLASTY Right 07/23/2015   Procedure: TOTAL KNEE ARTHROPLASTY;  Surgeon: Maude LELON Right, MD;  Location: [facility];  Service: Orthopedics;  Laterality: Right;   TOTAL KNEE ARTHROPLASTY Left 10/03/2019   Procedure: LEFT  TOTAL KNEE ARTHROPLASTY;  Surgeon: Anderson Maude ORN, MD;  Location: [facility];  Service: Orthopedics;  Laterality: Left;   UVULOPALATOPHARYNGOPLASTY (UPPP)/TONSILLECTOMY/SEPTOPLASTY  1990's   VASECTOMY     Social History   Occupational History   Not on file  Tobacco Use   Smoking status: Former    Current packs/day: 0.00    Average packs/day: 0.5 packs/day for 15.0 years (7.5 ttl pk-yrs)    Types: Cigarettes    Start date: 10/15/1971    Quit date: 10/14/1986    Years since quitting: 37.8   Smokeless tobacco: Never  Vaping Use   Vaping status: Never Used  Substance and Sexual Activity   Alcohol use: Yes    Comment: occasional   Drug use: No   Sexual activity: Yes

## 2024-07-31 ENCOUNTER — Encounter: Payer: Self-pay | Admitting: Radiology

## 2024-08-17 ENCOUNTER — Telehealth: Payer: Self-pay | Admitting: Orthopaedic Surgery

## 2024-08-17 NOTE — Telephone Encounter (Signed)
 Pt called and scheduled an appt for Dec 9 @ 7:15 for a fall he had yesterday, but would like to be seen sooner. I put him on the wait list but he insisted that I send a message back to because he wants to be squeezed in if possible. Call back number is [phone].

## 2024-08-17 NOTE — Telephone Encounter (Signed)
 Called and scheduled to next week.

## 2024-08-21 DIAGNOSIS — G4733 Obstructive sleep apnea (adult) (pediatric): Secondary | ICD-10-CM | POA: Diagnosis not present

## 2024-08-21 DIAGNOSIS — E039 Hypothyroidism, unspecified: Secondary | ICD-10-CM | POA: Diagnosis not present

## 2024-08-21 DIAGNOSIS — I10 Essential (primary) hypertension: Secondary | ICD-10-CM | POA: Diagnosis not present

## 2024-08-21 DIAGNOSIS — K219 Gastro-esophageal reflux disease without esophagitis: Secondary | ICD-10-CM | POA: Diagnosis not present

## 2024-08-22 ENCOUNTER — Other Ambulatory Visit (INDEPENDENT_AMBULATORY_CARE_PROVIDER_SITE_OTHER): Payer: Self-pay

## 2024-08-22 ENCOUNTER — Ambulatory Visit: Admitting: Orthopaedic Surgery

## 2024-08-22 DIAGNOSIS — M25512 Pain in left shoulder: Secondary | ICD-10-CM | POA: Diagnosis not present

## 2024-08-22 NOTE — Progress Notes (Signed)
 Office Visit Note   Patient: Larry [name]           Date of Birth: 1951/02/06           MRN: [id number] Visit Date: 08/22/2024              Requested by: Verdia Lombard, MD [address] [place],  [place] [address] PCP: Verdia Lombard, MD   Assessment & Plan: Visit Diagnoses:  1. Acute pain of left shoulder     Plan: History of Present Illness Larry [name] is a 73 year old male with a history of left rotator cuff surgery who presents with left shoulder pain following a fall.  He developed left shoulder pain after tripping over a speed bump and landing directly on his left shoulder last Wednesday. This is the same shoulder that had prior rotator cuff surgery. He initially could not raise his arm above shoulder level or perform motions like opening a refrigerator, but these have gradually improved over the past few days. He still has pain, especially when raising his arm, though it is less severe than at onset. He uses Salonpas patches and hot water , which provide partial relief, and applies the patches in the morning to manage pain with daily activities.  Physical Exam MUSCULOSKELETAL: Left shoulder with age-appropriate degenerative changes. Flexibility and strength are good, except for weakness in the left infraspinatus.  Assessment and Plan Left shoulder pain and suspected infraspinatus strain after fall Possible infraspinatus strain or tear with degenerative changes. Rapid symptom improvement suggests conservative management. - Continue Salon patches for daytime relief. - Avoid cortisone injection. - Reassess in six weeks if symptoms persist.  Follow-Up Instructions: No follow-ups on file.   Orders:  Orders Placed This Encounter  Procedures   XR Shoulder Left   No orders of the defined types were placed in this encounter.     Procedures: No procedures performed   Clinical Data: No additional findings.   Subjective: Chief  Complaint  Patient presents with   Left Shoulder - Pain    HPI  Review of Systems  Constitutional: Negative.   HENT: Negative.    Eyes: Negative.   Respiratory: Negative.    Cardiovascular: Negative.   Gastrointestinal: Negative.   Endocrine: Negative.   Genitourinary: Negative.   Skin: Negative.   Allergic/Immunologic: Negative.   Neurological: Negative.   Hematological: Negative.   Psychiatric/Behavioral: Negative.    All other systems reviewed and are negative.    Objective: Vital Signs: There were no vitals taken for this visit.  Physical Exam Vitals and nursing note reviewed.  Constitutional:      Appearance: He is well-developed.  HENT:     Head: Normocephalic and atraumatic.  Eyes:     Pupils: Pupils are equal, round, and reactive to light.  Pulmonary:     Effort: Pulmonary effort is normal.  Abdominal:     Palpations: Abdomen is soft.  Musculoskeletal:        General: Normal range of motion.     Cervical back: Neck supple.  Skin:    General: Skin is warm.  Neurological:     Mental Status: He is alert and oriented to person, place, and time.  Psychiatric:        Behavior: Behavior normal.        Thought Content: Thought content normal.        Judgment: Judgment normal.     Ortho Exam  Specialty Comments:  No specialty comments available.  Imaging: XR Shoulder Left Result Date: 08/22/2024 X-rays of the left shoulder show no acute findings.  Degenerative spurring of greater tuberosity and acromion.     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 08/24/2023   Degenerative arthritis of metacarpophalangeal joint of middle finger of right hand 08/24/2023   Polycythemia, secondary 09/01/2022   Hereditary hemochromatosis 09/01/2022   Left carpal tunnel syndrome 07/07/2022   Acute thigh pain, right 05/01/2021   CMC (carpometacarpal joint)  dislocation, left, initial encounter 11/12/2020   Post-operative state 08/20/2020   Nontraumatic incomplete tear of left rotator cuff 08/20/2020   AC (acromioclavicular) arthritis 08/20/2020   Complete tear of left rotator cuff 07/09/2020   Osteoarthritis of first carpometacarpal [facility]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 05/07/2020   Osteoarthritis of left AC (acromioclavicular) joint 05/07/2020   Impingement syndrome of left shoulder 02/08/2020   S/P TKR (total knee replacement) using cement, left 10/03/2019   Cellulitis of arm 08/28/2019   Essential hypertension 02/16/2018   Sepsis due to skin infection (HCC) 02/16/2018   Chronic pain 02/16/2018   Cellulitis of left lower extremity    Lumbar radiculopathy, chronic 01/24/2018   Lumbar stenosis with neurogenic claudication 03/23/2017   Hypothyroidism 07/25/2015   Primary osteoarthritis of right knee 07/23/2015   Primary osteoarthritis of knee 07/23/2015   Past Medical History:  Diagnosis Date   Arthritis    knees; left shoulder (04/12/2013)   Asthma    as a child (04/12/2013)   DDD (degenerative disc disease), cervical    DDD (degenerative disc disease), lumbar    Difficult intubation    2012   CERVICAL FUSION    Headache(784.0)    VERAPAMIL  FOR PREVENTION    Hemochromatosis    defective gene (04/12/2013); 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 ago, just gets jittery   Hypothyroidism    Lumbar stenosis    MRSA (methicillin resistant staph aureus) culture positive 09/10/2019   upper arm   OSA (obstructive sleep apnea)    haven't wore mask in years; had OR; still snore (04/12/2013)   Urinary frequency     No family history on file.  Past Surgical History:  Procedure Laterality Date   BACK SURGERY     CARPOMETACARPEL SUSPENSION PLASTY Left 07/19/2020   Procedure: LEFT THUMB CARPOMETACARPAL (CMC)  ARTHROPLASTY;  Surgeon: Jerri Kay HERO, MD;  Location: [facility];  Service: Orthopedics;  Laterality: Left;   CERVICAL FUSION  2002; 2012   CYSTECTOMY     FINGER ARTHROPLASTY Right 08/24/2023   Procedure: RIGHT INDEX/ LONG FINGER METACARPOPHANGEAL JOINT ARTHROPLASTY;  Surgeon: Arlinda Buster, MD;  Location: [facility];  Service: Orthopedics;  Laterality: Right;  Regional block   INGUINAL HERNIA REPAIR Bilateral 1997   KNEE ARTHROSCOPY Left ~ 1997; ~ 2005   LUMBAR FUSION  2007; 04/11/2013   MICROLARYNGOSCOPY N/A 03/07/2021   Procedure: DIRECT MICROLARYNGOSCOPY WITH BIOPSY;  Surgeon: Ethyl Lonni BRAVO, MD;  Location: [facility];  Service: ENT;  Laterality: N/A;   PENILE DEBRIDEMENT  ~ 2011   X 3  FOR MRSA INFECTION     SHOULDER ARTHROSCOPY Left ~ 2003   TONSILLECTOMY  1990's   TOTAL KNEE ARTHROPLASTY Right  07/23/2015   TOTAL KNEE ARTHROPLASTY Right 07/23/2015   Procedure: TOTAL KNEE ARTHROPLASTY;  Surgeon: Maude LELON Right, MD;  Location: [facility];  Service: Orthopedics;  Laterality: Right;   TOTAL KNEE ARTHROPLASTY Left 10/03/2019   Procedure: LEFT TOTAL KNEE ARTHROPLASTY;  Surgeon: Right Maude LELON, MD;  Location: [facility];  Service: Orthopedics;  Laterality: Left;   UVULOPALATOPHARYNGOPLASTY (UPPP)/TONSILLECTOMY/SEPTOPLASTY  1990's   VASECTOMY     Social History   Occupational History   Not on file  Tobacco Use   Smoking status: Former    Current packs/day: 0.00    Average packs/day: 0.5 packs/day for 15.0 years (7.5 ttl pk-yrs)    Types: Cigarettes    Start date: 10/15/1971    Quit date: 10/14/1986    Years since quitting: 37.8   Smokeless tobacco: Never  Vaping Use   Vaping status: Never Used  Substance and Sexual Activity   Alcohol use: Yes    Comment: occasional   Drug use: No   Sexual activity: Yes

## 2024-08-28 DIAGNOSIS — K080 Exfoliation of teeth due to systemic causes: Secondary | ICD-10-CM | POA: Diagnosis not present

## 2024-08-29 DIAGNOSIS — R5383 Other fatigue: Secondary | ICD-10-CM | POA: Diagnosis not present

## 2024-08-29 DIAGNOSIS — R635 Abnormal weight gain: Secondary | ICD-10-CM | POA: Diagnosis not present

## 2024-08-29 DIAGNOSIS — E039 Hypothyroidism, unspecified: Secondary | ICD-10-CM | POA: Diagnosis not present

## 2024-09-05 ENCOUNTER — Encounter: Admitting: Orthopaedic Surgery

## 2024-09-07 ENCOUNTER — Other Ambulatory Visit: Payer: Self-pay

## 2024-09-07 ENCOUNTER — Encounter: Payer: Self-pay | Admitting: Orthopaedic Surgery

## 2024-09-07 DIAGNOSIS — M25512 Pain in left shoulder: Secondary | ICD-10-CM

## 2024-09-07 NOTE — Telephone Encounter (Signed)
 Referral changed to open unit

## 2024-09-07 NOTE — Telephone Encounter (Signed)
 Order made

## 2024-09-07 NOTE — Telephone Encounter (Signed)
 MRI left shoulder please.  Thanks.

## 2024-09-08 ENCOUNTER — Encounter: Payer: Self-pay | Admitting: Orthopaedic Surgery

## 2024-09-12 NOTE — Telephone Encounter (Signed)
 Pt is scheduled at [facility]

## 2024-09-16 ENCOUNTER — Other Ambulatory Visit

## 2024-09-25 ENCOUNTER — Inpatient Hospital Stay

## 2024-09-25 ENCOUNTER — Inpatient Hospital Stay: Attending: Hematology and Oncology

## 2024-09-25 ENCOUNTER — Other Ambulatory Visit: Payer: Self-pay

## 2024-09-25 DIAGNOSIS — D751 Secondary polycythemia: Secondary | ICD-10-CM | POA: Insufficient documentation

## 2024-09-25 DIAGNOSIS — D649 Anemia, unspecified: Secondary | ICD-10-CM | POA: Diagnosis present

## 2024-09-25 LAB — IRON AND IRON BINDING CAPACITY (CC-WL,HP ONLY)
Iron: 108 ug/dL (ref 45–182)
Saturation Ratios: 21 % (ref 17.9–39.5)
TIBC: 514 ug/dL — ABNORMAL HIGH (ref 250–450)
UIBC: 406 ug/dL

## 2024-09-25 LAB — CBC WITH DIFFERENTIAL (CANCER CENTER ONLY)
Abs Immature Granulocytes: 0.08 K/uL — ABNORMAL HIGH (ref 0.00–0.07)
Basophils Absolute: 0 K/uL (ref 0.0–0.1)
Basophils Relative: 1 %
Eosinophils Absolute: 0.2 K/uL (ref 0.0–0.5)
Eosinophils Relative: 2 %
HCT: 44.7 % (ref 39.0–52.0)
Hemoglobin: 15 g/dL (ref 13.0–17.0)
Immature Granulocytes: 1 %
Lymphocytes Relative: 20 %
Lymphs Abs: 1.5 K/uL (ref 0.7–4.0)
MCH: 29.1 pg (ref 26.0–34.0)
MCHC: 33.6 g/dL (ref 30.0–36.0)
MCV: 86.8 fL (ref 80.0–100.0)
Monocytes Absolute: 0.5 K/uL (ref 0.1–1.0)
Monocytes Relative: 7 %
Neutro Abs: 5.1 K/uL (ref 1.7–7.7)
Neutrophils Relative %: 69 %
Platelet Count: 172 K/uL (ref 150–400)
RBC: 5.15 MIL/uL (ref 4.22–5.81)
RDW: 16 % — ABNORMAL HIGH (ref 11.5–15.5)
WBC Count: 7.5 K/uL (ref 4.0–10.5)
nRBC: 0 % (ref 0.0–0.2)

## 2024-09-25 LAB — FERRITIN: Ferritin: 39 ng/mL (ref 24–336)

## 2024-09-25 NOTE — Progress Notes (Signed)
 Hct= 44.7 today, per Dr. Loretha he does not need a phlebotomy today. Pt informed in lobby that he is good to go.

## 2024-09-29 ENCOUNTER — Ambulatory Visit
Admission: RE | Admit: 2024-09-29 | Discharge: 2024-09-29 | Disposition: A | Source: Ambulatory Visit | Attending: Orthopaedic Surgery | Admitting: Orthopaedic Surgery

## 2024-09-29 DIAGNOSIS — M25512 Pain in left shoulder: Secondary | ICD-10-CM

## 2024-10-06 ENCOUNTER — Other Ambulatory Visit

## 2024-10-09 ENCOUNTER — Encounter: Payer: Self-pay | Admitting: Orthopaedic Surgery

## 2024-10-12 ENCOUNTER — Ambulatory Visit: Admitting: Orthopedic Surgery

## 2024-10-12 ENCOUNTER — Other Ambulatory Visit (INDEPENDENT_AMBULATORY_CARE_PROVIDER_SITE_OTHER): Payer: Self-pay

## 2024-10-12 DIAGNOSIS — M79671 Pain in right foot: Secondary | ICD-10-CM

## 2024-10-16 ENCOUNTER — Encounter: Payer: Self-pay | Admitting: Orthopedic Surgery

## 2024-10-16 NOTE — Progress Notes (Signed)
 "  Office Visit Note   Patient: Larry [name]           Date of Birth: 01/28/1951           MRN: [id number] Visit Date: 10/12/2024              Requested by: Verdia Lombard, MD [address] [place],  [place] [address] PCP: Verdia Lombard, MD  Chief Complaint  Patient presents with   Right Foot - Pain      HPI: Discussed the use of AI scribe software for clinical note transcription with the patient, who gave verbal consent to proceed.  History of Present Illness Larry [name] is a 74 year old male with a history of multiple arthritic surgeries who presents with acute right heel pain.  Acute right heel pain began the previous evening around 9:00 PM when he arose to ambulate. After approximately ten steps, he experienced a sudden popping sensation in the plantar aspect of the right heel, immediately followed by significant pain. The pain is localized to the plantar and posterior heel, with increased tenderness on palpation. Walking and toe flexion exacerbate the discomfort. He denies pain in the Achilles region, superior heel, or dorsal foot.  He has been wearing stiff-soled sneakers for support. He takes meloxicam  for chronic pain and inflammation related to prior arthritic surgeries.  Recent right foot radiographs demonstrated calcaneal and Achilles spurs, as well as degenerative changes of the right great toe metatarsophalangeal joint. No fractures were identified.     Assessment & Plan: Visit Diagnoses:  1. Pain in right foot     Plan: Assessment and Plan Assessment & Plan Acute right plantar fascia strain Acute strain likely due to partial tear or sprain at plantar fascia attachment. No fracture or Achilles tendon rupture. Conservative management indicated. - Use stiff-soled sneakers for ambulation. - Apply topical diclofenac (Voltaren gel) up to four times daily.      Follow-Up Instructions: Return if symptoms worsen or fail to  improve.   Ortho Exam  Patient is alert, oriented, no adenopathy, well-dressed, normal affect, normal respiratory effort. Physical Exam MUSCULOSKELETAL: Achilles tendon without palpable defects or tenderness. No tenderness along peroneal or posterior tibial tendons. Calcaneus non-tender to lateral compression. Tenderness at plantar fascia origin. Dorsiflexion to neutral.      Imaging: No results found. No images are attached to the encounter.  Labs: Lab Results  Component Value Date   REPTSTATUS 03/30/2023 FINAL 03/28/2023   GRAMSTAIN  08/27/2019    FEW WBC PRESENT, PREDOMINANTLY PMN FEW GRAM POSITIVE COCCI IN PAIRS IN CLUSTERS Performed at [facility], [address], [place], [place] [address]    CULT >=100,000 COLONIES/mL ESCHERICHIA COLI (A) 03/28/2023   LABORGA ESCHERICHIA COLI (A) 03/28/2023     Lab Results  Component Value Date   ALBUMIN  4.4 08/16/2023   ALBUMIN  3.5 03/28/2023   ALBUMIN  4.2 11/17/2022    No results found for: MG No results found for: VD25OH  No results found for: PREALBUMIN    Latest Ref Rng & Units 09/25/2024    3:05 PM 06/26/2024    2:44 PM 03/24/2024    3:25 PM  CBC EXTENDED  WBC 4.0 - 10.5 K/uL 7.5  6.3  7.5   RBC 4.22 - 5.81 MIL/uL 5.15  4.83  5.24   Hemoglobin 13.0 - 17.0 g/dL 84.9  86.1  84.6   HCT 39.0 - 52.0 % 44.7  41.9  45.7   Platelets 150 -  400 K/uL 172  160  164   NEUT# 1.7 - 7.7 K/uL 5.1  4.3  5.4   Lymph# 0.7 - 4.0 K/uL 1.5  1.4  1.4      There is no height or weight on file to calculate BMI.  Orders:  Orders Placed This Encounter  Procedures   XR Foot 2 Views Right   No orders of the defined types were placed in this encounter.    Procedures: No procedures performed  Clinical Data: No additional findings.  ROS:  All other systems negative, except as noted in the HPI. Review of Systems  Objective: Vital Signs: There were no vitals taken for this visit.  Specialty Comments:  No specialty  comments available.  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 08/24/2023   Degenerative arthritis of metacarpophalangeal joint of middle finger of right hand 08/24/2023   Polycythemia, secondary 09/01/2022   Hereditary hemochromatosis 09/01/2022   Left carpal tunnel syndrome 07/07/2022   Acute thigh pain, right 05/01/2021   CMC (carpometacarpal joint) dislocation, left, initial encounter 11/12/2020   Post-operative state 08/20/2020   Nontraumatic incomplete tear of left rotator cuff 08/20/2020   AC (acromioclavicular) arthritis 08/20/2020   Complete tear of left rotator cuff 07/09/2020   Osteoarthritis of first carpometacarpal [facility]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 05/07/2020   Osteoarthritis of left AC (acromioclavicular) joint 05/07/2020   Impingement syndrome of left shoulder 02/08/2020   S/P TKR (total knee replacement) using cement, left 10/03/2019   Cellulitis of arm 08/28/2019   Essential hypertension 02/16/2018   Sepsis due to skin infection (HCC) 02/16/2018   Chronic pain 02/16/2018   Cellulitis of left lower extremity    Lumbar radiculopathy, chronic 01/24/2018   Lumbar stenosis with neurogenic claudication 03/23/2017   Hypothyroidism 07/25/2015   Primary osteoarthritis of right knee 07/23/2015   Primary osteoarthritis of knee 07/23/2015   Past Medical History:  Diagnosis Date   Arthritis    knees; left shoulder (04/12/2013)   Asthma    as a child (04/12/2013)   DDD (degenerative disc disease), cervical    DDD (degenerative disc disease), lumbar    Difficult intubation    2012   CERVICAL FUSION    Headache(784.0)    VERAPAMIL  FOR PREVENTION    Hemochromatosis    defective gene (04/12/2013); 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 ago,  just gets jittery   Hypothyroidism    Lumbar stenosis    MRSA (methicillin resistant staph aureus) culture positive 09/10/2019   upper arm   OSA (obstructive sleep apnea)    haven't wore mask in years; had OR; still snore (04/12/2013)   Urinary frequency     History reviewed. No pertinent family history.  Past Surgical History:  Procedure Laterality Date   BACK SURGERY     CARPOMETACARPEL SUSPENSION PLASTY Left 07/19/2020   Procedure: LEFT THUMB CARPOMETACARPAL (CMC) ARTHROPLASTY;  Surgeon: Jerri Kay HERO, MD;  Location: [facility];  Service: Orthopedics;  Laterality: Left;   CERVICAL FUSION  2002; 2012   CYSTECTOMY     FINGER ARTHROPLASTY Right 08/24/2023   Procedure: RIGHT INDEX/ LONG FINGER METACARPOPHANGEAL JOINT ARTHROPLASTY;  Surgeon: Arlinda Buster, MD;  Location: [facility];  Service: Orthopedics;  Laterality: Right;  Regional block   INGUINAL HERNIA REPAIR Bilateral 1997   KNEE ARTHROSCOPY Left ~ 1997; ~ 2005   LUMBAR FUSION  2007; 04/11/2013   MICROLARYNGOSCOPY N/A 03/07/2021   Procedure: DIRECT MICROLARYNGOSCOPY WITH BIOPSY;  Surgeon: Ethyl Lonni BRAVO, MD;  Location: [facility];  Service: ENT;  Laterality: N/A;   PENILE DEBRIDEMENT  ~ 2011   X 3  FOR MRSA INFECTION     SHOULDER ARTHROSCOPY Left ~ 2003   TONSILLECTOMY  1990's   TOTAL KNEE ARTHROPLASTY Right 07/23/2015   TOTAL KNEE ARTHROPLASTY Right 07/23/2015   Procedure: TOTAL KNEE ARTHROPLASTY;  Surgeon: Maude LELON Right, MD;  Location: [facility];  Service: Orthopedics;  Laterality: Right;   TOTAL KNEE ARTHROPLASTY Left 10/03/2019   Procedure: LEFT TOTAL KNEE ARTHROPLASTY;  Surgeon: Right Maude LELON, MD;  Location: [facility];  Service: Orthopedics;  Laterality: Left;   UVULOPALATOPHARYNGOPLASTY (UPPP)/TONSILLECTOMY/SEPTOPLASTY  1990's   VASECTOMY     Social History   Occupational History   Not on file  Tobacco Use   Smoking status: Former    Current packs/day: 0.00     Average packs/day: 0.5 packs/day for 15.0 years (7.5 ttl pk-yrs)    Types: Cigarettes    Start date: 10/15/1971    Quit date: 10/14/1986    Years since quitting: 38.0   Smokeless tobacco: Never  Vaping Use   Vaping status: Never Used  Substance and Sexual Activity   Alcohol use: Yes    Comment: occasional   Drug use: No   Sexual activity: Yes         "

## 2024-10-17 ENCOUNTER — Ambulatory Visit: Admitting: Orthopaedic Surgery

## 2024-10-17 DIAGNOSIS — G8929 Other chronic pain: Secondary | ICD-10-CM | POA: Diagnosis not present

## 2024-10-17 DIAGNOSIS — M25512 Pain in left shoulder: Secondary | ICD-10-CM

## 2024-10-17 NOTE — Progress Notes (Signed)
 "  Office Visit Note   Patient: Larry [name]           Date of Birth: 27-Feb-1951           MRN: [id number] Visit Date: 10/17/2024              Requested by: Verdia Lombard, MD [address] [place],  [place] [address] PCP: Verdia Lombard, MD   Assessment & Plan: Visit Diagnoses:  1. Chronic left shoulder pain     Plan: MRI results reviewed with the patient which shows chronic tears of supraspinatus and infraspinatus as well as degenerative changes of the glenohumeral and AC joint.  Based on these findings treatment options were discussed.  His symptoms are tolerable with activity modifications.  Discussed that reverse shoulder replacement would be the definitive treatment but currently with his symptoms that is not warranted.  Will see him back as needed.  Follow-Up Instructions: Return if symptoms worsen or fail to improve.   Orders:  No orders of the defined types were placed in this encounter.  No orders of the defined types were placed in this encounter.     Procedures: No procedures performed   Clinical Data: No additional findings.   Subjective: Chief Complaint  Patient presents with   Left Shoulder - Pain    HPI [name] returns today for MRI review.  Denies any changes in his symptoms. Review of Systems  Constitutional: Negative.   HENT: Negative.    Eyes: Negative.   Respiratory: Negative.    Cardiovascular: Negative.   Gastrointestinal: Negative.   Endocrine: Negative.   Genitourinary: Negative.   Skin: Negative.   Allergic/Immunologic: Negative.   Neurological: Negative.   Hematological: Negative.   Psychiatric/Behavioral: Negative.    All other systems reviewed and are negative.    Objective: Vital Signs: There were no vitals taken for this visit.  Physical Exam Vitals and nursing note reviewed.  Constitutional:      Appearance: He is well-developed.  HENT:     Head: Normocephalic and atraumatic.  Eyes:      Pupils: Pupils are equal, round, and reactive to light.  Pulmonary:     Effort: Pulmonary effort is normal.  Abdominal:     Palpations: Abdomen is soft.  Musculoskeletal:        General: Normal range of motion.     Cervical back: Neck supple.  Skin:    General: Skin is warm.  Neurological:     Mental Status: He is alert and oriented to person, place, and time.  Psychiatric:        Behavior: Behavior normal.        Thought Content: Thought content normal.        Judgment: Judgment normal.     Ortho Exam Exam of the left shoulder is unchanged from prior visit. Specialty Comments:  No specialty comments available.  Imaging: No results found.   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 08/24/2023   Degenerative arthritis of metacarpophalangeal joint of middle finger of right hand 08/24/2023   Polycythemia, secondary 09/01/2022   Hereditary hemochromatosis 09/01/2022   Left carpal tunnel syndrome 07/07/2022   Acute thigh pain, right 05/01/2021   CMC (carpometacarpal joint) dislocation, left, initial encounter 11/12/2020   Post-operative state 08/20/2020   Nontraumatic incomplete tear of left rotator cuff 08/20/2020   AC (acromioclavicular) arthritis 08/20/2020   Complete tear  of left rotator cuff 07/09/2020   Osteoarthritis of first carpometacarpal [facility]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 05/07/2020   Osteoarthritis of left AC (acromioclavicular) joint 05/07/2020   Impingement syndrome of left shoulder 02/08/2020   S/P TKR (total knee replacement) using cement, left 10/03/2019   Cellulitis of arm 08/28/2019   Essential hypertension 02/16/2018   Sepsis due to skin infection (HCC) 02/16/2018   Chronic pain 02/16/2018   Cellulitis of left lower extremity    Lumbar radiculopathy, chronic 01/24/2018   Lumbar stenosis with neurogenic claudication 03/23/2017   Hypothyroidism 07/25/2015    Primary osteoarthritis of right knee 07/23/2015   Primary osteoarthritis of knee 07/23/2015   Past Medical History:  Diagnosis Date   Arthritis    knees; left shoulder (04/12/2013)   Asthma    as a child (04/12/2013)   DDD (degenerative disc disease), cervical    DDD (degenerative disc disease), lumbar    Difficult intubation    2012   CERVICAL FUSION    Headache(784.0)    VERAPAMIL  FOR PREVENTION    Hemochromatosis    defective gene (04/12/2013); 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 ago, just gets jittery   Hypothyroidism    Lumbar stenosis    MRSA (methicillin resistant staph aureus) culture positive 09/10/2019   upper arm   OSA (obstructive sleep apnea)    haven't wore mask in years; had OR; still snore (04/12/2013)   Urinary frequency     No family history on file.  Past Surgical History:  Procedure Laterality Date   BACK SURGERY     CARPOMETACARPEL SUSPENSION PLASTY Left 07/19/2020   Procedure: LEFT THUMB CARPOMETACARPAL (CMC) ARTHROPLASTY;  Surgeon: Jerri Kay HERO, MD;  Location: [facility];  Service: Orthopedics;  Laterality: Left;   CERVICAL FUSION  2002; 2012   CYSTECTOMY     FINGER ARTHROPLASTY Right 08/24/2023   Procedure: RIGHT INDEX/ LONG FINGER METACARPOPHANGEAL JOINT ARTHROPLASTY;  Surgeon: Arlinda Buster, MD;  Location: [facility];  Service: Orthopedics;  Laterality: Right;  Regional block   INGUINAL HERNIA REPAIR Bilateral 1997   KNEE ARTHROSCOPY Left ~ 1997; ~ 2005   LUMBAR FUSION  2007; 04/11/2013   MICROLARYNGOSCOPY N/A 03/07/2021   Procedure: DIRECT MICROLARYNGOSCOPY WITH BIOPSY;  Surgeon: Ethyl Lonni BRAVO, MD;  Location: [facility];  Service: ENT;  Laterality: N/A;   PENILE DEBRIDEMENT  ~ 2011   X 3  FOR MRSA INFECTION     SHOULDER ARTHROSCOPY Left ~ 2003    TONSILLECTOMY  1990's   TOTAL KNEE ARTHROPLASTY Right 07/23/2015   TOTAL KNEE ARTHROPLASTY Right 07/23/2015   Procedure: TOTAL KNEE ARTHROPLASTY;  Surgeon: Maude LELON Right, MD;  Location: [facility] OR;  Service: Orthopedics;  Laterality: Right;   TOTAL KNEE ARTHROPLASTY Left 10/03/2019   Procedure: LEFT TOTAL KNEE ARTHROPLASTY;  Surgeon: Right Maude LELON, MD;  Location: [facility];  Service: Orthopedics;  Laterality: Left;   UVULOPALATOPHARYNGOPLASTY (UPPP)/TONSILLECTOMY/SEPTOPLASTY  1990's   VASECTOMY     Social History   Occupational History   Not on file  Tobacco Use   Smoking status: Former    Current packs/day: 0.00    Average packs/day: 0.5 packs/day for 15.0 years (7.5 ttl pk-yrs)    Types: Cigarettes    Start date: 10/15/1971    Quit  date: 10/14/1986    Years since quitting: 38.0   Smokeless tobacco: Never  Vaping Use   Vaping status: Never Used  Substance and Sexual Activity   Alcohol use: Yes    Comment: occasional   Drug use: No   Sexual activity: Yes        "

## 2024-12-26 ENCOUNTER — Ambulatory Visit

## 2024-12-26 ENCOUNTER — Other Ambulatory Visit

## 2024-12-26 ENCOUNTER — Ambulatory Visit: Admitting: Hematology and Oncology
